# Patient Record
Sex: Female | Born: 1935 | Race: White | Hispanic: No
Health system: Southern US, Community
[De-identification: ages and names within clinical notes are randomized; demographics above are authoritative.]

## PROBLEM LIST (undated history)

## (undated) DIAGNOSIS — K449 Diaphragmatic hernia without obstruction or gangrene: Secondary | ICD-10-CM

## (undated) DIAGNOSIS — K219 Gastro-esophageal reflux disease without esophagitis: Secondary | ICD-10-CM

## (undated) DIAGNOSIS — I219 Acute myocardial infarction, unspecified: Secondary | ICD-10-CM

## (undated) DIAGNOSIS — R011 Cardiac murmur, unspecified: Secondary | ICD-10-CM

## (undated) DIAGNOSIS — I10 Essential (primary) hypertension: Secondary | ICD-10-CM

## (undated) DIAGNOSIS — N189 Chronic kidney disease, unspecified: Secondary | ICD-10-CM

## (undated) DIAGNOSIS — S8290XA Unspecified fracture of unspecified lower leg, initial encounter for closed fracture: Secondary | ICD-10-CM

## (undated) DIAGNOSIS — I639 Cerebral infarction, unspecified: Secondary | ICD-10-CM

## (undated) DIAGNOSIS — K269 Duodenal ulcer, unspecified as acute or chronic, without hemorrhage or perforation: Secondary | ICD-10-CM

## (undated) DIAGNOSIS — K920 Hematemesis: Secondary | ICD-10-CM

## (undated) DIAGNOSIS — K8050 Calculus of bile duct without cholangitis or cholecystitis without obstruction: Secondary | ICD-10-CM

## (undated) DIAGNOSIS — I350 Nonrheumatic aortic (valve) stenosis: Secondary | ICD-10-CM

## (undated) DIAGNOSIS — I071 Rheumatic tricuspid insufficiency: Secondary | ICD-10-CM

## (undated) DIAGNOSIS — I4891 Unspecified atrial fibrillation: Secondary | ICD-10-CM

## (undated) DIAGNOSIS — I340 Nonrheumatic mitral (valve) insufficiency: Secondary | ICD-10-CM

## (undated) HISTORY — DX: Gastro-esophageal reflux disease without esophagitis: K21.9

## (undated) HISTORY — DX: Nonrheumatic aortic (valve) stenosis: I35.0

## (undated) HISTORY — DX: Unspecified fracture of unspecified lower leg, initial encounter for closed fracture: S82.90XA

## (undated) HISTORY — PX: CORONARY ARTERY BYPASS GRAFT: SHX141

## (undated) HISTORY — PX: GALLBLADDER SURGERY: SHX652

## (undated) HISTORY — DX: Unspecified atrial fibrillation: I48.91

## (undated) HISTORY — DX: Nonrheumatic mitral (valve) insufficiency: I34.0

## (undated) HISTORY — PX: EYE SURGERY: SHX253

## (undated) HISTORY — DX: Rheumatic tricuspid insufficiency: I07.1

## (undated) HISTORY — PX: CHOLECYSTECTOMY: SHX55

## (undated) HISTORY — PX: FRACTURE SURGERY: SHX138

## (undated) HISTORY — PX: APPENDECTOMY: SHX54

## (undated) HISTORY — DX: Diaphragmatic hernia without obstruction or gangrene: K44.9

## (undated) HISTORY — PX: CORONARY ANGIOPLASTY: SHX604

## (undated) HISTORY — DX: Hematemesis: K92.0

## (undated) HISTORY — DX: Duodenal ulcer, unspecified as acute or chronic, without hemorrhage or perforation: K26.9

## (undated) HISTORY — DX: Essential (primary) hypertension: I10

## (undated) HISTORY — DX: Calculus of bile duct without cholangitis or cholecystitis without obstruction: K80.50

---

## 2010-12-24 ENCOUNTER — Ambulatory Visit: Payer: Self-pay | Admitting: Internal Medicine

## 2011-01-02 ENCOUNTER — Inpatient Hospital Stay: Payer: Self-pay | Admitting: Internal Medicine

## 2011-01-02 DIAGNOSIS — R0602 Shortness of breath: Secondary | ICD-10-CM

## 2011-01-02 DIAGNOSIS — I4891 Unspecified atrial fibrillation: Secondary | ICD-10-CM

## 2011-01-03 DIAGNOSIS — I359 Nonrheumatic aortic valve disorder, unspecified: Secondary | ICD-10-CM

## 2011-01-14 ENCOUNTER — Encounter: Payer: Self-pay | Admitting: Cardiovascular Disease

## 2011-01-14 ENCOUNTER — Ambulatory Visit (INDEPENDENT_AMBULATORY_CARE_PROVIDER_SITE_OTHER): Payer: Medicare Other | Admitting: Cardiovascular Disease

## 2011-01-14 VITALS — BP 129/81 | HR 93 | Ht 60.0 in | Wt 128.0 lb

## 2011-01-14 DIAGNOSIS — I10 Essential (primary) hypertension: Secondary | ICD-10-CM

## 2011-01-14 DIAGNOSIS — I350 Nonrheumatic aortic (valve) stenosis: Secondary | ICD-10-CM

## 2011-01-14 DIAGNOSIS — R609 Edema, unspecified: Secondary | ICD-10-CM

## 2011-01-14 DIAGNOSIS — I359 Nonrheumatic aortic valve disorder, unspecified: Secondary | ICD-10-CM

## 2011-01-14 DIAGNOSIS — I4891 Unspecified atrial fibrillation: Secondary | ICD-10-CM

## 2011-01-14 MED ORDER — FUROSEMIDE 40 MG PO TABS: 40.0000 mg | ORAL_TABLET | Freq: Every day | ORAL | Status: DC | PRN

## 2011-01-14 MED ORDER — METOPROLOL TARTRATE 25 MG PO TABS: 25.0000 mg | ORAL_TABLET | Freq: Two times a day (BID) | ORAL | Status: DC

## 2011-01-14 NOTE — Assessment & Plan Note (Signed)
Blood pressure is well controlled on today's visit. Metoprolol tartrate was added b.i.d. For heart rate control. We have suggested she watch her blood pressure at home.

## 2011-01-14 NOTE — Assessment & Plan Note (Signed)
Her aortic valve stenosis is probably be predominant cause of her recent atrial fibrillation. She reports that prior to the atrial fibrillation, she was relatively asymptomatic and felt well.

## 2011-01-14 NOTE — Assessment & Plan Note (Signed)
Mild edema is likely secondary to venous insufficiency. We will monitor this as we recently have started a calcium channel blocker which can make her symptoms worse.

## 2011-01-14 NOTE — Assessment & Plan Note (Signed)
Her rate continues to be elevated. We will add metoprolol tartrate 25 mg b.i.d.. We will hold the Lasix for weight less than 125 pounds as her weight continues to drop and she is now getting dizziness. Dizziness could be secondary to poor rate control. If her rate continues to be elevated, we could add low-dose digoxin. We will meet with her again in several weeks' time to discuss possible cardioversion or starting amiodarone to cardiovert.

## 2011-01-14 NOTE — Patient Instructions (Addendum)
Take only one furosemide. Hold the furosemide for weight less then 124 lbs Dont take potassium if you hold your furosemide/LASIX  Please start metoprolol tartrate twice a day Take aleve or advil for headache Try bendryl for sleep  Please call us if you have new issues that need to be addressed before your next appt.  The office will contact you for a follow up Appt. In 2 weeks

## 2011-01-14 NOTE — Progress Notes (Signed)
Patient ID: Dawn Cooper, female    DOB: [birth year], 77 y.o.   MRN: [id number]  HPI Comments: Dawn Cooper is a very pleasant 77 year old woman who presented to the hospital on October 10 with shortness of breath, found to be in atrial fibrillation with RVR, elevated BNP, CT scan showing bilateral moderate sized pleural effusions who was started on rate control and pradaxa Who presents  for follow up in the office.  She states that since her discharge, she feels well, no significant shortness of breath. She does feel palpitations, some dizziness when she walks. She has had fatigue and several pounds of weight loss. She denies any near syncope. She does not sleep well and she reports that this is probably why she is fatigued in the day. She is always had a problem with insomnia. She woke up last night at 3 AM and could not get back to sleep.  Echocardiogram showed ejection fraction 35-45%, normal left ventricular systolic pressures, moderate aortic valve stenosis with mean gradient of 32 mmHg   total cholesterol 132, LDL 82, HDL 40, triglycerides 502, hemoglobin A1c 4.9  CT Scan of the chest showing moderate bilateral pleural effusions with compressive atelectasis in both lower lobes, no PE  EKG shows atrial fibrillation with left bundle branch block, rate 96 beats per minute   Outpatient Encounter Prescriptions as of 01/14/2011  Medication Sig Dispense Refill  . dabigatran (PRADAXA) 150 MG CAPS Take 150 mg by mouth every 12 (twelve) hours.        Marland Kitchen diltiazem (TIAZAC) 120 MG 24 hr capsule Take 120 mg by mouth daily.        . furosemide (LASIX) 40 MG tablet Take 1 tablet (40 mg total) by mouth daily as needed.  30 tablet  6  . lisinopril (PRINIVIL,ZESTRIL) 5 MG tablet Take 5 mg by mouth daily.        . pantoprazole (PROTONIX) 40 MG tablet Take 40 mg by mouth daily.        . potassium chloride (KLOR-CON) 10 MEQ CR tablet Take 10 mEq by mouth daily.        .  furosemide (LASIX) 40 MG tablet Take  40 mg by mouth 2 (two) times daily.          Review of Systems  Constitutional: Positive for fatigue.  HENT: Negative.   Eyes: Negative.   Respiratory: Negative.   Cardiovascular: Positive for palpitations.  Gastrointestinal: Negative.   Musculoskeletal: Negative.   Skin: Negative.   Neurological: Positive for dizziness.       Insomnia  Hematological: Negative.   Psychiatric/Behavioral: Negative.   All other systems reviewed and are negative.    BP 129/81  Pulse 93  Ht 5' (1.524 m)  Wt 128 lb (58.06 kg)  BMI 25.00 kg/m2   Physical Exam  Nursing note and vitals reviewed. Constitutional: She is oriented to person, place, and time. She appears well-developed and well-nourished.  HENT:  Head: Normocephalic.  Nose: Nose normal.  Mouth/Throat: Oropharynx is clear and moist.  Eyes: Conjunctivae are normal. Pupils are equal, round, and reactive to light.  Neck: Normal range of motion. Neck supple. No JVD present.  Cardiovascular: S1 normal, S2 normal and intact distal pulses.  An irregularly irregular rhythm present. Tachycardia present.  Exam reveals no gallop and no friction rub.   Murmur heard.  Crescendo systolic murmur is present with a grade of 2/6  Pulmonary/Chest: Effort normal and breath sounds normal. No respiratory distress. She has no wheezes.  She has no rales. She exhibits no tenderness.  Abdominal: Soft. Bowel sounds are normal. She exhibits no distension. There is no tenderness.  Musculoskeletal: Normal range of motion. She exhibits edema. She exhibits no tenderness.       Trace edema to the low shins bilaterally  Lymphadenopathy:    She has no cervical adenopathy.  Neurological: She is alert and oriented to person, place, and time. Coordination normal.  Skin: Skin is warm and dry. No rash noted. No erythema.  Psychiatric: She has a normal mood and affect. Her behavior is normal. Judgment and thought content normal.         Assessment and Plan

## 2011-01-15 ENCOUNTER — Encounter: Payer: Self-pay | Admitting: Internal Medicine

## 2011-01-15 ENCOUNTER — Ambulatory Visit (INDEPENDENT_AMBULATORY_CARE_PROVIDER_SITE_OTHER): Payer: Medicare Other | Admitting: Internal Medicine

## 2011-01-15 VITALS — BP 136/82 | HR 79 | Temp 97.8°F | Resp 12 | Ht 61.0 in | Wt 130.0 lb

## 2011-01-15 DIAGNOSIS — M2550 Pain in unspecified joint: Secondary | ICD-10-CM

## 2011-01-15 DIAGNOSIS — I4891 Unspecified atrial fibrillation: Secondary | ICD-10-CM

## 2011-01-15 DIAGNOSIS — I10 Essential (primary) hypertension: Secondary | ICD-10-CM

## 2011-01-15 NOTE — Assessment & Plan Note (Signed)
Heart rate is improved today compared with check yesterday at Dr. Windell Hummingbird office. Likely secondary to addition of metoprolol. She is anticoagulated with Pradaxa (additional samples given today).  Plan for her to follow up with Dr. Mariah Milling in 2 weeks. Per his note, may discuss cardioversion or amiodarone.  Pt will call if any worsening palpitations, dyspnea, or fatigue prior to that appointment. Will plan to have her follow up here in 2 months.

## 2011-01-15 NOTE — Progress Notes (Signed)
Subjective:    Patient ID: Dawn Cooper, female    DOB: 1918-07-30, 77 y.o.   MRN: [id number]  HPI 77 year old female presents to establish care. She reports that she has not had a primary care physician in the past. She was recently admitted to [facility] with atrial fibrillation and RVR. She was evaluated by cardiology and was started on several medications to help control her heart rate. She reports that since that admission she has had significant fatigue. She notes that her fatigue seems better this morning. She reports some mild shortness of breath particularly on exertion. She continues to note palpitations. She denies any chest pain.  She complains of chronic arthritic pain in her back. She reports that she sometimes takes Tylenol for this with minimal improvement. She questions if there are other pain medications that she might be able to take.  Outpatient Encounter Prescriptions as of 01/15/2011  Medication Sig Dispense Refill  . dabigatran (PRADAXA) 150 MG CAPS Take 150 mg by mouth every 12 (twelve) hours.        Marland Kitchen diltiazem (CARDIZEM CD) 120 MG 24 hr capsule Take 120 mg by mouth daily.       . furosemide (LASIX) 40 MG tablet Take 1 tablet (40 mg total) by mouth daily as needed.  30 tablet  6  . KLOR-CON M10 10 MEQ tablet Take 10 mEq by mouth daily.       Marland Kitchen lisinopril (PRINIVIL,ZESTRIL) 5 MG tablet Take 5 mg by mouth daily.        . metoprolol tartrate (LOPRESSOR) 25 MG tablet Take 1 tablet (25 mg total) by mouth 2 (two) times daily.  60 tablet  11  . pantoprazole (PROTONIX) 40 MG tablet Take 40 mg by mouth daily.        . potassium chloride (KLOR-CON) 10 MEQ CR tablet Take 10 mEq by mouth daily.           Review of Systems  Constitutional: Positive for fatigue (improved today). Negative for fever, chills, appetite change and unexpected weight change.  HENT: Negative for ear pain, congestion, sore throat, trouble swallowing, neck pain, voice change and  sinus pressure.   Eyes: Negative for visual disturbance.  Respiratory: Positive for shortness of breath. Negative for cough, wheezing and stridor.   Cardiovascular: Positive for palpitations. Negative for chest pain and leg swelling.  Gastrointestinal: Negative for nausea, vomiting, abdominal pain, diarrhea, constipation, blood in stool, abdominal distention and anal bleeding.  Genitourinary: Negative for dysuria and flank pain.  Musculoskeletal: Positive for myalgias, back pain and arthralgias. Negative for gait problem.  Skin: Negative for color change and rash.  Neurological: Negative for dizziness and headaches.  Hematological: Negative for adenopathy. Does not bruise/bleed easily.  Psychiatric/Behavioral: Negative for suicidal ideas, sleep disturbance and dysphoric mood. The patient is not nervous/anxious.    BP 136/82  Pulse 79  Temp(Src) 97.8 F (36.6 C) (Oral)  Resp 12  Ht 5\' 1"  (1.549 m)  Wt 130 lb (58.968 kg)  BMI 24.56 kg/m2  SpO2 97%     Objective:   Physical Exam  Constitutional: She is oriented to person, place, and time. She appears well-developed and well-nourished. No distress.  HENT:  Head: Normocephalic and atraumatic.  Right Ear: External ear normal.  Left Ear: External ear normal.  Nose: Nose normal.  Mouth/Throat: Oropharynx is clear and moist. No oropharyngeal exudate.  Eyes: Conjunctivae are normal. Pupils are equal, round, and reactive to light. Right eye exhibits no discharge. Left  eye exhibits no discharge. No scleral icterus.  Neck: Normal range of motion. Neck supple. No tracheal deviation present. No thyromegaly present.  Cardiovascular: Normal rate and intact distal pulses.  An irregularly irregular rhythm present. Exam reveals no gallop and no friction rub.   Murmur heard.  Systolic murmur is present with a grade of 2/6  Pulmonary/Chest: Effort normal and breath sounds normal. No respiratory distress. She has no wheezes. She has no rales. She  exhibits no tenderness.  Musculoskeletal: Normal range of motion. She exhibits edema (trace to mid lower leg). She exhibits no tenderness.  Lymphadenopathy:    She has no cervical adenopathy.  Neurological: She is alert and oriented to person, place, and time. No cranial nerve deficit. She exhibits normal muscle tone. Coordination normal.  Skin: Skin is warm and dry. No rash noted. She is not diaphoretic. No erythema. No pallor.  Psychiatric: She has a normal mood and affect. Her behavior is normal. Judgment and thought content normal.          Assessment & Plan:

## 2011-01-15 NOTE — Assessment & Plan Note (Signed)
Secondary to osteoarthritis. We discussed use of Vicodin for severe pain, but pt would like to hold off for now given risk of sedation with this medication. We discussed avoidance of nonsteroidal drugs given her use of Pradaxa. She will continue to use Tylenol as needed. She will call or return to clinic if symptoms are worsening.

## 2011-01-15 NOTE — Assessment & Plan Note (Signed)
BP well controlled today. Will continue current medications.

## 2011-01-24 ENCOUNTER — Ambulatory Visit: Payer: Self-pay | Admitting: Internal Medicine

## 2011-01-31 ENCOUNTER — Encounter: Payer: Self-pay | Admitting: Cardiovascular Disease

## 2011-01-31 ENCOUNTER — Ambulatory Visit (INDEPENDENT_AMBULATORY_CARE_PROVIDER_SITE_OTHER): Payer: Medicare Other | Admitting: Cardiovascular Disease

## 2011-01-31 DIAGNOSIS — R0602 Shortness of breath: Secondary | ICD-10-CM

## 2011-01-31 DIAGNOSIS — I4891 Unspecified atrial fibrillation: Secondary | ICD-10-CM

## 2011-01-31 DIAGNOSIS — R079 Chest pain, unspecified: Secondary | ICD-10-CM

## 2011-01-31 DIAGNOSIS — R609 Edema, unspecified: Secondary | ICD-10-CM

## 2011-01-31 DIAGNOSIS — I359 Nonrheumatic aortic valve disorder, unspecified: Secondary | ICD-10-CM

## 2011-01-31 DIAGNOSIS — I10 Essential (primary) hypertension: Secondary | ICD-10-CM

## 2011-01-31 DIAGNOSIS — I350 Nonrheumatic aortic (valve) stenosis: Secondary | ICD-10-CM

## 2011-01-31 NOTE — Assessment & Plan Note (Signed)
Moderate aortic valve stenosis on echocardiogram. We will order a repeat echocardiogram next year.

## 2011-01-31 NOTE — Progress Notes (Signed)
Patient ID: Dawn Cooper, female    DOB: Oct 24, 1918, 77 y.o.   MRN: [id number]  HPI Comments: Ms. Malbrough is a very pleasant 77 year old woman who presented to the hospital on January 02 2011 with shortness of breath, found to be in atrial fibrillation with RVR, elevated BNP, CT scan showing bilateral moderate sized pleural effusions who was started on rate control and pradaxa. She presents for routine followup.  She has now been on anticoagulation for 4 weeks. She currently does not have any symptoms of shortness of breath, though she does have some staggering or a drunk-like feeling. Her balance is not as good as it used to be. She is uncertain if she is deconditioned or if it is one of the medications. She denies any cough, she does sleep well and has no other complaints. No significant bruising.  She has had pain in her left flank for the past night and through this morning. It hurts with movement.  Echocardiogram While she was in atrial fibrillation showed ejection fraction 35-45%, normal left ventricular systolic pressures, moderate aortic valve stenosis with mean gradient of 32 mmHg   total cholesterol 132, LDL 82, HDL 40, triglycerides 502, hemoglobin A1c 4.9  CT Scan of the chest showing moderate bilateral pleural effusions with compressive atelectasis in both lower lobes, no PE  EKG shows atrial fibrillation with left bundle branch block, rate 59 beats per minute   Outpatient Encounter Prescriptions as of 01/31/2011  Medication Sig Dispense Refill  . dabigatran (PRADAXA) 150 MG CAPS Take 150 mg by mouth every 12 (twelve) hours.        Marland Kitchen diltiazem (CARDIZEM CD) 120 MG 24 hr capsule Take 120 mg by mouth daily.       . furosemide (LASIX) 40 MG tablet Take 40 mg by mouth daily.        Marland Kitchen KLOR-CON M10 10 MEQ tablet Take 10 mEq by mouth daily.       Marland Kitchen lisinopril (PRINIVIL,ZESTRIL) 5 MG tablet Take 5 mg by mouth daily.        . metoprolol tartrate (LOPRESSOR) 25 MG tablet Take 1 tablet  (25 mg total) by mouth 2 (two) times daily.  60 tablet  11  . pantoprazole (PROTONIX) 40 MG tablet Take 40 mg by mouth daily.        .  potassium chloride (KLOR-CON) 10 MEQ CR tablet Take 10 mEq by mouth daily.          Review of Systems  HENT: Negative.   Eyes: Negative.   Respiratory: Negative.   Gastrointestinal: Negative.   Musculoskeletal: Positive for gait problem.       Left flank pain  Skin: Negative.   Neurological:       Insomnia  Hematological: Negative.   Psychiatric/Behavioral: Negative.   All other systems reviewed and are negative.    BP 128/82  Pulse 59  Ht 5\' 1"  (1.549 m)  Wt 132 lb (59.875 kg)  BMI 24.94 kg/m2   Physical Exam  Nursing note and vitals reviewed. Constitutional: She is oriented to person, place, and time. She appears well-developed and well-nourished.  HENT:  Head: Normocephalic.  Nose: Nose normal.  Mouth/Throat: Oropharynx is clear and moist.  Eyes: Conjunctivae are normal. Pupils are equal, round, and reactive to light.  Neck: Normal range of motion. Neck supple. No JVD present.  Cardiovascular: S1 normal, S2 normal and intact distal pulses.  An irregularly irregular rhythm present. Tachycardia present.  Exam reveals no gallop and no  friction rub.   Murmur heard.  Crescendo systolic murmur is present with a grade of 2/6  Pulmonary/Chest: Effort normal and breath sounds normal. No respiratory distress. She has no wheezes. She has no rales. She exhibits no tenderness.  Abdominal: Soft. Bowel sounds are normal. She exhibits no distension. There is no tenderness.  Musculoskeletal: Normal range of motion. She exhibits no edema and no tenderness.       Trace edema to the low shins bilaterally  Lymphadenopathy:    She has no cervical adenopathy.  Neurological: She is alert and oriented to person, place, and time. Coordination normal.  Skin: Skin is warm and dry. No rash noted. No erythema.  Psychiatric: She has a normal mood and affect. Her  behavior is normal. Judgment and thought content normal.         Assessment and Plan

## 2011-01-31 NOTE — Patient Instructions (Addendum)
You are doing well. Please decrease lasix to every other day. Take extra lasix for edema or shortness of breath, or weight gain.  Please cut the metoprolol in 1/2 and take twice a day  Please call us if you have new issues that need to be addressed before your next appt.  The office will contact you for a follow up Appt. In 6 months

## 2011-01-31 NOTE — Assessment & Plan Note (Signed)
Blood pressure is well controlled on today's visit. No changes made to the medications. 

## 2011-01-31 NOTE — Assessment & Plan Note (Signed)
We have had a long discussion with her concerning her various treatment options for atrial fibrillation. We have discussed that she could at this time have a cardioversion. She is reluctant to proceed with this as she does feel well with no complaints. We did discuss that she could have this at a later date if she decided that she would like to go through with the procedure in the hospital though there was no guarantee that she would hold in normal sinus rhythm. She might need other antiarrhythmic medications.  We did suggest that if she would like, she could remain in atrial fibrillation as she was asymptomatic. Her rate was relatively well controlled though given her unbalanced gait, we would decrease the metoprolol to one half tab b.i.d..  We did discuss that she could change to warfarin if she would like. She did not want to make any changes at this time.

## 2011-01-31 NOTE — Assessment & Plan Note (Signed)
Edema has improved with improved rate control. I am concerned that she could become dehydrated and we have suggested she take her Lasix every other day.

## 2011-02-01 ENCOUNTER — Other Ambulatory Visit: Payer: Self-pay | Admitting: Cardiovascular Disease

## 2011-02-01 MED ORDER — LISINOPRIL 5 MG PO TABS: 5.0000 mg | ORAL_TABLET | Freq: Every day | ORAL | Status: DC

## 2011-02-01 MED ORDER — DILTIAZEM HCL ER COATED BEADS 120 MG PO CP24: 120.0000 mg | ORAL_CAPSULE | Freq: Every day | ORAL | Status: DC

## 2011-02-01 NOTE — Telephone Encounter (Signed)
NEEDS REFILL CALLED INTO [facility], IF ANY PROBLEMS PLEASE CALL HER DAUGHTER SANDY KING AT [phone]

## 2011-02-01 NOTE — Telephone Encounter (Signed)
Refill sent for lisinopril & diltiazem.

## 2011-02-11 ENCOUNTER — Inpatient Hospital Stay: Payer: Self-pay | Admitting: Internal Medicine

## 2011-02-11 ENCOUNTER — Telehealth: Payer: Self-pay | Admitting: Cardiovascular Disease

## 2011-02-11 NOTE — Telephone Encounter (Signed)
Pt daughter calling stating that she has some black stool. Pt very week. Back hurting. Not eating much. Wanted mother to be seen today explained that he was not here today. Took no meds last night or the am

## 2011-02-11 NOTE — Telephone Encounter (Signed)
Spoke to pt's daughter, Vonna Drafts, she states pt has had black stool for about 1 week now, her BP last night was 86/45, she called EMS but they did not know about her stool and that she was on Pradaxa, per daughter. BP this AM is 100/62, pt c/o pain across middle of back. Pt takes Pradaxa 150 BID, she has not taken AM dose. I told her to hold am dose as well as BP meds, and take mother to ER now, as she needs to be evaluated for GI bleed. Daughter states mother is stable at this time, so she will take her to ER now. Will notify Dr. Mariah Milling.

## 2011-02-12 DIAGNOSIS — Z7901 Long term (current) use of anticoagulants: Secondary | ICD-10-CM

## 2011-02-12 DIAGNOSIS — I4891 Unspecified atrial fibrillation: Secondary | ICD-10-CM

## 2011-02-21 ENCOUNTER — Ambulatory Visit (INDEPENDENT_AMBULATORY_CARE_PROVIDER_SITE_OTHER): Payer: Medicare Other | Admitting: Internal Medicine

## 2011-02-21 ENCOUNTER — Encounter: Payer: Self-pay | Admitting: Internal Medicine

## 2011-02-21 ENCOUNTER — Telehealth: Payer: Self-pay | Admitting: *Deleted

## 2011-02-21 VITALS — BP 132/60 | HR 60 | Temp 97.6°F | Wt 134.0 lb

## 2011-02-21 DIAGNOSIS — R5383 Other fatigue: Secondary | ICD-10-CM

## 2011-02-21 DIAGNOSIS — D510 Vitamin B12 deficiency anemia due to intrinsic factor deficiency: Secondary | ICD-10-CM

## 2011-02-21 DIAGNOSIS — I4891 Unspecified atrial fibrillation: Secondary | ICD-10-CM

## 2011-02-21 DIAGNOSIS — K922 Gastrointestinal hemorrhage, unspecified: Secondary | ICD-10-CM

## 2011-02-21 DIAGNOSIS — E039 Hypothyroidism, unspecified: Secondary | ICD-10-CM

## 2011-02-21 LAB — VITAMIN B12: Vitamin B-12: 111 pg/mL — ABNORMAL LOW (ref 211–911)

## 2011-02-21 LAB — CBC WITH DIFFERENTIAL/PLATELET
Basophils Relative: 0.5 % (ref 0.0–3.0)
Eosinophils Relative: 2.4 % (ref 0.0–5.0)
HCT: 30.5 % — ABNORMAL LOW (ref 36.0–46.0)
Hemoglobin: 10.5 g/dL — ABNORMAL LOW (ref 12.0–15.0)
Lymphs Abs: 1.6 10*3/uL (ref 0.7–4.0)
MCV: 92.3 fl (ref 78.0–100.0)
Monocytes Absolute: 0.5 10*3/uL (ref 0.1–1.0)
Neutro Abs: 2.9 10*3/uL (ref 1.4–7.7)
Platelets: 222 10*3/uL (ref 150.0–400.0)
WBC: 5.2 10*3/uL (ref 4.5–10.5)

## 2011-02-21 LAB — COMPREHENSIVE METABOLIC PANEL
Albumin: 3.5 g/dL (ref 3.5–5.2)
BUN: 18 mg/dL (ref 6–23)
CO2: 26 mEq/L (ref 19–32)
Calcium: 9.6 mg/dL (ref 8.4–10.5)
Chloride: 111 mEq/L (ref 96–112)
Creatinine, Ser: 1.2 mg/dL (ref 0.4–1.2)
GFR: 43.98 mL/min — ABNORMAL LOW (ref >60.00)
Glucose, Bld: 89 mg/dL (ref 70–99)
Potassium: 4.4 mEq/L (ref 3.5–5.1)

## 2011-02-21 MED ORDER — POTASSIUM CHLORIDE CRYS ER 10 MEQ PO TBCR: 10.0000 meq | EXTENDED_RELEASE_TABLET | ORAL | Status: DC

## 2011-02-21 NOTE — Telephone Encounter (Signed)
Caregiver wanted to confirm if pt needed to continue taking protonix bid per hospital d/c instructions. Dr Dan Humphreys agrees that she should and keep f/u w/GI. Caregiver informed. Also needed RF of lasix, done

## 2011-02-21 NOTE — Progress Notes (Signed)
Subjective:    Patient ID: Dawn Cooper, female    DOB: 1918-05-06, 77 y.o.   MRN: [id number]  HPI 77 year old female with a history of atrial fibrillation presents for followup after recent admission for upper GI bleeding. Patient brings discharge summary with her which shows that she was admitted for upper GI bleed secondary to AV malformation.Pradaxa was stopped during that admission. She was started on Protonix twice daily. She was also started on sucralfate. She reports that she has felt fatigued since her discharge from the hospital last Friday. She denies any black or bloody stools. She denies any nausea or vomiting. She denies any chest pain or dyspnea. She reports full compliance with her medications.  Outpatient Encounter Prescriptions as of 02/21/2011  Medication Sig Dispense Refill  . diltiazem (CARDIZEM CD) 120 MG 24 hr capsule Take 1 capsule (120 mg total) by mouth daily.  30 capsule  6  . furosemide (LASIX) 40 MG tablet Take 40 mg by mouth every other day.       Marland Kitchen KLOR-CON M10 10 MEQ tablet Take 10 mEq by mouth every other day.       . lisinopril (PRINIVIL,ZESTRIL) 5 MG tablet Take 1 tablet (5 mg total) by mouth daily.  30 tablet  6  . metoprolol tartrate (LOPRESSOR) 25 MG tablet Take 12.5 mg by mouth 2 (two) times daily.        . pantoprazole (PROTONIX) 40 MG tablet Take 40 mg by mouth daily.        . sucralfate (CARAFATE) 1 GM/10ML suspension Take 1 g by mouth 4 (four) times daily -  before meals and at bedtime.          Review of Systems  Constitutional: Positive for fatigue. Negative for fever, chills, appetite change and unexpected weight change.  HENT: Negative for ear pain, congestion, sore throat, trouble swallowing, neck pain, voice change and sinus pressure.   Eyes: Negative for visual disturbance.  Respiratory: Negative for cough, shortness of breath, wheezing and stridor.   Cardiovascular: Negative for chest pain, palpitations and leg swelling.  Gastrointestinal:  Negative for nausea, vomiting, abdominal pain, diarrhea, constipation, blood in stool, abdominal distention and anal bleeding.  Genitourinary: Negative for dysuria and flank pain.  Musculoskeletal: Negative for myalgias, arthralgias and gait problem.  Skin: Negative for color change and rash.  Neurological: Negative for dizziness and headaches.  Hematological: Negative for adenopathy. Does not bruise/bleed easily.  Psychiatric/Behavioral: Negative for suicidal ideas, sleep disturbance and dysphoric mood. The patient is not nervous/anxious.    BP 132/60  Pulse 60  Temp(Src) 97.6 F (36.4 C) (Oral)  Wt 134 lb (60.782 kg)  SpO2 98%     Objective:   Physical Exam  Constitutional: She is oriented to person, place, and time. She appears well-developed and well-nourished. No distress.  HENT:  Head: Normocephalic and atraumatic.  Right Ear: External ear normal.  Left Ear: External ear normal.  Nose: Nose normal.  Mouth/Throat: Oropharynx is clear and moist. No oropharyngeal exudate.  Eyes: Conjunctivae are normal. Pupils are equal, round, and reactive to light. Right eye exhibits no discharge. Left eye exhibits no discharge. No scleral icterus.  Neck: Normal range of motion. Neck supple. No tracheal deviation present. No thyromegaly present.  Cardiovascular: Normal rate and intact distal pulses.  An irregularly irregular rhythm present. Exam reveals no gallop and no friction rub.   Murmur heard.  Systolic murmur is present with a grade of 2/6  Pulmonary/Chest: Effort normal and breath sounds normal.  No respiratory distress. She has no wheezes. She has no rales. She exhibits no tenderness.  Abdominal: Soft. She exhibits no distension and no mass. There is tenderness (left upper quadrant). There is no rebound and no guarding.  Musculoskeletal: Normal range of motion. She exhibits no edema and no tenderness.  Lymphadenopathy:    She has no cervical adenopathy.  Neurological: She is alert and  oriented to person, place, and time. No cranial nerve deficit. She exhibits normal muscle tone. Coordination normal.  Skin: Skin is warm and dry. No rash noted. She is not diaphoretic. No erythema. There is pallor.  Psychiatric: She has a normal mood and affect. Her behavior is normal. Judgment and thought content normal.          Assessment & Plan:  1. Upper GI bleeding - S/p recent admission. Will request records for review. Will continue Protonix bid. Will check CBC and ferritin with labs today given fatigue. She will call immediately if any black or bloody stools or nausea/vomiting.  2. Fatigue -likely multifactorial. Question whether she may still be anemic. As above, will check CBC and ferritin with labs. We'll also check TSH and B12. She has followup with Dr. Mariah Milling next week. She also has followup with her GI physician in 2 weeks. We will see her back in 3 weeks.  3. Atrial fibrillation - No further anticoagulation given recent UGI bleeding. Rate well controlled with metoprolol. Has follow up with Dr. Mariah Milling next week.

## 2011-02-22 LAB — COMPREHENSIVE METABOLIC PANEL
ALT: 40 U/L — ABNORMAL HIGH (ref 0–35)
AST: 51 U/L — ABNORMAL HIGH (ref 0–37)
Albumin: 3.5 g/dL (ref 3.5–5.2)
BUN: 17 mg/dL (ref 6–23)
Calcium: 9.7 mg/dL (ref 8.4–10.5)
Chloride: 109 mEq/L (ref 96–112)
Potassium: 4.5 mEq/L (ref 3.5–5.1)
Sodium: 143 mEq/L (ref 135–145)
Total Protein: 6.9 g/dL (ref 6.0–8.3)

## 2011-02-25 ENCOUNTER — Encounter: Payer: Self-pay | Admitting: Cardiovascular Disease

## 2011-02-25 ENCOUNTER — Ambulatory Visit (INDEPENDENT_AMBULATORY_CARE_PROVIDER_SITE_OTHER): Payer: Medicare Other | Admitting: Cardiovascular Disease

## 2011-02-25 VITALS — BP 122/68 | HR 66 | Ht 61.0 in | Wt 135.2 lb

## 2011-02-25 DIAGNOSIS — K922 Gastrointestinal hemorrhage, unspecified: Secondary | ICD-10-CM

## 2011-02-25 DIAGNOSIS — I10 Essential (primary) hypertension: Secondary | ICD-10-CM

## 2011-02-25 DIAGNOSIS — R609 Edema, unspecified: Secondary | ICD-10-CM

## 2011-02-25 DIAGNOSIS — I4891 Unspecified atrial fibrillation: Secondary | ICD-10-CM

## 2011-02-25 DIAGNOSIS — I350 Nonrheumatic aortic (valve) stenosis: Secondary | ICD-10-CM

## 2011-02-25 DIAGNOSIS — I359 Nonrheumatic aortic valve disorder, unspecified: Secondary | ICD-10-CM

## 2011-02-25 MED ORDER — METOPROLOL TARTRATE 50 MG PO TABS: 50.0000 mg | ORAL_TABLET | Freq: Two times a day (BID) | ORAL | Status: DC

## 2011-02-25 NOTE — Assessment & Plan Note (Signed)
Atrial fibrillation rate is well controlled. She does report having significant edema which I suspect is from the Cardizem. We will stop the Cardizem and increase the metoprolol to 50 mg b.i.d.. We have suggested she monitor her blood pressure and heart rate at home.

## 2011-02-25 NOTE — Assessment & Plan Note (Signed)
I suspect her edema could be in part from the calcium channel blocker. We will hold this and increase the metoprolol and watch her blood pressure

## 2011-02-25 NOTE — Progress Notes (Signed)
Patient ID: Dawn Cooper, female    DOB: 01/04/1919, 77 y.o.   MRN: [id number]  HPI Comments: Dawn Cooper is a very pleasant 77 year old woman With moderate aortic valve stenosis,  who presented to the hospital on January 02 2011 with shortness of breath, found to be in atrial fibrillation with RVR, elevated BNP, CT scan showing bilateral moderate sized pleural effusions who was started on rate control and pradaxa. She refused cardioversion after 4 weeks on anticoagulation. At approximately 6 weeks out, she had a AVM bleed and presented to the hospital with melena and hematocrit of 18. She had been having melena for at least one week and did not go to the hospital.  She had EGD with treatment of her AVM and now presents to the office for routine followup. All anticoagulation was held.   We had discussed the case with Dr. Marva Panda and it was felt that given her risk of further bleeding from additional AVMs and given her age and the extent of her recent bleed, that no further anticoagulation be used. She has followup with Dr. Dan Humphreys and GI later this week.   She  Reports feeling somewhat woozy on occasion after she has been walking. Not usually when she stands up or when sitting. She has not been checking her heart rate or blood pressure at home and she does not know how to use the blood pressure cuff. Otherwise she has no complaints. She does report having edema both legs which is somewhat bothersome to her. Most recent hematocrit is 30.  Echocardiogram While she was in atrial fibrillation showed ejection fraction 35-45%, normal left ventricular systolic pressures, moderate aortic valve stenosis with mean gradient of 32 mmHg   total cholesterol 132, LDL 82, HDL 40, triglycerides 502, hemoglobin A1c 4.9  CT Scan of the chest showing moderate bilateral pleural effusions with compressive atelectasis in both lower lobes, no PE  EKG shows atrial fibrillation with left bundle branch block, rate 66 beats  per minute   Outpatient Encounter Prescriptions as of 02/25/2011  Medication Sig Dispense Refill  . furosemide (LASIX) 40 MG tablet Take 40 mg by mouth every other day.       . lisinopril (PRINIVIL,ZESTRIL) 5 MG tablet Take 1 tablet (5 mg total) by mouth daily.  30 tablet  6  . pantoprazole (PROTONIX) 40 MG tablet Take 40 mg by mouth 2 (two) times daily.       . potassium chloride (KLOR-CON M10) 10 MEQ tablet Take 1 tablet (10 mEq total) by mouth every other day.  45 tablet  1  . sucralfate (CARAFATE) 1 GM/10ML suspension Take 1 g by mouth 4 (four) times daily -  before meals and at bedtime.        Marland Kitchen  diltiazem (CARDIZEM CD) 120 MG 24 hr capsule Take 1 capsule (120 mg total) by mouth daily.  30 capsule  6  .  metoprolol tartrate (LOPRESSOR) 25 MG tablet Take 12.5 mg by mouth 2 (two) times daily.           Review of Systems  Constitutional: Negative.   HENT: Negative.   Eyes: Negative.   Respiratory: Negative.   Cardiovascular: Negative.   Gastrointestinal: Negative.   Musculoskeletal: Positive for gait problem.       Left flank pain  Skin: Negative.   Neurological: Negative.        Occasionally has symptoms of "wooziness" when she walks  Hematological: Negative.   Psychiatric/Behavioral: Negative.   All other systems  reviewed and are negative.    BP 122/68  Pulse 66  Ht 5\' 1"  (1.549 m)  Wt 135 lb 4 oz (61.349 kg)  BMI 25.56 kg/m2   Physical Exam  Nursing note and vitals reviewed. Constitutional: She is oriented to person, place, and time. She appears well-developed and well-nourished.  HENT:  Head: Normocephalic.  Nose: Nose normal.  Mouth/Throat: Oropharynx is clear and moist.  Eyes: Conjunctivae are normal. Pupils are equal, round, and reactive to light.  Neck: Normal range of motion. Neck supple. No JVD present.  Cardiovascular: S1 normal, S2 normal and intact distal pulses.  An irregularly irregular rhythm present. Exam reveals no gallop and no friction rub.     Murmur heard.  Crescendo systolic murmur is present with a grade of 2/6  Pulmonary/Chest: Effort normal and breath sounds normal. No respiratory distress. She has no wheezes. She has no rales. She exhibits no tenderness.  Abdominal: Soft. Bowel sounds are normal. She exhibits no distension. There is no tenderness.  Musculoskeletal: Normal range of motion. She exhibits edema. She exhibits no tenderness.       Trace edema to the low shins bilaterally  Lymphadenopathy:    She has no cervical adenopathy.  Neurological: She is alert and oriented to person, place, and time. Coordination normal.  Skin: Skin is warm and dry. No rash noted. No erythema.  Psychiatric: She has a normal mood and affect. Her behavior is normal. Judgment and thought content normal.         Assessment and Plan

## 2011-02-25 NOTE — Assessment & Plan Note (Signed)
Moderate aortic valve stenosis is likely contributor to her atrial fibrillation. She does not appear to be very symptomatic. No further workup at this time. Would probably recommend an echocardiogram next year 2013.

## 2011-02-25 NOTE — Patient Instructions (Addendum)
Please hold the diltiazem  Increase the metoprolol to 50 mg twice a day  Please monitor your  pressure and heart rate at home  Please call us if you have new issues that need to be addressed before your next appt.  The office will contact you for a follow up Appt. In 2 months

## 2011-02-25 NOTE — Assessment & Plan Note (Addendum)
We will hold anticoagulation given her significant AVM bleed. She is aware of the risk and benefit of not being on anticoagulation. She is worried about a head bleed if she resumes anticoagulation.

## 2011-02-25 NOTE — Assessment & Plan Note (Signed)
Blood pressure is well-controlled. We will monitor this after the above medication changes.

## 2011-02-26 ENCOUNTER — Ambulatory Visit (INDEPENDENT_AMBULATORY_CARE_PROVIDER_SITE_OTHER): Payer: Medicare Other | Admitting: *Deleted

## 2011-02-26 DIAGNOSIS — E538 Deficiency of other specified B group vitamins: Secondary | ICD-10-CM

## 2011-02-26 MED ORDER — CYANOCOBALAMIN 1000 MCG/ML IJ SOLN
1000.0000 ug | Freq: Once | INTRAMUSCULAR | Status: AC
  Administered 2011-02-26: 1000 ug via INTRAMUSCULAR

## 2011-02-28 ENCOUNTER — Telehealth: Payer: Self-pay | Admitting: Internal Medicine

## 2011-02-28 DIAGNOSIS — D649 Anemia, unspecified: Secondary | ICD-10-CM

## 2011-02-28 NOTE — Telephone Encounter (Signed)
Needs to come in and have labs drawn either this week or next.

## 2011-02-28 NOTE — Telephone Encounter (Signed)
Spoke w/pt - she is already scheduled for tomorrow

## 2011-03-01 ENCOUNTER — Other Ambulatory Visit (INDEPENDENT_AMBULATORY_CARE_PROVIDER_SITE_OTHER): Payer: Medicare Other | Admitting: *Deleted

## 2011-03-01 DIAGNOSIS — D649 Anemia, unspecified: Secondary | ICD-10-CM

## 2011-03-01 LAB — CBC WITH DIFFERENTIAL/PLATELET
Basophils Relative: 0.4 % (ref 0.0–3.0)
Eosinophils Relative: 3.2 % (ref 0.0–5.0)
HCT: 31 % — ABNORMAL LOW (ref 36.0–46.0)
Lymphs Abs: 1.9 10*3/uL (ref 0.7–4.0)
MCV: 93.3 fl (ref 78.0–100.0)
Monocytes Absolute: 0.4 10*3/uL (ref 0.1–1.0)
Monocytes Relative: 7.8 % (ref 3.0–12.0)
Neutrophils Relative %: 50.9 % (ref 43.0–77.0)
RBC: 3.32 Mil/uL — ABNORMAL LOW (ref 3.87–5.11)
WBC: 5 10*3/uL (ref 4.5–10.5)

## 2011-03-02 LAB — IRON AND TIBC: %SAT: 22 % (ref 20–55)

## 2011-03-05 ENCOUNTER — Ambulatory Visit (INDEPENDENT_AMBULATORY_CARE_PROVIDER_SITE_OTHER): Payer: Medicare Other | Admitting: *Deleted

## 2011-03-05 DIAGNOSIS — E538 Deficiency of other specified B group vitamins: Secondary | ICD-10-CM

## 2011-03-05 MED ORDER — CYANOCOBALAMIN 1000 MCG/ML IJ SOLN
1000.0000 ug | Freq: Once | INTRAMUSCULAR | Status: AC
  Administered 2011-03-05: 1000 ug via INTRAMUSCULAR

## 2011-03-13 ENCOUNTER — Encounter: Payer: Self-pay | Admitting: Internal Medicine

## 2011-03-13 ENCOUNTER — Ambulatory Visit (INDEPENDENT_AMBULATORY_CARE_PROVIDER_SITE_OTHER): Payer: Medicare Other | Admitting: Internal Medicine

## 2011-03-13 VITALS — BP 112/74 | HR 68 | Temp 98.1°F | Resp 14 | Ht 60.0 in | Wt 139.0 lb

## 2011-03-13 DIAGNOSIS — I4891 Unspecified atrial fibrillation: Secondary | ICD-10-CM

## 2011-03-13 DIAGNOSIS — D510 Vitamin B12 deficiency anemia due to intrinsic factor deficiency: Secondary | ICD-10-CM | POA: Insufficient documentation

## 2011-03-13 DIAGNOSIS — K922 Gastrointestinal hemorrhage, unspecified: Secondary | ICD-10-CM

## 2011-03-13 DIAGNOSIS — R609 Edema, unspecified: Secondary | ICD-10-CM

## 2011-03-13 LAB — BASIC METABOLIC PANEL
CO2: 28 mEq/L (ref 19–32)
Calcium: 9.3 mg/dL (ref 8.4–10.5)
Chloride: 110 mEq/L (ref 96–112)
Glucose, Bld: 88 mg/dL (ref 70–99)
Sodium: 143 mEq/L (ref 135–145)

## 2011-03-13 MED ORDER — FUROSEMIDE 20 MG PO TABS: 20.0000 mg | ORAL_TABLET | ORAL | Status: DC

## 2011-03-13 NOTE — Progress Notes (Signed)
Subjective:    Patient ID: Dawn Cooper, female    DOB: 1918-04-21, 77 y.o.   MRN: [id number]  HPI 77YO female with atrial fibrillation and recent AVM with hemorrhage presents for follow up. Overall doing well. Notes some persistent fatigue. Denies dyspnea, palpitations, or chest pain. Notes persistent lower extremity edema despite d/c cardizem.  No pain in LE. No further GI bleeding or black stools. No N/V/D.  Outpatient Encounter Prescriptions as of 03/13/2011  Medication Sig Dispense Refill  . furosemide (LASIX) 20 MG tablet Take 1 tablet (20 mg total) by mouth every other day.  30 tablet  3  . lisinopril (PRINIVIL,ZESTRIL) 5 MG tablet Take 1 tablet (5 mg total) by mouth daily.  30 tablet  6  . metoprolol tartrate (LOPRESSOR) 50 MG tablet Take 1 tablet (50 mg total) by mouth 2 (two) times daily.  180 tablet  4  . pantoprazole (PROTONIX) 40 MG tablet Take 40 mg by mouth 2 (two) times daily.       . potassium chloride (KLOR-CON M10) 10 MEQ tablet Take 1 tablet (10 mEq total) by mouth every other day.  45 tablet  1  . sucralfate (CARAFATE) 1 GM/10ML suspension Take 1 g by mouth 4 (four) times daily -  before meals and at bedtime.          Review of Systems  Constitutional: Positive for fatigue. Negative for fever, chills, appetite change and unexpected weight change.  HENT: Negative for ear pain, congestion, sore throat, trouble swallowing, neck pain, voice change and sinus pressure.   Eyes: Negative for visual disturbance.  Respiratory: Negative for cough, shortness of breath, wheezing and stridor.   Cardiovascular: Positive for leg swelling. Negative for chest pain and palpitations.  Gastrointestinal: Negative for nausea, vomiting, abdominal pain, diarrhea, constipation, blood in stool, abdominal distention and anal bleeding.  Genitourinary: Negative for dysuria and flank pain.  Musculoskeletal: Negative for myalgias, arthralgias and gait problem.  Skin: Negative for color change and  rash.  Neurological: Negative for dizziness and headaches.  Hematological: Negative for adenopathy. Does not bruise/bleed easily.  Psychiatric/Behavioral: Negative for suicidal ideas, sleep disturbance and dysphoric mood. The patient is not nervous/anxious.    BP 112/74  Pulse 68  Temp(Src) 98.1 F (36.7 C) (Oral)  Resp 14  Ht 5' (1.524 m)  Wt 139 lb (63.05 kg)  BMI 27.15 kg/m2  SpO2 99%     Objective:   Physical Exam  Constitutional: She is oriented to person, place, and time. She appears well-developed and well-nourished. No distress.  HENT:  Head: Normocephalic and atraumatic.  Right Ear: External ear normal.  Left Ear: External ear normal.  Nose: Nose normal.  Mouth/Throat: Oropharynx is clear and moist. No oropharyngeal exudate.  Eyes: Conjunctivae are normal. Pupils are equal, round, and reactive to light. Right eye exhibits no discharge. Left eye exhibits no discharge. No scleral icterus.  Neck: Normal range of motion. Neck supple. No tracheal deviation present. No thyromegaly present.  Cardiovascular: Normal rate, regular rhythm and intact distal pulses.  Exam reveals no gallop and no friction rub.   Murmur (high pitched, systolic) heard. Pulmonary/Chest: Effort normal. No respiratory distress. She has no wheezes. She has rales (few diffuse). She exhibits no tenderness.  Musculoskeletal: Normal range of motion. She exhibits edema (pitting to mid lower leg). She exhibits no tenderness.  Lymphadenopathy:    She has no cervical adenopathy.  Neurological: She is alert and oriented to person, place, and time. No cranial nerve deficit. She exhibits  normal muscle tone. Coordination normal.  Skin: Skin is warm and dry. No rash noted. She is not diaphoretic. No erythema. No pallor.  Psychiatric: She has a normal mood and affect. Her behavior is normal. Judgment and thought content normal.          Assessment & Plan:

## 2011-03-13 NOTE — Assessment & Plan Note (Signed)
HR is regular today. Off Cardizem, on metoprolol. Not anticoagulated because of AVM and recent bleeding. Will continue to monitor.

## 2011-03-13 NOTE — Assessment & Plan Note (Signed)
Suspect chronic venous hypertension with edema. Will add compression stockings. Will change lasix to 20mg  daily. Check K today.

## 2011-03-13 NOTE — Assessment & Plan Note (Addendum)
Recent hemoglobin stable at 10.5.  Labs forwarded to Dr. Ricki Rodriguez. No further bleeding per pt. Will continue to monitor. Repeat CBC 1 month.

## 2011-03-18 ENCOUNTER — Encounter: Payer: Self-pay | Admitting: Internal Medicine

## 2011-03-26 HISTORY — PX: CATARACT EXTRACTION: SUR2

## 2011-04-17 ENCOUNTER — Ambulatory Visit (INDEPENDENT_AMBULATORY_CARE_PROVIDER_SITE_OTHER): Payer: Medicare Other | Admitting: Internal Medicine

## 2011-04-17 ENCOUNTER — Encounter: Payer: Self-pay | Admitting: Internal Medicine

## 2011-04-17 ENCOUNTER — Telehealth: Payer: Self-pay | Admitting: *Deleted

## 2011-04-17 ENCOUNTER — Telehealth: Payer: Self-pay | Admitting: Internal Medicine

## 2011-04-17 ENCOUNTER — Other Ambulatory Visit: Payer: Self-pay | Admitting: *Deleted

## 2011-04-17 VITALS — BP 168/90 | HR 63 | Temp 97.8°F | Ht 61.5 in | Wt 142.0 lb

## 2011-04-17 DIAGNOSIS — D649 Anemia, unspecified: Secondary | ICD-10-CM | POA: Diagnosis not present

## 2011-04-17 DIAGNOSIS — D510 Vitamin B12 deficiency anemia due to intrinsic factor deficiency: Secondary | ICD-10-CM | POA: Diagnosis not present

## 2011-04-17 DIAGNOSIS — R0989 Other specified symptoms and signs involving the circulatory and respiratory systems: Secondary | ICD-10-CM | POA: Diagnosis not present

## 2011-04-17 DIAGNOSIS — E538 Deficiency of other specified B group vitamins: Secondary | ICD-10-CM

## 2011-04-17 DIAGNOSIS — I509 Heart failure, unspecified: Secondary | ICD-10-CM | POA: Diagnosis not present

## 2011-04-17 DIAGNOSIS — I10 Essential (primary) hypertension: Secondary | ICD-10-CM

## 2011-04-17 DIAGNOSIS — R0609 Other forms of dyspnea: Secondary | ICD-10-CM | POA: Diagnosis not present

## 2011-04-17 DIAGNOSIS — R0600 Dyspnea, unspecified: Secondary | ICD-10-CM | POA: Insufficient documentation

## 2011-04-17 DIAGNOSIS — K922 Gastrointestinal hemorrhage, unspecified: Secondary | ICD-10-CM

## 2011-04-17 LAB — COMPREHENSIVE METABOLIC PANEL
ALT: 14 U/L (ref 0–35)
Alkaline Phosphatase: 66 U/L (ref 39–117)
CO2: 27 mEq/L (ref 19–32)
Creatinine, Ser: 1 mg/dL (ref 0.4–1.2)
GFR: 55.3 mL/min — ABNORMAL LOW (ref >60.00)
Glucose, Bld: 129 mg/dL — ABNORMAL HIGH (ref 70–99)
Sodium: 141 mEq/L (ref 135–145)
Total Bilirubin: 0.6 mg/dL (ref 0.3–1.2)
Total Protein: 6.6 g/dL (ref 6.0–8.3)

## 2011-04-17 LAB — CBC WITH DIFFERENTIAL/PLATELET
Basophils Absolute: 0 10*3/uL (ref 0.0–0.1)
Basophils Relative: 0.4 % (ref 0.0–3.0)
Eosinophils Absolute: 0.1 10*3/uL (ref 0.0–0.7)
Hemoglobin: 11 g/dL — ABNORMAL LOW (ref 12.0–15.0)
Lymphocytes Relative: 26.1 % (ref 12.0–46.0)
Lymphs Abs: 1.1 10*3/uL (ref 0.7–4.0)
MCHC: 34.1 g/dL (ref 30.0–36.0)
MCV: 92.8 fl (ref 78.0–100.0)
Monocytes Absolute: 0.4 10*3/uL (ref 0.1–1.0)
Neutro Abs: 2.7 10*3/uL (ref 1.4–7.7)
RBC: 3.49 Mil/uL — ABNORMAL LOW (ref 3.87–5.11)
RDW: 15 % — ABNORMAL HIGH (ref 11.5–14.6)

## 2011-04-17 LAB — BRAIN NATRIURETIC PEPTIDE: Pro B Natriuretic peptide (BNP): 943 pg/mL — ABNORMAL HIGH (ref 0.0–100.0)

## 2011-04-17 LAB — FERRITIN: Ferritin: 35.8 ng/mL (ref 10.0–291.0)

## 2011-04-17 MED ORDER — CYANOCOBALAMIN 1000 MCG/ML IJ SOLN
1000.0000 ug | Freq: Once | INTRAMUSCULAR | Status: AC
  Administered 2011-04-17: 1000 ug via INTRAMUSCULAR

## 2011-04-17 MED ORDER — PANTOPRAZOLE SODIUM 40 MG PO TBEC: 40.0000 mg | DELAYED_RELEASE_TABLET | Freq: Every day | ORAL | Status: DC

## 2011-04-17 NOTE — Assessment & Plan Note (Addendum)
Chronic and stable. Likely multifactorial. Patient has some basilar crackles on exam and lower extremity edema. Suspect element of congestive heart failure. Will check BNP with labs today. Last ECHO showed EF 35-45% in setting of afib. Will increase Lasix to daily. She has also been anemic which may be contributing so will check CBC with labs today. Pt daughter will call if symptoms not improving or if worsening. Would favor repeat CXR if symptoms persist. Follow up 1 month.

## 2011-04-17 NOTE — Telephone Encounter (Signed)
Okay 

## 2011-04-17 NOTE — Telephone Encounter (Signed)
Medications updated, my chart message sent

## 2011-04-17 NOTE — Progress Notes (Signed)
Subjective:    Patient ID: Dawn Cooper, female    DOB: [birth year], 78 y.o.   MRN: [id number]  HPI 78 year old female with a history of atrial fibrillation recent hospitalization for upper GI bleeding presents for followup. Overall, she reports that she's been doing well. She continues to have some fatigue and shortness of breath with exertion. She continues to have cough in the morning. This cough is productive of clear sputum. She denies any fever or chills. She denies any chest pain pr palpitations. She does note some lower extremity edema which has been chronic for her. She has not been wearing her compression stockings.  She denies any further GI bleeding. She has been followed by Dr. Marva Panda and a recent CBC last month was stable.  Outpatient Encounter Prescriptions as of 04/17/2011  Medication Sig Dispense Refill  . furosemide (LASIX) 20 MG tablet Take 1 tablet (20 mg total) by mouth every other day.  30 tablet  3  . lisinopril (PRINIVIL,ZESTRIL) 5 MG tablet Take 1 tablet (5 mg total) by mouth daily.  30 tablet  6  . metoprolol tartrate (LOPRESSOR) 50 MG tablet Take 1 tablet (50 mg total) by mouth 2 (two) times daily.  180 tablet  4  . pantoprazole (PROTONIX) 40 MG tablet Take 40 mg by mouth 2 (two) times daily.       . potassium chloride (KLOR-CON M10) 10 MEQ tablet Take 1 tablet (10 mEq total) by mouth every other day.  45 tablet  1  . sucralfate (CARAFATE) 1 GM/10ML suspension Take 1 g by mouth 4 (four) times daily -  before meals and at bedtime.          Review of Systems  Constitutional: Positive for fatigue. Negative for fever, chills, appetite change and unexpected weight change.  HENT: Negative for ear pain, congestion, sore throat, trouble swallowing, neck pain, voice change and sinus pressure.   Eyes: Negative for visual disturbance.  Respiratory: Positive for cough. Negative for shortness of breath, wheezing and stridor.   Cardiovascular: Negative for chest pain,  palpitations and leg swelling.  Gastrointestinal: Negative for nausea, vomiting, abdominal pain, diarrhea, constipation, blood in stool, abdominal distention and anal bleeding.  Genitourinary: Negative for dysuria and flank pain.  Musculoskeletal: Negative for myalgias, arthralgias and gait problem.  Skin: Negative for color change and rash.  Neurological: Negative for dizziness and headaches.  Hematological: Negative for adenopathy. Does not bruise/bleed easily.  Psychiatric/Behavioral: Negative for suicidal ideas, sleep disturbance and dysphoric mood. The patient is not nervous/anxious.    BP 168/90  Pulse 63  Temp(Src) 97.8 F (36.6 C) (Oral)  Ht 5' 1.5" (1.562 m)  Wt 142 lb (64.411 kg)  BMI 26.40 kg/m2  SpO2 96%     Objective:   Physical Exam  Constitutional: She is oriented to person, place, and time. She appears well-developed and well-nourished. No distress.  HENT:  Head: Normocephalic and atraumatic.  Right Ear: External ear normal.  Left Ear: External ear normal.  Nose: Nose normal.  Mouth/Throat: Oropharynx is clear and moist. No oropharyngeal exudate.  Eyes: Conjunctivae are normal. Pupils are equal, round, and reactive to light. Right eye exhibits no discharge. Left eye exhibits no discharge. No scleral icterus.  Neck: Normal range of motion. Neck supple. No tracheal deviation present. No thyromegaly present.  Cardiovascular: Normal rate and intact distal pulses.  An irregularly irregular rhythm present. Exam reveals no gallop and no friction rub.   Murmur heard. Pulmonary/Chest: Effort normal. No respiratory distress. She has  no wheezes. She has rales (bilateral bases). She exhibits no tenderness.  Musculoskeletal: Normal range of motion. She exhibits edema (1+ to mid lower leg). She exhibits no tenderness.  Lymphadenopathy:    She has no cervical adenopathy.  Neurological: She is alert and oriented to person, place, and time. No cranial nerve deficit. She exhibits  normal muscle tone. Coordination normal.  Skin: Skin is warm and dry. No rash noted. She is not diaphoretic. No erythema. No pallor.  Psychiatric: She has a normal mood and affect. Her behavior is normal. Judgment and thought content normal.          Assessment & Plan:

## 2011-04-17 NOTE — Assessment & Plan Note (Signed)
B12 low on labs. B12 supplementation started. Repeat B12 shot today.

## 2011-04-17 NOTE — Assessment & Plan Note (Signed)
Status post recent hospitalization. Patient reports no further bleeding. Will check CBC with labs today. Followup in 3 months.

## 2011-04-17 NOTE — Assessment & Plan Note (Signed)
Secondary to GI bleeding and pernicious anemia. Will check CBC and B12 level with labs today. Patient will followup in 3 months.

## 2011-04-17 NOTE — Telephone Encounter (Signed)
Spoke w/daughter - please remove carafate from med list and change protonix from 1 bid to 1 qd. OK to update this?

## 2011-04-17 NOTE — Telephone Encounter (Signed)
I forgot to say on my result note that pt ferritin was low indicating iron deficiency. I would like to start pt on Ferrous sulfate 325mg  po tid. I think she was on this before, but I did not see on med list.

## 2011-04-18 ENCOUNTER — Telehealth: Payer: Self-pay | Admitting: *Deleted

## 2011-04-18 MED ORDER — FERROUS SULFATE 325 (65 FE) MG PO TABS: 325.0000 mg | ORAL_TABLET | Freq: Three times a day (TID) | ORAL | Status: DC

## 2011-04-18 NOTE — Telephone Encounter (Signed)
Patient notified- Rx sent to pharmacy. 

## 2011-04-18 NOTE — Telephone Encounter (Signed)
Spoke w/pt and my chart mess sent

## 2011-04-18 NOTE — Telephone Encounter (Signed)
Message copied by Vernie Murders on Thu Apr 18, 2011  1:22 PM ------      Message from: Ronna Polio A      Created: Wed Apr 17, 2011  3:33 PM       Labs show that increased fluid may be contributing to her shortness of breath. I would like her to increase lasix to daily (instead of every other day).  I would like to move her appt up to 2 weeks.

## 2011-04-29 ENCOUNTER — Encounter: Payer: Self-pay | Admitting: Cardiovascular Disease

## 2011-04-29 ENCOUNTER — Ambulatory Visit (INDEPENDENT_AMBULATORY_CARE_PROVIDER_SITE_OTHER): Payer: Medicare Other | Admitting: Cardiovascular Disease

## 2011-04-29 VITALS — BP 162/92 | HR 84 | Ht 61.5 in | Wt 135.0 lb

## 2011-04-29 DIAGNOSIS — I359 Nonrheumatic aortic valve disorder, unspecified: Secondary | ICD-10-CM | POA: Diagnosis not present

## 2011-04-29 DIAGNOSIS — R609 Edema, unspecified: Secondary | ICD-10-CM | POA: Diagnosis not present

## 2011-04-29 DIAGNOSIS — I350 Nonrheumatic aortic (valve) stenosis: Secondary | ICD-10-CM

## 2011-04-29 DIAGNOSIS — I10 Essential (primary) hypertension: Secondary | ICD-10-CM | POA: Diagnosis not present

## 2011-04-29 DIAGNOSIS — I4891 Unspecified atrial fibrillation: Secondary | ICD-10-CM | POA: Diagnosis not present

## 2011-04-29 MED ORDER — LISINOPRIL 20 MG PO TABS: 20.0000 mg | ORAL_TABLET | Freq: Every day | ORAL | Status: DC

## 2011-04-29 NOTE — Assessment & Plan Note (Signed)
Rate is well controlled on metoprolol. Not a candidate for anticoagulation given her significant GI bleed.

## 2011-04-29 NOTE — Assessment & Plan Note (Signed)
Blood pressure is well controlled on today's visit. No changes made to the medications. 

## 2011-04-29 NOTE — Progress Notes (Signed)
Patient ID: Dawn Cooper, female    DOB: [birth year], 78 y.o.   MRN: [id number]  HPI Comments: Dawn Cooper is a very pleasant 78 year old woman With moderate aortic valve stenosis,  who presented to the hospital on January 02 2011 with shortness of breath, found to be in atrial fibrillation with RVR, elevated BNP, CT scan showing bilateral moderate sized pleural effusions who was started on rate control and pradaxa. She refused cardioversion after 4 weeks on anticoagulation. At approximately 6 weeks out, she had a AVM bleed and presented to the hospital with melena and hematocrit of 18. She had been having melena for at least one week and did not go to the hospital.  She had EGD with treatment of her AVM and now presents to the office for routine followup. All anticoagulation was held.   We had discussed the case with Dr. Marva Panda and it was felt that given her risk of further bleeding from additional AVMs and given her age and the extent of her recent bleed, that no further anticoagulation be used. She has followup with Dr. Dan Humphreys and GI later this week.    she has no complaints. She does report having edema both legs which is somewhat bothersome to her. Most recent hematocrit is 30.  Echocardiogram While she was in atrial fibrillation showed ejection fraction 35-45%, normal left ventricular systolic pressures, moderate aortic valve stenosis with mean gradient of 32 mmHg   total cholesterol 132, LDL 82, HDL 40, triglycerides 502, hemoglobin A1c 4.9  CT Scan of the chest showing moderate bilateral pleural effusions with compressive atelectasis in both lower lobes, no PE  EKG shows atrial fibrillation with left bundle branch block, rate 64 beats per minute   Outpatient Encounter Prescriptions as of 02/25/2011  Medication Sig Dispense Refill  . furosemide (LASIX) 40 MG tablet Take 40 mg by mouth every other day.       . lisinopril (PRINIVIL,ZESTRIL) 5 MG tablet Take 1 tablet (5 mg total) by mouth  daily.  30 tablet  6  . pantoprazole (PROTONIX) 40 MG tablet Take 40 mg by mouth 2 (two) times daily.       . potassium chloride (KLOR-CON M10) 10 MEQ tablet Take 1 tablet (10 mEq total) by mouth every other day.  45 tablet  1  . sucralfate (CARAFATE) 1 GM/10ML suspension Take 1 g by mouth 4 (four) times daily -  before meals and at bedtime.        Marland Kitchen  diltiazem (CARDIZEM CD) 120 MG 24 hr capsule Take 1 capsule (120 mg total) by mouth daily.  30 capsule  6  .  metoprolol tartrate (LOPRESSOR) 25 MG tablet Take 12.5 mg by mouth 2 (two) times daily.           Review of Systems  Constitutional: Negative.   HENT: Negative.   Eyes: Negative.   Respiratory: Negative.   Cardiovascular: Negative.   Gastrointestinal: Negative.   Musculoskeletal: Positive for gait problem.       Left flank pain  Skin: Negative.   Neurological: Negative.        Occasionally has symptoms of "wooziness" when she walks  Hematological: Negative.   Psychiatric/Behavioral: Negative.   All other systems reviewed and are negative.    BP 162/92  Pulse 84  Ht 5' 1.5" (1.562 m)  Wt 135 lb (61.236 kg)  BMI 25.10 kg/m2   Physical Exam  Nursing note and vitals reviewed. Constitutional: She is oriented to person, place, and time.  She appears well-developed and well-nourished.  HENT:  Head: Normocephalic.  Nose: Nose normal.  Mouth/Throat: Oropharynx is clear and moist.  Eyes: Conjunctivae are normal. Pupils are equal, round, and reactive to light.  Neck: Normal range of motion. Neck supple. No JVD present.  Cardiovascular: S1 normal, S2 normal and intact distal pulses.  An irregularly irregular rhythm present. Exam reveals no gallop and no friction rub.   Murmur heard.  Crescendo systolic murmur is present with a grade of 2/6  Pulmonary/Chest: Effort normal and breath sounds normal. No respiratory distress. She has no wheezes. She has no rales. She exhibits no tenderness.  Abdominal: Soft. Bowel sounds are normal.  She exhibits no distension. There is no tenderness.  Musculoskeletal: Normal range of motion. She exhibits edema. She exhibits no tenderness.       Trace edema to the low shins bilaterally  Lymphadenopathy:    She has no cervical adenopathy.  Neurological: She is alert and oriented to person, place, and time. Coordination normal.  Skin: Skin is warm and dry. No rash noted. No erythema.  Psychiatric: She has a normal mood and affect. Her behavior is normal. Judgment and thought content normal.         Assessment and Plan

## 2011-04-29 NOTE — Assessment & Plan Note (Signed)
We have encouraged her to continue her diuretics given her edema, given her moderate As and atrial fib.

## 2011-04-29 NOTE — Assessment & Plan Note (Signed)
Given her aortic valve stenosis and arrhythmia, she will need diuretics on a regular basis.  She does not seem particularly bothered by her left ear at this time.

## 2011-04-29 NOTE — Patient Instructions (Signed)
You are doing well. Please start aspirin 81 mg daily Please increase the lisinopril to 20 mg daily Take lasix daily for edema  OK to change lasix back to every other day when there is no edema  Please call us if you have new issues that need to be addressed before your next appt.  Your physician wants you to follow-up in: 6 months.  You will receive a reminder letter in the mail two months in advance. If you don't receive a letter, please call our office to schedule the follow-up appointment.

## 2011-05-13 ENCOUNTER — Other Ambulatory Visit: Payer: Self-pay | Admitting: *Deleted

## 2011-05-13 ENCOUNTER — Encounter: Payer: Self-pay | Admitting: Internal Medicine

## 2011-05-13 MED ORDER — FERROUS SULFATE 325 (65 FE) MG PO TABS: 325.0000 mg | ORAL_TABLET | Freq: Three times a day (TID) | ORAL | Status: DC

## 2011-05-20 ENCOUNTER — Ambulatory Visit (INDEPENDENT_AMBULATORY_CARE_PROVIDER_SITE_OTHER): Payer: Medicare Other | Admitting: *Deleted

## 2011-05-20 DIAGNOSIS — E538 Deficiency of other specified B group vitamins: Secondary | ICD-10-CM | POA: Diagnosis not present

## 2011-05-20 MED ORDER — CYANOCOBALAMIN 1000 MCG/ML IJ SOLN
1000.0000 ug | Freq: Once | INTRAMUSCULAR | Status: AC
  Administered 2011-05-20: 1000 ug via INTRAMUSCULAR

## 2011-06-19 ENCOUNTER — Ambulatory Visit (INDEPENDENT_AMBULATORY_CARE_PROVIDER_SITE_OTHER): Payer: Medicare Other | Admitting: *Deleted

## 2011-06-19 DIAGNOSIS — D510 Vitamin B12 deficiency anemia due to intrinsic factor deficiency: Secondary | ICD-10-CM

## 2011-06-19 MED ORDER — CYANOCOBALAMIN 1000 MCG/ML IJ SOLN
1000.0000 ug | Freq: Once | INTRAMUSCULAR | Status: AC
  Administered 2011-06-19: 1000 ug via INTRAMUSCULAR

## 2011-07-18 ENCOUNTER — Ambulatory Visit (INDEPENDENT_AMBULATORY_CARE_PROVIDER_SITE_OTHER): Payer: Medicare Other | Admitting: Internal Medicine

## 2011-07-18 ENCOUNTER — Encounter: Payer: Self-pay | Admitting: Internal Medicine

## 2011-07-18 VITALS — BP 160/84 | HR 56 | Ht 61.5 in | Wt 137.0 lb

## 2011-07-18 DIAGNOSIS — K219 Gastro-esophageal reflux disease without esophagitis: Secondary | ICD-10-CM

## 2011-07-18 DIAGNOSIS — R609 Edema, unspecified: Secondary | ICD-10-CM

## 2011-07-18 DIAGNOSIS — E538 Deficiency of other specified B group vitamins: Secondary | ICD-10-CM

## 2011-07-18 DIAGNOSIS — I10 Essential (primary) hypertension: Secondary | ICD-10-CM | POA: Diagnosis not present

## 2011-07-18 DIAGNOSIS — D509 Iron deficiency anemia, unspecified: Secondary | ICD-10-CM | POA: Diagnosis not present

## 2011-07-18 DIAGNOSIS — E876 Hypokalemia: Secondary | ICD-10-CM | POA: Diagnosis not present

## 2011-07-18 LAB — CBC WITH DIFFERENTIAL/PLATELET
Basophils Absolute: 0 10*3/uL (ref 0.0–0.1)
HCT: 38 % (ref 36.0–46.0)
Lymphs Abs: 1.6 10*3/uL (ref 0.7–4.0)
Monocytes Relative: 10.5 % (ref 3.0–12.0)
Neutrophils Relative %: 55.7 % (ref 43.0–77.0)
Platelets: 165 10*3/uL (ref 150.0–400.0)
RDW: 14.9 % — ABNORMAL HIGH (ref 11.5–14.6)

## 2011-07-18 LAB — FERRITIN: Ferritin: 112.5 ng/mL (ref 10.0–291.0)

## 2011-07-18 LAB — COMPREHENSIVE METABOLIC PANEL
ALT: 17 U/L (ref 0–35)
AST: 28 U/L (ref 0–37)
Calcium: 9.7 mg/dL (ref 8.4–10.5)
Chloride: 105 mEq/L (ref 96–112)
Creatinine, Ser: 1.1 mg/dL (ref 0.4–1.2)
Sodium: 140 mEq/L (ref 135–145)
Total Bilirubin: 0.6 mg/dL (ref 0.3–1.2)

## 2011-07-18 MED ORDER — PANTOPRAZOLE SODIUM 40 MG PO TBEC: 40.0000 mg | DELAYED_RELEASE_TABLET | Freq: Every day | ORAL | Status: DC

## 2011-07-18 MED ORDER — CYANOCOBALAMIN 1000 MCG/ML IJ SOLN
1000.0000 ug | Freq: Once | INTRAMUSCULAR | Status: AC
  Administered 2011-07-18: 1000 ug via INTRAMUSCULAR

## 2011-07-18 MED ORDER — POTASSIUM CHLORIDE CRYS ER 10 MEQ PO TBCR: 10.0000 meq | EXTENDED_RELEASE_TABLET | Freq: Every day | ORAL | Status: DC

## 2011-07-18 NOTE — Assessment & Plan Note (Signed)
Secondary to blood loss from GI ulcer. Hgb improving on last labs. Symptomatically improving with increased energy. Will recheck CBC and ferritin today.

## 2011-07-18 NOTE — Assessment & Plan Note (Signed)
Currently well controlled with qod use of lasix. Will check electrolytes and renal function with labs today.

## 2011-07-18 NOTE — Assessment & Plan Note (Signed)
With recent GI ulceration. Symptoms currently well controlled. Will continue protonix.

## 2011-07-18 NOTE — Assessment & Plan Note (Signed)
BP slightly elevated today, but has historically been well controlled. Will continue to monitor. Continue current medications. Follow up 3 months.

## 2011-07-18 NOTE — Assessment & Plan Note (Signed)
Secondary to diuretic use. Will continue supplemental K. Recheck labs today.

## 2011-07-18 NOTE — Progress Notes (Signed)
Subjective:    Patient ID: Dawn Cooper, female    DOB: Dec 04, 1918, 78 y.o.   MRN: [id number]  HPI 78 year old female with history of atrial fibrillation, upper GI bleeding presents for followup. Patient reports that her energy level is gradually improving. She denies any shortness of breath, chest pain, palpitations. She continues to take iron supplementation. She denies any recent nausea, vomiting, change in bowels. She does note some black stools but attributes this to iron use. She denies any abdominal pain.    Outpatient Encounter Prescriptions as of 07/18/2011  Medication Sig Dispense Refill  . ferrous sulfate 325 (65 FE) MG tablet Take 1 tablet (325 mg total) by mouth 3 (three) times daily with meals.  90 tablet  3  . furosemide (LASIX) 20 MG tablet Take 1 tablet (20 mg total) by mouth every other day.  30 tablet  3  . lisinopril (PRINIVIL,ZESTRIL) 20 MG tablet Take 1 tablet (20 mg total) by mouth daily.  30 tablet  6  . metoprolol tartrate (LOPRESSOR) 50 MG tablet Take 1 tablet (50 mg total) by mouth 2 (two) times daily.  180 tablet  4  . pantoprazole (PROTONIX) 40 MG tablet Take 1 tablet (40 mg total) by mouth daily.  90 tablet  3  . potassium chloride (KLOR-CON M10) 10 MEQ tablet Take 1 tablet (10 mEq total) by mouth daily.  90 tablet  1  . DISCONTD: potassium chloride (KLOR-CON M10) 10 MEQ tablet Take 1 tablet (10 mEq total) by mouth every other day.  45 tablet  1    BP 160/84  Pulse 56  Ht 5' 1.5" (1.562 m)  Wt 137 lb (62.143 kg)  BMI 25.47 kg/m2  SpO2 97%  Review of Systems  Constitutional: Negative for fever, chills, appetite change, fatigue and unexpected weight change.  HENT: Negative for ear pain, congestion, sore throat, trouble swallowing, neck pain, voice change and sinus pressure.   Eyes: Negative for visual disturbance.  Respiratory: Negative for cough, shortness of breath, wheezing and stridor.   Cardiovascular: Negative for chest pain, palpitations and leg  swelling.  Gastrointestinal: Negative for nausea, vomiting, abdominal pain, diarrhea, constipation, blood in stool, abdominal distention and anal bleeding.  Genitourinary: Negative for dysuria and flank pain.  Musculoskeletal: Negative for myalgias, arthralgias and gait problem.  Skin: Negative for color change and rash.  Neurological: Negative for dizziness and headaches.  Hematological: Negative for adenopathy. Does not bruise/bleed easily.  Psychiatric/Behavioral: Negative for suicidal ideas, sleep disturbance and dysphoric mood. The patient is not nervous/anxious.        Objective:   Physical Exam  Constitutional: She is oriented to person, place, and time. She appears well-developed and well-nourished. No distress.  HENT:  Head: Normocephalic and atraumatic.  Right Ear: External ear normal.  Left Ear: External ear normal.  Nose: Nose normal.  Mouth/Throat: Oropharynx is clear and moist. No oropharyngeal exudate.  Eyes: Conjunctivae are normal. Pupils are equal, round, and reactive to light. Right eye exhibits no discharge. Left eye exhibits no discharge. No scleral icterus.  Neck: Normal range of motion. Neck supple. No tracheal deviation present. No thyromegaly present.  Cardiovascular: Normal rate, normal heart sounds and intact distal pulses.  An irregularly irregular rhythm present. Exam reveals no gallop and no friction rub.   No murmur heard. Pulmonary/Chest: Effort normal and breath sounds normal. No respiratory distress. She has no wheezes. She has no rales. She exhibits no tenderness.  Musculoskeletal: Normal range of motion. She exhibits no edema and no  tenderness.  Lymphadenopathy:    She has no cervical adenopathy.  Neurological: She is alert and oriented to person, place, and time. No cranial nerve deficit. She exhibits normal muscle tone. Coordination normal.  Skin: Skin is warm and dry. No rash noted. She is not diaphoretic. No erythema. No pallor.  Psychiatric: She  has a normal mood and affect. Her behavior is normal. Judgment and thought content normal.          Assessment & Plan:

## 2011-07-19 ENCOUNTER — Telehealth: Payer: Self-pay | Admitting: *Deleted

## 2011-07-19 NOTE — Telephone Encounter (Signed)
error 

## 2011-07-22 ENCOUNTER — Ambulatory Visit: Payer: Medicare Other

## 2011-08-21 ENCOUNTER — Ambulatory Visit (INDEPENDENT_AMBULATORY_CARE_PROVIDER_SITE_OTHER): Payer: Medicare Other | Admitting: *Deleted

## 2011-08-21 DIAGNOSIS — E538 Deficiency of other specified B group vitamins: Secondary | ICD-10-CM

## 2011-08-21 MED ORDER — CYANOCOBALAMIN 1000 MCG/ML IJ SOLN
1000.0000 ug | Freq: Once | INTRAMUSCULAR | Status: AC
  Administered 2011-08-21: 1000 ug via INTRAMUSCULAR

## 2011-09-02 DIAGNOSIS — H2510 Age-related nuclear cataract, unspecified eye: Secondary | ICD-10-CM | POA: Diagnosis not present

## 2011-09-13 ENCOUNTER — Other Ambulatory Visit: Payer: Self-pay | Admitting: *Deleted

## 2011-09-13 DIAGNOSIS — R609 Edema, unspecified: Secondary | ICD-10-CM

## 2011-09-13 DIAGNOSIS — E876 Hypokalemia: Secondary | ICD-10-CM

## 2011-09-13 DIAGNOSIS — K219 Gastro-esophageal reflux disease without esophagitis: Secondary | ICD-10-CM

## 2011-09-13 MED ORDER — LISINOPRIL 20 MG PO TABS: 20.0000 mg | ORAL_TABLET | Freq: Every day | ORAL | Status: DC

## 2011-09-13 MED ORDER — POTASSIUM CHLORIDE CRYS ER 10 MEQ PO TBCR: 10.0000 meq | EXTENDED_RELEASE_TABLET | Freq: Every day | ORAL | Status: DC

## 2011-09-13 MED ORDER — PANTOPRAZOLE SODIUM 40 MG PO TBEC: 40.0000 mg | DELAYED_RELEASE_TABLET | Freq: Every day | ORAL | Status: DC

## 2011-09-13 MED ORDER — METOPROLOL TARTRATE 50 MG PO TABS: 50.0000 mg | ORAL_TABLET | Freq: Two times a day (BID) | ORAL | Status: DC

## 2011-09-13 MED ORDER — FUROSEMIDE 20 MG PO TABS: 20.0000 mg | ORAL_TABLET | ORAL | Status: DC

## 2011-09-13 NOTE — Telephone Encounter (Signed)
Refilled Metoprolol and Lisinopril.

## 2011-09-23 ENCOUNTER — Ambulatory Visit (INDEPENDENT_AMBULATORY_CARE_PROVIDER_SITE_OTHER): Payer: Medicare Other | Admitting: *Deleted

## 2011-09-23 DIAGNOSIS — E538 Deficiency of other specified B group vitamins: Secondary | ICD-10-CM | POA: Diagnosis not present

## 2011-09-23 MED ORDER — CYANOCOBALAMIN 1000 MCG/ML IJ SOLN
1000.0000 ug | Freq: Once | INTRAMUSCULAR | Status: AC
  Administered 2011-09-23: 1000 ug via INTRAMUSCULAR

## 2011-10-16 DIAGNOSIS — H2510 Age-related nuclear cataract, unspecified eye: Secondary | ICD-10-CM | POA: Diagnosis not present

## 2011-10-23 ENCOUNTER — Encounter: Payer: Self-pay | Admitting: Internal Medicine

## 2011-10-23 ENCOUNTER — Ambulatory Visit (INDEPENDENT_AMBULATORY_CARE_PROVIDER_SITE_OTHER): Payer: Medicare Other | Admitting: Internal Medicine

## 2011-10-23 VITALS — BP 168/68 | HR 54 | Temp 97.8°F | Resp 14 | Wt 140.5 lb

## 2011-10-23 DIAGNOSIS — I10 Essential (primary) hypertension: Secondary | ICD-10-CM | POA: Diagnosis not present

## 2011-10-23 DIAGNOSIS — D510 Vitamin B12 deficiency anemia due to intrinsic factor deficiency: Secondary | ICD-10-CM

## 2011-10-23 DIAGNOSIS — R2681 Unsteadiness on feet: Secondary | ICD-10-CM

## 2011-10-23 DIAGNOSIS — E538 Deficiency of other specified B group vitamins: Secondary | ICD-10-CM | POA: Diagnosis not present

## 2011-10-23 DIAGNOSIS — R269 Unspecified abnormalities of gait and mobility: Secondary | ICD-10-CM

## 2011-10-23 DIAGNOSIS — D509 Iron deficiency anemia, unspecified: Secondary | ICD-10-CM

## 2011-10-23 MED ORDER — CYANOCOBALAMIN 1000 MCG/ML IJ SOLN
1000.0000 ug | Freq: Once | INTRAMUSCULAR | Status: AC
  Administered 2011-10-23: 1000 ug via INTRAMUSCULAR

## 2011-10-23 NOTE — Progress Notes (Signed)
Subjective:    Patient ID: Dawn Cooper, female    DOB: Oct 04, 1918, 77 y.o.   MRN: [id number]  HPI 78YO female with HTN, h/o gastric ulcer with hemorrhage, iron def anemia presents for follow up. Doing generally well. Has some gait instability, but refuses to use cane or Dawn Cooper. This has been unchanged for several months. Notes that BP is well controlled at home. Did not bring record of BP today. No palpitations, chest pain, dyspnea. Good appetite.  Reports full compliance with medications.  Outpatient Encounter Prescriptions as of 10/23/2011  Medication Sig Dispense Refill  . furosemide (LASIX) 20 MG tablet Take 1 tablet (20 mg total) by mouth every other day.  90 tablet  3  . lisinopril (PRINIVIL,ZESTRIL) 20 MG tablet Take 1 tablet (20 mg total) by mouth daily.  30 tablet  6  . metoprolol (LOPRESSOR) 50 MG tablet Take 1 tablet (50 mg total) by mouth 2 (two) times daily.  180 tablet  3  . pantoprazole (PROTONIX) 40 MG tablet Take 1 tablet (40 mg total) by mouth daily.  90 tablet  3  . potassium chloride (KLOR-CON M10) 10 MEQ tablet Take 1 tablet (10 mEq total) by mouth daily.  90 tablet  3  . DISCONTD: ferrous sulfate 325 (65 FE) MG tablet Take 1 tablet (325 mg total) by mouth 3 (three) times daily with meals.  90 tablet  3   Facility-Administered Encounter Medications as of 10/23/2011  Medication Dose Route Frequency Provider Last Rate Last Dose  . cyanocobalamin ((VITAMIN B-12)) injection 1,000 mcg  1,000 mcg Intramuscular Once Sherlene Shams, MD   1,000 mcg at 10/23/11 0846   BP 168/68  Pulse 54  Temp 97.8 F (36.6 C) (Oral)  Resp 14  Wt 140 lb 8 oz (63.73 kg)  SpO2 97%  Review of Systems  Constitutional: Negative for fever, chills, appetite change, fatigue and unexpected weight change.  HENT: Negative for ear pain, congestion, sore throat, trouble swallowing, neck pain, voice change and sinus pressure.   Eyes: Negative for visual disturbance.  Respiratory: Negative for cough,  shortness of breath, wheezing and stridor.   Cardiovascular: Negative for chest pain, palpitations and leg swelling.  Gastrointestinal: Negative for nausea, vomiting, abdominal pain, diarrhea, constipation, blood in stool, abdominal distention and anal bleeding.  Genitourinary: Negative for dysuria and flank pain.  Musculoskeletal: Positive for gait problem. Negative for myalgias and arthralgias.  Skin: Negative for color change and rash.  Neurological: Positive for light-headedness. Negative for dizziness and headaches.  Hematological: Negative for adenopathy. Does not bruise/bleed easily.  Psychiatric/Behavioral: Negative for suicidal ideas, disturbed wake/sleep cycle and dysphoric mood. The patient is not nervous/anxious.        Objective:   Physical Exam  Constitutional: She is oriented to person, place, and time. She appears well-developed and well-nourished. No distress.  HENT:  Head: Normocephalic and atraumatic.  Right Ear: External ear normal.  Left Ear: External ear normal.  Nose: Nose normal.  Mouth/Throat: Oropharynx is clear and moist. No oropharyngeal exudate.  Eyes: Conjunctivae are normal. Pupils are equal, round, and reactive to light. Right eye exhibits no discharge. Left eye exhibits no discharge. No scleral icterus.  Neck: Normal range of motion. Neck supple. No tracheal deviation present. No thyromegaly present.  Cardiovascular: Normal rate, regular rhythm, normal heart sounds and intact distal pulses.  Exam reveals no gallop and no friction rub.   No murmur heard. Pulmonary/Chest: Effort normal and breath sounds normal. No respiratory distress. She has no wheezes.  She has no rales. She exhibits no tenderness.  Musculoskeletal: Normal range of motion. She exhibits no edema and no tenderness.  Lymphadenopathy:    She has no cervical adenopathy.  Neurological: She is alert and oriented to person, place, and time. No cranial nerve deficit. She exhibits normal muscle  tone. Coordination normal.  Skin: Skin is warm and dry. No rash noted. She is not diaphoretic. No erythema. No pallor.  Psychiatric: She has a normal mood and affect. Her behavior is normal. Judgment and thought content normal.          Assessment & Plan:

## 2011-10-23 NOTE — Assessment & Plan Note (Signed)
Labs in 06/2011 showed marked improvement in Hgb and ferritin. Will repeat 12/2011.

## 2011-10-23 NOTE — Assessment & Plan Note (Signed)
B12 shot today. 

## 2011-10-23 NOTE — Assessment & Plan Note (Signed)
BP elevated initially today, then improved on repeat. Has been well controlled at home. Will continue current medications. Repeat renal function in 3 months.

## 2011-10-23 NOTE — Assessment & Plan Note (Signed)
Chronic.  Offerred to set up vestibular therapy, but pt would like to hold off for now. Will continue to monitor.

## 2011-10-29 DIAGNOSIS — H2510 Age-related nuclear cataract, unspecified eye: Secondary | ICD-10-CM | POA: Diagnosis not present

## 2011-10-29 DIAGNOSIS — H269 Unspecified cataract: Secondary | ICD-10-CM | POA: Diagnosis not present

## 2011-10-29 DIAGNOSIS — H259 Unspecified age-related cataract: Secondary | ICD-10-CM | POA: Diagnosis not present

## 2011-11-06 ENCOUNTER — Ambulatory Visit: Payer: Medicare Other | Admitting: Cardiovascular Disease

## 2011-11-08 ENCOUNTER — Ambulatory Visit (INDEPENDENT_AMBULATORY_CARE_PROVIDER_SITE_OTHER): Payer: Medicare Other | Admitting: Cardiovascular Disease

## 2011-11-08 ENCOUNTER — Encounter: Payer: Self-pay | Admitting: Cardiovascular Disease

## 2011-11-08 VITALS — BP 138/86 | HR 48 | Ht 64.0 in | Wt 142.4 lb

## 2011-11-08 DIAGNOSIS — I350 Nonrheumatic aortic (valve) stenosis: Secondary | ICD-10-CM

## 2011-11-08 DIAGNOSIS — I10 Essential (primary) hypertension: Secondary | ICD-10-CM | POA: Diagnosis not present

## 2011-11-08 DIAGNOSIS — I4891 Unspecified atrial fibrillation: Secondary | ICD-10-CM

## 2011-11-08 DIAGNOSIS — R609 Edema, unspecified: Secondary | ICD-10-CM | POA: Diagnosis not present

## 2011-11-08 DIAGNOSIS — I359 Nonrheumatic aortic valve disorder, unspecified: Secondary | ICD-10-CM

## 2011-11-08 MED ORDER — LISINOPRIL 40 MG PO TABS: 40.0000 mg | ORAL_TABLET | Freq: Every day | ORAL | Status: DC

## 2011-11-08 NOTE — Assessment & Plan Note (Signed)
Blood pressure was very elevated on today's visit. She does report and have documentation of good blood pressures at home in the 110-130 range. Blood pressure was brought in today and is consistent with my measurements taken manually. We have asked her to continue to monitor her blood pressure at home. We'll decrease her metoprolol for bradycardia, increase her lisinopril to 40 mg daily.

## 2011-11-08 NOTE — Patient Instructions (Addendum)
Please cut the metoprolol in 1/2 and take 25 mg twice a day Please increase the lisinopril to 40 mg daily   Please call us if you have new issues that need to be addressed before your next appt.  Your physician wants you to follow-up in: 6 months.  You will receive a reminder letter in the mail two months in advance. If you don't receive a letter, please call our office to schedule the follow-up appointment.

## 2011-11-08 NOTE — Assessment & Plan Note (Signed)
Edema is nonpitting. Likely secondary to venous insufficiency.

## 2011-11-08 NOTE — Progress Notes (Signed)
Patient ID: Dawn Cooper, female    DOB: December 22, 1918, 78 y.o.   MRN: [id number]  HPI Comments: Dawn Cooper is a very pleasant 78 year old woman with moderate aortic valve stenosis,  who presented to the hospital on January 02 2011 with shortness of breath, found to be in atrial fibrillation with RVR, elevated BNP, CT scan showing bilateral moderate sized pleural effusions who was started on rate control and pradaxa. She refused cardioversion after 4 weeks on anticoagulation. At approximately 6 weeks out, she had a AVM bleed and presented to the hospital with melena and hematocrit of 18. She had been having melena for at least one week and did not go to the hospital.  She had EGD with treatment of her AVM and now presents to the office for routine followup. All anticoagulation was held.   We had discussed the case with Dr. Marva Panda and it was felt that given her risk of further bleeding from additional AVMs and given her age and the extent of her recent bleed, that no further anticoagulation be used. She has followup with Dr. Dan Humphreys and GI later this week.   She reports that she has not had any palpitations for some time. She feels well. Occasional lightheadedness when she walks and dizziness when she stands. She had recent cataract surgery on the left. Schedule cataract surgery on the right possibly next month. Otherwise no complaints  Echocardiogram While she was in atrial fibrillation showed ejection fraction 35-45%, normal left ventricular systolic pressures, moderate aortic valve stenosis with mean gradient of 32 mmHg  Previous lab work:  total cholesterol 132, LDL 82, HDL 40, triglycerides 502, hemoglobin A1c 4.9  CT Scan of the chest showing moderate bilateral pleural effusions with compressive atelectasis in both lower lobes, no PE  EKG SHOWS NORMAL SINUS RHYTHM WITH LEFT BUNDLE BRANCH BLOCK with rate 48 beats per minute    Outpatient Encounter Prescriptions as of 11/08/2011  Medication  Sig Dispense Refill  . Cyanocobalamin (VITAMIN B-12 IJ) Inject as directed. Monthly.      . furosemide (LASIX) 20 MG tablet Take 1 tablet (20 mg total) by mouth every other day.  90 tablet  3  . NON FORMULARY Eye Drops 1 drop left eye 3 times daily.      . pantoprazole (PROTONIX) 40 MG tablet Take 1 tablet (40 mg total) by mouth daily.  90 tablet  3  . potassium chloride (KLOR-CON M10) 10 MEQ tablet Take 1 tablet (10 mEq total) by mouth daily.  90 tablet  3  .  lisinopril (PRINIVIL,ZESTRIL) 20 MG tablet Take 1 tablet (20 mg total) by mouth daily.  30 tablet  6  .  metoprolol (LOPRESSOR) 50 MG tablet Take 1 tablet (50 mg total) by mouth 2 (two) times daily.  180 tablet  3     Review of Systems  Constitutional: Negative.   HENT: Negative.   Eyes: Negative.   Respiratory: Negative.   Cardiovascular: Negative.   Gastrointestinal: Negative.   Musculoskeletal: Positive for gait problem.  Skin: Negative.   Neurological: Negative.        Occasionally has symptoms of "wooziness" when she walks  Hematological: Negative.   Psychiatric/Behavioral: Negative.   All other systems reviewed and are negative.    BP 138/86  Pulse 48  Ht 5\' 4"  (1.626 m)  Wt 142 lb 6 oz (64.581 kg)  BMI 24.44 kg/m2  Physical Exam  Nursing note and vitals reviewed. Constitutional: She is oriented to person, place, and time.  She appears well-developed and well-nourished.  HENT:  Head: Normocephalic.  Nose: Nose normal.  Mouth/Throat: Oropharynx is clear and moist.  Eyes: Conjunctivae are normal. Pupils are equal, round, and reactive to light.  Neck: Normal range of motion. Neck supple. No JVD present.  Cardiovascular: Regular rhythm, S1 normal, S2 normal and intact distal pulses.  Bradycardia present.  Exam reveals no gallop and no friction rub.   Murmur heard.  Crescendo systolic murmur is present with a grade of 2/6  Pulmonary/Chest: Effort normal and breath sounds normal. No respiratory distress. She has no  wheezes. She has no rales. She exhibits no tenderness.  Abdominal: Soft. Bowel sounds are normal. She exhibits no distension. There is no tenderness.  Musculoskeletal: Normal range of motion. She exhibits no edema and no tenderness.       Trace edema to the low shins bilaterally  Lymphadenopathy:    She has no cervical adenopathy.  Neurological: She is alert and oriented to person, place, and time. Coordination normal.  Skin: Skin is warm and dry. No rash noted. No erythema.  Psychiatric: She has a normal mood and affect. Her behavior is normal. Judgment and thought content normal.         Assessment and Plan

## 2011-11-08 NOTE — Assessment & Plan Note (Signed)
Moderate aortic valve stenosis. Will repeat echocardiogram next year. This will likely contribute to recurrent atrial fibrillation.

## 2011-11-08 NOTE — Assessment & Plan Note (Signed)
Since her last clinic visit, she has converted to normal sinus rhythm. She is bradycardic on the metoprolol. We'll decrease the dose to 25 mg twice a day.

## 2011-11-29 DIAGNOSIS — H2510 Age-related nuclear cataract, unspecified eye: Secondary | ICD-10-CM | POA: Diagnosis not present

## 2011-12-10 DIAGNOSIS — H269 Unspecified cataract: Secondary | ICD-10-CM | POA: Diagnosis not present

## 2011-12-10 DIAGNOSIS — H2510 Age-related nuclear cataract, unspecified eye: Secondary | ICD-10-CM | POA: Diagnosis not present

## 2011-12-10 DIAGNOSIS — H259 Unspecified age-related cataract: Secondary | ICD-10-CM | POA: Diagnosis not present

## 2011-12-24 HISTORY — PX: FEMUR FRACTURE SURGERY: SHX633

## 2012-01-13 ENCOUNTER — Inpatient Hospital Stay: Payer: Self-pay | Admitting: Specialist

## 2012-01-13 DIAGNOSIS — Z01818 Encounter for other preprocedural examination: Secondary | ICD-10-CM | POA: Diagnosis not present

## 2012-01-13 DIAGNOSIS — Z808 Family history of malignant neoplasm of other organs or systems: Secondary | ICD-10-CM | POA: Diagnosis not present

## 2012-01-13 DIAGNOSIS — Z880 Allergy status to penicillin: Secondary | ICD-10-CM | POA: Diagnosis not present

## 2012-01-13 DIAGNOSIS — Z801 Family history of malignant neoplasm of trachea, bronchus and lung: Secondary | ICD-10-CM | POA: Diagnosis not present

## 2012-01-13 DIAGNOSIS — S0003XA Contusion of scalp, initial encounter: Secondary | ICD-10-CM | POA: Diagnosis not present

## 2012-01-13 DIAGNOSIS — S7223XB Displaced subtrochanteric fracture of unspecified femur, initial encounter for open fracture type I or II: Secondary | ICD-10-CM | POA: Diagnosis not present

## 2012-01-13 DIAGNOSIS — R110 Nausea: Secondary | ICD-10-CM | POA: Diagnosis not present

## 2012-01-13 DIAGNOSIS — S79929A Unspecified injury of unspecified thigh, initial encounter: Secondary | ICD-10-CM | POA: Diagnosis not present

## 2012-01-13 DIAGNOSIS — S72109A Unspecified trochanteric fracture of unspecified femur, initial encounter for closed fracture: Secondary | ICD-10-CM | POA: Diagnosis not present

## 2012-01-13 DIAGNOSIS — S7290XD Unspecified fracture of unspecified femur, subsequent encounter for closed fracture with routine healing: Secondary | ICD-10-CM | POA: Diagnosis not present

## 2012-01-13 DIAGNOSIS — M6281 Muscle weakness (generalized): Secondary | ICD-10-CM | POA: Diagnosis not present

## 2012-01-13 DIAGNOSIS — Z23 Encounter for immunization: Secondary | ICD-10-CM | POA: Diagnosis not present

## 2012-01-13 DIAGNOSIS — S72143A Displaced intertrochanteric fracture of unspecified femur, initial encounter for closed fracture: Secondary | ICD-10-CM | POA: Diagnosis not present

## 2012-01-13 DIAGNOSIS — IMO0002 Reserved for concepts with insufficient information to code with codable children: Secondary | ICD-10-CM | POA: Diagnosis not present

## 2012-01-13 DIAGNOSIS — Z5189 Encounter for other specified aftercare: Secondary | ICD-10-CM | POA: Diagnosis not present

## 2012-01-13 DIAGNOSIS — E860 Dehydration: Secondary | ICD-10-CM | POA: Diagnosis present

## 2012-01-13 DIAGNOSIS — I4891 Unspecified atrial fibrillation: Secondary | ICD-10-CM | POA: Diagnosis not present

## 2012-01-13 DIAGNOSIS — N179 Acute kidney failure, unspecified: Secondary | ICD-10-CM | POA: Diagnosis not present

## 2012-01-13 DIAGNOSIS — S7223XA Displaced subtrochanteric fracture of unspecified femur, initial encounter for closed fracture: Secondary | ICD-10-CM | POA: Diagnosis not present

## 2012-01-13 DIAGNOSIS — I359 Nonrheumatic aortic valve disorder, unspecified: Secondary | ICD-10-CM | POA: Diagnosis present

## 2012-01-13 DIAGNOSIS — Z79899 Other long term (current) drug therapy: Secondary | ICD-10-CM | POA: Diagnosis not present

## 2012-01-13 DIAGNOSIS — I10 Essential (primary) hypertension: Secondary | ICD-10-CM | POA: Diagnosis not present

## 2012-01-13 DIAGNOSIS — Z9181 History of falling: Secondary | ICD-10-CM | POA: Diagnosis not present

## 2012-01-13 DIAGNOSIS — R269 Unspecified abnormalities of gait and mobility: Secondary | ICD-10-CM | POA: Diagnosis not present

## 2012-01-13 DIAGNOSIS — I447 Left bundle-branch block, unspecified: Secondary | ICD-10-CM | POA: Diagnosis not present

## 2012-01-13 DIAGNOSIS — I080 Rheumatic disorders of both mitral and aortic valves: Secondary | ICD-10-CM | POA: Diagnosis not present

## 2012-01-13 DIAGNOSIS — R6889 Other general symptoms and signs: Secondary | ICD-10-CM | POA: Diagnosis not present

## 2012-01-13 DIAGNOSIS — S72009A Fracture of unspecified part of neck of unspecified femur, initial encounter for closed fracture: Secondary | ICD-10-CM | POA: Diagnosis not present

## 2012-01-13 DIAGNOSIS — S0990XA Unspecified injury of head, initial encounter: Secondary | ICD-10-CM | POA: Diagnosis not present

## 2012-01-13 DIAGNOSIS — I959 Hypotension, unspecified: Secondary | ICD-10-CM | POA: Diagnosis not present

## 2012-01-13 DIAGNOSIS — I509 Heart failure, unspecified: Secondary | ICD-10-CM | POA: Diagnosis not present

## 2012-01-13 DIAGNOSIS — Z4789 Encounter for other orthopedic aftercare: Secondary | ICD-10-CM | POA: Diagnosis not present

## 2012-01-13 DIAGNOSIS — Z7982 Long term (current) use of aspirin: Secondary | ICD-10-CM | POA: Diagnosis not present

## 2012-01-13 LAB — URINALYSIS, COMPLETE
Bacteria: NONE SEEN
Bilirubin,UR: NEGATIVE
Glucose,UR: NEGATIVE mg/dL (ref 0–75)
Hyaline Cast: 7
Ketone: NEGATIVE
Specific Gravity: 1.013 (ref 1.003–1.030)
Squamous Epithelial: <1
WBC UR: 1 /HPF (ref 0–5)

## 2012-01-13 LAB — BASIC METABOLIC PANEL
Calcium, Total: 8.9 mg/dL (ref 8.5–10.1)
Chloride: 108 mmol/L — ABNORMAL HIGH (ref 98–107)
Co2: 26 mmol/L (ref 21–32)
EGFR (Non-African Amer.): 34 — ABNORMAL LOW
Glucose: 137 mg/dL — ABNORMAL HIGH (ref 65–99)
Potassium: 4.4 mmol/L (ref 3.5–5.1)
Sodium: 141 mmol/L (ref 136–145)

## 2012-01-13 LAB — PROTIME-INR
INR: 1
Prothrombin Time: 13.7 secs (ref 11.5–14.7)

## 2012-01-13 LAB — CBC
HCT: 33.7 % — ABNORMAL LOW (ref 35.0–47.0)
WBC: 9 10*3/uL (ref 3.6–11.0)

## 2012-01-14 ENCOUNTER — Telehealth: Payer: Self-pay | Admitting: Internal Medicine

## 2012-01-14 LAB — CBC WITH DIFFERENTIAL/PLATELET
Basophil #: 0 10*3/uL (ref 0.0–0.1)
Basophil %: 0.2 %
Eosinophil #: 0 10*3/uL (ref 0.0–0.7)
HCT: 27.3 % — ABNORMAL LOW (ref 35.0–47.0)
Lymphocyte #: 1.4 10*3/uL (ref 1.0–3.6)
MCH: 31.8 pg (ref 26.0–34.0)
MCHC: 34 g/dL (ref 32.0–36.0)
Neutrophil #: 6.5 10*3/uL (ref 1.4–6.5)
Platelet: 169 10*3/uL (ref 150–440)
RDW: 12.7 % (ref 11.5–14.5)
WBC: 8.7 10*3/uL (ref 3.6–11.0)

## 2012-01-14 LAB — BASIC METABOLIC PANEL
Anion Gap: 8 (ref 7–16)
BUN: 20 mg/dL — ABNORMAL HIGH (ref 7–18)
Calcium, Total: 8.6 mg/dL (ref 8.5–10.1)
Co2: 27 mmol/L (ref 21–32)
EGFR (Non-African Amer.): 37 — ABNORMAL LOW
Glucose: 124 mg/dL — ABNORMAL HIGH (ref 65–99)
Osmolality: 289 (ref 275–301)
Potassium: 4.6 mmol/L (ref 3.5–5.1)

## 2012-01-14 NOTE — Telephone Encounter (Signed)
Dawn Cooper called to let you know that ms Dawn Cooper fell yesterday and is in [facility].  Ms Dawn Cooper broke right leg.  Is having surgery today at 11:30

## 2012-01-14 NOTE — Telephone Encounter (Signed)
Can we request notes from [facility]?

## 2012-01-15 ENCOUNTER — Ambulatory Visit: Payer: Medicare Other

## 2012-01-15 LAB — BASIC METABOLIC PANEL
Anion Gap: 7 (ref 7–16)
BUN: 19 mg/dL — ABNORMAL HIGH (ref 7–18)
Co2: 27 mmol/L (ref 21–32)
Creatinine: 1.14 mg/dL (ref 0.60–1.30)
EGFR (Non-African Amer.): 42 — ABNORMAL LOW
Glucose: 114 mg/dL — ABNORMAL HIGH (ref 65–99)
Osmolality: 283 (ref 275–301)
Potassium: 4.2 mmol/L (ref 3.5–5.1)
Sodium: 140 mmol/L (ref 136–145)

## 2012-01-15 LAB — CBC WITH DIFFERENTIAL/PLATELET
Eosinophil %: 0.3 %
HCT: 30.1 % — ABNORMAL LOW (ref 35.0–47.0)
HGB: 10.9 g/dL — ABNORMAL LOW (ref 12.0–16.0)
Lymphocyte #: 1.5 10*3/uL (ref 1.0–3.6)
MCH: 32.7 pg (ref 26.0–34.0)
MCV: 91 fL (ref 80–100)
Monocyte %: 12 %
Neutrophil #: 7.1 10*3/uL — ABNORMAL HIGH (ref 1.4–6.5)
RBC: 3.32 10*6/uL — ABNORMAL LOW (ref 3.80–5.20)
WBC: 9.9 10*3/uL (ref 3.6–11.0)

## 2012-01-16 LAB — HEMOGLOBIN: HGB: 9.1 g/dL — ABNORMAL LOW (ref 12.0–16.0)

## 2012-01-17 DIAGNOSIS — S7223XB Displaced subtrochanteric fracture of unspecified femur, initial encounter for open fracture type I or II: Secondary | ICD-10-CM | POA: Diagnosis not present

## 2012-01-17 DIAGNOSIS — I509 Heart failure, unspecified: Secondary | ICD-10-CM | POA: Diagnosis not present

## 2012-01-17 DIAGNOSIS — S7223XA Displaced subtrochanteric fracture of unspecified femur, initial encounter for closed fracture: Secondary | ICD-10-CM | POA: Diagnosis not present

## 2012-01-17 DIAGNOSIS — E860 Dehydration: Secondary | ICD-10-CM | POA: Diagnosis not present

## 2012-01-17 DIAGNOSIS — M25559 Pain in unspecified hip: Secondary | ICD-10-CM | POA: Diagnosis not present

## 2012-01-17 DIAGNOSIS — Z4789 Encounter for other orthopedic aftercare: Secondary | ICD-10-CM | POA: Diagnosis not present

## 2012-01-17 DIAGNOSIS — R6889 Other general symptoms and signs: Secondary | ICD-10-CM | POA: Diagnosis not present

## 2012-01-17 DIAGNOSIS — I080 Rheumatic disorders of both mitral and aortic valves: Secondary | ICD-10-CM | POA: Diagnosis not present

## 2012-01-17 DIAGNOSIS — I447 Left bundle-branch block, unspecified: Secondary | ICD-10-CM | POA: Diagnosis not present

## 2012-01-17 DIAGNOSIS — Z9181 History of falling: Secondary | ICD-10-CM | POA: Diagnosis not present

## 2012-01-17 DIAGNOSIS — I10 Essential (primary) hypertension: Secondary | ICD-10-CM | POA: Diagnosis not present

## 2012-01-17 DIAGNOSIS — R269 Unspecified abnormalities of gait and mobility: Secondary | ICD-10-CM | POA: Diagnosis not present

## 2012-01-17 DIAGNOSIS — Z5189 Encounter for other specified aftercare: Secondary | ICD-10-CM | POA: Diagnosis not present

## 2012-01-17 DIAGNOSIS — S7290XD Unspecified fracture of unspecified femur, subsequent encounter for closed fracture with routine healing: Secondary | ICD-10-CM | POA: Diagnosis not present

## 2012-01-17 DIAGNOSIS — N179 Acute kidney failure, unspecified: Secondary | ICD-10-CM | POA: Diagnosis not present

## 2012-01-17 DIAGNOSIS — M6281 Muscle weakness (generalized): Secondary | ICD-10-CM | POA: Diagnosis not present

## 2012-01-17 DIAGNOSIS — I4891 Unspecified atrial fibrillation: Secondary | ICD-10-CM | POA: Diagnosis not present

## 2012-01-17 DIAGNOSIS — I359 Nonrheumatic aortic valve disorder, unspecified: Secondary | ICD-10-CM | POA: Diagnosis not present

## 2012-01-17 DIAGNOSIS — I959 Hypotension, unspecified: Secondary | ICD-10-CM | POA: Diagnosis not present

## 2012-01-17 DIAGNOSIS — M810 Age-related osteoporosis without current pathological fracture: Secondary | ICD-10-CM | POA: Diagnosis not present

## 2012-01-18 ENCOUNTER — Encounter: Payer: Self-pay | Admitting: Internal Medicine

## 2012-01-20 DIAGNOSIS — I4891 Unspecified atrial fibrillation: Secondary | ICD-10-CM | POA: Diagnosis not present

## 2012-01-20 DIAGNOSIS — M810 Age-related osteoporosis without current pathological fracture: Secondary | ICD-10-CM | POA: Diagnosis not present

## 2012-01-23 LAB — CBC WITH DIFFERENTIAL/PLATELET
Basophil #: 0.1 10*3/uL (ref 0.0–0.1)
Basophil %: 1 %
Eosinophil %: 5.3 %
HCT: 28.1 % — ABNORMAL LOW (ref 35.0–47.0)
HGB: 9.8 g/dL — ABNORMAL LOW (ref 12.0–16.0)
Lymphocyte #: 1.9 10*3/uL (ref 1.0–3.6)
Lymphocyte %: 26 %
MCV: 95 fL (ref 80–100)
Monocyte %: 8.3 %
Neutrophil #: 4.3 10*3/uL (ref 1.4–6.5)
RBC: 2.97 10*6/uL — ABNORMAL LOW (ref 3.80–5.20)
WBC: 7.3 10*3/uL (ref 3.6–11.0)

## 2012-01-24 ENCOUNTER — Encounter: Payer: Self-pay | Admitting: Internal Medicine

## 2012-01-28 DIAGNOSIS — I4891 Unspecified atrial fibrillation: Secondary | ICD-10-CM | POA: Diagnosis not present

## 2012-01-28 DIAGNOSIS — I359 Nonrheumatic aortic valve disorder, unspecified: Secondary | ICD-10-CM | POA: Diagnosis not present

## 2012-01-28 DIAGNOSIS — I10 Essential (primary) hypertension: Secondary | ICD-10-CM | POA: Diagnosis not present

## 2012-01-28 DIAGNOSIS — M25559 Pain in unspecified hip: Secondary | ICD-10-CM | POA: Diagnosis not present

## 2012-01-28 LAB — CBC WITH DIFFERENTIAL/PLATELET
Basophil #: 0.1 10*3/uL (ref 0.0–0.1)
Basophil %: 0.8 %
Eosinophil #: 0.4 10*3/uL (ref 0.0–0.7)
HGB: 11.2 g/dL — ABNORMAL LOW (ref 12.0–16.0)
Lymphocyte #: 2 10*3/uL (ref 1.0–3.6)
MCH: 32.4 pg (ref 26.0–34.0)
MCHC: 33.8 g/dL (ref 32.0–36.0)
MCV: 96 fL (ref 80–100)
Monocyte #: 0.6 x10 3/mm (ref 0.2–0.9)
Monocyte %: 8.3 %
Platelet: 407 10*3/uL (ref 150–440)
RDW: 15 % — ABNORMAL HIGH (ref 11.5–14.5)
WBC: 7 10*3/uL (ref 3.6–11.0)

## 2012-01-31 NOTE — Telephone Encounter (Signed)
Notes from [facility] are in patients chart under Media.

## 2012-02-03 DIAGNOSIS — S7223XB Displaced subtrochanteric fracture of unspecified femur, initial encounter for open fracture type I or II: Secondary | ICD-10-CM | POA: Diagnosis not present

## 2012-02-07 DIAGNOSIS — I4891 Unspecified atrial fibrillation: Secondary | ICD-10-CM | POA: Diagnosis not present

## 2012-02-17 DIAGNOSIS — S72009D Fracture of unspecified part of neck of unspecified femur, subsequent encounter for closed fracture with routine healing: Secondary | ICD-10-CM | POA: Diagnosis not present

## 2012-02-17 DIAGNOSIS — I129 Hypertensive chronic kidney disease with stage 1 through stage 4 chronic kidney disease, or unspecified chronic kidney disease: Secondary | ICD-10-CM | POA: Diagnosis not present

## 2012-02-17 DIAGNOSIS — I509 Heart failure, unspecified: Secondary | ICD-10-CM | POA: Diagnosis not present

## 2012-02-17 DIAGNOSIS — D649 Anemia, unspecified: Secondary | ICD-10-CM | POA: Diagnosis not present

## 2012-02-19 DIAGNOSIS — D649 Anemia, unspecified: Secondary | ICD-10-CM | POA: Diagnosis not present

## 2012-02-19 DIAGNOSIS — S72009D Fracture of unspecified part of neck of unspecified femur, subsequent encounter for closed fracture with routine healing: Secondary | ICD-10-CM | POA: Diagnosis not present

## 2012-02-19 DIAGNOSIS — I509 Heart failure, unspecified: Secondary | ICD-10-CM | POA: Diagnosis not present

## 2012-02-19 DIAGNOSIS — I129 Hypertensive chronic kidney disease with stage 1 through stage 4 chronic kidney disease, or unspecified chronic kidney disease: Secondary | ICD-10-CM | POA: Diagnosis not present

## 2012-02-25 DIAGNOSIS — I509 Heart failure, unspecified: Secondary | ICD-10-CM | POA: Diagnosis not present

## 2012-02-25 DIAGNOSIS — D649 Anemia, unspecified: Secondary | ICD-10-CM | POA: Diagnosis not present

## 2012-02-25 DIAGNOSIS — S72009D Fracture of unspecified part of neck of unspecified femur, subsequent encounter for closed fracture with routine healing: Secondary | ICD-10-CM | POA: Diagnosis not present

## 2012-02-25 DIAGNOSIS — I129 Hypertensive chronic kidney disease with stage 1 through stage 4 chronic kidney disease, or unspecified chronic kidney disease: Secondary | ICD-10-CM | POA: Diagnosis not present

## 2012-03-02 DIAGNOSIS — S7223XB Displaced subtrochanteric fracture of unspecified femur, initial encounter for open fracture type I or II: Secondary | ICD-10-CM | POA: Diagnosis not present

## 2012-03-04 DIAGNOSIS — S7223XB Displaced subtrochanteric fracture of unspecified femur, initial encounter for open fracture type I or II: Secondary | ICD-10-CM | POA: Diagnosis not present

## 2012-03-07 DIAGNOSIS — D649 Anemia, unspecified: Secondary | ICD-10-CM | POA: Diagnosis not present

## 2012-03-07 DIAGNOSIS — I509 Heart failure, unspecified: Secondary | ICD-10-CM | POA: Diagnosis not present

## 2012-03-10 DIAGNOSIS — S7223XB Displaced subtrochanteric fracture of unspecified femur, initial encounter for open fracture type I or II: Secondary | ICD-10-CM | POA: Diagnosis not present

## 2012-03-12 DIAGNOSIS — S7223XB Displaced subtrochanteric fracture of unspecified femur, initial encounter for open fracture type I or II: Secondary | ICD-10-CM | POA: Diagnosis not present

## 2012-03-19 DIAGNOSIS — S7223XB Displaced subtrochanteric fracture of unspecified femur, initial encounter for open fracture type I or II: Secondary | ICD-10-CM | POA: Diagnosis not present

## 2012-03-27 DIAGNOSIS — S7223XB Displaced subtrochanteric fracture of unspecified femur, initial encounter for open fracture type I or II: Secondary | ICD-10-CM | POA: Diagnosis not present

## 2012-04-01 DIAGNOSIS — S7223XB Displaced subtrochanteric fracture of unspecified femur, initial encounter for open fracture type I or II: Secondary | ICD-10-CM | POA: Diagnosis not present

## 2012-04-03 DIAGNOSIS — S7223XB Displaced subtrochanteric fracture of unspecified femur, initial encounter for open fracture type I or II: Secondary | ICD-10-CM | POA: Diagnosis not present

## 2012-04-08 DIAGNOSIS — S7223XB Displaced subtrochanteric fracture of unspecified femur, initial encounter for open fracture type I or II: Secondary | ICD-10-CM | POA: Diagnosis not present

## 2012-04-10 DIAGNOSIS — S7223XB Displaced subtrochanteric fracture of unspecified femur, initial encounter for open fracture type I or II: Secondary | ICD-10-CM | POA: Diagnosis not present

## 2012-04-15 DIAGNOSIS — S7223XB Displaced subtrochanteric fracture of unspecified femur, initial encounter for open fracture type I or II: Secondary | ICD-10-CM | POA: Diagnosis not present

## 2012-05-04 DIAGNOSIS — S7223XB Displaced subtrochanteric fracture of unspecified femur, initial encounter for open fracture type I or II: Secondary | ICD-10-CM | POA: Diagnosis not present

## 2012-05-09 ENCOUNTER — Other Ambulatory Visit: Payer: Self-pay

## 2012-05-13 ENCOUNTER — Encounter: Payer: Self-pay | Admitting: Cardiovascular Disease

## 2012-05-13 ENCOUNTER — Ambulatory Visit (INDEPENDENT_AMBULATORY_CARE_PROVIDER_SITE_OTHER): Payer: Medicare Other | Admitting: Cardiovascular Disease

## 2012-05-13 VITALS — BP 180/100 | HR 78 | Ht 64.0 in | Wt 150.0 lb

## 2012-05-13 DIAGNOSIS — I4891 Unspecified atrial fibrillation: Secondary | ICD-10-CM | POA: Diagnosis not present

## 2012-05-13 DIAGNOSIS — R269 Unspecified abnormalities of gait and mobility: Secondary | ICD-10-CM | POA: Diagnosis not present

## 2012-05-13 DIAGNOSIS — I10 Essential (primary) hypertension: Secondary | ICD-10-CM | POA: Diagnosis not present

## 2012-05-13 DIAGNOSIS — I359 Nonrheumatic aortic valve disorder, unspecified: Secondary | ICD-10-CM | POA: Diagnosis not present

## 2012-05-13 MED ORDER — ISOSORBIDE MONONITRATE ER 30 MG PO TB24: 30.0000 mg | ORAL_TABLET | Freq: Every day | ORAL | Status: DC

## 2012-05-13 MED ORDER — CLONIDINE HCL 0.1 MG PO TABS: 0.1000 mg | ORAL_TABLET | Freq: Once | ORAL | Status: DC

## 2012-05-13 NOTE — Assessment & Plan Note (Signed)
Currently with no symptoms of shortness of breath. No further workup at this time. Periodic echocardiogram to reevaluate.

## 2012-05-13 NOTE — Progress Notes (Signed)
Patient ID: Dawn Cooper, female    DOB: September 28, 1918, 79 y.o.   MRN: [id number]  HPI Comments: Dawn Cooper is a very pleasant 79 year old woman with moderate aortic valve stenosis,  who presented to the hospital on January 02 2011 with shortness of breath, found to be in atrial fibrillation with RVR, elevated BNP, CT scan showing bilateral moderate sized pleural effusions who was started on rate control and pradaxa. She refused cardioversion after 4 weeks on anticoagulation. At approximately 6 weeks out, she had a AVM bleed and presented to the hospital with melena and hematocrit of 18. She had been having melena for at least one week and did not go to the hospital.  She had EGD with treatment of her AVM and now presents to the office for routine followup. All anticoagulation was held.   We had discussed the case with Dr. Marva Panda and it was felt that given her risk of further bleeding from additional AVMs and given her age and the extent of her recent bleed, that no further anticoagulation be used.   She reports having a fall in November 2013. She broke her leg and had surgery with a rod placed on the right. She has recovered well but walks with support.  She reports that she has not had any palpitations. She feels well otherwise. She reports having minimal edema. She dropped her blood pressure cuff and reports that it stopped working well after that is most of her readings were very high. Blood pressure in the office today is 190-200 using her cuff and on manual check. Her pressure cuff seems consistent with our manual check.  Echocardiogram While she was in atrial fibrillation showed ejection fraction 35-45%, normal left ventricular systolic pressures, moderate aortic valve stenosis with mean gradient of 32 mmHg  Previous lab work:  total cholesterol 132, LDL 82, HDL 40, triglycerides 502, hemoglobin A1c 4.9  CT Scan of the chest showing moderate bilateral pleural effusions with compressive  atelectasis in both lower lobes, no PE  EKG shows normal sinus rhythm with frequent APCs in a bigeminal pattern, rate 78 beats per minute   Outpatient Encounter Prescriptions as of 05/13/2012  Medication Sig Dispense Refill  . Cyanocobalamin (VITAMIN B-12 IJ) Inject as directed. Monthly.      . furosemide (LASIX) 20 MG tablet Take 1 tablet (20 mg total) by mouth every other day.  90 tablet  3  . lisinopril (PRINIVIL,ZESTRIL) 40 MG tablet Take 1 tablet (40 mg total) by mouth daily.  90 tablet  3  . metoprolol (LOPRESSOR) 25 MG tablet Take 1 tablet (25 mg total) by mouth 2 (two) times daily.  180 tablet  3  . pantoprazole (PROTONIX) 40 MG tablet Take 1 tablet (40 mg total) by mouth daily.  90 tablet  3  . potassium chloride (KLOR-CON M10) 10 MEQ tablet Take 1 tablet (10 mEq total) by mouth daily.  90 tablet  3     Review of Systems  Constitutional: Negative.   HENT: Negative.   Eyes: Negative.   Respiratory: Negative.   Cardiovascular: Negative.   Gastrointestinal: Negative.   Musculoskeletal: Positive for gait problem.  Skin: Negative.   Neurological: Negative.        Occasionally has symptoms of "wooziness" when she walks  Psychiatric/Behavioral: Negative.   All other systems reviewed and are negative.    BP 180/100  Pulse 78  Ht 5\' 4"  (1.626 m)  Wt 150 lb (68.04 kg)  BMI 25.73 kg/m2 Pressure confirmed on  recheck. Automated cuff with systolic pressure 200. Physical Exam  Nursing note and vitals reviewed. Constitutional: She is oriented to person, place, and time. She appears well-developed and well-nourished.  HENT:  Head: Normocephalic.  Nose: Nose normal.  Mouth/Throat: Oropharynx is clear and moist.  Eyes: Conjunctivae are normal. Pupils are equal, round, and reactive to light.  Neck: Normal range of motion. Neck supple. No JVD present.  Cardiovascular: Regular rhythm, S1 normal, S2 normal and intact distal pulses.  Bradycardia present.  Exam reveals no gallop and no  friction rub.   Murmur heard.  Crescendo systolic murmur is present with a grade of 2/6  Pulmonary/Chest: Effort normal and breath sounds normal. No respiratory distress. She has no wheezes. She has no rales. She exhibits no tenderness.  Abdominal: Soft. Bowel sounds are normal. She exhibits no distension. There is no tenderness.  Musculoskeletal: Normal range of motion. She exhibits no edema and no tenderness.  Trace edema to the low shins bilaterally  Lymphadenopathy:    She has no cervical adenopathy.  Neurological: She is alert and oriented to person, place, and time. Coordination normal.  Skin: Skin is warm and dry. No rash noted. No erythema.  Psychiatric: She has a normal mood and affect. Her behavior is normal. Judgment and thought content normal.    Assessment and Plan

## 2012-05-13 NOTE — Assessment & Plan Note (Addendum)
Blood pressure very high today, systolic greater than 200. We'll continue her on her current medications, add clonidine 0.1 mg twice a day, add isosorbide 30 mg daily. Would avoid calcium channel blockers as she has lower extremity edema. Could add HCTZ in the future. Cautious given her age and potential for electrolyte abnormalities.

## 2012-05-13 NOTE — Assessment & Plan Note (Signed)
Recent fall and hip fracture on the right. Status post surgery.

## 2012-05-13 NOTE — Patient Instructions (Addendum)
Please start clonidine 0.1 mg twice a day Start isosorbide 30 mg once a day for blood pressure  Please monitor your blood pressure at home Write down your numbers  Please call us if you have new issues that need to be addressed before your next appt.  Your physician wants you to follow-up in: 2 to 3 weeks

## 2012-06-05 ENCOUNTER — Ambulatory Visit (INDEPENDENT_AMBULATORY_CARE_PROVIDER_SITE_OTHER): Payer: Medicare Other | Admitting: Cardiovascular Disease

## 2012-06-05 ENCOUNTER — Encounter: Payer: Self-pay | Admitting: Cardiovascular Disease

## 2012-06-05 VITALS — BP 162/90 | HR 75 | Ht 64.0 in | Wt 152.8 lb

## 2012-06-05 DIAGNOSIS — R0602 Shortness of breath: Secondary | ICD-10-CM

## 2012-06-05 DIAGNOSIS — R609 Edema, unspecified: Secondary | ICD-10-CM

## 2012-06-05 DIAGNOSIS — I10 Essential (primary) hypertension: Secondary | ICD-10-CM | POA: Diagnosis not present

## 2012-06-05 DIAGNOSIS — R0609 Other forms of dyspnea: Secondary | ICD-10-CM | POA: Diagnosis not present

## 2012-06-05 DIAGNOSIS — R0989 Other specified symptoms and signs involving the circulatory and respiratory systems: Secondary | ICD-10-CM

## 2012-06-05 DIAGNOSIS — I4891 Unspecified atrial fibrillation: Secondary | ICD-10-CM

## 2012-06-05 DIAGNOSIS — I350 Nonrheumatic aortic (valve) stenosis: Secondary | ICD-10-CM

## 2012-06-05 NOTE — Assessment & Plan Note (Signed)
We have suggested she increase her clonidine to 0.2 mg in the evening, continue 0.1 mg in the morning. If blood pressure continues to be high, she should change her clonidine to 0.2 mg twice a day.

## 2012-06-05 NOTE — Assessment & Plan Note (Signed)
Moderate aortic valve stenosis could be contributing to fluid retention. Lasix daily

## 2012-06-05 NOTE — Progress Notes (Signed)
Patient ID: Dawn Cooper, female    DOB: [birth year], 79 y.o.   MRN: [id number]  HPI Comments: Dawn Cooper is a very pleasant 79 year old woman with moderate aortic valve stenosis,  who presented to the hospital on January 02 2011 with shortness of breath, found to be in atrial fibrillation with RVR, elevated BNP, CT scan showing bilateral moderate sized pleural effusions who was started on rate control and pradaxa. She refused cardioversion after 4 weeks on anticoagulation. At approximately 6 weeks out, she had a AVM bleed and presented to the hospital with melena and hematocrit of 18. She had been having melena for at least one week and did not go to the hospital.  She had EGD with treatment of her AVM. All anticoagulation was held.   We had discussed the case with Dr. Marva Panda and it was felt that given her risk of further bleeding from additional AVMs and given her age and the extent of her recent bleed, that no further anticoagulation be used.    fall in November 2013. She broke her leg and had surgery with a rod placed on the right. She has recovered well but walks with support. She has had difficult to control blood pressure. Her last clinic visit, we added clonidine 0.1 mg twice a day with isosorbide 30 mg daily. Blood pressure continues to be high at home. She also has some edema. She is taking Lasix every other day. Otherwise she feels well  Echocardiogram while she was in atrial fibrillation showed ejection fraction 35-45%, normal left ventricular systolic pressures, moderate aortic valve stenosis with mean gradient of 32 mmHg  Previous lab work:  total cholesterol 132, LDL 82, HDL 40, triglycerides 502, hemoglobin A1c 4.9  CT Scan of the chest showing moderate bilateral pleural effusions with compressive atelectasis in both lower lobes, no PE  EKG shows normal sinus rhythm with rate 75 beats per minute, with frequent APCs in a bigeminal pattern, intraventricular conduction delay, rate  78 beats per minute   Outpatient Encounter Prescriptions as of 06/05/2012  Medication Sig Dispense Refill  . cloNIDine (CATAPRES) 0.1 MG tablet Take 1 tablet (0.1 mg total) by mouth once.  60 tablet  11  . furosemide (LASIX) 20 MG tablet Take 1 tablet (20 mg total) by mouth every other day.  90 tablet  3  . isosorbide mononitrate (IMDUR) 30 MG 24 hr tablet Take 1 tablet (30 mg total) by mouth daily.  30 tablet  6  . lisinopril (PRINIVIL,ZESTRIL) 40 MG tablet Take 1 tablet (40 mg total) by mouth daily.  90 tablet  3  . metoprolol (LOPRESSOR) 50 MG tablet Take 25 mg by mouth 2 (two) times daily.      . pantoprazole (PROTONIX) 40 MG tablet Take 1 tablet (40 mg total) by mouth daily.  90 tablet  3  . potassium chloride (K-DUR,KLOR-CON) 10 MEQ tablet Take 10 mEq by mouth every other day.        Review of Systems  Constitutional: Negative.   HENT: Negative.   Eyes: Negative.   Respiratory: Negative.   Cardiovascular: Negative.   Gastrointestinal: Negative.   Musculoskeletal: Positive for gait problem.  Skin: Negative.   Neurological: Negative.        Occasionally has symptoms of "wooziness" when she walks  Psychiatric/Behavioral: Negative.   All other systems reviewed and are negative.    BP 162/90  Pulse 75  Ht 5\' 4"  (1.626 m)  Wt 152 lb 12 oz (69.287 kg)  BMI  26.21 kg/m2  Physical Exam  Nursing note and vitals reviewed. Constitutional: She is oriented to person, place, and time. She appears well-developed and well-nourished.  HENT:  Head: Normocephalic.  Nose: Nose normal.  Mouth/Throat: Oropharynx is clear and moist.  Eyes: Conjunctivae are normal. Pupils are equal, round, and reactive to light.  Neck: Normal range of motion. Neck supple. No JVD present.  Cardiovascular: Regular rhythm, S1 normal, S2 normal and intact distal pulses.  Bradycardia present.  Exam reveals no gallop and no friction rub.   Murmur heard.  Crescendo systolic murmur is present with a grade of 2/6   Pulmonary/Chest: Effort normal and breath sounds normal. No respiratory distress. She has no wheezes. She has no rales. She exhibits no tenderness.  Abdominal: Soft. Bowel sounds are normal. She exhibits no distension. There is no tenderness.  Musculoskeletal: Normal range of motion. She exhibits no edema and no tenderness.  Trace edema to the low shins bilaterally  Lymphadenopathy:    She has no cervical adenopathy.  Neurological: She is alert and oriented to person, place, and time. Coordination normal.  Skin: Skin is warm and dry. No rash noted. No erythema.  Psychiatric: She has a normal mood and affect. Her behavior is normal. Judgment and thought content normal.    Assessment and Plan

## 2012-06-05 NOTE — Assessment & Plan Note (Signed)
Maintaining normal sinus rhythm with APCs.

## 2012-06-05 NOTE — Assessment & Plan Note (Signed)
She does report having some cough at nighttime when she is supine, possibly from fluid overload. We'll increase Lasix to daily

## 2012-06-05 NOTE — Patient Instructions (Addendum)
You are doing well. Please increase the clonidine to 0.2 mg (two pills at night), continue one pil in the AM Monitor your blood pressure for two weeks If it continue to run high, increase the clonidine to two pills morning and night  Please try extra lasix for edema, ok to take every day if needed.   Please call us if you have new issues that need to be addressed before your next appt.  Your physician wants you to follow-up in: 2 months.

## 2012-06-05 NOTE — Assessment & Plan Note (Signed)
Slight worsening of the edema. Difficult to control blood pressure. We have suggested she take Lasix daily and watch her fluid intake. Possible diastolic CHF, mild

## 2012-06-24 ENCOUNTER — Ambulatory Visit (INDEPENDENT_AMBULATORY_CARE_PROVIDER_SITE_OTHER): Payer: Medicare Other | Admitting: Internal Medicine

## 2012-06-24 ENCOUNTER — Encounter: Payer: Self-pay | Admitting: Internal Medicine

## 2012-06-24 VITALS — BP 150/100 | HR 71 | Temp 98.1°F | Wt 148.0 lb

## 2012-06-24 DIAGNOSIS — I10 Essential (primary) hypertension: Secondary | ICD-10-CM | POA: Diagnosis not present

## 2012-06-24 DIAGNOSIS — D649 Anemia, unspecified: Secondary | ICD-10-CM

## 2012-06-24 DIAGNOSIS — M2550 Pain in unspecified joint: Secondary | ICD-10-CM

## 2012-06-24 DIAGNOSIS — H353190 Nonexudative age-related macular degeneration, unspecified eye, stage unspecified: Secondary | ICD-10-CM | POA: Diagnosis not present

## 2012-06-24 DIAGNOSIS — Z961 Presence of intraocular lens: Secondary | ICD-10-CM | POA: Diagnosis not present

## 2012-06-24 DIAGNOSIS — E538 Deficiency of other specified B group vitamins: Secondary | ICD-10-CM | POA: Diagnosis not present

## 2012-06-24 DIAGNOSIS — D509 Iron deficiency anemia, unspecified: Secondary | ICD-10-CM

## 2012-06-24 DIAGNOSIS — D510 Vitamin B12 deficiency anemia due to intrinsic factor deficiency: Secondary | ICD-10-CM

## 2012-06-24 LAB — FERRITIN: Ferritin: 146.9 ng/mL (ref 10.0–291.0)

## 2012-06-24 LAB — COMPREHENSIVE METABOLIC PANEL
Alkaline Phosphatase: 92 U/L (ref 39–117)
BUN: 17 mg/dL (ref 6–23)
CO2: 28 mEq/L (ref 19–32)
GFR: 44.69 mL/min — ABNORMAL LOW (ref >60.00)
Glucose, Bld: 98 mg/dL (ref 70–99)
Total Bilirubin: 0.6 mg/dL (ref 0.3–1.2)

## 2012-06-24 LAB — CBC WITH DIFFERENTIAL/PLATELET
Basophils Relative: 0.4 % (ref 0.0–3.0)
Eosinophils Relative: 4.4 % (ref 0.0–5.0)
HCT: 36.1 % (ref 36.0–46.0)
Lymphs Abs: 2.2 10*3/uL (ref 0.7–4.0)
MCV: 91.9 fl (ref 78.0–100.0)
Monocytes Absolute: 0.7 10*3/uL (ref 0.1–1.0)
Monocytes Relative: 9.9 % (ref 3.0–12.0)
Neutrophils Relative %: 54.3 % (ref 43.0–77.0)
RBC: 3.92 Mil/uL (ref 3.87–5.11)
WBC: 7 10*3/uL (ref 4.5–10.5)

## 2012-06-24 MED ORDER — CLONIDINE HCL 0.1 MG PO TABS: 0.1000 mg | ORAL_TABLET | Freq: Two times a day (BID) | ORAL | Status: DC

## 2012-06-24 MED ORDER — ISOSORBIDE MONONITRATE ER 30 MG PO TB24: 30.0000 mg | ORAL_TABLET | Freq: Every day | ORAL | Status: DC

## 2012-06-24 MED ORDER — CYANOCOBALAMIN 1000 MCG/ML IJ SOLN
1000.0000 ug | Freq: Once | INTRAMUSCULAR | Status: AC
  Administered 2012-06-24: 1000 ug via INTRAMUSCULAR

## 2012-06-24 NOTE — Assessment & Plan Note (Signed)
BP Readings from Last 3 Encounters:  06/24/12 150/100  06/05/12 162/90  05/13/12 180/100   BP elevated today, however pt has not yet taken Clonidine. Will have her increase Clonidine to twice daily. Follow up with her cardiologist next month and here in 3 months.

## 2012-06-24 NOTE — Progress Notes (Signed)
Subjective:    Patient ID: Dawn Cooper, female    DOB: 05-Dec-1918, 79 y.o.   MRN: [id number]  HPI 79YO female with history of hypertension, atrial fibrillation, and a right femur fracture status post internal fixation in 12/2011 presents for follow up. She reports she is generally doing well. She underwent extensive rehabilitation after right femur fracture in October 2013. She continues to do physical therapy exercises at home. She has some persistent swelling in her right lower extremity. She continues to be followed by orthopedic surgery. She denies any chronic pain in this area. She has not taken any medication for pain.  In regards to history of hypertension and atrial fibrillation, she notes she was recently seen by her cardiologist. She notes that he added both clonidine and Imdur. She has been taking these medications without any noted side effects. She denies chest pain, palpitations, headache. Next  She also notes that in the interim since her last visit, she underwent bilateral cataract removal.  Outpatient Encounter Prescriptions as of 06/24/2012  Medication Sig Dispense Refill  . cloNIDine (CATAPRES) 0.1 MG tablet Take 1 tablet (0.1 mg total) by mouth 2 (two) times daily.  180 tablet  4  . furosemide (LASIX) 20 MG tablet Take 1 tablet (20 mg total) by mouth every other day.  90 tablet  3  . isosorbide mononitrate (IMDUR) 30 MG 24 hr tablet Take 1 tablet (30 mg total) by mouth daily.  90 tablet  4  . lisinopril (PRINIVIL,ZESTRIL) 40 MG tablet Take 1 tablet (40 mg total) by mouth daily.  90 tablet  3  . metoprolol (LOPRESSOR) 50 MG tablet Take 25 mg by mouth 2 (two) times daily.      . pantoprazole (PROTONIX) 40 MG tablet Take 1 tablet (40 mg total) by mouth daily.  90 tablet  3  . potassium chloride (K-DUR,KLOR-CON) 10 MEQ tablet Take 10 mEq by mouth every other day.       No facility-administered encounter medications on file as of 06/24/2012.   BP 150/100  Pulse 71  Temp(Src)  98.1 F (36.7 C) (Oral)  Wt 148 lb (67.132 kg)  BMI 25.39 kg/m2  SpO2 96%  Review of Systems  Constitutional: Negative for fever, chills, appetite change, fatigue and unexpected weight change.  HENT: Negative for ear pain, congestion, sore throat, trouble swallowing, neck pain, voice change and sinus pressure.   Eyes: Negative for visual disturbance.  Respiratory: Negative for cough, shortness of breath, wheezing and stridor.   Cardiovascular: Positive for leg swelling. Negative for chest pain and palpitations.  Gastrointestinal: Negative for nausea, vomiting, abdominal pain, diarrhea, constipation, blood in stool, abdominal distention and anal bleeding.  Genitourinary: Negative for dysuria and flank pain.  Musculoskeletal: Positive for myalgias and arthralgias. Negative for gait problem.  Skin: Negative for color change and rash.  Neurological: Negative for dizziness and headaches.  Hematological: Negative for adenopathy. Does not bruise/bleed easily.  Psychiatric/Behavioral: Negative for suicidal ideas, sleep disturbance and dysphoric mood. The patient is not nervous/anxious.        Objective:   Physical Exam  Constitutional: She is oriented to person, place, and time. She appears well-developed and well-nourished. No distress.  HENT:  Head: Normocephalic and atraumatic.  Right Ear: External ear normal.  Left Ear: External ear normal.  Nose: Nose normal.  Mouth/Throat: Oropharynx is clear and moist. No oropharyngeal exudate.  Eyes: Conjunctivae are normal. Pupils are equal, round, and reactive to light. Right eye exhibits no discharge. Left eye exhibits no  discharge. No scleral icterus.  Neck: Normal range of motion. Neck supple. No tracheal deviation present. No thyromegaly present.  Cardiovascular: Normal rate, regular rhythm, normal heart sounds and intact distal pulses.   Extrasystoles are present. Exam reveals no gallop and no friction rub.   No murmur  heard. Pulmonary/Chest: Effort normal and breath sounds normal. No respiratory distress. She has no wheezes. She has no rales. She exhibits no tenderness.  Musculoskeletal: Normal range of motion. She exhibits no edema and no tenderness.       Right ankle: She exhibits swelling.       Left ankle: She exhibits swelling.  Lymphadenopathy:    She has no cervical adenopathy.  Neurological: She is alert and oriented to person, place, and time. No cranial nerve deficit. She exhibits normal muscle tone. Coordination normal.  Skin: Skin is warm and dry. No rash noted. She is not diaphoretic. No erythema. No pallor.  Psychiatric: She has a normal mood and affect. Her behavior is normal. Judgment and thought content normal.          Assessment & Plan:

## 2012-06-24 NOTE — Assessment & Plan Note (Signed)
History of pernicious anemia. Has been off B12 supplementation because of the national shortage. T12 shot given today. Recommended weekly shots x3 then monthly shots.

## 2012-06-24 NOTE — Assessment & Plan Note (Signed)
H/o iron def anemia. Will check CBC with labs today. 

## 2012-06-24 NOTE — Patient Instructions (Signed)
Start B12 shots weekly x 3 weeks, then monthly.  Follow up 3 months and prn.

## 2012-08-12 ENCOUNTER — Ambulatory Visit (INDEPENDENT_AMBULATORY_CARE_PROVIDER_SITE_OTHER): Payer: Medicare Other | Admitting: Cardiovascular Disease

## 2012-08-12 ENCOUNTER — Encounter: Payer: Self-pay | Admitting: Cardiovascular Disease

## 2012-08-12 VITALS — BP 152/98 | HR 63 | Ht 64.0 in | Wt 147.2 lb

## 2012-08-12 DIAGNOSIS — R609 Edema, unspecified: Secondary | ICD-10-CM

## 2012-08-12 DIAGNOSIS — I10 Essential (primary) hypertension: Secondary | ICD-10-CM

## 2012-08-12 DIAGNOSIS — R269 Unspecified abnormalities of gait and mobility: Secondary | ICD-10-CM

## 2012-08-12 DIAGNOSIS — I4891 Unspecified atrial fibrillation: Secondary | ICD-10-CM | POA: Diagnosis not present

## 2012-08-12 DIAGNOSIS — R2681 Unsteadiness on feet: Secondary | ICD-10-CM

## 2012-08-12 MED ORDER — HYDRALAZINE HCL 25 MG PO TABS: 25.0000 mg | ORAL_TABLET | Freq: Three times a day (TID) | ORAL | Status: DC

## 2012-08-12 MED ORDER — ISOSORBIDE MONONITRATE ER 30 MG PO TB24: 30.0000 mg | ORAL_TABLET | Freq: Two times a day (BID) | ORAL | Status: DC

## 2012-08-12 NOTE — Assessment & Plan Note (Signed)
Appears to be maintaining normal sinus rhythm with frequent APCs

## 2012-08-12 NOTE — Patient Instructions (Addendum)
Please hold the clonidine Please increase the isosorbide twice a day Please start hydralazine 25 mg three times a day for blood pressure >150 on the top,  >90 on the bottom   Please call us if you have new issues that need to be addressed before your next appt.  Your physician wants you to follow-up in: 3 months.  You will receive a reminder letter in the mail two months in advance. If you don't receive a letter, please call our office to schedule the follow-up appointment.

## 2012-08-12 NOTE — Progress Notes (Signed)
Patient ID: Dawn Cooper, female    DOB: [birth year], 79 y.o.   MRN: [id number]  HPI Comments: Dawn Cooper is a very pleasant 79 year old woman with moderate aortic valve stenosis,  who presented to the hospital on January 02 2011 with shortness of breath, found to be in atrial fibrillation with RVR, elevated BNP, CT scan showing bilateral moderate sized pleural effusions who was started on rate control and pradaxa. She refused cardioversion after 4 weeks on anticoagulation. At approximately 6 weeks out, she had a AVM bleed and presented to the hospital with melena and hematocrit of 18. She had been having melena for at least one week and did not go to the hospital.  She had EGD with treatment of her AVM. All anticoagulation was held.   We had discussed the case with Dr. Marva Panda and it was felt that given her risk of further bleeding from additional AVMs and given her age and the extent of her recent bleed, that no further anticoagulation be used.    fall in November 2013. She broke her leg and had surgery with a rod placed on the right. She has recovered well but walks with support. She has had difficult to control blood pressure.  Blood pressure continues to be high. She has general malaise but does not know why. She attributes her symptoms to the clonidine. She's not sleeping well. Balance is poor her family who presented with her today. Does not seem to have side effects on isosorbide. Also feels that clonidine is causing a rash.  Echocardiogram while she was in atrial fibrillation showed ejection fraction 35-45%, normal left ventricular systolic pressures, moderate aortic valve stenosis with mean gradient of 32 mmHg  Previous lab work:  total cholesterol 132, LDL 82, HDL 40, triglycerides 502, hemoglobin A1c 4.9  CT Scan of the chest showing moderate bilateral pleural effusions with compressive atelectasis in both lower lobes, no PE  EKG shows normal sinus rhythm with frequent APCs in a  bigeminal pattern   Outpatient Encounter Prescriptions as of 08/12/2012  Medication Sig Dispense Refill  . Acetaminophen (TYLENOL PO) Take by mouth. Takes 1-2 tablets prn.      . furosemide (LASIX) 20 MG tablet Take 1 tablet (20 mg total) by mouth every other day.  90 tablet  3  . lisinopril (PRINIVIL,ZESTRIL) 40 MG tablet Take 1 tablet (40 mg total) by mouth daily.  90 tablet  3  . metoprolol (LOPRESSOR) 50 MG tablet Take 25 mg by mouth 2 (two) times daily.      . pantoprazole (PROTONIX) 40 MG tablet Take 1 tablet (40 mg total) by mouth daily.  90 tablet  3  . potassium chloride (K-DUR,KLOR-CON) 10 MEQ tablet Take 10 mEq by mouth every other day.      .  cloNIDine (CATAPRES) 0.1 MG tablet Take 1 tablet (0.1 mg total) by mouth 2 (two) times daily.  180 tablet  4  .  isosorbide mononitrate (IMDUR) 30 MG 24 hr tablet Take 1 tablet (30 mg total) by mouth daily.  90 tablet  4   Review of Systems  Constitutional: Positive for fatigue.  HENT: Negative.   Eyes: Negative.   Respiratory: Negative.   Cardiovascular: Negative.   Gastrointestinal: Negative.   Musculoskeletal: Positive for gait problem.  Skin: Negative.   Neurological: Negative.        Occasionally has symptoms of "wooziness" when she walks  Psychiatric/Behavioral: Negative.   All other systems reviewed and are negative.    BP  152/98  Pulse 63  Ht 5\' 4"  (1.626 m)  Wt 147 lb 4 oz (66.792 kg)  BMI 25.26 kg/m2  Physical Exam  Nursing note and vitals reviewed. Constitutional: She is oriented to person, place, and time. She appears well-developed and well-nourished.  HENT:  Head: Normocephalic.  Nose: Nose normal.  Mouth/Throat: Oropharynx is clear and moist.  Eyes: Conjunctivae are normal. Pupils are equal, round, and reactive to light.  Neck: Normal range of motion. Neck supple. No JVD present.  Cardiovascular: Regular rhythm, S1 normal, S2 normal and intact distal pulses.  Bradycardia present.  Exam reveals no gallop and  no friction rub.   Murmur heard.  Crescendo systolic murmur is present with a grade of 2/6  Pulmonary/Chest: Effort normal and breath sounds normal. No respiratory distress. She has no wheezes. She has no rales. She exhibits no tenderness.  Abdominal: Soft. Bowel sounds are normal. She exhibits no distension. There is no tenderness.  Musculoskeletal: Normal range of motion. She exhibits no edema and no tenderness.  Trace edema to the low shins bilaterally  Lymphadenopathy:    She has no cervical adenopathy.  Neurological: She is alert and oriented to person, place, and time. Coordination normal.  Skin: Skin is warm and dry. No rash noted. No erythema.  Psychiatric: She has a normal mood and affect. Her behavior is normal. Judgment and thought content normal.    Assessment and Plan

## 2012-08-12 NOTE — Assessment & Plan Note (Signed)
Edema stable. Encouraged her to continue on Lasix every other day. If edema gets worse, would take Lasix daily. Renal function is normal

## 2012-08-12 NOTE — Assessment & Plan Note (Signed)
She has general malaise and rash and is attributing her symptoms possibly to clonidine though she does not know for sure. We will hold the clonidine, increase her isosorbide to 30 mg twice a day, start hydralazine 25 mg 3 times a day

## 2012-08-12 NOTE — Assessment & Plan Note (Signed)
Family reports significant gait instability. No recent falls

## 2012-09-23 ENCOUNTER — Encounter: Payer: Self-pay | Admitting: Internal Medicine

## 2012-09-23 ENCOUNTER — Ambulatory Visit (INDEPENDENT_AMBULATORY_CARE_PROVIDER_SITE_OTHER): Payer: Medicare Other | Admitting: Internal Medicine

## 2012-09-23 VITALS — BP 142/80 | HR 64 | Temp 98.5°F | Wt 150.0 lb

## 2012-09-23 DIAGNOSIS — I4891 Unspecified atrial fibrillation: Secondary | ICD-10-CM

## 2012-09-23 DIAGNOSIS — D510 Vitamin B12 deficiency anemia due to intrinsic factor deficiency: Secondary | ICD-10-CM | POA: Diagnosis not present

## 2012-09-23 DIAGNOSIS — I10 Essential (primary) hypertension: Secondary | ICD-10-CM

## 2012-09-23 LAB — COMPREHENSIVE METABOLIC PANEL
ALT: 16 U/L (ref 0–35)
Alkaline Phosphatase: 73 U/L (ref 39–117)
Glucose, Bld: 103 mg/dL — ABNORMAL HIGH (ref 70–99)
Sodium: 136 mEq/L (ref 135–145)
Total Bilirubin: 0.9 mg/dL (ref 0.3–1.2)
Total Protein: 7.1 g/dL (ref 6.0–8.3)

## 2012-09-23 MED ORDER — CYANOCOBALAMIN 1000 MCG/ML IJ SOLN
1000.0000 ug | Freq: Once | INTRAMUSCULAR | Status: AC
  Administered 2012-09-23: 1000 ug via INTRAMUSCULAR

## 2012-09-23 NOTE — Assessment & Plan Note (Signed)
BP Readings from Last 3 Encounters:  09/23/12 142/80  08/12/12 152/98  06/24/12 150/100   BP better controlled on current medications. Will continue. Will check renal function with labs today.

## 2012-09-23 NOTE — Progress Notes (Signed)
Subjective:    Patient ID: Dawn Cooper, female    DOB: 13-Dec-1918, 79 y.o.   MRN: [id number]  HPI 79 year old female with history of atrial fibrillation, hypertension, GI bleeding presents for followup. She reports she is generally been feeling well. She denies any palpitations, chest pain, shortness of breath. She notes that her cardiologist recently changed her clonidine to hydralazine because of rash with use of clonidine.  She notes some ongoing mild fatigue. She questions whether this might be attributed to B12 deficiency. She has been unable to receive B12 because of national shortage of this medication. She has not had any recent gait abnormalities or falls.  Outpatient Encounter Prescriptions as of 09/23/2012  Medication Sig Dispense Refill  . Acetaminophen (TYLENOL PO) Take by mouth. Takes 1-2 tablets prn.      . furosemide (LASIX) 20 MG tablet Take 1 tablet (20 mg total) by mouth every other day.  90 tablet  3  . hydrALAZINE (APRESOLINE) 25 MG tablet Take 1 tablet (25 mg total) by mouth 3 (three) times daily.  90 tablet  6  . isosorbide mononitrate (IMDUR) 30 MG 24 hr tablet Take 1 tablet (30 mg total) by mouth 2 (two) times daily.  180 tablet  3  . lisinopril (PRINIVIL,ZESTRIL) 40 MG tablet Take 1 tablet (40 mg total) by mouth daily.  90 tablet  3  . metoprolol (LOPRESSOR) 50 MG tablet Take 25 mg by mouth 2 (two) times daily.      . pantoprazole (PROTONIX) 40 MG tablet Take 1 tablet (40 mg total) by mouth daily.  90 tablet  3  . potassium chloride (K-DUR,KLOR-CON) 10 MEQ tablet Take 10 mEq by mouth every other day.      . [EXPIRED] cyanocobalamin ((VITAMIN B-12)) injection 1,000 mcg        No facility-administered encounter medications on file as of 09/23/2012.   BP 142/80  Pulse 64  Temp(Src) 98.5 F (36.9 C) (Oral)  Wt 150 lb (68.04 kg)  BMI 25.73 kg/m2  SpO2 97%  Review of Systems  Constitutional: Positive for fatigue. Negative for fever, chills, appetite change and  unexpected weight change.  HENT: Negative for ear pain, congestion, sore throat, trouble swallowing, neck pain, voice change and sinus pressure.   Eyes: Negative for visual disturbance.  Respiratory: Negative for cough, shortness of breath, wheezing and stridor.   Cardiovascular: Negative for chest pain, palpitations and leg swelling.  Gastrointestinal: Negative for nausea, vomiting, abdominal pain, diarrhea, constipation, blood in stool, abdominal distention and anal bleeding.  Genitourinary: Negative for dysuria and flank pain.  Musculoskeletal: Negative for myalgias, arthralgias and gait problem.  Skin: Negative for color change and rash.  Neurological: Negative for dizziness and headaches.  Hematological: Negative for adenopathy. Does not bruise/bleed easily.  Psychiatric/Behavioral: Negative for suicidal ideas, sleep disturbance and dysphoric mood. The patient is not nervous/anxious.        Objective:   Physical Exam  Constitutional: She is oriented to person, place, and time. She appears well-developed and well-nourished. No distress.  HENT:  Head: Normocephalic and atraumatic.  Right Ear: External ear normal.  Left Ear: External ear normal.  Nose: Nose normal.  Mouth/Throat: Oropharynx is clear and moist. No oropharyngeal exudate.  Eyes: Conjunctivae are normal. Pupils are equal, round, and reactive to light. Right eye exhibits no discharge. Left eye exhibits no discharge. No scleral icterus.  Neck: Normal range of motion. Neck supple. No tracheal deviation present. No thyromegaly present.  Cardiovascular: Normal rate, regular rhythm, normal heart  sounds and intact distal pulses.  Exam reveals no gallop and no friction rub.   No murmur heard. Pulmonary/Chest: Effort normal and breath sounds normal. No accessory muscle usage. Not tachypneic. No respiratory distress. She has no decreased breath sounds. She has no wheezes. She has no rhonchi. She has no rales. She exhibits no  tenderness.  Musculoskeletal: Normal range of motion. She exhibits no edema and no tenderness.  Lymphadenopathy:    She has no cervical adenopathy.  Neurological: She is alert and oriented to person, place, and time. No cranial nerve deficit. She exhibits normal muscle tone. Coordination normal.  Skin: Skin is warm and dry. No rash noted. She is not diaphoretic. No erythema. No pallor.  Psychiatric: She has a normal mood and affect. Her behavior is normal. Judgment and thought content normal.          Assessment & Plan:

## 2012-09-23 NOTE — Assessment & Plan Note (Signed)
H/o pernicious anemia. Will give B12 injection today.

## 2012-09-23 NOTE — Assessment & Plan Note (Signed)
Normal sinus rhythm today. Asymptomatic. No anticoagulation because of h/o GI bleeding. Will continue current medications.

## 2012-09-29 ENCOUNTER — Ambulatory Visit (INDEPENDENT_AMBULATORY_CARE_PROVIDER_SITE_OTHER): Payer: Medicare Other | Admitting: *Deleted

## 2012-09-29 ENCOUNTER — Other Ambulatory Visit: Payer: Self-pay | Admitting: *Deleted

## 2012-09-29 DIAGNOSIS — K219 Gastro-esophageal reflux disease without esophagitis: Secondary | ICD-10-CM

## 2012-09-29 DIAGNOSIS — E538 Deficiency of other specified B group vitamins: Secondary | ICD-10-CM | POA: Diagnosis not present

## 2012-09-29 DIAGNOSIS — R609 Edema, unspecified: Secondary | ICD-10-CM

## 2012-09-29 MED ORDER — FUROSEMIDE 20 MG PO TABS: 20.0000 mg | ORAL_TABLET | ORAL | Status: DC

## 2012-09-29 MED ORDER — CYANOCOBALAMIN 1000 MCG/ML IJ SOLN
1000.0000 ug | Freq: Once | INTRAMUSCULAR | Status: AC
  Administered 2012-09-29: 1000 ug via INTRAMUSCULAR

## 2012-09-29 MED ORDER — PANTOPRAZOLE SODIUM 40 MG PO TBEC: 40.0000 mg | DELAYED_RELEASE_TABLET | Freq: Every day | ORAL | Status: DC

## 2012-09-29 MED ORDER — LISINOPRIL 40 MG PO TABS: 40.0000 mg | ORAL_TABLET | Freq: Every day | ORAL | Status: DC

## 2012-09-30 ENCOUNTER — Ambulatory Visit: Payer: Medicare Other

## 2012-10-02 ENCOUNTER — Ambulatory Visit: Payer: Medicare Other

## 2012-10-07 ENCOUNTER — Ambulatory Visit (INDEPENDENT_AMBULATORY_CARE_PROVIDER_SITE_OTHER): Payer: Medicare Other | Admitting: *Deleted

## 2012-10-07 DIAGNOSIS — E538 Deficiency of other specified B group vitamins: Secondary | ICD-10-CM | POA: Diagnosis not present

## 2012-10-07 MED ORDER — CYANOCOBALAMIN 1000 MCG/ML IJ SOLN
1000.0000 ug | Freq: Once | INTRAMUSCULAR | Status: AC
  Administered 2012-10-07: 1000 ug via INTRAMUSCULAR

## 2012-11-12 ENCOUNTER — Ambulatory Visit (INDEPENDENT_AMBULATORY_CARE_PROVIDER_SITE_OTHER): Payer: Medicare Other | Admitting: Cardiovascular Disease

## 2012-11-12 ENCOUNTER — Encounter: Payer: Self-pay | Admitting: Cardiovascular Disease

## 2012-11-12 ENCOUNTER — Ambulatory Visit (INDEPENDENT_AMBULATORY_CARE_PROVIDER_SITE_OTHER): Payer: Medicare Other | Admitting: *Deleted

## 2012-11-12 VITALS — BP 140/78 | HR 53 | Ht 62.0 in | Wt 152.0 lb

## 2012-11-12 DIAGNOSIS — I4891 Unspecified atrial fibrillation: Secondary | ICD-10-CM

## 2012-11-12 DIAGNOSIS — E538 Deficiency of other specified B group vitamins: Secondary | ICD-10-CM | POA: Diagnosis not present

## 2012-11-12 DIAGNOSIS — I359 Nonrheumatic aortic valve disorder, unspecified: Secondary | ICD-10-CM | POA: Diagnosis not present

## 2012-11-12 DIAGNOSIS — I10 Essential (primary) hypertension: Secondary | ICD-10-CM | POA: Diagnosis not present

## 2012-11-12 DIAGNOSIS — I350 Nonrheumatic aortic (valve) stenosis: Secondary | ICD-10-CM

## 2012-11-12 MED ORDER — METOPROLOL TARTRATE 25 MG PO TABS: 25.0000 mg | ORAL_TABLET | Freq: Two times a day (BID) | ORAL | Status: DC

## 2012-11-12 MED ORDER — CYANOCOBALAMIN 1000 MCG/ML IJ SOLN
1000.0000 ug | Freq: Once | INTRAMUSCULAR | Status: AC
  Administered 2012-11-12: 1000 ug via INTRAMUSCULAR

## 2012-11-12 NOTE — Assessment & Plan Note (Signed)
Moderate aortic valve stenosis. Repeat echocardiogram next year

## 2012-11-12 NOTE — Progress Notes (Signed)
Patient ID: Dawn Cooper, female    DOB: 09-14-1918, 79 y.o.   MRN: [id number]  HPI Comments: Dawn Cooper is a very pleasant 57 -year-old woman with moderate aortic valve stenosis,  who presented to the hospital on January 02 2011 with shortness of breath, found to be in atrial fibrillation with RVR, elevated BNP, CT scan showing bilateral moderate sized pleural effusions who was started on rate control and pradaxa. She refused cardioversion after 4 weeks on anticoagulation. At approximately 6 weeks out, she had a AVM bleed and presented to the hospital with melena and hematocrit of 18. She had been having melena for at least one week and did not go to the hospital.  She had EGD with treatment of her AVM. All anticoagulation was held.   We had discussed the case with Dr. Marva Panda and it was felt that given her risk of further bleeding from additional AVMs and given her age and the extent of her recent bleed, that no further anticoagulation be used.    fall in November 2013. She broke her leg and had surgery with a rod placed on the right. She has recovered well but walks with support. She has had difficult to control blood pressure.  On her last clinic visit, blood pressure was high. She felt she was having side effects from clonidine. Isosorbide dose was increased. She presents today and reports blood pressure is well controlled. Numbers typically run 110-130 systolic. Frequent heart rates in the 50s, occasional low systolic pressure. 2 episodes of dizziness while in the garden. Otherwise feels she is stable  Echocardiogram while she was in atrial fibrillation showed ejection fraction 35-45%, normal left ventricular systolic pressures, moderate aortic valve stenosis with mean gradient of 32 mmHg  Previous lab work:  total cholesterol 132, LDL 82, HDL 40, triglycerides 502, hemoglobin A1c 4.9  Previous CT Scan of the chest showing moderate bilateral pleural effusions with compressive atelectasis  in both lower lobes, no PE  EKG shows normal sinus rhythm  with heart rate 53 beats per minute left bundle branch block   Outpatient Encounter Prescriptions as of 11/12/2012  Medication Sig Dispense Refill  . Acetaminophen (TYLENOL PO) Take by mouth. Takes 1-2 tablets prn.      . furosemide (LASIX) 20 MG tablet Take 1 tablet (20 mg total) by mouth every other day.  45 tablet  3  . hydrALAZINE (APRESOLINE) 25 MG tablet Take 1 tablet (25 mg total) by mouth 3 (three) times daily.  90 tablet  6  . isosorbide mononitrate (IMDUR) 30 MG 24 hr tablet Take 1 tablet (30 mg total) by mouth 2 (two) times daily.  180 tablet  3  . lisinopril (PRINIVIL,ZESTRIL) 40 MG tablet Take 1 tablet (40 mg total) by mouth daily.  90 tablet  3  . metoprolol (LOPRESSOR) 50 MG tablet Take 25 mg by mouth 2 (two) times daily.      . pantoprazole (PROTONIX) 40 MG tablet Take 1 tablet (40 mg total) by mouth daily.  90 tablet  3  . potassium chloride (K-DUR,KLOR-CON) 10 MEQ tablet Take 10 mEq by mouth every other day.       No facility-administered encounter medications on file as of 11/12/2012.    Review of Systems  Constitutional: Positive for fatigue.  HENT: Negative.   Eyes: Negative.   Respiratory: Negative.   Cardiovascular: Negative.   Gastrointestinal: Negative.   Musculoskeletal: Positive for gait problem.  Skin: Negative.   Neurological: Negative.  Occasionally has symptoms of "wooziness" when she walks  Psychiatric/Behavioral: Negative.   All other systems reviewed and are negative.    BP 140/78  Pulse 53  Ht 5\' 2"  (1.575 m)  Wt 152 lb (68.947 kg)  BMI 27.79 kg/m2  Physical Exam  Nursing note and vitals reviewed. Constitutional: She is oriented to person, place, and time. She appears well-developed and well-nourished.  HENT:  Head: Normocephalic.  Nose: Nose normal.  Mouth/Throat: Oropharynx is clear and moist.  Eyes: Conjunctivae are normal. Pupils are equal, round, and reactive to  light.  Neck: Normal range of motion. Neck supple. No JVD present.  Cardiovascular: Regular rhythm, S1 normal, S2 normal and intact distal pulses.  Bradycardia present.  Exam reveals no gallop and no friction rub.   Murmur heard.  Crescendo systolic murmur is present with a grade of 2/6  Pulmonary/Chest: Effort normal and breath sounds normal. No respiratory distress. She has no wheezes. She has no rales. She exhibits no tenderness.  Abdominal: Soft. Bowel sounds are normal. She exhibits no distension. There is no tenderness.  Musculoskeletal: Normal range of motion. She exhibits no edema and no tenderness.  Trace edema to the low shins bilaterally  Lymphadenopathy:    She has no cervical adenopathy.  Neurological: She is alert and oriented to person, place, and time. Coordination normal.  Skin: Skin is warm and dry. No rash noted. No erythema.  Psychiatric: She has a normal mood and affect. Her behavior is normal. Judgment and thought content normal.    Assessment and Plan

## 2012-11-12 NOTE — Assessment & Plan Note (Signed)
Maintaining normal sinus rhythm.  we'll decrease metoprolol dosing given bradycardia and near syncope/dizziness

## 2012-11-12 NOTE — Patient Instructions (Addendum)
You are doing well. Please decrease the metoprolol to 12.5 mg twice a day (cut the 25 mg pill in 1/2) Please cut the lisinopril in 1/2 daily Continue on hydralazine twice a day  Please call us if you have new issues that need to be addressed before your next appt.  Your physician wants you to follow-up in: 3 months.  You will receive a reminder letter in the mail two months in advance. If you don't receive a letter, please call our office to schedule the follow-up appointment.

## 2012-11-12 NOTE — Assessment & Plan Note (Signed)
We'll decrease lisinopril back to 20 mg daily, For bradycardia we'll decrease metoprolol tartrate down to 12.5 mg twice a day She takes hydralazine 25 mg twice a day. Will stay on this for now

## 2012-12-08 ENCOUNTER — Other Ambulatory Visit: Payer: Self-pay | Admitting: *Deleted

## 2012-12-08 MED ORDER — POTASSIUM CHLORIDE CRYS ER 10 MEQ PO TBCR: 10.0000 meq | EXTENDED_RELEASE_TABLET | ORAL | Status: DC

## 2012-12-08 NOTE — Telephone Encounter (Signed)
Eprescribed.

## 2012-12-16 ENCOUNTER — Ambulatory Visit (INDEPENDENT_AMBULATORY_CARE_PROVIDER_SITE_OTHER): Payer: Medicare Other | Admitting: *Deleted

## 2012-12-16 DIAGNOSIS — E538 Deficiency of other specified B group vitamins: Secondary | ICD-10-CM

## 2012-12-16 MED ORDER — CYANOCOBALAMIN 1000 MCG/ML IJ SOLN
1000.0000 ug | Freq: Once | INTRAMUSCULAR | Status: AC
  Administered 2012-12-16: 1000 ug via INTRAMUSCULAR

## 2012-12-23 DIAGNOSIS — H353190 Nonexudative age-related macular degeneration, unspecified eye, stage unspecified: Secondary | ICD-10-CM | POA: Diagnosis not present

## 2012-12-23 DIAGNOSIS — Z961 Presence of intraocular lens: Secondary | ICD-10-CM | POA: Diagnosis not present

## 2013-01-14 ENCOUNTER — Ambulatory Visit (INDEPENDENT_AMBULATORY_CARE_PROVIDER_SITE_OTHER): Payer: Medicare Other | Admitting: *Deleted

## 2013-01-14 DIAGNOSIS — E538 Deficiency of other specified B group vitamins: Secondary | ICD-10-CM

## 2013-01-14 MED ORDER — CYANOCOBALAMIN 1000 MCG/ML IJ SOLN
1000.0000 ug | Freq: Once | INTRAMUSCULAR | Status: AC
  Administered 2013-01-14: 1000 ug via INTRAMUSCULAR

## 2013-01-28 ENCOUNTER — Other Ambulatory Visit: Payer: Self-pay

## 2013-02-11 ENCOUNTER — Encounter: Payer: Self-pay | Admitting: Internal Medicine

## 2013-02-11 ENCOUNTER — Ambulatory Visit (INDEPENDENT_AMBULATORY_CARE_PROVIDER_SITE_OTHER): Payer: Medicare Other | Admitting: Internal Medicine

## 2013-02-11 VITALS — BP 160/76 | HR 64 | Temp 98.1°F | Wt 151.0 lb

## 2013-02-11 DIAGNOSIS — Z23 Encounter for immunization: Secondary | ICD-10-CM | POA: Diagnosis not present

## 2013-02-11 DIAGNOSIS — I10 Essential (primary) hypertension: Secondary | ICD-10-CM | POA: Diagnosis not present

## 2013-02-11 DIAGNOSIS — I4891 Unspecified atrial fibrillation: Secondary | ICD-10-CM | POA: Diagnosis not present

## 2013-02-11 DIAGNOSIS — D510 Vitamin B12 deficiency anemia due to intrinsic factor deficiency: Secondary | ICD-10-CM

## 2013-02-11 DIAGNOSIS — I350 Nonrheumatic aortic (valve) stenosis: Secondary | ICD-10-CM

## 2013-02-11 DIAGNOSIS — I359 Nonrheumatic aortic valve disorder, unspecified: Secondary | ICD-10-CM

## 2013-02-11 LAB — COMPREHENSIVE METABOLIC PANEL
ALT: 12 U/L (ref 0–35)
CO2: 24 mEq/L (ref 19–32)
Calcium: 9.7 mg/dL (ref 8.4–10.5)
Chloride: 107 mEq/L (ref 96–112)
Creatinine, Ser: 1.2 mg/dL (ref 0.4–1.2)
GFR: 43.37 mL/min — ABNORMAL LOW (ref >60.00)
Glucose, Bld: 97 mg/dL (ref 70–99)
Sodium: 138 mEq/L (ref 135–145)
Total Protein: 6.9 g/dL (ref 6.0–8.3)

## 2013-02-11 LAB — CBC WITH DIFFERENTIAL/PLATELET
Basophils Absolute: 0 10*3/uL (ref 0.0–0.1)
Basophils Relative: 0.5 % (ref 0.0–3.0)
Eosinophils Absolute: 0.2 10*3/uL (ref 0.0–0.7)
Lymphocytes Relative: 34.3 % (ref 12.0–46.0)
Lymphs Abs: 2.1 10*3/uL (ref 0.7–4.0)
MCHC: 34.2 g/dL (ref 30.0–36.0)
MCV: 92.7 fl (ref 78.0–100.0)
Monocytes Absolute: 0.6 10*3/uL (ref 0.1–1.0)
Neutrophils Relative %: 51.7 % (ref 43.0–77.0)
Platelets: 190 10*3/uL (ref 150.0–400.0)
RBC: 3.46 Mil/uL — ABNORMAL LOW (ref 3.87–5.11)
RDW: 12.9 % (ref 11.5–14.6)

## 2013-02-11 NOTE — Assessment & Plan Note (Signed)
Moderate aortic valve stenosis. Reviewed recent cardiology notes. Plan is for repeat echocardiogram in 2015

## 2013-02-11 NOTE — Assessment & Plan Note (Signed)
Will check B12 and CBC with labs today.

## 2013-02-11 NOTE — Assessment & Plan Note (Signed)
BP Readings from Last 3 Encounters:  02/11/13 160/76  11/12/12 140/78  09/23/12 142/80   BP slightly elevated today, but has generally been well controlled. She has follow up with her cardiologist next month.Will continue to monitor.

## 2013-02-11 NOTE — Progress Notes (Signed)
Subjective:    Patient ID: Dawn Cooper, female    DOB: 11/30/1918, 79 y.o.   MRN: [id number]  HPI 79 year old female with history of atrial fibrillation, aortic valve stenosis, hypertension presents for followup. She reports that she is generally been feeling well. She is compliant with medications. She denies any recent chest pain, palpitations, headache, shortness of breath. Appetite has been good. No recent changes in bowel habits. No new concerns today.  Outpatient Encounter Prescriptions as of 02/11/2013  Medication Sig  . Acetaminophen (TYLENOL PO) Take by mouth. Takes 1-2 tablets prn.  . furosemide (LASIX) 20 MG tablet Take 1 tablet (20 mg total) by mouth every other day.  . hydrALAZINE (APRESOLINE) 25 MG tablet Take 1 tablet (25 mg total) by mouth 3 (three) times daily.  . isosorbide mononitrate (IMDUR) 30 MG 24 hr tablet Take 1 tablet (30 mg total) by mouth 2 (two) times daily.  Marland Kitchen lisinopril (PRINIVIL,ZESTRIL) 40 MG tablet Take 1 tablet (40 mg total) by mouth daily.  . metoprolol tartrate (LOPRESSOR) 25 MG tablet Take 1 tablet (25 mg total) by mouth 2 (two) times daily.  . pantoprazole (PROTONIX) 40 MG tablet Take 1 tablet (40 mg total) by mouth daily.  . potassium chloride (K-DUR,KLOR-CON) 10 MEQ tablet Take 1 tablet (10 mEq total) by mouth every other day.   BP 160/76  Pulse 64  Temp(Src) 98.1 F (36.7 C) (Oral)  Wt 151 lb (68.493 kg)  SpO2 97%  Review of Systems  Constitutional: Negative for fever, chills, appetite change, fatigue and unexpected weight change.  HENT: Negative for congestion, ear pain, sinus pressure, sore throat, trouble swallowing and voice change.   Eyes: Negative for visual disturbance.  Respiratory: Negative for cough, shortness of breath, wheezing and stridor.   Cardiovascular: Negative for chest pain, palpitations and leg swelling.  Gastrointestinal: Negative for nausea, vomiting, abdominal pain, diarrhea, constipation, blood in stool, abdominal  distention and anal bleeding.  Genitourinary: Negative for dysuria and flank pain.  Musculoskeletal: Negative for arthralgias, gait problem, myalgias and neck pain.  Skin: Negative for color change and rash.  Neurological: Negative for dizziness and headaches.  Hematological: Negative for adenopathy. Does not bruise/bleed easily.  Psychiatric/Behavioral: Negative for suicidal ideas, sleep disturbance and dysphoric mood. The patient is not nervous/anxious.        Objective:   Physical Exam  Constitutional: She is oriented to person, place, and time. She appears well-developed and well-nourished. No distress.  HENT:  Head: Normocephalic and atraumatic.  Right Ear: External ear normal.  Left Ear: External ear normal.  Nose: Nose normal.  Mouth/Throat: Oropharynx is clear and moist. No oropharyngeal exudate.  Eyes: Conjunctivae are normal. Pupils are equal, round, and reactive to light. Right eye exhibits no discharge. Left eye exhibits no discharge. No scleral icterus.  Neck: Normal range of motion. Neck supple. No tracheal deviation present. No thyromegaly present.  Cardiovascular: Normal rate, regular rhythm and intact distal pulses.  Exam reveals no gallop and no friction rub.   Murmur heard.  Systolic murmur is present  Pulmonary/Chest: Effort normal and breath sounds normal. No accessory muscle usage. Not tachypneic. No respiratory distress. She has no decreased breath sounds. She has no wheezes. She has no rhonchi. She has no rales. She exhibits no tenderness.  Musculoskeletal: Normal range of motion. She exhibits edema (non-piting ankles). She exhibits no tenderness.  Lymphadenopathy:    She has no cervical adenopathy.  Neurological: She is alert and oriented to person, place, and time. No cranial nerve  deficit. She exhibits normal muscle tone. Coordination normal.  Skin: Skin is warm and dry. No rash noted. She is not diaphoretic. No erythema. No pallor.  Psychiatric: She has a  normal mood and affect. Her behavior is normal. Judgment and thought content normal.          Assessment & Plan:

## 2013-02-11 NOTE — Assessment & Plan Note (Addendum)
Appears to be in normal sinus rhythm today. Plan to continue metoprolol. Note that her dose was recently reduced by her cardiologist. No anticoagulation given h/o GI bleeding.

## 2013-02-11 NOTE — Progress Notes (Signed)
Pre-visit discussion using our clinic review tool. No additional management support is needed unless otherwise documented below in the visit note.  

## 2013-02-12 ENCOUNTER — Other Ambulatory Visit: Payer: Medicare Other

## 2013-02-12 DIAGNOSIS — I10 Essential (primary) hypertension: Secondary | ICD-10-CM

## 2013-02-15 ENCOUNTER — Other Ambulatory Visit: Payer: Medicare Other

## 2013-02-15 DIAGNOSIS — I10 Essential (primary) hypertension: Secondary | ICD-10-CM

## 2013-02-17 ENCOUNTER — Encounter: Payer: Self-pay | Admitting: Cardiovascular Disease

## 2013-02-17 ENCOUNTER — Ambulatory Visit: Payer: Medicare Other

## 2013-02-17 ENCOUNTER — Ambulatory Visit (INDEPENDENT_AMBULATORY_CARE_PROVIDER_SITE_OTHER): Payer: Medicare Other | Admitting: Cardiovascular Disease

## 2013-02-17 VITALS — BP 132/82 | HR 64 | Ht 63.0 in | Wt 148.8 lb

## 2013-02-17 DIAGNOSIS — I350 Nonrheumatic aortic (valve) stenosis: Secondary | ICD-10-CM

## 2013-02-17 DIAGNOSIS — I10 Essential (primary) hypertension: Secondary | ICD-10-CM

## 2013-02-17 DIAGNOSIS — R269 Unspecified abnormalities of gait and mobility: Secondary | ICD-10-CM

## 2013-02-17 DIAGNOSIS — K922 Gastrointestinal hemorrhage, unspecified: Secondary | ICD-10-CM

## 2013-02-17 DIAGNOSIS — R2681 Unsteadiness on feet: Secondary | ICD-10-CM

## 2013-02-17 DIAGNOSIS — I359 Nonrheumatic aortic valve disorder, unspecified: Secondary | ICD-10-CM | POA: Diagnosis not present

## 2013-02-17 DIAGNOSIS — I4891 Unspecified atrial fibrillation: Secondary | ICD-10-CM

## 2013-02-17 LAB — FERRITIN: Ferritin: 62.6 ng/mL (ref 10.0–291.0)

## 2013-02-17 NOTE — Assessment & Plan Note (Signed)
Blood pressure is well controlled on today's visit. No changes made to the medications. 

## 2013-02-17 NOTE — Assessment & Plan Note (Signed)
Maintaining normal sinus rhythm. No changes to her medications 

## 2013-02-17 NOTE — Patient Instructions (Signed)
You are doing well. No medication changes were made.  Please call us if you have new issues that need to be addressed before your next appt.  Your physician wants you to follow-up in: 6 months.  You will receive a reminder letter in the mail two months in advance. If you don't receive a letter, please call our office to schedule the follow-up appointment.   

## 2013-02-17 NOTE — Assessment & Plan Note (Signed)
Encouraged her to use her walker. She refuses a life alert

## 2013-02-17 NOTE — Progress Notes (Signed)
Patient ID: Dawn Cooper, female    DOB: [birth year], 79 y.o.   MRN: [id number]  HPI Comments: Dawn Cooper is a very pleasant 82 -year-old woman with moderate aortic valve stenosis,  who presented to the hospital on January 02 2011 with shortness of breath, found to be in atrial fibrillation with RVR, elevated BNP, CT scan showing bilateral moderate sized pleural effusions who was started on rate control and pradaxa. She refused cardioversion after 4 weeks on anticoagulation. At approximately 6 weeks out, she had a AVM bleed and presented to the hospital with melena and hematocrit of 18. She had been having melena for at least one week and did not go to the hospital. She had EGD with treatment of her AVM. All anticoagulation was held.   We had discussed the case with Dr. Marva Panda and it was felt that given her risk of further bleeding from additional AVMs and given her age and the extent of her recent bleed, that no further anticoagulation be used.    fall in November 2013. She broke her leg and had surgery with a rod placed on the right.   In followup today, she reports feeling well. No recent falls. She does work in her garden outside. She has a rare cough, more common at nighttime. Rare episodes of dizziness first thing in the morning. Better during the daytime. Today she has nasal congestion, low-grade cough. Otherwise no complaints.  Echocardiogram while she was in atrial fibrillation showed ejection fraction 35-45%, normal left ventricular systolic pressures, moderate aortic valve stenosis with mean gradient of 32 mmHg  Previous lab work:  total cholesterol 132, LDL 82, HDL 40, triglycerides 502, hemoglobin A1c 4.9  Previous CT Scan of the chest showing moderate bilateral pleural effusions with compressive atelectasis in both lower lobes, no PE  EKG shows normal sinus rhythm  with heart rate 64 beats per minute, intraventricular conduction delay, left axis/left anterior fascicular  block   Outpatient Encounter Prescriptions as of 02/17/2013  Medication Sig  . Acetaminophen (TYLENOL PO) Take by mouth. Takes 1-2 tablets prn.  . furosemide (LASIX) 20 MG tablet Take 1 tablet (20 mg total) by mouth every other day.  . hydrALAZINE (APRESOLINE) 25 MG tablet Take 1 tablet (25 mg total) by mouth 3 (three) times daily.  . isosorbide mononitrate (IMDUR) 30 MG 24 hr tablet Take 1 tablet (30 mg total) by mouth 2 (two) times daily.  Marland Kitchen lisinopril (PRINIVIL,ZESTRIL) 40 MG tablet Take 1 tablet (40 mg total) by mouth daily.  . metoprolol tartrate (LOPRESSOR) 25 MG tablet Take 1 tablet (25 mg total) by mouth 2 (two) times daily.  . pantoprazole (PROTONIX) 40 MG tablet Take 1 tablet (40 mg total) by mouth daily.  . potassium chloride (K-DUR,KLOR-CON) 10 MEQ tablet Take 1 tablet (10 mEq total) by mouth every other day.    Review of Systems  Constitutional: Positive for fatigue.  HENT: Negative.   Eyes: Negative.   Respiratory: Negative.   Cardiovascular: Negative.   Gastrointestinal: Negative.   Endocrine: Negative.   Musculoskeletal: Positive for gait problem.  Skin: Negative.   Allergic/Immunologic: Negative.   Neurological: Negative.   Hematological: Negative.   Psychiatric/Behavioral: Negative.   All other systems reviewed and are negative.     BP 132/82  Pulse 64  Ht 5\' 3"  (1.6 m)  Wt 148 lb 12 oz (67.473 kg)  BMI 26.36 kg/m2  Physical Exam  Nursing note and vitals reviewed. Constitutional: She is oriented to person, place, and time.  She appears well-developed and well-nourished.  HENT:  Head: Normocephalic.  Nose: Nose normal.  Mouth/Throat: Oropharynx is clear and moist.  Eyes: Conjunctivae are normal. Pupils are equal, round, and reactive to light.  Neck: Normal range of motion. Neck supple. No JVD present.  Cardiovascular: Regular rhythm, S1 normal, S2 normal and intact distal pulses.  Bradycardia present.  Exam reveals no gallop and no friction rub.    Murmur heard.  Crescendo systolic murmur is present with a grade of 2/6  Pulmonary/Chest: Effort normal and breath sounds normal. No respiratory distress. She has no wheezes. She has no rales. She exhibits no tenderness.  Abdominal: Soft. Bowel sounds are normal. She exhibits no distension. There is no tenderness.  Musculoskeletal: Normal range of motion. She exhibits no edema and no tenderness.  Trace edema to the low shins bilaterally  Lymphadenopathy:    She has no cervical adenopathy.  Neurological: She is alert and oriented to person, place, and time. Coordination normal.  Skin: Skin is warm and dry. No rash noted. No erythema.  Psychiatric: She has a normal mood and affect. Her behavior is normal. Judgment and thought content normal.    Assessment and Plan

## 2013-02-17 NOTE — Assessment & Plan Note (Signed)
Consider repeat echocardiogram in 2015. Relatively asymptomatic. No significant shortness of breath or chest tightness.

## 2013-02-26 ENCOUNTER — Other Ambulatory Visit: Payer: Medicare Other

## 2013-03-15 ENCOUNTER — Other Ambulatory Visit: Payer: Medicare Other

## 2013-03-15 ENCOUNTER — Other Ambulatory Visit: Payer: Self-pay | Admitting: *Deleted

## 2013-03-15 DIAGNOSIS — Z1211 Encounter for screening for malignant neoplasm of colon: Secondary | ICD-10-CM

## 2013-03-16 ENCOUNTER — Other Ambulatory Visit: Payer: Self-pay | Admitting: *Deleted

## 2013-03-16 DIAGNOSIS — Z1211 Encounter for screening for malignant neoplasm of colon: Secondary | ICD-10-CM

## 2013-03-17 ENCOUNTER — Other Ambulatory Visit (INDEPENDENT_AMBULATORY_CARE_PROVIDER_SITE_OTHER): Payer: Medicare Other

## 2013-03-17 DIAGNOSIS — Z1211 Encounter for screening for malignant neoplasm of colon: Secondary | ICD-10-CM | POA: Diagnosis not present

## 2013-03-19 ENCOUNTER — Telehealth: Payer: Self-pay | Admitting: *Deleted

## 2013-03-19 DIAGNOSIS — R195 Other fecal abnormalities: Secondary | ICD-10-CM

## 2013-03-19 LAB — FECAL OCCULT BLOOD, IMMUNOCHEMICAL: Fecal Occult Bld: POSITIVE — AB

## 2013-03-19 NOTE — Telephone Encounter (Signed)
OK. Does she have a Gi physician? Needs to be seen by GI for endoscopy given anemia and pos IFOB. We should set up follow up with her this week.

## 2013-03-19 NOTE — Telephone Encounter (Signed)
Clydie Braun from [facility] called to report patient had a positive Ifob.

## 2013-03-22 NOTE — Telephone Encounter (Signed)
Patient informed, she has no preference for GI physician.

## 2013-04-13 DIAGNOSIS — K644 Residual hemorrhoidal skin tags: Secondary | ICD-10-CM | POA: Diagnosis not present

## 2013-04-13 DIAGNOSIS — R195 Other fecal abnormalities: Secondary | ICD-10-CM | POA: Diagnosis not present

## 2013-05-03 ENCOUNTER — Encounter: Payer: Self-pay | Admitting: *Deleted

## 2013-05-11 ENCOUNTER — Encounter: Payer: Medicare Other | Admitting: Internal Medicine

## 2013-05-26 ENCOUNTER — Encounter: Payer: Self-pay | Admitting: *Deleted

## 2013-06-14 ENCOUNTER — Encounter: Payer: Self-pay | Admitting: Internal Medicine

## 2013-06-14 ENCOUNTER — Ambulatory Visit (INDEPENDENT_AMBULATORY_CARE_PROVIDER_SITE_OTHER): Payer: Medicare Other | Admitting: Internal Medicine

## 2013-06-14 VITALS — BP 160/84 | HR 64 | Temp 97.8°F | Ht 61.0 in | Wt 151.0 lb

## 2013-06-14 DIAGNOSIS — I10 Essential (primary) hypertension: Secondary | ICD-10-CM | POA: Diagnosis not present

## 2013-06-14 DIAGNOSIS — Z0000 Encounter for general adult medical examination without abnormal findings: Secondary | ICD-10-CM | POA: Insufficient documentation

## 2013-06-14 LAB — COMPREHENSIVE METABOLIC PANEL
ALBUMIN: 3.8 g/dL (ref 3.5–5.2)
ALT: 13 U/L (ref 0–35)
AST: 24 U/L (ref 0–37)
Alkaline Phosphatase: 60 U/L (ref 39–117)
BUN: 18 mg/dL (ref 6–23)
CALCIUM: 9.8 mg/dL (ref 8.4–10.5)
CO2: 25 meq/L (ref 19–32)
Chloride: 106 mEq/L (ref 96–112)
Creatinine, Ser: 1.2 mg/dL (ref 0.4–1.2)
GFR: 43.34 mL/min — AB (ref >60.00)
GLUCOSE: 96 mg/dL (ref 70–99)
POTASSIUM: 4.3 meq/L (ref 3.5–5.1)
Sodium: 139 mEq/L (ref 135–145)
Total Bilirubin: 0.9 mg/dL (ref 0.3–1.2)
Total Protein: 7.1 g/dL (ref 6.0–8.3)

## 2013-06-14 LAB — CBC WITH DIFFERENTIAL/PLATELET
BASOS PCT: 0.2 % (ref 0.0–3.0)
Basophils Absolute: 0 10*3/uL (ref 0.0–0.1)
EOS PCT: 3.7 % (ref 0.0–5.0)
Eosinophils Absolute: 0.2 10*3/uL (ref 0.0–0.7)
HEMATOCRIT: 33.6 % — AB (ref 36.0–46.0)
Hemoglobin: 11.3 g/dL — ABNORMAL LOW (ref 12.0–15.0)
Lymphocytes Relative: 31.1 % (ref 12.0–46.0)
Lymphs Abs: 2 10*3/uL (ref 0.7–4.0)
MCHC: 33.5 g/dL (ref 30.0–36.0)
MCV: 92.6 fl (ref 78.0–100.0)
MONOS PCT: 10.2 % (ref 3.0–12.0)
Monocytes Absolute: 0.7 10*3/uL (ref 0.1–1.0)
NEUTROS PCT: 54.8 % (ref 43.0–77.0)
Neutro Abs: 3.5 10*3/uL (ref 1.4–7.7)
PLATELETS: 207 10*3/uL (ref 150.0–400.0)
RBC: 3.63 Mil/uL — AB (ref 3.87–5.11)
RDW: 14.1 % (ref 11.5–14.6)
WBC: 6.4 10*3/uL (ref 4.5–10.5)

## 2013-06-14 LAB — HM MAMMOGRAPHY

## 2013-06-14 LAB — HM COLONOSCOPY

## 2013-06-14 LAB — HM PAP SMEAR

## 2013-06-14 MED ORDER — HYDRALAZINE HCL 25 MG PO TABS: 25.0000 mg | ORAL_TABLET | Freq: Two times a day (BID) | ORAL | Status: DC

## 2013-06-14 NOTE — Progress Notes (Signed)
The patient is here for annual Medicare Wellness Examination and management of other chronic and acute problems.   The risk factors are reflected in the history.  The roster of all physicians providing medical care to patient - is listed in the Snapshot section of the chart.  Activities of daily living:   The patient is 100% independent in all ADLs: dressing, toileting, feeding as well as independent mobility. Patient lives with daughter. No indoor pets. One story house. Carpeted floors and Hard wood floors.  Home safety :  The patient has smoke detectors in the home.  They wear seatbelts in their car. There are no firearms at home.  There is no violence in the home. They feel safe where they live.  Infectious Risks: There is no risks for hepatitis, STDs or HIV.  There is no  history of blood transfusion.  They have no travel history to infectious disease endemic areas of the world.  Additional Health Care Providers: The patient has not seen their dentist in the last six months. Wears dentures. They have seen their eye doctor in the last year. Opthalmologist - Dr. Ellin Mayhew They deny hearing issues. They have deferred audiologic testing in the last year.   They do not  have excessive sun exposure. Discussed the need for sun protection: hats,long sleeves and use of sunscreen if there is significant sun exposure.  Dermatologist - Dr. Koleen Nimrod.  Diet: the importance of a healthy diet is discussed. They do have a healthy diet.  The benefits of regular aerobic exercise were discussed. Patient exercises by doing house and yard work.  Depression screen: there are no signs or vegative symptoms of depression- irritability, change in appetite, anhedonia, sadness/tearfullness.  Cognitive assessment: the patient manages all their financial and personal affairs and is actively engaged.   [place], daughter Living Will - none  The following portions of the patient's history  were reviewed and updated as appropriate: allergies, current medications, past family history, past medical history,  past surgical history, past social history and problem list.  Visual acuity was not assessed per patient preference as they have regular follow up with their ophthalmologist. Hearing and body mass index were assessed and reviewed.   During the course of the visit the patient was educated and counseled about appropriate screening and preventive services including : fall prevention , diabetes screening, nutrition counseling, colorectal cancer screening, and recommended immunizations.    Review of Systems  Constitutional: Negative for fever, chills, appetite change, fatigue and unexpected weight change.  HENT: Negative for congestion, ear pain, sinus pressure, sore throat, trouble swallowing and voice change.   Eyes: Negative for visual disturbance.  Respiratory: Negative for cough, shortness of breath, wheezing and stridor.   Cardiovascular: Negative for chest pain, palpitations and leg swelling.  Gastrointestinal: Negative for nausea, vomiting, abdominal pain, diarrhea, constipation, blood in stool, abdominal distention and anal bleeding.  Genitourinary: Negative for dysuria and flank pain.  Musculoskeletal: Negative for arthralgias, gait problem, myalgias and neck pain.  Skin: Negative for color change and rash.  Neurological: Negative for dizziness and headaches.  Hematological: Negative for adenopathy. Does not bruise/bleed easily.  Psychiatric/Behavioral: Negative for suicidal ideas, sleep disturbance and dysphoric mood. The patient is not nervous/anxious.        Objective:    BP 160/84  Pulse 64  Temp(Src) 97.8 F (36.6 C) (Oral)  Ht 5\' 1"  (1.549 m)  Wt 151 lb (68.493 kg)  BMI 28.55 kg/m2  SpO2 96% Physical  Exam  Constitutional: She is oriented to person, place, and time. She appears well-developed and well-nourished. No distress.  HENT:  Head: Normocephalic and  atraumatic.  Right Ear: External ear normal.  Left Ear: External ear normal.  Nose: Nose normal.  Mouth/Throat: Oropharynx is clear and moist. No oropharyngeal exudate.  Eyes: Conjunctivae are normal. Pupils are equal, round, and reactive to light. Right eye exhibits no discharge. Left eye exhibits no discharge. No scleral icterus.  Neck: Normal range of motion. Neck supple. No tracheal deviation present. No thyromegaly present.  Cardiovascular: Normal rate, regular rhythm, normal heart sounds and intact distal pulses.  Exam reveals no gallop and no friction rub.   No murmur heard. Pulmonary/Chest: Effort normal and breath sounds normal. No accessory muscle usage. Not tachypneic. No respiratory distress. She has no decreased breath sounds. She has no wheezes. She has no rhonchi. She has no rales. She exhibits no tenderness.  Abdominal: Soft. Bowel sounds are normal. She exhibits no distension and no mass. There is no tenderness. There is no rebound and no guarding.  Musculoskeletal: Normal range of motion. She exhibits no edema and no tenderness.  Lymphadenopathy:    She has no cervical adenopathy.  Neurological: She is alert and oriented to person, place, and time. No cranial nerve deficit. She exhibits normal muscle tone. Coordination normal.  Skin: Skin is warm and dry. No rash noted. She is not diaphoretic. No erythema. No pallor.  Psychiatric: She has a normal mood and affect. Her behavior is normal. Judgment and thought content normal.          Assessment & Plan:   Problem List Items Addressed This Visit   HTN (hypertension)   Relevant Medications      hydrALAZINE (APRESOLINE) tablet   Medicare annual wellness visit, subsequent - Primary     General medical exam normal today. Patient declines breast exam, Pap or pelvic, mammogram, colonoscopy. She declines Prevnar today. Will check labs including CBC, CMP. Encouraged continued healthy diet and regular physical activity. Discussed  falls prevention. Appropriate screening performed.    Relevant Orders      CBC with Differential      Comprehensive metabolic panel      Microalbumin / creatinine urine ratio       Return in about 6 months (around 12/15/2013).

## 2013-06-14 NOTE — Assessment & Plan Note (Signed)
BP Readings from Last 3 Encounters:  06/14/13 160/84  02/17/13 132/82  02/11/13 160/76   Blood pressure generally well controlled for her age on current medications. Will continue. Will check renal function with labs.

## 2013-06-14 NOTE — Assessment & Plan Note (Signed)
General medical exam normal today. Patient declines breast exam, Pap or pelvic, mammogram, colonoscopy. She declines Prevnar today. Will check labs including CBC, CMP. Encouraged continued healthy diet and regular physical activity. Discussed falls prevention. Appropriate screening performed.

## 2013-06-15 ENCOUNTER — Telehealth: Payer: Self-pay | Admitting: Internal Medicine

## 2013-06-15 LAB — MICROALBUMIN / CREATININE URINE RATIO
Creatinine,U: 46.1 mg/dL
Microalb Creat Ratio: 0.2 mg/g (ref 0.0–30.0)
Microalb, Ur: 0.1 mg/dL (ref 0.0–1.9)

## 2013-06-15 NOTE — Telephone Encounter (Signed)
Relevant patient education assigned to patient using Emmi. ° °

## 2013-06-16 DIAGNOSIS — H353190 Nonexudative age-related macular degeneration, unspecified eye, stage unspecified: Secondary | ICD-10-CM | POA: Diagnosis not present

## 2013-08-18 ENCOUNTER — Encounter: Payer: Self-pay | Admitting: Cardiovascular Disease

## 2013-08-18 ENCOUNTER — Ambulatory Visit (INDEPENDENT_AMBULATORY_CARE_PROVIDER_SITE_OTHER): Payer: Medicare Other | Admitting: Cardiovascular Disease

## 2013-08-18 VITALS — BP 150/90 | HR 70 | Ht 63.0 in | Wt 152.8 lb

## 2013-08-18 DIAGNOSIS — R269 Unspecified abnormalities of gait and mobility: Secondary | ICD-10-CM

## 2013-08-18 DIAGNOSIS — R609 Edema, unspecified: Secondary | ICD-10-CM

## 2013-08-18 DIAGNOSIS — I10 Essential (primary) hypertension: Secondary | ICD-10-CM | POA: Diagnosis not present

## 2013-08-18 DIAGNOSIS — I350 Nonrheumatic aortic (valve) stenosis: Secondary | ICD-10-CM

## 2013-08-18 DIAGNOSIS — I4891 Unspecified atrial fibrillation: Secondary | ICD-10-CM | POA: Diagnosis not present

## 2013-08-18 DIAGNOSIS — R2681 Unsteadiness on feet: Secondary | ICD-10-CM

## 2013-08-18 DIAGNOSIS — R011 Cardiac murmur, unspecified: Secondary | ICD-10-CM | POA: Diagnosis not present

## 2013-08-18 DIAGNOSIS — I359 Nonrheumatic aortic valve disorder, unspecified: Secondary | ICD-10-CM

## 2013-08-18 NOTE — Assessment & Plan Note (Signed)
Recommended a regular walking program, balance class

## 2013-08-18 NOTE — Assessment & Plan Note (Signed)
Edema is not pitting. More consistent with venous insufficiency. Recommended she continue Lasix every other day. Extra Lasix only as needed for severe swelling

## 2013-08-18 NOTE — Assessment & Plan Note (Signed)
She is maintaining normal sinus rhythm. No changes to her medications. Not a candidate for anticoagulation

## 2013-08-18 NOTE — Patient Instructions (Addendum)
You are doing well. No medication changes were made.  We will schedule a echocardiogram for aortic valve stenosis  Please call us if you have new issues that need to be addressed before your next appt.  Your physician wants you to follow-up in: 6 months.  You will receive a reminder letter in the mail two months in advance. If you don't receive a letter, please call our office to schedule the follow-up appointment.

## 2013-08-18 NOTE — Assessment & Plan Note (Signed)
Blood pressure is well controlled at home. Blood pressure is elevated today. No changes made to the medications. Encouraged her to continue to monitor her blood pressure at home

## 2013-08-18 NOTE — Assessment & Plan Note (Signed)
We have recommended an echocardiogram to evaluate her aortic valve. Valve was moderate stenosis 3 years ago. Some shortness of breath with exertion but she is relatively sedentary

## 2013-08-18 NOTE — Progress Notes (Signed)
Patient ID: Dawn Cooper, female    DOB: 12/08/1918, 80 y.o.   MRN: [id number]  HPI Comments: Dawn Cooper is a very pleasant 80 -year-old woman with moderate aortic valve stenosis in 2012,  who presented to the hospital on January 02 2011 with  atrial fibrillation with RVR, elevated BNP, CT scan showing bilateral moderate sized pleural effusions who was started on rate control and pradaxa. She refused cardioversion after 4 weeks on anticoagulation. At approximately 6 weeks out, she had a AVM bleed and presented to the hospital with melena and hematocrit of 18. She had been having melena for at least one week and did not go to the hospital. She had EGD with treatment of her AVM. All anticoagulation was held.   We had discussed the case with Dr. Gustavo Lah and it was felt that given her risk of further bleeding from additional AVMs and given her age and the extent of her recent bleed, that no further anticoagulation be used.   In followup today, she reports that she is doing well in general. Blood pressure at home is typically 110 up to 140s on regular basis. Trace swelling in her legs. She is taking Lasix every other day. No recent falls. She continues to do gardening outside. No significant dizziness.   fall in November 2013. She broke her leg and had surgery with a rod placed on the right.   Echocardiogram while she was in atrial fibrillation showed ejection fraction 35-45%, normal left ventricular systolic pressures, moderate aortic valve stenosis with mean gradient of 32 mmHg  Previous lab work:  total cholesterol 132, LDL 82, HDL 40, triglycerides 502, hemoglobin A1c 4.9  Previous CT Scan of the chest showing moderate bilateral pleural effusions with compressive atelectasis in both lower lobes, no PE  EKG shows normal sinus rhythm  with heart rate 70 beats per minute, intraventricular conduction delay, left axis/left anterior fascicular block   Outpatient Encounter Prescriptions as of  08/18/2013  Medication Sig  . Acetaminophen (TYLENOL PO) Take by mouth. Takes 1-2 tablets prn.  . furosemide (LASIX) 20 MG tablet Take 1 tablet (20 mg total) by mouth every other day.  . hydrALAZINE (APRESOLINE) 25 MG tablet Take 1 tablet (25 mg total) by mouth 2 (two) times daily.  . isosorbide mononitrate (IMDUR) 30 MG 24 hr tablet Take 30 mg by mouth daily.  Marland Kitchen lisinopril (PRINIVIL,ZESTRIL) 40 MG tablet Take 1 tablet (40 mg total) by mouth daily.  . metoprolol tartrate (LOPRESSOR) 25 MG tablet Take 1 tablet (25 mg total) by mouth 2 (two) times daily.  . pantoprazole (PROTONIX) 40 MG tablet Take 1 tablet (40 mg total) by mouth daily.  . potassium chloride (K-DUR,KLOR-CON) 10 MEQ tablet Take 1 tablet (10 mEq total) by mouth every other day.    Review of Systems  Constitutional: Positive for fatigue.  HENT: Negative.   Eyes: Negative.   Respiratory: Negative.   Cardiovascular: Negative.   Gastrointestinal: Negative.   Endocrine: Negative.   Musculoskeletal: Positive for gait problem.  Skin: Negative.   Allergic/Immunologic: Negative.   Neurological: Negative.   Hematological: Negative.   Psychiatric/Behavioral: Negative.   All other systems reviewed and are negative.    BP 150/90  Pulse 70  Ht 5\' 3"  (1.6 m)  Wt 152 lb 12 oz (69.287 kg)  BMI 27.07 kg/m2  Physical Exam  Nursing note and vitals reviewed. Constitutional: She is oriented to person, place, and time. She appears well-developed and well-nourished.  HENT:  Head: Normocephalic.  Nose: Nose normal.  Mouth/Throat: Oropharynx is clear and moist.  Eyes: Conjunctivae are normal. Pupils are equal, round, and reactive to light.  Neck: Normal range of motion. Neck supple. No JVD present.  Cardiovascular: Regular rhythm, S1 normal, S2 normal and intact distal pulses.  Bradycardia present.  Exam reveals no gallop and no friction rub.   Murmur heard.  Crescendo systolic murmur is present with a grade of 2/6  Pulmonary/Chest:  Effort normal and breath sounds normal. No respiratory distress. She has no wheezes. She has no rales. She exhibits no tenderness.  Abdominal: Soft. Bowel sounds are normal. She exhibits no distension. There is no tenderness.  Musculoskeletal: Normal range of motion. She exhibits no edema and no tenderness.  Trace edema to the low shins bilaterally  Lymphadenopathy:    She has no cervical adenopathy.  Neurological: She is alert and oriented to person, place, and time. Coordination normal.  Skin: Skin is warm and dry. No rash noted. No erythema.  Psychiatric: She has a normal mood and affect. Her behavior is normal. Judgment and thought content normal.    Assessment and Plan

## 2013-08-30 ENCOUNTER — Other Ambulatory Visit (INDEPENDENT_AMBULATORY_CARE_PROVIDER_SITE_OTHER): Payer: Medicare Other

## 2013-08-30 ENCOUNTER — Other Ambulatory Visit: Payer: Self-pay

## 2013-08-30 DIAGNOSIS — R011 Cardiac murmur, unspecified: Secondary | ICD-10-CM

## 2013-08-30 DIAGNOSIS — I4891 Unspecified atrial fibrillation: Secondary | ICD-10-CM | POA: Diagnosis not present

## 2013-08-30 DIAGNOSIS — I359 Nonrheumatic aortic valve disorder, unspecified: Secondary | ICD-10-CM

## 2013-08-30 DIAGNOSIS — I059 Rheumatic mitral valve disease, unspecified: Secondary | ICD-10-CM

## 2013-08-30 DIAGNOSIS — I350 Nonrheumatic aortic (valve) stenosis: Secondary | ICD-10-CM

## 2013-10-06 ENCOUNTER — Ambulatory Visit (INDEPENDENT_AMBULATORY_CARE_PROVIDER_SITE_OTHER): Payer: Medicare Other | Admitting: Cardiovascular Disease

## 2013-10-06 ENCOUNTER — Encounter: Payer: Self-pay | Admitting: Cardiovascular Disease

## 2013-10-06 VITALS — BP 160/90 | HR 58 | Ht 63.0 in | Wt 150.2 lb

## 2013-10-06 DIAGNOSIS — I10 Essential (primary) hypertension: Secondary | ICD-10-CM

## 2013-10-06 DIAGNOSIS — I350 Nonrheumatic aortic (valve) stenosis: Secondary | ICD-10-CM

## 2013-10-06 DIAGNOSIS — I4891 Unspecified atrial fibrillation: Secondary | ICD-10-CM

## 2013-10-06 DIAGNOSIS — I359 Nonrheumatic aortic valve disorder, unspecified: Secondary | ICD-10-CM | POA: Diagnosis not present

## 2013-10-06 DIAGNOSIS — R609 Edema, unspecified: Secondary | ICD-10-CM | POA: Diagnosis not present

## 2013-10-06 MED ORDER — HYDRALAZINE HCL 50 MG PO TABS: 50.0000 mg | ORAL_TABLET | Freq: Three times a day (TID) | ORAL | Status: DC

## 2013-10-06 NOTE — Assessment & Plan Note (Signed)
Trace pitting edema bilaterally. We have suggested if this gets worse that she take extra Lasix

## 2013-10-06 NOTE — Assessment & Plan Note (Signed)
I'm concerned about her hypertension. We have suggested she increase her hydralazine up to 50 mg twice a day, even 3 times a day for systolic pressures more than 145. Continue to check her blood pressure twice daily and write on a piece of paper for review. She'll confirm that she is taking isosorbide daily

## 2013-10-06 NOTE — Assessment & Plan Note (Signed)
She does not appear to have significant symptoms from her aortic valve stenosis. No significant shortness of breath. Leg edema and fluid overload could be exacerbated by her aortic valve stenosis. Periodic echocardiogram likely every 2 years

## 2013-10-06 NOTE — Assessment & Plan Note (Signed)
Maintaining normal sinus rhythm. No symptoms concerning for atrial fibrillation

## 2013-10-06 NOTE — Progress Notes (Signed)
Patient ID: Dawn Cooper, female    DOB: [birth year], 80 y.o.   MRN: [id number]  HPI Comments: Dawn Cooper is a very pleasant 77 -year-old woman with moderate aortic valve stenosis in 2012,  who presented to the hospital on January 02 2011 with  atrial fibrillation with RVR, elevated BNP, CT scan showing bilateral moderate sized pleural effusions who was started on rate control and pradaxa. She refused cardioversion after 4 weeks on anticoagulation. At approximately 6 weeks out, she had a AVM bleed and presented to the hospital with melena and hematocrit of 18. She had been having melena for at least one week and did not go to the hospital. She had EGD with treatment of her AVM. All anticoagulation was held.   We had discussed the case with Dr. Gustavo Lah and it was felt that given her risk of further bleeding from additional AVMs and given her age and the extent of her recent bleed, that no further anticoagulation be used.   In followup today, she reports that she is doing well in general. She denies any GI bleeding. She has been monitoring her blood pressure at home and reports it has been running mildly elevated. She does report his labile at times, sometimes running very low, sometimes very high. She has trace leg edema. Weight is down 2 pounds from her prior clinic visit. She takes Lasix every other day. She is uncertain if she is taking isosorbide  No recent falls. She continues to do gardening outside. No significant dizziness.   fall in November 2013. She broke her leg and had surgery with a rod placed on the right.   Echocardiogram while she was in atrial fibrillation showed ejection fraction 35-45%, normal left ventricular systolic pressures, moderate aortic valve stenosis with mean gradient of 32 mmHg  Previous lab work:  total cholesterol 132, LDL 82, HDL 40, triglycerides 502, hemoglobin A1c 4.9  Previous CT Scan of the chest showing moderate bilateral pleural effusions with compressive  atelectasis in both lower lobes, no PE  EKG shows normal sinus rhythm  with heart rate 58 beats per minute, intraventricular conduction delay, left axis/left anterior fascicular block   Outpatient Encounter Prescriptions as of 10/06/2013  Medication Sig  . Acetaminophen (TYLENOL PO) Take by mouth. Takes 1-2 tablets prn.  . furosemide (LASIX) 20 MG tablet Take 1 tablet (20 mg total) by mouth every other day.  . hydrALAZINE (APRESOLINE) 25 MG tablet Take 1 tablet (25 mg total) by mouth 2 (two) times daily.  . isosorbide mononitrate (IMDUR) 30 MG 24 hr tablet Take 30 mg by mouth daily.  Marland Kitchen lisinopril (PRINIVIL,ZESTRIL) 40 MG tablet Take 1 tablet (40 mg total) by mouth daily.  . metoprolol tartrate (LOPRESSOR) 25 MG tablet Take 1 tablet (25 mg total) by mouth 2 (two) times daily.  . pantoprazole (PROTONIX) 40 MG tablet Take 1 tablet (40 mg total) by mouth daily.  . potassium chloride (K-DUR,KLOR-CON) 10 MEQ tablet Take 1 tablet (10 mEq total) by mouth every other day.    Review of Systems  Constitutional: Positive for fatigue.  HENT: Negative.   Eyes: Negative.   Respiratory: Negative.   Cardiovascular: Positive for leg swelling.  Gastrointestinal: Negative.   Endocrine: Negative.   Musculoskeletal: Positive for gait problem.  Skin: Negative.   Allergic/Immunologic: Negative.   Neurological: Negative.   Hematological: Negative.   Psychiatric/Behavioral: Negative.   All other systems reviewed and are negative.    BP 160/90  Pulse 58  Ht 5\' 3"  (  1.6 m)  Wt 150 lb 4 oz (68.153 kg)  BMI 26.62 kg/m2 Repeat blood pressure was systolic 280 Physical Exam  Nursing note and vitals reviewed. Constitutional: She is oriented to person, place, and time. She appears well-developed and well-nourished.  HENT:  Head: Normocephalic.  Nose: Nose normal.  Mouth/Throat: Oropharynx is clear and moist.  Eyes: Conjunctivae are normal. Pupils are equal, round, and reactive to light.  Neck: Normal  range of motion. Neck supple. No JVD present.  Cardiovascular: Regular rhythm, S1 normal, S2 normal and intact distal pulses.  Bradycardia present.  Exam reveals no gallop and no friction rub.   Murmur heard.  Crescendo systolic murmur is present with a grade of 2/6  Pulmonary/Chest: Effort normal and breath sounds normal. No respiratory distress. She has no wheezes. She has no rales. She exhibits no tenderness.  Abdominal: Soft. Bowel sounds are normal. She exhibits no distension. There is no tenderness.  Musculoskeletal: Normal range of motion. She exhibits no edema and no tenderness.  Trace edema to the low shins bilaterally  Lymphadenopathy:    She has no cervical adenopathy.  Neurological: She is alert and oriented to person, place, and time. Coordination normal.  Skin: Skin is warm and dry. No rash noted. No erythema.  Psychiatric: She has a normal mood and affect. Her behavior is normal. Judgment and thought content normal.    Assessment and Plan

## 2013-10-06 NOTE — Patient Instructions (Addendum)
Blood pressure is elevated today Please check to see that you are taking isosorbide at least once a day  Please increase the hydralazine up to 50 mg twice a day  Please closely monitor your blood pressure If it runs more than 145 to 150, Please take extra hydralazine  Please call us if you have new issues that need to be addressed before your next appt.  Your physician wants you to follow-up in: 1 month.

## 2013-11-05 ENCOUNTER — Encounter: Payer: Self-pay | Admitting: Cardiovascular Disease

## 2013-11-05 ENCOUNTER — Ambulatory Visit (INDEPENDENT_AMBULATORY_CARE_PROVIDER_SITE_OTHER): Payer: Medicare Other | Admitting: Cardiovascular Disease

## 2013-11-05 VITALS — BP 158/70 | HR 68 | Ht 60.0 in | Wt 150.5 lb

## 2013-11-05 DIAGNOSIS — I10 Essential (primary) hypertension: Secondary | ICD-10-CM

## 2013-11-05 DIAGNOSIS — I350 Nonrheumatic aortic (valve) stenosis: Secondary | ICD-10-CM

## 2013-11-05 DIAGNOSIS — R269 Unspecified abnormalities of gait and mobility: Secondary | ICD-10-CM

## 2013-11-05 DIAGNOSIS — R609 Edema, unspecified: Secondary | ICD-10-CM

## 2013-11-05 DIAGNOSIS — I359 Nonrheumatic aortic valve disorder, unspecified: Secondary | ICD-10-CM

## 2013-11-05 DIAGNOSIS — R2681 Unsteadiness on feet: Secondary | ICD-10-CM

## 2013-11-05 DIAGNOSIS — I4891 Unspecified atrial fibrillation: Secondary | ICD-10-CM | POA: Diagnosis not present

## 2013-11-05 MED ORDER — ISOSORBIDE MONONITRATE ER 30 MG PO TB24: 30.0000 mg | ORAL_TABLET | Freq: Every day | ORAL | Status: DC

## 2013-11-05 NOTE — Progress Notes (Signed)
Patient ID: Dawn Cooper, female    DOB: April 17, 1918, 80 y.o.   MRN: [id number]  HPI Comments: Dawn Cooper is a very pleasant 55 -year-old woman with moderate aortic valve stenosis in 2012,  who presented to the hospital on January 02 2011 with  atrial fibrillation with RVR, elevated BNP, CT scan showing bilateral moderate sized pleural effusions who was started on rate control and pradaxa. She refused cardioversion after 4 weeks on anticoagulation. At approximately 6 weeks out, she had a AVM bleed and presented to the hospital with melena and hematocrit of 18. She had been having melena for at least one week and did not go to the hospital. She had EGD with treatment of her AVM. All anticoagulation was held.   We had discussed the case with Dr. Gustavo Lah and it was felt that given her risk of further bleeding from additional AVMs and given her age and the extent of her recent bleed, that no further anticoagulation be used.   In followup today, she reports that she is doing well in general.  She feels well with no complaints. She is relatively active at does not do regular exercise program.. She reports blood pressure has been running in the 100 teens up to 676H systolic on a regular basis. Numbers were provided to [place] today She denies any GI bleeding. She takes Lasix every other day.   No recent falls. She continues to do gardening outside. No significant dizziness. She's not taking hydralazine   fall in November 2013. She broke her leg and had surgery with a rod placed on the right.   Echocardiogram while she was in atrial fibrillation showed ejection fraction 35-45%, normal left ventricular systolic pressures, moderate aortic valve stenosis with mean gradient of 32 mmHg  Previous lab work:  total cholesterol 132, LDL 82, HDL 40, triglycerides 502, hemoglobin A1c 4.9  Previous CT Scan of the chest showing moderate bilateral pleural effusions with compressive atelectasis in both lower lobes, no  PE  EKG shows normal sinus rhythm  with heart rate 68 beats per minute, intraventricular conduction delay, left bundle branch block   Outpatient Encounter Prescriptions as of 11/05/2013  Medication Sig  . Acetaminophen (TYLENOL PO) Take by mouth. Takes 1-2 tablets prn.  . furosemide (LASIX) 20 MG tablet Take 1 tablet (20 mg total) by mouth every other day.  . hydrALAZINE (APRESOLINE) 50 MG tablet Take 1 tablet (50 mg total) by mouth 3 (three) times daily. Reports that she's not taking hydralazine   . isosorbide mononitrate (IMDUR) 30 MG 24 hr tablet Take 30 mg by mouth daily.  Marland Kitchen lisinopril (PRINIVIL,ZESTRIL) 40 MG tablet Take 1 tablet (40 mg total) by mouth daily.  . metoprolol tartrate (LOPRESSOR) 25 MG tablet Take 1 tablet (25 mg total) by mouth 2 (two) times daily.  . pantoprazole (PROTONIX) 40 MG tablet Take 1 tablet (40 mg total) by mouth daily.  . potassium chloride (K-DUR,KLOR-CON) 10 MEQ tablet Take 1 tablet (10 mEq total) by mouth every other day.    Review of Systems  Constitutional: Negative.   HENT: Negative.   Eyes: Negative.   Respiratory: Negative.   Cardiovascular: Negative.   Gastrointestinal: Negative.   Endocrine: Negative.   Musculoskeletal: Positive for gait problem.  Skin: Negative.   Allergic/Immunologic: Negative.   Neurological: Negative.   Hematological: Negative.   Psychiatric/Behavioral: Negative.   All other systems reviewed and are negative.    BP 158/70  Pulse 68  Ht 5' (1.524 m)  [employer] [facility]  150 lb 8 oz (68.266 kg)  BMI 29.39 kg/m2 Repeat blood pressure 415/83 systolic Physical Exam  Nursing note and vitals reviewed. Constitutional: She is oriented to person, place, and time. She appears well-developed and well-nourished.  HENT:  Head: Normocephalic.  Nose: Nose normal.  Mouth/Throat: Oropharynx is clear and moist.  Eyes: Conjunctivae are normal. Pupils are equal, round, and reactive to light.  Neck: Normal range of motion. Neck supple. No JVD  present.  Cardiovascular: Regular rhythm, S1 normal, S2 normal and intact distal pulses.  Bradycardia present.  Exam reveals no gallop and no friction rub.   Murmur heard.  Crescendo systolic murmur is present with a grade of 2/6  Pulmonary/Chest: Effort normal and breath sounds normal. No respiratory distress. She has no wheezes. She has no rales. She exhibits no tenderness.  Abdominal: Soft. Bowel sounds are normal. She exhibits no distension. There is no tenderness.  Musculoskeletal: Normal range of motion. She exhibits no edema and no tenderness.  Trace edema to the low shins bilaterally  Lymphadenopathy:    She has no cervical adenopathy.  Neurological: She is alert and oriented to person, place, and time. Coordination normal.  Skin: Skin is warm and dry. No rash noted. No erythema.  Psychiatric: She has a normal mood and affect. Her behavior is normal. Judgment and thought content normal.    Assessment and Plan

## 2013-11-05 NOTE — Assessment & Plan Note (Signed)
Long discussion today about aortic valve stenosis. Recent echocardiogram showing severe stenosis. This was discussed with her in detail. Mean and peak pressure gradients are not severe and as she is having no symptoms, we will see her back every 6 months. We have suggested she call us for any worsening shortness of breath or chest discomfort, even dizziness. We did spend some time talking about various treatment options for her valve. She would be a relatively good surgical candidate, possibly even TAVR candidate.

## 2013-11-05 NOTE — Patient Instructions (Signed)
You are doing well. No medication changes were made.  Please call us if you have new issues that need to be addressed before your next appt.  Your physician wants you to follow-up in: 6 months.  You will receive a reminder letter in the mail two months in advance. If you don't receive a letter, please call our office to schedule the follow-up appointment.   

## 2013-11-05 NOTE — Assessment & Plan Note (Signed)
Blood pressure numbers provided to [place] today are within a reasonable range. No medication changes made. She's not taking hydralazine

## 2013-11-05 NOTE — Assessment & Plan Note (Signed)
Recommended a regular walking program 

## 2013-11-05 NOTE — Assessment & Plan Note (Signed)
Nonpitting edema, likely from venous insufficiency. Recommended she stay on Lasix every other day

## 2013-11-05 NOTE — Assessment & Plan Note (Signed)
Appears to be maintaining normal sinus rhythm. Asymptomatic at this time with no symptoms concerning for arrhythmia

## 2013-12-17 ENCOUNTER — Ambulatory Visit: Payer: Medicare Other

## 2013-12-17 ENCOUNTER — Encounter: Payer: Self-pay | Admitting: Internal Medicine

## 2013-12-17 ENCOUNTER — Ambulatory Visit (INDEPENDENT_AMBULATORY_CARE_PROVIDER_SITE_OTHER): Payer: Medicare Other | Admitting: Internal Medicine

## 2013-12-17 VITALS — BP 160/80 | HR 65 | Temp 98.0°F | Ht 60.0 in | Wt 151.5 lb

## 2013-12-17 DIAGNOSIS — Z634 Disappearance and death of family member: Secondary | ICD-10-CM

## 2013-12-17 DIAGNOSIS — I4891 Unspecified atrial fibrillation: Secondary | ICD-10-CM | POA: Diagnosis not present

## 2013-12-17 DIAGNOSIS — I10 Essential (primary) hypertension: Secondary | ICD-10-CM

## 2013-12-17 DIAGNOSIS — R609 Edema, unspecified: Secondary | ICD-10-CM

## 2013-12-17 DIAGNOSIS — Z23 Encounter for immunization: Secondary | ICD-10-CM

## 2013-12-17 DIAGNOSIS — T23201A Burn of second degree of right hand, unspecified site, initial encounter: Secondary | ICD-10-CM

## 2013-12-17 DIAGNOSIS — D510 Vitamin B12 deficiency anemia due to intrinsic factor deficiency: Secondary | ICD-10-CM

## 2013-12-17 DIAGNOSIS — D649 Anemia, unspecified: Secondary | ICD-10-CM

## 2013-12-17 DIAGNOSIS — T23209A Burn of second degree of unspecified hand, unspecified site, initial encounter: Secondary | ICD-10-CM

## 2013-12-17 DIAGNOSIS — T23001A Burn of unspecified degree of right hand, unspecified site, initial encounter: Secondary | ICD-10-CM | POA: Insufficient documentation

## 2013-12-17 LAB — COMPREHENSIVE METABOLIC PANEL
ALT: 8 U/L (ref 0–35)
AST: 25 U/L (ref 0–37)
Albumin: 3.9 g/dL (ref 3.5–5.2)
Alkaline Phosphatase: 62 U/L (ref 39–117)
BILIRUBIN TOTAL: 0.4 mg/dL (ref 0.2–1.2)
BUN: 22 mg/dL (ref 6–23)
CHLORIDE: 106 meq/L (ref 96–112)
CO2: 23 mEq/L (ref 19–32)
Calcium: 9.8 mg/dL (ref 8.4–10.5)
Creatinine, Ser: 1.4 mg/dL — ABNORMAL HIGH (ref 0.4–1.2)
GFR: 36.68 mL/min — ABNORMAL LOW (ref >60.00)
Glucose, Bld: 85 mg/dL (ref 70–99)
Potassium: 4.7 mEq/L (ref 3.5–5.1)
Sodium: 135 mEq/L (ref 135–145)
Total Protein: 7.1 g/dL (ref 6.0–8.3)

## 2013-12-17 LAB — CBC WITH DIFFERENTIAL/PLATELET
BASOS ABS: 0 10*3/uL (ref 0.0–0.1)
Basophils Relative: 0.5 % (ref 0.0–3.0)
Eosinophils Absolute: 0.2 10*3/uL (ref 0.0–0.7)
Eosinophils Relative: 2.6 % (ref 0.0–5.0)
HCT: 32.5 % — ABNORMAL LOW (ref 36.0–46.0)
Hemoglobin: 10.9 g/dL — ABNORMAL LOW (ref 12.0–15.0)
LYMPHS ABS: 2 10*3/uL (ref 0.7–4.0)
LYMPHS PCT: 31.9 % (ref 12.0–46.0)
MCHC: 33.6 g/dL (ref 30.0–36.0)
MCV: 94.4 fl (ref 78.0–100.0)
MONOS PCT: 11.1 % (ref 3.0–12.0)
Monocytes Absolute: 0.7 10*3/uL (ref 0.1–1.0)
NEUTROS PCT: 53.9 % (ref 43.0–77.0)
Neutro Abs: 3.4 10*3/uL (ref 1.4–7.7)
PLATELETS: 192 10*3/uL (ref 150.0–400.0)
RBC: 3.44 Mil/uL — ABNORMAL LOW (ref 3.87–5.11)
RDW: 13.8 % (ref 11.5–15.5)
WBC: 6.3 10*3/uL (ref 4.0–10.5)

## 2013-12-17 MED ORDER — FUROSEMIDE 20 MG PO TABS: 20.0000 mg | ORAL_TABLET | ORAL | Status: DC

## 2013-12-17 NOTE — Assessment & Plan Note (Signed)
BP Readings from Last 3 Encounters:  12/17/13 160/80  11/05/13 158/70  10/06/13 160/90   BP slightly elevated today. Reviewed recent notes from cardiology. Will continue current medications for now.

## 2013-12-17 NOTE — Progress Notes (Signed)
Pre visit review using our clinic review tool, if applicable. No additional management support is needed unless otherwise documented below in the visit note. 

## 2013-12-17 NOTE — Assessment & Plan Note (Signed)
2nd degree burn right hand. Will apply Silvadene bid. Follow up prn if any concerns.

## 2013-12-17 NOTE — Assessment & Plan Note (Signed)
Offered support today given recent death of her sister. She also has strong support from her family.

## 2013-12-17 NOTE — Assessment & Plan Note (Signed)
Previous h/o anemia. Will recheck CBC with labs today.

## 2013-12-17 NOTE — Patient Instructions (Signed)
Labs today.   Follow up in 3 months.  

## 2013-12-17 NOTE — Progress Notes (Signed)
Subjective:    Patient ID: Dawn Cooper, female    DOB: [birth year], 80 y.o.   MRN: [id number]  HPI 80YO female presents for follow up.  Physically feeling well. Able to do her own yardwork. Occasionally, has mild shortness of breath with exertion and feels "swimmy-headed" but prefers not to pursue further evaluation for aortic valve replacement at this time.  No recent chest pain, palpitations. Compliant with medications.  Sad because of sister's recent death from a stroke. This is her last sibling to pass away.  Notes that she burned her hand a couple of weeks ago on a frying pain. Wound appears to be healing well. She has been applying Neosporin on occasion. Area is not painful.   Review of Systems  Constitutional: Negative for fever, chills, appetite change, fatigue and unexpected weight change.  Eyes: Negative for visual disturbance.  Respiratory: Positive for shortness of breath. Negative for cough and chest tightness.   Cardiovascular: Negative for chest pain and leg swelling.  Gastrointestinal: Negative for nausea, vomiting, abdominal pain, diarrhea and constipation.  Musculoskeletal: Negative for arthralgias and myalgias.  Skin: Negative for color change and rash.  Neurological: Negative for headaches.  Hematological: Negative for adenopathy. Does not bruise/bleed easily.  Psychiatric/Behavioral: Positive for dysphoric mood. Negative for sleep disturbance. The patient is not nervous/anxious.        Objective:    BP 160/80  Pulse 65  Temp(Src) 98 F (36.7 C) (Oral)  Ht 5' (1.524 m)  Wt 151 lb 8 oz (68.72 kg)  BMI 29.59 kg/m2  SpO2 96% Physical Exam  Constitutional: She is oriented to person, place, and time. She appears well-developed and well-nourished. No distress.  HENT:  Head: Normocephalic and atraumatic.  Right Ear: External ear normal.  Left Ear: External ear normal.  Nose: Nose normal.  Mouth/Throat: Oropharynx is clear and moist. No oropharyngeal  exudate.  Eyes: Conjunctivae are normal. Pupils are equal, round, and reactive to light. Right eye exhibits no discharge. Left eye exhibits no discharge. No scleral icterus.  Neck: Normal range of motion. Neck supple. No tracheal deviation present. No thyromegaly present.  Cardiovascular: Normal rate, regular rhythm and intact distal pulses.  Exam reveals no gallop and no friction rub.   Murmur (right sternal boarder) heard. Pulmonary/Chest: Effort normal and breath sounds normal. No accessory muscle usage. Not tachypneic. No respiratory distress. She has no decreased breath sounds. She has no wheezes. She has no rhonchi. She has no rales. She exhibits no tenderness.  Musculoskeletal: Normal range of motion. She exhibits edema (bilateral lower extremity to mid shin). She exhibits no tenderness.  Lymphadenopathy:    She has no cervical adenopathy.  Neurological: She is alert and oriented to person, place, and time. No cranial nerve deficit. She exhibits normal muscle tone. Coordination normal.  Skin: Skin is warm and dry. No rash noted. She is not diaphoretic. No erythema. No pallor.     Psychiatric: She has a normal mood and affect. Her behavior is normal. Judgment and thought content normal.          Assessment & Plan:   Problem List Items Addressed This Visit     Unprioritized   Anemia, unspecified     Previous h/o anemia. Will recheck CBC with labs today.    Relevant Orders      CBC w/Diff   Atrial fibrillation     NSR on exam today. Continue current medications.    Relevant Medications      furosemide (LASIX) tablet  Bereavement     Offered support today given recent death of her sister. She also has strong support from her family.    Burn of right hand     2nd degree burn right hand. Will apply Silvadene bid. Follow up prn if any concerns.    Edema - Primary     Stable LE edema. Continue Furosemide.    Relevant Medications      furosemide (LASIX) tablet   Other  Relevant Orders      Comp Met (CMET)   HTN (hypertension)      BP Readings from Last 3 Encounters:  12/17/13 160/80  11/05/13 158/70  10/06/13 160/90   BP slightly elevated today. Reviewed recent notes from cardiology. Will continue current medications for now.     Relevant Medications      furosemide (LASIX) tablet       Return in about 3 months (around 03/18/2014) for Recheck.

## 2013-12-17 NOTE — Assessment & Plan Note (Signed)
Stable LE edema. Continue Furosemide.

## 2013-12-17 NOTE — Assessment & Plan Note (Signed)
NSR on exam today. Continue current medications.

## 2013-12-20 LAB — FERRITIN: FERRITIN: 69.7 ng/mL (ref 10.0–291.0)

## 2013-12-27 ENCOUNTER — Other Ambulatory Visit (INDEPENDENT_AMBULATORY_CARE_PROVIDER_SITE_OTHER): Payer: Medicare Other

## 2013-12-27 DIAGNOSIS — Z1211 Encounter for screening for malignant neoplasm of colon: Secondary | ICD-10-CM

## 2013-12-27 LAB — FECAL OCCULT BLOOD, IMMUNOCHEMICAL: FECAL OCCULT BLD: NEGATIVE

## 2013-12-29 DIAGNOSIS — H3531 Nonexudative age-related macular degeneration: Secondary | ICD-10-CM | POA: Diagnosis not present

## 2014-01-03 ENCOUNTER — Telehealth: Payer: Self-pay | Admitting: *Deleted

## 2014-01-03 NOTE — Telephone Encounter (Signed)
Pt daughter called for stool results, advised her that the test was negative for blood.  She is asking if there is anything she needs to do about the worsening anemia

## 2014-01-03 NOTE — Telephone Encounter (Signed)
Looking back over the last few years, her anemia has been relatively stable. It would be reasonable to have her see a hematologist if she would like for further evaluation. I am happy to place a referral if she would like.

## 2014-01-03 NOTE — Telephone Encounter (Signed)
Notified pt's daughter, she will get back with [place] if she decided to want a hematology referral

## 2014-02-11 ENCOUNTER — Other Ambulatory Visit: Payer: Self-pay | Admitting: Internal Medicine

## 2014-03-08 ENCOUNTER — Other Ambulatory Visit: Payer: Self-pay | Admitting: Internal Medicine

## 2014-03-22 ENCOUNTER — Encounter: Payer: Self-pay | Admitting: Internal Medicine

## 2014-03-22 ENCOUNTER — Ambulatory Visit (INDEPENDENT_AMBULATORY_CARE_PROVIDER_SITE_OTHER): Payer: Medicare Other | Admitting: Internal Medicine

## 2014-03-22 VITALS — BP 142/62 | HR 64 | Temp 97.8°F | Ht 60.0 in | Wt 146.5 lb

## 2014-03-22 DIAGNOSIS — I4891 Unspecified atrial fibrillation: Secondary | ICD-10-CM

## 2014-03-22 DIAGNOSIS — R634 Abnormal weight loss: Secondary | ICD-10-CM

## 2014-03-22 DIAGNOSIS — R42 Dizziness and giddiness: Secondary | ICD-10-CM

## 2014-03-22 DIAGNOSIS — D649 Anemia, unspecified: Secondary | ICD-10-CM

## 2014-03-22 DIAGNOSIS — Z23 Encounter for immunization: Secondary | ICD-10-CM

## 2014-03-22 DIAGNOSIS — I350 Nonrheumatic aortic (valve) stenosis: Secondary | ICD-10-CM | POA: Diagnosis not present

## 2014-03-22 DIAGNOSIS — I10 Essential (primary) hypertension: Secondary | ICD-10-CM | POA: Diagnosis not present

## 2014-03-22 LAB — CBC WITH DIFFERENTIAL/PLATELET
BASOS ABS: 0 10*3/uL (ref 0.0–0.1)
Basophils Relative: 0.5 % (ref 0.0–3.0)
EOS ABS: 0.2 10*3/uL (ref 0.0–0.7)
Eosinophils Relative: 3.1 % (ref 0.0–5.0)
HCT: 31.5 % — ABNORMAL LOW (ref 36.0–46.0)
Hemoglobin: 10.5 g/dL — ABNORMAL LOW (ref 12.0–15.0)
LYMPHS PCT: 30.5 % (ref 12.0–46.0)
Lymphs Abs: 1.8 10*3/uL (ref 0.7–4.0)
MCHC: 33.2 g/dL (ref 30.0–36.0)
MCV: 94.1 fl (ref 78.0–100.0)
MONOS PCT: 9.2 % (ref 3.0–12.0)
Monocytes Absolute: 0.5 10*3/uL (ref 0.1–1.0)
NEUTROS PCT: 56.7 % (ref 43.0–77.0)
Neutro Abs: 3.3 10*3/uL (ref 1.4–7.7)
Platelets: 205 10*3/uL (ref 150.0–400.0)
RBC: 3.35 Mil/uL — ABNORMAL LOW (ref 3.87–5.11)
RDW: 13.7 % (ref 11.5–15.5)
WBC: 5.8 10*3/uL (ref 4.0–10.5)

## 2014-03-22 LAB — COMPREHENSIVE METABOLIC PANEL
ALT: 13 U/L (ref 0–35)
AST: 23 U/L (ref 0–37)
Albumin: 3.7 g/dL (ref 3.5–5.2)
Alkaline Phosphatase: 50 U/L (ref 39–117)
BUN: 24 mg/dL — AB (ref 6–23)
CO2: 24 mEq/L (ref 19–32)
CREATININE: 1.4 mg/dL — AB (ref 0.4–1.2)
Calcium: 9.7 mg/dL (ref 8.4–10.5)
Chloride: 108 mEq/L (ref 96–112)
GFR: 37.57 mL/min — AB (ref >60.00)
Glucose, Bld: 89 mg/dL (ref 70–99)
Potassium: 4.4 mEq/L (ref 3.5–5.1)
SODIUM: 141 meq/L (ref 135–145)
Total Bilirubin: 0.6 mg/dL (ref 0.2–1.2)
Total Protein: 7.1 g/dL (ref 6.0–8.3)

## 2014-03-22 LAB — TSH: TSH: 2.35 u[IU]/mL (ref 0.35–4.50)

## 2014-03-22 LAB — VITAMIN B12: VITAMIN B 12: 181 pg/mL — AB (ref 211–911)

## 2014-03-22 NOTE — Progress Notes (Signed)
Pre visit review using our clinic review tool, if applicable. No additional management support is needed unless otherwise documented below in the visit note. 

## 2014-03-22 NOTE — Assessment & Plan Note (Signed)
Chronic intermittent lightheadedness. Exam normal today. Will check CBC, CMP, TSH, B12 with labs. Follow up with cardiology as scheduled in 04/2014 for aortic stenosis.

## 2014-03-22 NOTE — Assessment & Plan Note (Signed)
She has symptoms of lightheadedness, and dyspnea with exertion, however these seem stable compared to previous visit. Question if repeat ECHO might be helpful to assess progression of aortic stenosis. Follow up with Dr. Rockey Situ as scheduled in 04/2014.

## 2014-03-22 NOTE — Progress Notes (Signed)
Subjective:    Patient ID: Dawn Cooper, female    DOB: Oct 07, 1918, 80 y.o.   MRN: [id number]  HPI 80YO female presents for follow up.  Generally feeling well. Lightheaded when walking or standing at times, ever since starting on BP medications. No chest pain. Occasional shortness of breath with exertion. No palpitation. Compliant with medication. No change in bowel habits. Appetite is down some, however was trying to lose weight.   Wt Readings from Last 3 Encounters:  03/22/14 146 lb 8 oz (66.452 kg)  12/17/13 151 lb 8 oz (68.72 kg)  11/05/13 150 lb 8 oz (68.266 kg)    Past medical, surgical, family and social history per today's encounter.  Review of Systems  Constitutional: Negative for fever, chills, appetite change, fatigue and unexpected weight change.  Eyes: Negative for visual disturbance.  Respiratory: Positive for shortness of breath (with exertion). Negative for cough.   Cardiovascular: Negative for chest pain, palpitations and leg swelling.  Gastrointestinal: Negative for nausea, vomiting, abdominal pain, diarrhea and constipation.  Skin: Negative for color change and rash.  Neurological: Positive for light-headedness. Negative for tremors, syncope, speech difficulty and weakness.  Hematological: Negative for adenopathy. Does not bruise/bleed easily.  Psychiatric/Behavioral: Negative for sleep disturbance and dysphoric mood. The patient is not nervous/anxious.        Objective:    BP 142/62 mmHg  Pulse 64  Temp(Src) 97.8 F (36.6 C) (Oral)  Ht 5' (1.524 m)  Wt 146 lb 8 oz (66.452 kg)  BMI 28.61 kg/m2  SpO2 99% Physical Exam  Constitutional: She is oriented to person, place, and time. She appears well-developed and well-nourished. No distress.  HENT:  Head: Normocephalic and atraumatic.  Right Ear: External ear normal.  Left Ear: External ear normal.  Nose: Nose normal.  Mouth/Throat: Oropharynx is clear and moist. No oropharyngeal exudate.  Eyes:  Conjunctivae are normal. Pupils are equal, round, and reactive to light. Right eye exhibits no discharge. Left eye exhibits no discharge. No scleral icterus.  Neck: Normal range of motion. Neck supple. No tracheal deviation present. No thyromegaly present.  Cardiovascular: Normal rate, regular rhythm and intact distal pulses.  Exam reveals no gallop and no friction rub.   Murmur heard.  Systolic (RUSB) murmur is present  Pulmonary/Chest: Effort normal and breath sounds normal. No accessory muscle usage. No tachypnea. No respiratory distress. She has no decreased breath sounds. She has no wheezes. She has no rhonchi. She has no rales. She exhibits no tenderness.  Musculoskeletal: Normal range of motion. She exhibits no edema or tenderness.  Lymphadenopathy:    She has no cervical adenopathy.  Neurological: She is alert and oriented to person, place, and time. No cranial nerve deficit. She exhibits normal muscle tone. Coordination normal.  Skin: Skin is warm and dry. No rash noted. She is not diaphoretic. No erythema. No pallor.  Psychiatric: She has a normal mood and affect. Her behavior is normal. Judgment and thought content normal.          Assessment & Plan:   Problem List Items Addressed This Visit      Unprioritized   Anemia    Mild anemia noted last visit. Ferritin and stool ifob normal. Will recheck CBC again today.    Aortic valve stenosis, moderate    She has symptoms of lightheadedness, and dyspnea with exertion, however these seem stable compared to previous visit. Question if repeat ECHO might be helpful to assess progression of aortic stenosis. Follow up with Dr. Rockey Situ  as scheduled in 04/2014.    Atrial fibrillation    NSR on exam today, as previous. Continue Metoprolol for rate control.    HTN (hypertension) - Primary    BP Readings from Last 3 Encounters:  03/22/14 142/62  12/17/13 160/80  11/05/13 158/70   BP well controlled on current medications. Will continue.  Renal function with labs today.    Lightheadedness    Chronic intermittent lightheadedness. Exam normal today. Will check CBC, CMP, TSH, B12 with labs. Follow up with cardiology as scheduled in 04/2014 for aortic stenosis.    Relevant Orders      TSH      CBC with Differential      Comprehensive metabolic panel      S34   Loss of weight    Wt Readings from Last 3 Encounters:  03/22/14 146 lb 8 oz (66.452 kg)  12/17/13 151 lb 8 oz (68.72 kg)  11/05/13 150 lb 8 oz (68.266 kg)   She has been trying to lose weight by limiting caloric intake per her report. Will monitor.     Other Visit Diagnoses    Need for vaccination with 13-polyvalent pneumococcal conjugate vaccine        Relevant Orders       Pneumococcal conjugate vaccine 13-valent (Completed)        Return in about 3 months (around 06/21/2014) for Recheck.

## 2014-03-22 NOTE — Assessment & Plan Note (Signed)
NSR on exam today, as previous. Continue Metoprolol for rate control.

## 2014-03-22 NOTE — Assessment & Plan Note (Signed)
Wt Readings from Last 3 Encounters:  03/22/14 146 lb 8 oz (66.452 kg)  12/17/13 151 lb 8 oz (68.72 kg)  11/05/13 150 lb 8 oz (68.266 kg)   She has been trying to lose weight by limiting caloric intake per her report. Will monitor.

## 2014-03-22 NOTE — Assessment & Plan Note (Signed)
Mild anemia noted last visit. Ferritin and stool ifob normal. Will recheck CBC again today.

## 2014-03-22 NOTE — Assessment & Plan Note (Signed)
BP Readings from Last 3 Encounters:  03/22/14 142/62  12/17/13 160/80  11/05/13 158/70   BP well controlled on current medications. Will continue. Renal function with labs today.

## 2014-03-22 NOTE — Patient Instructions (Signed)
Labs today.  Prevnar vaccine today.  Follow up in 3 months.

## 2014-03-30 ENCOUNTER — Ambulatory Visit (INDEPENDENT_AMBULATORY_CARE_PROVIDER_SITE_OTHER): Payer: Medicare Other

## 2014-03-30 DIAGNOSIS — E538 Deficiency of other specified B group vitamins: Secondary | ICD-10-CM | POA: Diagnosis not present

## 2014-03-30 MED ORDER — CYANOCOBALAMIN 1000 MCG/ML IJ SOLN
1000.0000 ug | Freq: Once | INTRAMUSCULAR | Status: AC
  Administered 2014-04-06: 1000 ug via INTRAMUSCULAR

## 2014-04-06 ENCOUNTER — Ambulatory Visit (INDEPENDENT_AMBULATORY_CARE_PROVIDER_SITE_OTHER): Payer: Medicare Other | Admitting: *Deleted

## 2014-04-06 DIAGNOSIS — E538 Deficiency of other specified B group vitamins: Secondary | ICD-10-CM

## 2014-04-06 MED ORDER — CYANOCOBALAMIN 1000 MCG/ML IJ SOLN
1000.0000 ug | Freq: Once | INTRAMUSCULAR | Status: AC
  Administered 2014-04-06: 1000 ug via INTRAMUSCULAR

## 2014-04-13 ENCOUNTER — Ambulatory Visit (INDEPENDENT_AMBULATORY_CARE_PROVIDER_SITE_OTHER): Payer: Medicare Other | Admitting: *Deleted

## 2014-04-13 DIAGNOSIS — E538 Deficiency of other specified B group vitamins: Secondary | ICD-10-CM | POA: Diagnosis not present

## 2014-04-13 MED ORDER — CYANOCOBALAMIN 1000 MCG/ML IJ SOLN
1000.0000 ug | Freq: Once | INTRAMUSCULAR | Status: AC
  Administered 2014-04-13: 1000 ug via INTRAMUSCULAR

## 2014-04-23 ENCOUNTER — Other Ambulatory Visit: Payer: Self-pay | Admitting: Cardiovascular Disease

## 2014-05-05 ENCOUNTER — Ambulatory Visit (INDEPENDENT_AMBULATORY_CARE_PROVIDER_SITE_OTHER): Payer: Medicare Other | Admitting: Cardiovascular Disease

## 2014-05-05 ENCOUNTER — Encounter: Payer: Self-pay | Admitting: Cardiovascular Disease

## 2014-05-05 VITALS — BP 140/72 | HR 63 | Ht 64.0 in | Wt 145.8 lb

## 2014-05-05 DIAGNOSIS — R609 Edema, unspecified: Secondary | ICD-10-CM

## 2014-05-05 DIAGNOSIS — R0602 Shortness of breath: Secondary | ICD-10-CM | POA: Diagnosis not present

## 2014-05-05 DIAGNOSIS — I350 Nonrheumatic aortic (valve) stenosis: Secondary | ICD-10-CM | POA: Diagnosis not present

## 2014-05-05 DIAGNOSIS — I4891 Unspecified atrial fibrillation: Secondary | ICD-10-CM

## 2014-05-05 DIAGNOSIS — I10 Essential (primary) hypertension: Secondary | ICD-10-CM | POA: Diagnosis not present

## 2014-05-05 NOTE — Assessment & Plan Note (Signed)
Maintaining normal sinus rhythm. No changes to her medications 

## 2014-05-05 NOTE — Assessment & Plan Note (Signed)
Blood pressure is well controlled on today's visit. No changes made to the medications. 

## 2014-05-05 NOTE — Progress Notes (Signed)
Patient ID: Dawn Cooper, female    DOB: 1918/04/07, 81 y.o.   MRN: [id number]  HPI Comments: Dawn Cooper is a very pleasant 31 -year-old woman with moderate aortic valve stenosis in 2012,  who presented to the hospital on January 02 2011 with  atrial fibrillation with RVR, elevated BNP, CT scan showing bilateral moderate sized pleural effusions who was started on rate control and pradaxa. She refused cardioversion after 4 weeks on anticoagulation. At approximately 6 weeks out, she had a AVM bleed and presented to the hospital with melena and hematocrit of 18. She had been having melena for at least one week and did not go to the hospital. She had EGD with treatment of her AVM. All anticoagulation was held.  We had discussed the case with Dr. Gustavo Cooper and it was felt that given her risk of further bleeding from additional AVMs and given her age and the extent of her recent bleed, that no further anticoagulation be used.   She presents today for routine follow-up of her aortic valve stenosis  In general she reports that she is doing well. Recent echocardiogram showing severe aortic valve stenosis. She denies any significant shortness of breath, chest pain, lightheadedness or dizziness. We spent much of today's discussion concerning progression of her aortic valve stenosis and workup and possible surgery in the future. She is active, no recent falls. Walks with a cane She has chronic lower extremity edema on the right from previous broken leg in 2013. Recent lab work showing anemia with hematocrit 31 In followup today, she reports that she is doing well in general.   She is relatively active at does not do regular exercise program.. She takes Lasix every other day.   EKG shows normal sinus rhythm with rate 63 bpm, left bundle branch block   fall in November 2013. She broke her leg and had surgery with a rod placed on the right.   Echocardiogram while she was in atrial fibrillation showed  ejection fraction 35-45%, normal left ventricular systolic pressures, moderate aortic valve stenosis with mean gradient of 32 mmHg  Previous lab work:  total cholesterol 132, LDL 82, HDL 40, triglycerides 502, hemoglobin A1c 4.9  Previous CT Scan of the chest showing moderate bilateral pleural effusions with compressive atelectasis in both lower lobes, no PE   Allergies  Allergen Reactions  . Clonidine Derivatives     Itching, rash  . Penicillins     hives    Outpatient Encounter Prescriptions as of 05/05/2014  Medication Sig  . Acetaminophen (TYLENOL PO) Take by mouth. Takes 1-2 tablets prn.  . furosemide (LASIX) 20 MG tablet Take 1 tablet (20 mg total) by mouth every other day.  . hydrALAZINE (APRESOLINE) 50 MG tablet Take 50 mg by mouth 3 (three) times daily as needed.  . isosorbide mononitrate (IMDUR) 30 MG 24 hr tablet Take 1 tablet (30 mg total) by mouth daily.  Marland Kitchen lisinopril (PRINIVIL,ZESTRIL) 40 MG tablet TAKE 1 TABLET EVERY DAY  . metoprolol tartrate (LOPRESSOR) 25 MG tablet TAKE 1 TABLET TWICE DAILY  . pantoprazole (PROTONIX) 40 MG tablet TAKE 1 TABLET DAILY  . potassium chloride (K-DUR,KLOR-CON) 10 MEQ tablet Take 1 tablet (10 mEq total) by mouth every other day.    Past Medical History  Diagnosis Date  . Hypertension   . GERD (gastroesophageal reflux disease)   . A-fib   . Mild mitral regurgitation by prior echocardiogram   . Tricuspid regurgitation   . Cardiomyopathy   . Moderate  aortic stenosis   . Hiatal hernia   . Broken leg     right    Past Surgical History  Procedure Laterality Date  . Appendectomy    . Gallbladder surgery    . Cholecystectomy    . Vaginal delivery    . Cataract extraction  2013    left  . Femur fracture surgery  12/2011    Dr. Sabra Cooper, s/p rehab at [facility]  reports that she has never smoked. She has never used smokeless tobacco. She reports that she does not drink alcohol or use illicit drugs.  Family  History family history includes Cancer in her brother and sister; Stroke in her sister.        Review of Systems  Constitutional: Negative.   Respiratory: Negative.   Cardiovascular: Negative.   Gastrointestinal: Negative.   Musculoskeletal: Positive for gait problem.  Skin: Negative.   Neurological: Negative.   Hematological: Negative.   Psychiatric/Behavioral: Negative.   All other systems reviewed and are negative.    BP 140/72 mmHg  Pulse 63  Ht 5\' 4"  (1.626 m)  Wt 145 lb 12 oz (66.112 kg)  BMI 25.01 kg/m2  Physical Exam  Constitutional: She is oriented to person, place, and time. She appears well-developed and well-nourished.  HENT:  Head: Normocephalic.  Nose: Nose normal.  Mouth/Throat: Oropharynx is clear and moist.  Eyes: Conjunctivae are normal. Pupils are equal, round, and reactive to light.  Neck: Normal range of motion. Neck supple. No JVD present.  Cardiovascular: Regular rhythm, S1 normal, S2 normal and intact distal pulses.  Bradycardia present.  Exam reveals no gallop and no friction rub.   Murmur heard.  Crescendo systolic murmur is present with a grade of 2/6  Pulmonary/Chest: Effort normal and breath sounds normal. No respiratory distress. She has no wheezes. She has no rales. She exhibits no tenderness.  Abdominal: Soft. Bowel sounds are normal. She exhibits no distension. There is no tenderness.  Musculoskeletal: Normal range of motion. She exhibits no edema or tenderness.  Trace edema to the low shins bilaterally  Lymphadenopathy:    She has no cervical adenopathy.  Neurological: She is alert and oriented to person, place, and time. Coordination normal.  Skin: Skin is warm and dry. No rash noted. No erythema.  Psychiatric: She has a normal mood and affect. Her behavior is normal. Judgment and thought content normal.    Assessment and Plan  Nursing note and vitals reviewed.

## 2014-05-05 NOTE — Assessment & Plan Note (Signed)
Recommended compression hose of her right lower extremity for venous insufficiency.

## 2014-05-05 NOTE — Patient Instructions (Signed)
You are doing well. No medication changes were made.  Echocardiogram in 08/2014 for aortic valve stenosis  Please call us if you have new issues that need to be addressed before your next appt.  Your physician wants you to follow-up in: 6 months.  You will receive a reminder letter in the mail two months in advance. If you don't receive a letter, please call our office to schedule the follow-up appointment.

## 2014-05-05 NOTE — Assessment & Plan Note (Signed)
We spent much of today's visit talking about her aortic valve, disease, parameters, timing of workup and surgery. We will repeat echocardiogram June 2016 and make further decisions at that time. Currently relatively asymptomatic

## 2014-05-17 ENCOUNTER — Ambulatory Visit: Payer: Medicare Other

## 2014-05-18 ENCOUNTER — Ambulatory Visit: Payer: Medicare Other

## 2014-05-19 ENCOUNTER — Inpatient Hospital Stay: Payer: Self-pay | Admitting: Internal Medicine

## 2014-05-19 ENCOUNTER — Ambulatory Visit (INDEPENDENT_AMBULATORY_CARE_PROVIDER_SITE_OTHER): Payer: Medicare Other | Admitting: *Deleted

## 2014-05-19 DIAGNOSIS — S32511D Fracture of superior rim of right pubis, subsequent encounter for fracture with routine healing: Secondary | ICD-10-CM | POA: Diagnosis not present

## 2014-05-19 DIAGNOSIS — I639 Cerebral infarction, unspecified: Secondary | ICD-10-CM | POA: Diagnosis not present

## 2014-05-19 DIAGNOSIS — S3289XA Fracture of other parts of pelvis, initial encounter for closed fracture: Secondary | ICD-10-CM | POA: Diagnosis not present

## 2014-05-19 DIAGNOSIS — R001 Bradycardia, unspecified: Secondary | ICD-10-CM | POA: Diagnosis not present

## 2014-05-19 DIAGNOSIS — Z880 Allergy status to penicillin: Secondary | ICD-10-CM | POA: Diagnosis not present

## 2014-05-19 DIAGNOSIS — Z9181 History of falling: Secondary | ICD-10-CM | POA: Diagnosis not present

## 2014-05-19 DIAGNOSIS — R4182 Altered mental status, unspecified: Secondary | ICD-10-CM | POA: Diagnosis not present

## 2014-05-19 DIAGNOSIS — I69320 Aphasia following cerebral infarction: Secondary | ICD-10-CM | POA: Diagnosis not present

## 2014-05-19 DIAGNOSIS — I6523 Occlusion and stenosis of bilateral carotid arteries: Secondary | ICD-10-CM | POA: Diagnosis present

## 2014-05-19 DIAGNOSIS — I638 Other cerebral infarction: Secondary | ICD-10-CM | POA: Diagnosis not present

## 2014-05-19 DIAGNOSIS — S32501A Unspecified fracture of right pubis, initial encounter for closed fracture: Secondary | ICD-10-CM | POA: Diagnosis not present

## 2014-05-19 DIAGNOSIS — I10 Essential (primary) hypertension: Secondary | ICD-10-CM | POA: Diagnosis not present

## 2014-05-19 DIAGNOSIS — M6281 Muscle weakness (generalized): Secondary | ICD-10-CM | POA: Diagnosis not present

## 2014-05-19 DIAGNOSIS — Z801 Family history of malignant neoplasm of trachea, bronchus and lung: Secondary | ICD-10-CM | POA: Diagnosis not present

## 2014-05-19 DIAGNOSIS — I4891 Unspecified atrial fibrillation: Secondary | ICD-10-CM | POA: Diagnosis not present

## 2014-05-19 DIAGNOSIS — E538 Deficiency of other specified B group vitamins: Secondary | ICD-10-CM | POA: Diagnosis not present

## 2014-05-19 DIAGNOSIS — N179 Acute kidney failure, unspecified: Secondary | ICD-10-CM | POA: Diagnosis not present

## 2014-05-19 DIAGNOSIS — Z800 Family history of malignant neoplasm of digestive organs: Secondary | ICD-10-CM | POA: Diagnosis not present

## 2014-05-19 DIAGNOSIS — R2689 Other abnormalities of gait and mobility: Secondary | ICD-10-CM | POA: Diagnosis not present

## 2014-05-19 DIAGNOSIS — I350 Nonrheumatic aortic (valve) stenosis: Secondary | ICD-10-CM | POA: Diagnosis not present

## 2014-05-19 DIAGNOSIS — I5042 Chronic combined systolic (congestive) and diastolic (congestive) heart failure: Secondary | ICD-10-CM | POA: Diagnosis not present

## 2014-05-19 DIAGNOSIS — H539 Unspecified visual disturbance: Secondary | ICD-10-CM | POA: Diagnosis not present

## 2014-05-19 DIAGNOSIS — R4781 Slurred speech: Secondary | ICD-10-CM | POA: Diagnosis not present

## 2014-05-19 DIAGNOSIS — I480 Paroxysmal atrial fibrillation: Secondary | ICD-10-CM | POA: Diagnosis not present

## 2014-05-19 DIAGNOSIS — Z7902 Long term (current) use of antithrombotics/antiplatelets: Secondary | ICD-10-CM | POA: Diagnosis not present

## 2014-05-19 DIAGNOSIS — I6340 Cerebral infarction due to embolism of unspecified cerebral artery: Secondary | ICD-10-CM | POA: Diagnosis not present

## 2014-05-19 DIAGNOSIS — S0990XA Unspecified injury of head, initial encounter: Secondary | ICD-10-CM | POA: Diagnosis not present

## 2014-05-19 DIAGNOSIS — S32591A Other specified fracture of right pubis, initial encounter for closed fracture: Secondary | ICD-10-CM | POA: Diagnosis not present

## 2014-05-19 DIAGNOSIS — Z79899 Other long term (current) drug therapy: Secondary | ICD-10-CM | POA: Diagnosis not present

## 2014-05-19 DIAGNOSIS — I482 Chronic atrial fibrillation: Secondary | ICD-10-CM | POA: Diagnosis present

## 2014-05-19 DIAGNOSIS — K219 Gastro-esophageal reflux disease without esophagitis: Secondary | ICD-10-CM | POA: Diagnosis not present

## 2014-05-19 DIAGNOSIS — I5022 Chronic systolic (congestive) heart failure: Secondary | ICD-10-CM | POA: Diagnosis not present

## 2014-05-19 DIAGNOSIS — I700 Atherosclerosis of aorta: Secondary | ICD-10-CM | POA: Diagnosis not present

## 2014-05-19 DIAGNOSIS — E785 Hyperlipidemia, unspecified: Secondary | ICD-10-CM | POA: Diagnosis not present

## 2014-05-19 MED ORDER — CYANOCOBALAMIN 1000 MCG/ML IJ SOLN
1000.0000 ug | Freq: Once | INTRAMUSCULAR | Status: AC
  Administered 2014-05-19: 1000 ug via INTRAMUSCULAR

## 2014-05-20 DIAGNOSIS — R001 Bradycardia, unspecified: Secondary | ICD-10-CM

## 2014-05-20 DIAGNOSIS — I4891 Unspecified atrial fibrillation: Secondary | ICD-10-CM

## 2014-05-20 DIAGNOSIS — I639 Cerebral infarction, unspecified: Secondary | ICD-10-CM

## 2014-05-20 DIAGNOSIS — I350 Nonrheumatic aortic (valve) stenosis: Secondary | ICD-10-CM

## 2014-05-21 ENCOUNTER — Ambulatory Visit: Payer: Self-pay | Admitting: Neurology

## 2014-05-23 ENCOUNTER — Encounter: Payer: Self-pay | Admitting: Internal Medicine

## 2014-05-23 DIAGNOSIS — E785 Hyperlipidemia, unspecified: Secondary | ICD-10-CM | POA: Diagnosis not present

## 2014-05-23 DIAGNOSIS — I10 Essential (primary) hypertension: Secondary | ICD-10-CM | POA: Diagnosis not present

## 2014-05-23 DIAGNOSIS — Z9181 History of falling: Secondary | ICD-10-CM | POA: Diagnosis not present

## 2014-05-23 DIAGNOSIS — I69320 Aphasia following cerebral infarction: Secondary | ICD-10-CM | POA: Diagnosis not present

## 2014-05-23 DIAGNOSIS — I350 Nonrheumatic aortic (valve) stenosis: Secondary | ICD-10-CM | POA: Diagnosis not present

## 2014-05-23 DIAGNOSIS — S32511D Fracture of superior rim of right pubis, subsequent encounter for fracture with routine healing: Secondary | ICD-10-CM | POA: Diagnosis not present

## 2014-05-23 DIAGNOSIS — I4891 Unspecified atrial fibrillation: Secondary | ICD-10-CM | POA: Diagnosis not present

## 2014-05-23 DIAGNOSIS — R2689 Other abnormalities of gait and mobility: Secondary | ICD-10-CM | POA: Diagnosis not present

## 2014-05-23 DIAGNOSIS — M6281 Muscle weakness (generalized): Secondary | ICD-10-CM | POA: Diagnosis not present

## 2014-05-23 DIAGNOSIS — I480 Paroxysmal atrial fibrillation: Secondary | ICD-10-CM | POA: Diagnosis not present

## 2014-05-23 DIAGNOSIS — K219 Gastro-esophageal reflux disease without esophagitis: Secondary | ICD-10-CM | POA: Diagnosis not present

## 2014-05-23 DIAGNOSIS — R2681 Unsteadiness on feet: Secondary | ICD-10-CM | POA: Diagnosis not present

## 2014-05-23 DIAGNOSIS — I639 Cerebral infarction, unspecified: Secondary | ICD-10-CM | POA: Diagnosis not present

## 2014-05-23 DIAGNOSIS — Z7902 Long term (current) use of antithrombotics/antiplatelets: Secondary | ICD-10-CM | POA: Diagnosis not present

## 2014-05-23 DIAGNOSIS — I63111 Cerebral infarction due to embolism of right vertebral artery: Secondary | ICD-10-CM | POA: Diagnosis not present

## 2014-05-23 DIAGNOSIS — I5022 Chronic systolic (congestive) heart failure: Secondary | ICD-10-CM | POA: Diagnosis not present

## 2014-05-24 ENCOUNTER — Encounter
Admit: 2014-05-24 | Disposition: A | Discharge status: Home / Self Care | Payer: Self-pay | Attending: Internal Medicine | Admitting: Internal Medicine

## 2014-05-25 ENCOUNTER — Telehealth: Payer: Self-pay

## 2014-05-25 NOTE — Telephone Encounter (Signed)
Attempted to contact pt regarding discharge from [facility] on 05/23/14. Left message advising her of appt w/ Dr. Rockey Situ on 06/02/14 @ 2:15. Asked her to call back w/ any questions or concerns about her discharge instructions, meds, or if she cannot keep this appt.

## 2014-05-27 ENCOUNTER — Ambulatory Visit: Payer: Medicare Other | Admitting: Nurse Practitioner

## 2014-06-02 ENCOUNTER — Encounter: Payer: Self-pay | Admitting: Cardiovascular Disease

## 2014-06-02 ENCOUNTER — Ambulatory Visit (INDEPENDENT_AMBULATORY_CARE_PROVIDER_SITE_OTHER): Payer: Medicare Other | Admitting: Cardiovascular Disease

## 2014-06-02 VITALS — BP 128/55 | HR 58 | Ht 64.0 in | Wt 142.0 lb

## 2014-06-02 DIAGNOSIS — I639 Cerebral infarction, unspecified: Secondary | ICD-10-CM | POA: Diagnosis not present

## 2014-06-02 DIAGNOSIS — I350 Nonrheumatic aortic (valve) stenosis: Secondary | ICD-10-CM | POA: Diagnosis not present

## 2014-06-02 DIAGNOSIS — I10 Essential (primary) hypertension: Secondary | ICD-10-CM

## 2014-06-02 DIAGNOSIS — I4891 Unspecified atrial fibrillation: Secondary | ICD-10-CM

## 2014-06-02 DIAGNOSIS — R2681 Unsteadiness on feet: Secondary | ICD-10-CM | POA: Diagnosis not present

## 2014-06-02 DIAGNOSIS — Z8673 Personal history of transient ischemic attack (TIA), and cerebral infarction without residual deficits: Secondary | ICD-10-CM | POA: Insufficient documentation

## 2014-06-02 NOTE — Patient Instructions (Signed)
You are doing well. No medication changes were made.  Please call us if you have new issues that need to be addressed before your next appt.  Your physician wants you to follow-up in: 6 months.  You will receive a reminder letter in the mail two months in advance. If you don't receive a letter, please call our office to schedule the follow-up appointment.   

## 2014-06-02 NOTE — Assessment & Plan Note (Signed)
Asymptomatic aortic valve stenosis. Gradient appears moderate to severe. We'll monitor for now. She is recovering from TIA/stroke

## 2014-06-02 NOTE — Assessment & Plan Note (Signed)
Currently participating in physical therapy, occupational therapy at [facility]

## 2014-06-02 NOTE — Progress Notes (Signed)
Patient ID: Dawn Cooper, female    DOB: 04-07-1918, 81 y.o.   MRN: [id number]  HPI Comments: Dawn Cooper is a very pleasant 81 -year-old woman with moderate to severe aortic valve stenosis,  who presented to the hospital on January 02 2011 with  atrial fibrillation with RVR, elevated BNP, CT scan showing bilateral moderate sized pleural effusions who was started on rate control and pradaxa. She refused cardioversion after 4 weeks on anticoagulation. At approximately 6 weeks out, she had a AVM bleed and presented to the hospital with melena and hematocrit of 18. She had been having melena for at least one week and did not go to the hospital. She had EGD with treatment of her AVM. All anticoagulation was held.  We had discussed the case with Dr. Gustavo Lah and it was felt that given her risk of further bleeding from additional AVMs and given her age and the extent of her recent bleed, that no further anticoagulation be used.  She presents today for routine follow-up of her aortic valve stenosis  She had recent hospitalization at the end of February 2016 with stroke type symptoms, stroke on MRI Family had reported a mechanical fall, imaging showed pubic ramus fracture, day prior to admission she was found to be unresponsive and would not answer verbal commands. She became more alert during the daytime, slurred speech. They brought her to the hospital 05/19/2014. She is lethargic, bradycardia, hypotension, given IV fluids with improvement of her symptoms. MRI showing multiple strokes, old Felt to be a high risk bleeding candidate, started on Plavix  Echocardiogram in the hospital showed ejection fraction 70%, moderate to severe aortic valve stenosis, moderate MR Carotid ultrasound 05/20/2014 showing moderate bilateral carotid arterial disease worse on the left  She is currently doing rehabilitation at [facility], would like to go back to independent living. She feels she is getting stronger, walking short  distances, some continued right leg weakness. Overall she is in good spirits   EKG on today's visit shows normal sinus rhythm with APCs, rate 58 bpm, interventricular conduction delay  Other past medical history   fall in November 2013. She broke her leg and had surgery with a rod placed on the right.   Echocardiogram while she was in atrial fibrillation showed ejection fraction 35-45%, normal left ventricular systolic pressures, moderate aortic valve stenosis with mean gradient of 32 mmHg  Previous lab work:  total cholesterol 132, LDL 82, HDL 40, triglycerides 502, hemoglobin A1c 4.9  Previous CT Scan of the chest showing moderate bilateral pleural effusions with compressive atelectasis in both lower lobes, no PE   Allergies  Allergen Reactions  . Clonidine Derivatives     Itching, rash  . Penicillins     hives    Outpatient Encounter Prescriptions as of 06/02/2014  Medication Sig  . Acetaminophen (TYLENOL PO) Take by mouth. Takes 1-2 tablets prn.  Marland Kitchen atorvastatin (LIPITOR) 40 MG tablet Take 40 mg by mouth daily.  . clopidogrel (PLAVIX) 75 MG tablet Take 75 mg by mouth daily.  . furosemide (LASIX) 20 MG tablet Take 1 tablet (20 mg total) by mouth every other day.  . isosorbide mononitrate (IMDUR) 30 MG 24 hr tablet Take 1 tablet (30 mg total) by mouth daily.  Marland Kitchen lisinopril (PRINIVIL,ZESTRIL) 40 MG tablet TAKE 1 TABLET EVERY DAY  . metoprolol tartrate (LOPRESSOR) 25 MG tablet TAKE 1 TABLET TWICE DAILY  . pantoprazole (PROTONIX) 40 MG tablet TAKE 1 TABLET DAILY  . potassium chloride (K-DUR,KLOR-CON) 10 MEQ  tablet Take 1 tablet (10 mEq total) by mouth every other day.  . [DISCONTINUED] hydrALAZINE (APRESOLINE) 50 MG tablet Take 50 mg by mouth 3 (three) times daily as needed.    Past Medical History  Diagnosis Date  . Hypertension   . GERD (gastroesophageal reflux disease)   . A-fib   . Mild mitral regurgitation by prior echocardiogram   . Tricuspid regurgitation   .  Cardiomyopathy   . Moderate aortic stenosis   . Hiatal hernia   . Broken leg     right    Past Surgical History  Procedure Laterality Date  . Appendectomy    . Gallbladder surgery    . Cholecystectomy    . Vaginal delivery    . Cataract extraction  2013    left  . Femur fracture surgery  12/2011    Dr. Sabra Heck, s/p rehab at [facility]  reports that she has never smoked. She has never used smokeless tobacco. She reports that she does not drink alcohol or use illicit drugs.  Family History family history includes Cancer in her brother and sister; Diabetes in her brother and another family member; Stroke in her sister. There is no history of Heart attack.       Review of Systems  Constitutional: Negative.   Respiratory: Negative.   Cardiovascular: Negative.   Gastrointestinal: Negative.   Musculoskeletal: Positive for gait problem.  Skin: Negative.   Neurological: Negative.        Leg weakness  Hematological: Negative.   Psychiatric/Behavioral: Negative.   All other systems reviewed and are negative.    BP 128/55 mmHg  Pulse 58  Ht 5\' 4"  (1.626 m)  Wt 142 lb (64.411 kg)  BMI 24.36 kg/m2  Physical Exam  Constitutional: She is oriented to person, place, and time. She appears well-developed and well-nourished.  HENT:  Head: Normocephalic.  Nose: Nose normal.  Mouth/Throat: Oropharynx is clear and moist.  Eyes: Conjunctivae are normal. Pupils are equal, round, and reactive to light.  Neck: Normal range of motion. Neck supple. No JVD present.  Cardiovascular: Regular rhythm, S1 normal, S2 normal and intact distal pulses.  Bradycardia present.  Exam reveals no gallop and no friction rub.   Murmur heard.  Crescendo systolic murmur is present with a grade of 2/6  Pulmonary/Chest: Effort normal and breath sounds normal. No respiratory distress. She has no wheezes. She has no rales. She exhibits no tenderness.  Abdominal: Soft. Bowel sounds are normal.  She exhibits no distension. There is no tenderness.  Musculoskeletal: Normal range of motion. She exhibits no edema or tenderness.  Trace edema to the low shins bilaterally  Lymphadenopathy:    She has no cervical adenopathy.  Neurological: She is alert and oriented to person, place, and time. Coordination normal.  Skin: Skin is warm and dry. No rash noted. No erythema.  Psychiatric: She has a normal mood and affect. Her behavior is normal. Judgment and thought content normal.    Assessment and Plan  Nursing note and vitals reviewed.

## 2014-06-02 NOTE — Assessment & Plan Note (Signed)
Prior strokes seen on MRI. Likely had recent TIA. Moderate carotid arterial disease. No recent evidence of atrial fibrillation. On Plavix. High bleeding risk on full strength anticoagulation with GI bleed in 2012, AV malformations, previously seen by GI

## 2014-06-02 NOTE — Assessment & Plan Note (Signed)
Maintaining normal sinus rhythm. Unable to place her on full anticoagulation given prior GI bleed history. No recent evidence of atrial fibrillation though she does have strokes. We'll continue on Plavix

## 2014-06-07 DIAGNOSIS — I63111 Cerebral infarction due to embolism of right vertebral artery: Secondary | ICD-10-CM | POA: Diagnosis not present

## 2014-06-16 ENCOUNTER — Ambulatory Visit: Payer: Medicare Other

## 2014-06-16 ENCOUNTER — Telehealth: Payer: Self-pay | Admitting: Internal Medicine

## 2014-06-16 ENCOUNTER — Encounter: Payer: Self-pay | Admitting: Internal Medicine

## 2014-06-16 ENCOUNTER — Ambulatory Visit (INDEPENDENT_AMBULATORY_CARE_PROVIDER_SITE_OTHER): Payer: Medicare Other | Admitting: Internal Medicine

## 2014-06-16 VITALS — BP 93/64 | HR 65 | Temp 97.8°F | Ht 60.0 in | Wt 139.2 lb

## 2014-06-16 DIAGNOSIS — K219 Gastro-esophageal reflux disease without esophagitis: Secondary | ICD-10-CM | POA: Diagnosis not present

## 2014-06-16 DIAGNOSIS — I639 Cerebral infarction, unspecified: Secondary | ICD-10-CM

## 2014-06-16 DIAGNOSIS — I480 Paroxysmal atrial fibrillation: Secondary | ICD-10-CM

## 2014-06-16 DIAGNOSIS — R42 Dizziness and giddiness: Secondary | ICD-10-CM

## 2014-06-16 DIAGNOSIS — I350 Nonrheumatic aortic (valve) stenosis: Secondary | ICD-10-CM

## 2014-06-16 DIAGNOSIS — D649 Anemia, unspecified: Secondary | ICD-10-CM

## 2014-06-16 DIAGNOSIS — Z7189 Other specified counseling: Secondary | ICD-10-CM | POA: Insufficient documentation

## 2014-06-16 DIAGNOSIS — S32591D Other specified fracture of right pubis, subsequent encounter for fracture with routine healing: Secondary | ICD-10-CM | POA: Diagnosis not present

## 2014-06-16 DIAGNOSIS — N183 Chronic kidney disease, stage 3 unspecified: Secondary | ICD-10-CM

## 2014-06-16 DIAGNOSIS — I10 Essential (primary) hypertension: Secondary | ICD-10-CM

## 2014-06-16 DIAGNOSIS — I5020 Unspecified systolic (congestive) heart failure: Secondary | ICD-10-CM | POA: Diagnosis not present

## 2014-06-16 DIAGNOSIS — I69398 Other sequelae of cerebral infarction: Secondary | ICD-10-CM | POA: Diagnosis not present

## 2014-06-16 DIAGNOSIS — Z9181 History of falling: Secondary | ICD-10-CM | POA: Diagnosis not present

## 2014-06-16 DIAGNOSIS — M6281 Muscle weakness (generalized): Secondary | ICD-10-CM | POA: Diagnosis not present

## 2014-06-16 DIAGNOSIS — I4891 Unspecified atrial fibrillation: Secondary | ICD-10-CM | POA: Diagnosis not present

## 2014-06-16 LAB — CBC WITH DIFFERENTIAL/PLATELET
Basophils Absolute: 0 10*3/uL (ref 0.0–0.1)
Basophils Relative: 0.4 % (ref 0.0–3.0)
Eosinophils Absolute: 0.2 10*3/uL (ref 0.0–0.7)
Eosinophils Relative: 2.5 % (ref 0.0–5.0)
HCT: 35.8 % — ABNORMAL LOW (ref 36.0–46.0)
Hemoglobin: 12.3 g/dL (ref 12.0–15.0)
LYMPHS PCT: 30.2 % (ref 12.0–46.0)
Lymphs Abs: 2.9 10*3/uL (ref 0.7–4.0)
MCHC: 34.4 g/dL (ref 30.0–36.0)
MCV: 89.5 fl (ref 78.0–100.0)
MONO ABS: 0.5 10*3/uL (ref 0.1–1.0)
MONOS PCT: 5.7 % (ref 3.0–12.0)
NEUTROS ABS: 5.9 10*3/uL (ref 1.4–7.7)
NEUTROS PCT: 61.2 % (ref 43.0–77.0)
PLATELETS: 277 10*3/uL (ref 150.0–400.0)
RBC: 3.99 Mil/uL (ref 3.87–5.11)
RDW: 13.3 % (ref 11.5–15.5)
WBC: 9.6 10*3/uL (ref 4.0–10.5)

## 2014-06-16 LAB — COMPREHENSIVE METABOLIC PANEL
ALBUMIN: 3.7 g/dL (ref 3.5–5.2)
ALT: 9 U/L (ref 0–35)
AST: 19 U/L (ref 0–37)
Alkaline Phosphatase: 95 U/L (ref 39–117)
BUN: 36 mg/dL — AB (ref 6–23)
CALCIUM: 9.9 mg/dL (ref 8.4–10.5)
CHLORIDE: 102 meq/L (ref 96–112)
CO2: 27 mEq/L (ref 19–32)
Creatinine, Ser: 2.76 mg/dL — ABNORMAL HIGH (ref 0.40–1.20)
GFR: 17.02 mL/min — ABNORMAL LOW (ref >60.00)
Glucose, Bld: 166 mg/dL — ABNORMAL HIGH (ref 70–99)
POTASSIUM: 4.6 meq/L (ref 3.5–5.1)
Sodium: 136 mEq/L (ref 135–145)
Total Bilirubin: 0.5 mg/dL (ref 0.2–1.2)
Total Protein: 7 g/dL (ref 6.0–8.3)

## 2014-06-16 LAB — TSH: TSH: 1.59 u[IU]/mL (ref 0.35–4.50)

## 2014-06-16 MED ORDER — CYANOCOBALAMIN 1000 MCG/ML IJ SOLN
1000.0000 ug | Freq: Once | INTRAMUSCULAR | Status: AC
  Administered 2014-06-16: 1000 ug via INTRAMUSCULAR

## 2014-06-16 MED ORDER — CLOPIDOGREL BISULFATE 75 MG PO TABS: 75.0000 mg | ORAL_TABLET | Freq: Every day | ORAL | Status: DC

## 2014-06-16 MED ORDER — ATORVASTATIN CALCIUM 40 MG PO TABS: 40.0000 mg | ORAL_TABLET | Freq: Every day | ORAL | Status: DC

## 2014-06-16 NOTE — Assessment & Plan Note (Signed)
NSR today. Avoiding anticoagulation because of previous GI bleeding. Continue Metoprolol.

## 2014-06-16 NOTE — Assessment & Plan Note (Signed)
BP Readings from Last 3 Encounters:  06/16/14 93/64  06/02/14 128/55  05/05/14 140/72   BP low today. Will monitor closely. Check renal function with labs. Encouraged adequate hydration. Follow up in 1 week.

## 2014-06-16 NOTE — Telephone Encounter (Signed)
3:45pm on Wednesday.

## 2014-06-16 NOTE — Assessment & Plan Note (Signed)
Repeat CBC with labs today. 

## 2014-06-16 NOTE — Assessment & Plan Note (Signed)
Renal function with labs today.  

## 2014-06-16 NOTE — Assessment & Plan Note (Signed)
Recent ECHO showed severe AS. Reviewed cardiology notes. Plan to monitor for now. Continue ACEi, BB.

## 2014-06-16 NOTE — Progress Notes (Signed)
Pre visit review using our clinic review tool, if applicable. No additional management support is needed unless otherwise documented below in the visit note. 

## 2014-06-16 NOTE — Assessment & Plan Note (Signed)
S/p CVA in 04/2014. On Plavix, Atorvastatin.

## 2014-06-16 NOTE — Patient Instructions (Signed)
Labs today.  Follow up next week for blood pressure check.  Call immediately if worsening symptoms.

## 2014-06-16 NOTE — Telephone Encounter (Signed)
Pt needs a 1wk for bp check (51min). Please advise where to add to schedule/msn

## 2014-06-16 NOTE — Assessment & Plan Note (Signed)
Encouraged patient and her daughter to discuss her goals of care. She has multiple medical issues, all of which can be life limiting. Encouraged them to discuss goal of care and make plans for future. She appears reluctant to discuss this.

## 2014-06-16 NOTE — Telephone Encounter (Signed)
Please advise where we can put her on the schedule

## 2014-06-16 NOTE — Progress Notes (Signed)
Subjective:    Patient ID: Dawn Cooper, female    DOB: [birth year], 81 y.o.   MRN: [id number]  HPI  81YO female presents for follow up.  Recently seen by cardiology. ECHO showed severe AS.  Recently admitted to [facility] and discharged from [facility] on Tuesday 3/22. Had been feeling fatigued at home. Fell over onto floor, and fractured pelvis. They did not immediately call EMS, however later that evening she was unresponsive to family. EMS took to ED. Evaluation with MRI brain showed prior stroke but no acute stroke.  Feeling lightheaded today. No CP or dyspnea. Prior to today feeling fine. No NVD. Appetite is poor. No slurred speech, focal numbness or weakness. No urinary symptoms.  She has not discussed end of life care with family. Daughter, Inez Catalina, is HCPOA.   BP Readings from Last 3 Encounters:  06/16/14 93/64  06/02/14 128/55  05/05/14 140/72    Past medical, surgical, family and social history per today's encounter.  Review of Systems  Constitutional: Positive for fatigue. Negative for fever, chills, appetite change and unexpected weight change.  Eyes: Negative for visual disturbance.  Respiratory: Negative for shortness of breath.   Cardiovascular: Negative for chest pain and leg swelling.  Gastrointestinal: Negative for nausea, vomiting, abdominal pain, diarrhea and constipation.  Musculoskeletal: Positive for gait problem (unsteady. Uses walker). Negative for myalgias and arthralgias.  Skin: Negative for color change and rash.  Neurological: Positive for light-headedness. Negative for speech difficulty, weakness and numbness.  Hematological: Negative for adenopathy. Does not bruise/bleed easily.  Psychiatric/Behavioral: Negative for sleep disturbance and dysphoric mood. The patient is not nervous/anxious.        Objective:    BP 93/64 mmHg  Pulse 65  Temp(Src) 97.8 F (36.6 C) (Oral)  Ht 5' (1.524 m)  Wt 139 lb 4 oz (63.163 kg)  BMI 27.20 kg/m2  SpO2  97% Physical Exam  Constitutional: She is oriented to person, place, and time. She appears well-developed and well-nourished. No distress.  HENT:  Head: Normocephalic and atraumatic.  Right Ear: External ear normal.  Left Ear: External ear normal.  Nose: Nose normal.  Mouth/Throat: Oropharynx is clear and moist. No oropharyngeal exudate.  Eyes: Conjunctivae are normal. Pupils are equal, round, and reactive to light. Right eye exhibits no discharge. Left eye exhibits no discharge. No scleral icterus.  Neck: Normal range of motion. Neck supple. No tracheal deviation present. No thyromegaly present.  Cardiovascular: Normal rate, regular rhythm and intact distal pulses.  Exam reveals no gallop and no friction rub.   Murmur heard.  Systolic (RSB) murmur is present  Pulmonary/Chest: Effort normal and breath sounds normal. No respiratory distress. She has no wheezes. She has no rales. She exhibits no tenderness.  Musculoskeletal: Normal range of motion. She exhibits no edema or tenderness.  Lymphadenopathy:    She has no cervical adenopathy.  Neurological: She is alert and oriented to person, place, and time. No cranial nerve deficit. She exhibits normal muscle tone. Coordination normal.  Skin: Skin is warm and dry. No rash noted. She is not diaphoretic. No erythema. No pallor.  Psychiatric: She has a normal mood and affect. Her behavior is normal. Judgment and thought content normal.          Assessment & Plan:   Problem List Items Addressed This Visit      Unprioritized   Anemia    Repeat CBC with labs today.      Relevant Medications   cyanocobalamin ((VITAMIN B-12)) injection 1,000  mcg (Completed)   Other Relevant Orders   CBC with Differential/Platelet   Aortic valve stenosis, severe    Recent ECHO showed severe AS. Reviewed cardiology notes. Plan to monitor for now. Continue ACEi, BB.      Relevant Medications   atorvastatin (LIPITOR) tablet   Atrial fibrillation    NSR  today. Avoiding anticoagulation because of previous GI bleeding. Continue Metoprolol.      Relevant Medications   atorvastatin (LIPITOR) tablet   CKD (chronic kidney disease), stage III    Renal function with labs today.      Counseling regarding end of life decision making    Encouraged patient and her daughter to discuss her goals of care. She has multiple medical issues, all of which can be life limiting. Encouraged them to discuss goal of care and make plans for future. She appears reluctant to discuss this.      CVA (cerebral vascular accident)    S/p CVA in 04/2014. On Plavix, Atorvastatin.      Relevant Medications   atorvastatin (LIPITOR) tablet   HTN (hypertension) - Primary    BP Readings from Last 3 Encounters:  06/16/14 93/64  06/02/14 128/55  05/05/14 140/72   BP low today. Will monitor closely. Check renal function with labs. Encouraged adequate hydration. Follow up in 1 week.      Relevant Medications   atorvastatin (LIPITOR) tablet   Other Relevant Orders   Comprehensive metabolic panel   Lightheaded    Pt complains of generalized lightheadedness today. Hypotensive on exam. Daughter reports poor po intake. Will check CBC, TSH, CMP with labs. Discussed increasing hydration. Encouraged family to discuss her goals of care, ie would she want aggressive interventions if recurrent stroke, cardiac failure. If symptoms are not improving, may need evaluation this weekend in the ED.      Relevant Orders   TSH       Return in about 1 week (around 06/23/2014) for Recheck of Blood Pressure.

## 2014-06-16 NOTE — Assessment & Plan Note (Signed)
Pt complains of generalized lightheadedness today. Hypotensive on exam. Daughter reports poor po intake. Will check CBC, TSH, CMP with labs. Discussed increasing hydration. Encouraged family to discuss her goals of care, ie would she want aggressive interventions if recurrent stroke, cardiac failure. If symptoms are not improving, may need evaluation this weekend in the ED.

## 2014-06-20 ENCOUNTER — Other Ambulatory Visit: Payer: Self-pay | Admitting: Internal Medicine

## 2014-06-20 ENCOUNTER — Telehealth: Payer: Self-pay | Admitting: *Deleted

## 2014-06-20 ENCOUNTER — Other Ambulatory Visit (INDEPENDENT_AMBULATORY_CARE_PROVIDER_SITE_OTHER): Payer: Medicare Other

## 2014-06-20 DIAGNOSIS — N179 Acute kidney failure, unspecified: Secondary | ICD-10-CM | POA: Diagnosis not present

## 2014-06-20 LAB — BASIC METABOLIC PANEL
BUN: 40 mg/dL — ABNORMAL HIGH (ref 6–23)
CO2: 28 meq/L (ref 19–32)
Calcium: 9.7 mg/dL (ref 8.4–10.5)
Chloride: 102 mEq/L (ref 96–112)
Creatinine, Ser: 2.3 mg/dL — ABNORMAL HIGH (ref 0.40–1.20)
GFR: 21 mL/min — AB (ref >60.00)
Glucose, Bld: 125 mg/dL — ABNORMAL HIGH (ref 70–99)
Potassium: 5 mEq/L (ref 3.5–5.1)
SODIUM: 134 meq/L — AB (ref 135–145)

## 2014-06-20 NOTE — Telephone Encounter (Signed)
BMP for acute renal failure 

## 2014-06-20 NOTE — Telephone Encounter (Signed)
Labs and dx?  

## 2014-06-21 DIAGNOSIS — M6281 Muscle weakness (generalized): Secondary | ICD-10-CM | POA: Diagnosis not present

## 2014-06-21 DIAGNOSIS — I69398 Other sequelae of cerebral infarction: Secondary | ICD-10-CM | POA: Diagnosis not present

## 2014-06-21 DIAGNOSIS — I4891 Unspecified atrial fibrillation: Secondary | ICD-10-CM | POA: Diagnosis not present

## 2014-06-21 DIAGNOSIS — I5020 Unspecified systolic (congestive) heart failure: Secondary | ICD-10-CM | POA: Diagnosis not present

## 2014-06-21 DIAGNOSIS — I10 Essential (primary) hypertension: Secondary | ICD-10-CM | POA: Diagnosis not present

## 2014-06-21 DIAGNOSIS — S32591D Other specified fracture of right pubis, subsequent encounter for fracture with routine healing: Secondary | ICD-10-CM | POA: Diagnosis not present

## 2014-06-22 ENCOUNTER — Ambulatory Visit (INDEPENDENT_AMBULATORY_CARE_PROVIDER_SITE_OTHER): Payer: Medicare Other | Admitting: Internal Medicine

## 2014-06-22 ENCOUNTER — Encounter: Payer: Self-pay | Admitting: Internal Medicine

## 2014-06-22 VITALS — BP 155/69 | HR 63 | Temp 97.6°F | Ht 60.0 in | Wt 136.1 lb

## 2014-06-22 DIAGNOSIS — I350 Nonrheumatic aortic (valve) stenosis: Secondary | ICD-10-CM

## 2014-06-22 DIAGNOSIS — N179 Acute kidney failure, unspecified: Secondary | ICD-10-CM | POA: Insufficient documentation

## 2014-06-22 DIAGNOSIS — N183 Chronic kidney disease, stage 3 unspecified: Secondary | ICD-10-CM

## 2014-06-22 DIAGNOSIS — I639 Cerebral infarction, unspecified: Secondary | ICD-10-CM

## 2014-06-22 DIAGNOSIS — I480 Paroxysmal atrial fibrillation: Secondary | ICD-10-CM

## 2014-06-22 NOTE — Assessment & Plan Note (Signed)
NSR today. Rate controlled with Metoprolol. No anticoagulation as previous GI bleed.

## 2014-06-22 NOTE — Assessment & Plan Note (Signed)
Severe AS by recent ECHO. Symptomatically doing well with no current CP, dyspnea.  Plan to continue medical management with ACEi, BB.

## 2014-06-22 NOTE — Progress Notes (Signed)
Pre visit review using our clinic review tool, if applicable. No additional management support is needed unless otherwise documented below in the visit note. 

## 2014-06-22 NOTE — Patient Instructions (Signed)
Labs today.  Follow up in 4 weeks. 

## 2014-06-22 NOTE — Assessment & Plan Note (Signed)
Recent ARF with Cr 2.76. Improved with increased fluid intake to 2.3. Will repeat BMP today. Discussed avoiding nephrotoxic meds. Appears euvolemic on exam. Discussed possible referral to nephrology, however pt declines.

## 2014-06-22 NOTE — Progress Notes (Signed)
   Subjective:    Patient ID: Dawn Cooper, female    DOB: Dec 14, 1918, 81 y.o.   MRN: [id number]  HPI  81YO female presents for follow up.  Last seen 3/24.  Continues to feel tired. No dyspnea or chest pain. Trying to increase fluid intake. No new concerns today. Compliant with medications. Not taking any OTC meds. No change in UOP. Taking Furosemide QOD.  Past medical, surgical, family and social history per today's encounter.  Review of Systems  Constitutional: Positive for fatigue. Negative for fever, chills, appetite change and unexpected weight change.  Eyes: Negative for visual disturbance.  Respiratory: Negative for shortness of breath.   Cardiovascular: Negative for chest pain, palpitations and leg swelling.  Gastrointestinal: Negative for abdominal pain, diarrhea and constipation.  Skin: Negative for color change and rash.  Hematological: Negative for adenopathy. Does not bruise/bleed easily.  Psychiatric/Behavioral: Negative for dysphoric mood. The patient is not nervous/anxious.        Objective:    BP 155/69 mmHg  Pulse 63  Temp(Src) 97.6 F (36.4 C) (Oral)  Ht 5' (1.524 m)  Wt 136 lb 2 oz (61.746 kg)  BMI 26.59 kg/m2  SpO2 98% Physical Exam  Constitutional: She is oriented to person, place, and time. She appears well-developed and well-nourished. No distress.  HENT:  Head: Normocephalic and atraumatic.  Right Ear: External ear normal.  Left Ear: External ear normal.  Nose: Nose normal.  Mouth/Throat: Oropharynx is clear and moist. No oropharyngeal exudate.  Eyes: Conjunctivae are normal. Pupils are equal, round, and reactive to light. Right eye exhibits no discharge. Left eye exhibits no discharge. No scleral icterus.  Neck: Normal range of motion. Neck supple. No tracheal deviation present. No thyromegaly present.  Cardiovascular: Normal rate, regular rhythm and intact distal pulses.   Extrasystoles are present. Exam reveals no gallop and no friction  rub.   Murmur heard.  Systolic murmur is present  Pulmonary/Chest: Effort normal and breath sounds normal. No respiratory distress. She has no wheezes. She has no rales. She exhibits no tenderness.  Musculoskeletal: Normal range of motion. She exhibits no edema or tenderness.  Lymphadenopathy:    She has no cervical adenopathy.  Neurological: She is alert and oriented to person, place, and time. No cranial nerve deficit. She exhibits normal muscle tone. Coordination normal.  Skin: Skin is warm and dry. No rash noted. She is not diaphoretic. No erythema. No pallor.  Psychiatric: She has a normal mood and affect. Her behavior is normal. Judgment and thought content normal.          Assessment & Plan:   Problem List Items Addressed This Visit      Unprioritized   Acute renal failure superimposed on stage 3 chronic kidney disease - Primary    Recent ARF with Cr 2.76. Improved with increased fluid intake to 2.3. Will repeat BMP today. Discussed avoiding nephrotoxic meds. Appears euvolemic on exam. Discussed possible referral to nephrology, however pt declines.      Relevant Orders   Basic Metabolic Panel (BMET)   Aortic valve stenosis, severe    Severe AS by recent ECHO. Symptomatically doing well with no current CP, dyspnea.  Plan to continue medical management with ACEi, BB.       Atrial fibrillation    NSR today. Rate controlled with Metoprolol. No anticoagulation as previous GI bleed.          Return in about 4 weeks (around 07/20/2014) for Recheck.

## 2014-06-23 DIAGNOSIS — I4891 Unspecified atrial fibrillation: Secondary | ICD-10-CM | POA: Diagnosis not present

## 2014-06-23 DIAGNOSIS — I5020 Unspecified systolic (congestive) heart failure: Secondary | ICD-10-CM | POA: Diagnosis not present

## 2014-06-23 DIAGNOSIS — I10 Essential (primary) hypertension: Secondary | ICD-10-CM | POA: Diagnosis not present

## 2014-06-23 DIAGNOSIS — M6281 Muscle weakness (generalized): Secondary | ICD-10-CM | POA: Diagnosis not present

## 2014-06-23 DIAGNOSIS — I69398 Other sequelae of cerebral infarction: Secondary | ICD-10-CM | POA: Diagnosis not present

## 2014-06-23 DIAGNOSIS — S32591D Other specified fracture of right pubis, subsequent encounter for fracture with routine healing: Secondary | ICD-10-CM | POA: Diagnosis not present

## 2014-06-23 LAB — BASIC METABOLIC PANEL
BUN: 36 mg/dL — ABNORMAL HIGH (ref 6–23)
CALCIUM: 10.1 mg/dL (ref 8.4–10.5)
CO2: 25 mEq/L (ref 19–32)
Chloride: 101 mEq/L (ref 96–112)
Creatinine, Ser: 1.99 mg/dL — ABNORMAL HIGH (ref 0.40–1.20)
GFR: 24.82 mL/min — ABNORMAL LOW (ref >60.00)
GLUCOSE: 99 mg/dL (ref 70–99)
Potassium: 5.3 mEq/L — ABNORMAL HIGH (ref 3.5–5.1)
SODIUM: 133 meq/L — AB (ref 135–145)

## 2014-06-24 ENCOUNTER — Encounter
Admit: 2014-06-24 | Disposition: A | Discharge status: Home / Self Care | Payer: Self-pay | Attending: Internal Medicine | Admitting: Internal Medicine

## 2014-06-28 ENCOUNTER — Telehealth: Payer: Self-pay | Admitting: *Deleted

## 2014-06-28 NOTE — Telephone Encounter (Signed)
BMP for hyperkalemia 

## 2014-06-28 NOTE — Telephone Encounter (Signed)
Pt coming tomorrow what labs and dx?

## 2014-06-29 ENCOUNTER — Other Ambulatory Visit (INDEPENDENT_AMBULATORY_CARE_PROVIDER_SITE_OTHER): Payer: Medicare Other

## 2014-06-29 DIAGNOSIS — M6281 Muscle weakness (generalized): Secondary | ICD-10-CM | POA: Diagnosis not present

## 2014-06-29 DIAGNOSIS — E875 Hyperkalemia: Secondary | ICD-10-CM | POA: Diagnosis not present

## 2014-06-29 DIAGNOSIS — S32591D Other specified fracture of right pubis, subsequent encounter for fracture with routine healing: Secondary | ICD-10-CM | POA: Diagnosis not present

## 2014-06-29 DIAGNOSIS — I10 Essential (primary) hypertension: Secondary | ICD-10-CM | POA: Diagnosis not present

## 2014-06-29 DIAGNOSIS — I4891 Unspecified atrial fibrillation: Secondary | ICD-10-CM | POA: Diagnosis not present

## 2014-06-29 DIAGNOSIS — I5020 Unspecified systolic (congestive) heart failure: Secondary | ICD-10-CM | POA: Diagnosis not present

## 2014-06-29 DIAGNOSIS — I69398 Other sequelae of cerebral infarction: Secondary | ICD-10-CM | POA: Diagnosis not present

## 2014-06-29 LAB — BASIC METABOLIC PANEL
BUN: 21 mg/dL (ref 6–23)
CO2: 23 mEq/L (ref 19–32)
Calcium: 9.7 mg/dL (ref 8.4–10.5)
Chloride: 101 mEq/L (ref 96–112)
Creatinine, Ser: 1.67 mg/dL — ABNORMAL HIGH (ref 0.40–1.20)
GFR: 30.38 mL/min — ABNORMAL LOW (ref >60.00)
Glucose, Bld: 125 mg/dL — ABNORMAL HIGH (ref 70–99)
POTASSIUM: 4.3 meq/L (ref 3.5–5.1)
SODIUM: 132 meq/L — AB (ref 135–145)

## 2014-07-01 ENCOUNTER — Other Ambulatory Visit: Payer: Self-pay | Admitting: *Deleted

## 2014-07-01 MED ORDER — LISINOPRIL 40 MG PO TABS
20.0000 mg | ORAL_TABLET | Freq: Every day | ORAL | Status: DC
Start: 2014-07-01 — End: 2015-04-21

## 2014-07-04 DIAGNOSIS — S32591D Other specified fracture of right pubis, subsequent encounter for fracture with routine healing: Secondary | ICD-10-CM | POA: Diagnosis not present

## 2014-07-04 DIAGNOSIS — I69398 Other sequelae of cerebral infarction: Secondary | ICD-10-CM | POA: Diagnosis not present

## 2014-07-04 DIAGNOSIS — I4891 Unspecified atrial fibrillation: Secondary | ICD-10-CM | POA: Diagnosis not present

## 2014-07-04 DIAGNOSIS — I5020 Unspecified systolic (congestive) heart failure: Secondary | ICD-10-CM | POA: Diagnosis not present

## 2014-07-04 DIAGNOSIS — I10 Essential (primary) hypertension: Secondary | ICD-10-CM | POA: Diagnosis not present

## 2014-07-04 DIAGNOSIS — M6281 Muscle weakness (generalized): Secondary | ICD-10-CM | POA: Diagnosis not present

## 2014-07-08 DIAGNOSIS — I69398 Other sequelae of cerebral infarction: Secondary | ICD-10-CM | POA: Diagnosis not present

## 2014-07-08 DIAGNOSIS — I4891 Unspecified atrial fibrillation: Secondary | ICD-10-CM | POA: Diagnosis not present

## 2014-07-08 DIAGNOSIS — I5020 Unspecified systolic (congestive) heart failure: Secondary | ICD-10-CM | POA: Diagnosis not present

## 2014-07-08 DIAGNOSIS — I10 Essential (primary) hypertension: Secondary | ICD-10-CM | POA: Diagnosis not present

## 2014-07-08 DIAGNOSIS — S32591D Other specified fracture of right pubis, subsequent encounter for fracture with routine healing: Secondary | ICD-10-CM | POA: Diagnosis not present

## 2014-07-08 DIAGNOSIS — M6281 Muscle weakness (generalized): Secondary | ICD-10-CM | POA: Diagnosis not present

## 2014-07-12 NOTE — Consult Note (Signed)
1. pre-op consultno contraindications to surgery at this timemoderate risk for surgery with age, moderate AS, LBBB, hx afibpatient and family understand risks and would like to proceed in order to avoid complications of dvt, skin breakdown and pneumonia.HTNhx afibhx GI BleedpsoriasisCKD stage 3 Loletha Grayer, MD    Electronic Signatures: Loletha Grayer (MD) (Signed on 21-Oct-13 19:01)  Authored   Last Updated: 21-Oct-13 19:20 by Loletha Grayer (MD)

## 2014-07-12 NOTE — Consult Note (Signed)
PATIENT NAME:  Dawn Cooper, Dawn Cooper MR#:  [id number] DATE OF BIRTH:  1918/10/05  DATE OF CONSULTATION:  01/13/2012  REFERRING PHYSICIAN:  Dr. Earnestine Leys  CONSULTING PHYSICIAN:  Tana Conch. Leslye Peer, MD  PRIMARY CARE PHYSICIAN: Dr. Ronette Deter   REASON FOR CONSULTATION: Preop and postoperative management for hip fracture.   HISTORY OF PRESENT ILLNESS: This is a 81 year old female who presented after walking across the street to her neighbor's house. She twisted her foot and lost her balance and landed on her hip and was found to have a hip fracture in the Emergency Room. The patient did not have any loss of consciousness. Medical services were contacted secondary to history of left bundle branch block, history of aortic stenosis, moderate history of atrial fibrillation and hypertension. Patient besides the pain in the hip currently feels okay. Normally she is able to walk without chest pain or shortness of breath. Occasionally she does get shortness of breath with doing housework but she recently cut down a tree in her yard. She is normally very active and is interested in getting back to her usual activities as quickly as possible.   PAST MEDICAL HISTORY:  1. Left bundle branch block. 2. Aortic stenosis.  3. Paroxysmal atrial fibrillation.  4. Gastrointestinal bleed on Pradaxa in the past.  5. Hypertension.   PAST SURGICAL HISTORY:  1. Appendectomy.  2. Cholecystectomy.  3. Cataracts.   ALLERGIES: Penicillin.   MEDICATIONS:  1. Aspirin 81 mg daily.  2. Lasix 40 mg every other day.  3. Potassium chloride 10 mEq every other day. 4. Lisinopril 40 mg daily.  5. Metoprolol 25 mg 1/2 tablet twice a day. 6. Protonix 40 mg daily.   SOCIAL HISTORY: No smoking. No alcohol. No drug use. Worked in [employer] in the past. Currently lives alone.   FAMILY HISTORY: Mother had bone cancer, unknown if this was a metastatic disease or not. Father died of lung cancer.   REVIEW OF SYSTEMS:  CONSTITUTIONAL: No fever, chills, or sweats. Positive for fatigue. No weight gain. No weight loss. EYES: She used to wear glasses but since her cataract surgery able to see very well. EARS, NOSE, MOUTH, AND THROAT: Positive for postnasal drip. No sore throat. No difficulty swallowing. CARDIOVASCULAR: No chest pain. No palpitations. RESPIRATORY: Occasional shortness of breath with housework but usually is very active. No coughing. No sputum. No hemoptysis. GASTROINTESTINAL: Positive for constipation. No nausea. No vomiting. No abdominal pain. No bright red blood per rectum. No melena. GENITOURINARY: No burning on urination. No hematuria. MUSCULOSKELETAL: Positive for muscle pain. INTEGUMENT: Positive for rash on the left foot. NEUROLOGIC: No fainting or blackouts. PSYCHIATRIC: No anxiety or depression. ENDOCRINE: No thyroid problems. HEMATOLOGIC/LYMPHATIC: No anemia, no easy bruising or bleeding.   PHYSICAL EXAMINATION:  VITAL SIGNS: Temperature 98, pulse 62, respirations 16, blood pressure 129/50, pulse oximetry 97%.   GENERAL: No respiratory distress.   EYES: Conjunctivae normal. Lids normal. Pupils equal, round, and reactive to light. Extraocular muscles intact. No nystagmus.   EARS, NOSE, MOUTH, AND THROAT: Tympanic membranes no erythema. Nasal mucosa no erythema. Throat no erythema. No exudate seen. Lips and gums no lesions.   NECK: No JVD. No bruits. No lymphadenopathy. No thyromegaly. No thyroid nodules palpated.   RESPIRATORY: Lungs clear to auscultation. No use of accessory muscles to breathe. No rhonchi, rales, or wheeze heard.   CARDIOVASCULAR: S1, S2 normal. +4/5 systolic ejection murmur. Carotid upstroke 2+ bilaterally. No bruits. Dorsalis pedis pulses 1+ bilaterally. Trace edema of the lower extremity.  ABDOMEN: Soft, nontender. No organomegaly/splenomegaly. Normoactive bowel sounds. No masses felt.   LYMPHATIC: No lymph nodes in the neck.   MUSCULOSKELETAL: Right leg shortened and  externally rotated. Trace edema. No cyanosis. No clubbing.   SKIN: Positive for psoriasis patch on the left foot.   NEUROLOGIC: Cranial nerves II through XII grossly intact. Sensation intact to light touch bilateral lower extremities.   PSYCHIATRIC: Patient is oriented to person, place, and time.   LABORATORY, DIAGNOSTIC, AND RADIOLOGICAL DATA: Urinalysis negative. CT scan of the cervical spine diffuse degenerative changes. CT scan of the head no acute abnormality. Glucose 137, BUN 20, creatinine 1.35, sodium 141, potassium 4.4, chloride 108, CO2 28, calcium 8.9, GFR 40, white blood cell count 9.0, hemoglobin and hematocrit 11.9 and 33.7, platelet count 198. EKG sinus rhythm, first-degree AV block, left bundle branch block.   ASSESSMENT AND PLAN:  1. Preop consultation for hip fracture. No contraindications to surgery at this time. Patient is a moderate risk for surgery with age, moderate aortic stenosis, left bundle branch block and history of atrial fibrillation. Patient and family understand the risks of surgery and are willing to proceed to avoid complications of pneumonia, skin breakdown and deep vein thrombosis. Patient was very ambulatory and is willing to undergo the risk for surgery to walk again and become ambulatory.  2. Hypertension. Continue metoprolol if heart rate allows and lisinopril.  3. History of atrial fibrillation, currently in normal sinus rhythm. Continue aspirin after surgery.  4. History of gastrointestinal bleed while on Pradaxa. Currently doing well on aspirin alone.  5. Psoriasis. Will give triamcinolone cream for left lower extremity.  6. Chronic kidney disease, stage III. Monitor creatinine during hospitalization.   TIME SPENT ON CONSULTATION: 55 minutes.   CODE STATUS: Patient is a DO NOT RESUSCITATE.   ____________________________ Tana Conch. Leslye Peer, MD rjw:cms D: 01/13/2012 19:01:25 ET T: 01/14/2012 09:15:11 ET JOB#: [id number]  cc: Tana Conch. Leslye Peer, MD,  <Dictator> Eduard Clos. Gilford Rile, MD Park Breed, MD Marisue Brooklyn MD ELECTRONICALLY SIGNED 01/15/2012 0:11

## 2014-07-12 NOTE — Discharge Summary (Signed)
PATIENT NAME:  Dawn Cooper, MITTMAN MR#:  [id number] DATE OF BIRTH:  [birth year]  DATE OF ADMISSION:  01/13/2012 DATE OF DISCHARGE:  01/17/2012  FINAL DIAGNOSES:  1. Spiral displaced subtrochanteric fracture, right hip.  2. Left bundle branch block. 3. Aortic stenosis.  4. Paroxysmal atrial fibrillation.  5. Gastrointestinal bleed with Pradaxa in the past.  6. Hypertension.   PROCEDURE: On 01/14/2012, right subtrochanteric hip fracture, open reduction internal fixation, with a long Synthes trochanteric fixation nail.   COMPLICATIONS: None.   CONSULTATION: [facility].   DISCHARGE/HOME MEDICATIONS:  1. Aspirin 81 mg daily.  2. Lasix 40 mg daily every other day. 3. Potassium chloride every other day. 4. Lisinopril 40 mg daily.  5. Metoprolol 25 mg 1/2 tablet twice a day. 6. Protonix 40 mg daily.  7. Norco 5/325 mg p.r.n. pain.  8. Iron 325 mg daily.  9. Enteric-coated aspirin one p.o. twice a day.  10. Nystatin cream under breast p.r.n.   HISTORY OF PRESENT ILLNESS: The patient is a 81 year old female who had a mechanical fall on the date of admission while walking across the street to her neighbor's house. She twisted her foot and lost her balance. She injured her right hip and was brought to the Emergency Room where exam and x-rays revealed a spiral displaced subtrochanteric fracture of the right femur. She was admitted for medical evaluation and surgery. She was seen by the [facility] medical service and cleared for surgery. The risks and benefits of surgery were discussed with the patient and her family at length.   PAST MEDICAL HISTORY: Illnesses - as above.   ALLERGIES: Penicillin.  PAST SURGICAL HISTORY:  1. Appendectomy.  2. Cholecystectomy.  3. Cataracts.   SOCIAL HISTORY: The patient does not smoke or drink. She lives alone.   FAMILY HISTORY: Mother had bone cancer. Father died of lung cancer.  REVIEW OF SYSTEMS: Otherwise unremarkable.   PHYSICAL EXAMINATION: The  patient is alert and cooperative and oriented. Right leg was shortened and rotated with pain on motion. Neurovascular status was good distally. The skin was intact. No other orthopedic injuries were noted.   LABORATORY DATA: Laboratory data on admission was satisfactory. Initial hemoglobin was 11.9 on admission and dropped to 9.3 on the morning of surgery.   HOSPITAL COURSE: The patient was taken to surgery on 01/14/2012 and underwent rodding of the right subtrochanteric femur fracture with a long trochanteric fixation nail which was locked distally. She received 2 units of blood intraoperatively. Postoperatively she did well. There were no significant medical problems. Hemoglobin was 9.1 on the second postoperative day. She made slow progress with physical therapy due to her age. On 01/17/2012 she is stable           and ready for skilled nursing discharge. She will be seen in my office in two weeks. Rehabilitation potential is fair. She will be partial weight-bearing on the right leg. ____________________________ Park Breed, MD hem:slb D: 01/17/2012 13:48:16 ET T: 01/17/2012 14:06:27 ET JOB#: [id number]  cc: Park Breed, MD, <Dictator> Eduard Clos. Gilford Rile, MD Park Breed MD ELECTRONICALLY SIGNED 01/18/2012 1:28

## 2014-07-12 NOTE — Op Note (Signed)
PATIENT NAME:  Dawn Cooper, CLECKLEY MR#:  [id number] DATE OF BIRTH:  1918/08/18  DATE OF PROCEDURE:  01/14/2012  PREOPERATIVE DIAGNOSIS: Displaced spiral subtrochanteric fracture right femur.   POSTOPERATIVE DIAGNOSIS: Displaced spiral subtrochanteric fracture right femur.   PROCEDURE PERFORMED: Open reduction and internal fixation right subtrochanteric femoral fracture with a Synthes long trochanteric fixation nail (360 mm x 11 mm rod, 80 mm spiral blade, 44 mm distal screw).   SURGEON: Park Breed, MD  ANESTHESIA: Spinal.   COMPLICATIONS: None.   DRAINS: None.   ESTIMATED BLOOD LOSS: 250 mL.   REPLACEMENT: 1 unit packed cells.   DESCRIPTION OF PROCEDURE: Patient was brought to the Operating Room where she underwent satisfactory spinal anesthesia, was placed in supine position on the fracture table. The left leg was flexed and abducted. The right leg was placed in traction and manipulated into a satisfactory position and traction applied. She was slightly rotated internally. Fluoroscopy showed the fracture was well reduced. The hip was prepped and draped in sterile fashion and a short longitudinal incision made just above the greater trochanter. Dissection carried out sharply through subcutaneous tissue and fascia with electrocautery being used for hemostasis. A guidepin was inserted into the tip of the greater trochanter and an enlarging drill used to open the trochanter. A ball-tipped guide was passed down the shaft of the femur without difficulty onto the knee. Preoperative measurements and measurement with the ball-tipped guide in place indicated a 360 mm rod would be appropriate. The femoral canal was sequentially reamed to 13 mm without much chatter. The 360 mm x 11 mm trochanteric fixation nail was then inserted and seated under fluoroscopic control. Stab wound was made laterally below the greater trochanter and dissection carried out bluntly through subcutaneous tissue. The guide for  the guidepin was passed up to the lateral cortex and guidepin inserted. It was repositioned once and was felt to be in excellent position on AP and lateral views. The pin was advanced 90 mm. I felt that an 80 mm spiral blade would be appropriate. The lateral cortex was drilled as was the tract for the spiral blade. An 80 mm spiral blade was inserted and seated fully. The set screw was advanced and tightened to hold it in place. Fluoroscopy showed the spiral blade to be in excellent position on AP and lateral views. FluoroScan was then directed toward the knee. A stab wound was made laterally just above the top of the patella and dissection carried out down to bone. Under fluoroscopic control a hole was drilled through the distal screw slot and a 44 mm screw was passed through the screw slot in the midportion. This was seen to be in good position on both AP and lateral views. Traction had been released prior to doing this. Fluoroscopy showed the fracture was very well reduced, barely visible. The wounds were then irrigated and outriggers removed. The deep fascia proximally was closed with 0 Vicryl and the subcutaneous tissue was closed with 2-0 Vicryl. 2-0 Vicryl was used on subcutaneous tissue distally. The skin was closed with staples. Dry sterile dressing was applied. The leg was taken out of traction and patient was transferred to her hospital bed. Rotation of the leg was excellent. Leg lengths were excellent. She was taken to recovery in good condition having tolerated the procedure well.   ____________________________ Park Breed, MD hem:cms D: 01/14/2012 16:59:50 ET T: 01/14/2012 17:12:49 ET JOB#: [id number]  cc: Park Breed, MD, <Dictator> Park Breed MD ELECTRONICALLY SIGNED  01/14/2012 20:47 

## 2014-07-12 NOTE — H&P (Signed)
Subjective/Chief Complaint Right leg pain    History of Present Illness 81 year old female had a mechanical fall today and injured the right leg.  Brought to Emergency Room where exam and X-rays show a spiral displaced subtrochanteric femur fracture. Admitted for medical evaluation and surgical fixation of fracture. Dicsussed with patient and family at length.  Options given and they agree with surgery.  Risks and benefits of surgery were discussed at length including but not limited to infection, non union, nerve or blood vessed damage, non union, need for repeat surgery, blood clots and lung emboli, and death. Plan on open reduction and internal fixation tomorrow.    Past Medical Health Atrial fib, hypertension   Past Med/Surgical Hx:  CHF:   ALLERGIES:  Penicillin: Hives  HOME MEDICATIONS: Medication Instructions Status  furosemide 40 mg oral tablet 1 tab(s) orally every other day along with Klor-con Active  Klor-Con 10 oral tablet, extended release 1 tab(s) orally every other day along with furosemide Active  lisinopril 40 mg oral tablet 1 tab(s) orally once a day Active  metoprolol tartrate 25 mg oral tablet 0.5 tab (12.20m) orally 2 times a day Active  pantoprazole 40 mg oral delayed release tablet 1 tab(s) orally once a day Active  aspirin 81 mg oral tablet 1 tab(s) orally once a day Active   Family and Social History:   Family History Non-Contributory    Social History negative tobacco, negative ETOH    Place of Living Home   Review of Systems:   Fever/Chills No    Cough No    Sputum No    Abdominal Pain No   Physical Exam:   GEN well developed, well nourished, no acute distress    HEENT pink conjunctivae    NECK supple    RESP normal resp effort    CARD regular rate    ABD denies tenderness    LYMPH negative neck    EXTR negative edema, Right leg short and rotated.  Severe pain with range of motion.  circulation/sensation/motor function good  distally.    SKIN normal to palpation    NEURO motor/sensory function intact    PSYCH alert, A+O to time, place, person, good insight   Lab Results: Routine Chem:  21-Oct-13 16:23    Glucose, Serum  137   BUN  20   Creatinine (comp)  1.35   Sodium, Serum 141   Potassium, Serum 4.4   Chloride, Serum  108   CO2, Serum 26   Calcium (Total), Serum 8.9   Anion Gap 7   Osmolality (calc) 286   eGFR (African American)  40   eGFR (Non-African American)  34 (eGFR values <61mmin/1.73 m2 may be an indication of chronic kidney disease (CKD). Calculated eGFR is useful in patients with stable renal function. The eGFR calculation will not be reliable in acutely ill patients when serum creatinine is changing rapidly. It is not useful in  patients on dialysis. The eGFR calculation may not be applicable to patients at the low and high extremes of body sizes, pregnant women, and vegetarians.)  Routine UA:  21-Oct-13 18:14    Color (UA) Yellow   Clarity (UA) Clear   Glucose (UA) Negative   Bilirubin (UA) Negative   Ketones (UA) Negative   Specific Gravity (UA) 1.013   Blood (UA) 1+   pH (UA) 6.0   Protein (UA) Negative   Nitrite (UA) Negative   Leukocyte Esterase (UA) Negative (Result(s) reported on 13 Jan 2012 at 06:31PM.)   RBC (UA) 2 /HPF   WBC (UA) 1 /HPF   Bacteria (UA) NONE SEEN   Epithelial Cells (UA) <1 /HPF   Mucous (UA) PRESENT   Hyaline Cast (UA) 7 /LPF (Result(s) reported on 13 Jan 2012 at 06:31PM.)  Routine Hem:  21-Oct-13 16:23    WBC (CBC) 9.0   RBC (CBC)  3.61   Hemoglobin (CBC)  11.9   Hematocrit (CBC)  33.7   Platelet Count (CBC) 198 (Result(s) reported on 13 Jan 2012 at 04:59PM.)   MCV 93   MCH 32.8   MCHC 35.2   RDW 13.1     Assessment/Admission Diagnosis Displaced spiral subtrochanteric fracture right femur    Plan open reduction and internal fixation with long TFN device tomorrow.   Electronic Signatures: Park Breed (MD)  (Signed 21-Oct-13  18:47)  Authored: CHIEF COMPLAINT and HISTORY, PAST MEDICAL/SURGIAL HISTORY, ALLERGIES, HOME MEDICATIONS, FAMILY AND SOCIAL HISTORY, REVIEW OF SYSTEMS, PHYSICAL EXAM, LABS, ASSESSMENT AND PLAN   Last Updated: 21-Oct-13 18:47 by Park Breed (MD)

## 2014-07-15 DIAGNOSIS — I5020 Unspecified systolic (congestive) heart failure: Secondary | ICD-10-CM | POA: Diagnosis not present

## 2014-07-15 DIAGNOSIS — I10 Essential (primary) hypertension: Secondary | ICD-10-CM | POA: Diagnosis not present

## 2014-07-15 DIAGNOSIS — I69398 Other sequelae of cerebral infarction: Secondary | ICD-10-CM | POA: Diagnosis not present

## 2014-07-15 DIAGNOSIS — M6281 Muscle weakness (generalized): Secondary | ICD-10-CM | POA: Diagnosis not present

## 2014-07-15 DIAGNOSIS — S32591D Other specified fracture of right pubis, subsequent encounter for fracture with routine healing: Secondary | ICD-10-CM | POA: Diagnosis not present

## 2014-07-15 DIAGNOSIS — I4891 Unspecified atrial fibrillation: Secondary | ICD-10-CM | POA: Diagnosis not present

## 2014-07-18 ENCOUNTER — Other Ambulatory Visit: Payer: Self-pay | Admitting: Cardiovascular Disease

## 2014-07-18 DIAGNOSIS — I480 Paroxysmal atrial fibrillation: Secondary | ICD-10-CM

## 2014-07-18 DIAGNOSIS — I350 Nonrheumatic aortic (valve) stenosis: Secondary | ICD-10-CM

## 2014-07-24 NOTE — H&P (Signed)
PATIENT NAME:  Dawn Cooper, Dawn Cooper MR#:  [id number] DATE OF BIRTH:  [birth year]  DATE OF ADMISSION:  05/19/2014  REFERRING PHYSICIAN: Larae Grooms, MD  PRIMARY CARE PHYSICIAN: Eduard Clos. Gilford Rile, MD  CHIEF COMPLAINT: Slurred speech.  HISTORY OF PRESENT ILLNESS: A 81 year old Caucasian female with a past medical history significant for systolic congestive heart failure, EF of 35% to 45% back in 2012; moderate aortic stenosis; as well as paroxysmal atrial fibrillation; presenting with slurred speech. Family notes that she had a mechanical fall about 3 to 4 days ago; however, had no further complaints from this. However, when they went to see her today, they noted that she was initially unresponsive, stating that she would not answer verbal commands. However, then she became more responsive as time went on and they noted her having slurred speech. This was around 6:30 p.m. on 05/19/2014. They decided to present to the hospital for further workup and evaluation. Symptoms have resolved at this time. No further symptomatology.  REVIEW OF SYSTEMS:  CONSTITUTIONAL: Denies fevers, chills, fatigue, weakness.  EYES: Denies blurred vision, double vision, or eye pain. EARS, NOSE, AND THROAT: Denies tinnitus, ear pain, hearing loss. RESPIRATORY: Denies cough, wheeze, shortness of breath.  CARDIOVASCULAR: Denies chest pain, palpitations, edema. GASTROINTESTINAL: Denies nausea, vomiting, diarrhea, or abdominal pain. GENITOURINARY: Denies dysuria or hematuria.  ENDOCRINE: Denies nocturia or thyroid problems. HEMATOLOGIC AND LYMPHATIC: Denies easy bruising or bleeding. SKIN: Denies rash or lesion.  MUSCULOSKELETAL: Denies pain in neck, back, shoulder, knees, hips or arthritic symptoms.  NEUROLOGIC: Denies paralysis, paresthesias currently. PSYCHIATRIC: Denies anxiety or depressive symptoms.  Otherwise, full review of systems performed by me is negative.   PAST MEDICAL HISTORY: Includes atrial fibrillation,  paroxysmal; systolic congestive heart failure, EF of 35% to 45%; hypertension, essential.   SOCIAL HISTORY: Has a cane for ambulation. No alcohol or tobacco use.   FAMILY HISTORY: No known cardiovascular or pulmonary disorders.   ALLERGIES: KNOWN TO PENICILLIN.   HOME MEDICATIONS: Include acetaminophen 325 mg 1 to 2 tablets every 4 hours as needed for pain or fever, lisinopril 40 mg p.o. q. daily, Imdur 30 mg p.o. q. daily, metoprolol 25 mg p.o. b.i.d., Lasix 20 mg p.o. every other day, potassium 10 mEq p.o. every other day, pantoprazole 40 mg p.o. q. daily, hydralazine 50 mg p.o. 3 times a day as needed.   PHYSICAL EXAMINATION:  VITAL SIGNS: Temperature 98.1, heart rate of 70, respirations 22, blood pressure 139/63, saturating 100% on room air. Weight 66.6 kg, BMI 27.8.  GENERAL: Frail-appearing Caucasian female; however, currently in no acute distress.  HEAD: Normocephalic, atraumatic.  EYES: Pupils equal, round, and reactive to light. Extraocular muscles intact. No scleral icterus.  MOUTH: Moist mucosal membranes. Dentition intact. No abscess noted. EARS, NOSE, THROAT: Clear without exudates. No external lesions.  NECK: Supple. No thyromegaly. No nodules. No JVD.  PULMONARY: Clear to auscultation bilaterally without wheezes, rales, or rhonchi. No use of accessory muscles. Good respiratory effort.  CHEST: Nontender to palpation.  CARDIOVASCULAR: S1, S2. Regular rate and rhythm with a 3/6 systolic ejection murmur best heard at right upper sternal border. Pedal pulses 2+. No edema.  GASTROINTESTINAL: Soft, nontender, nondistended. No masses. Positive bowel sounds. No hepatosplenomegaly.  MUSCULOSKELETAL: No swelling, clubbing, or edema. Range of motion full in all extremities.  NEUROLOGIC: Currently, cranial nerves II through XII are intact. No gross focal neurological deficits. Sensation intact. Reflexes intact. Strength 5/5 in all extremities including proximal and distal flexion and  extension. Pronator drift within normal limits.  SKIN: No ulceration, lesions, rashes, or cyanosis. Skin warm, dry. Turgor intact.  PSYCHIATRIC: Mood and affect within normal limits. Patient awake, alert, oriented x 3. Insight and judgment intact.   LABORATORY DATA: Sodium 142, potassium 4.5, chloride 108, bicarbonate 27, BUN 19, creatinine 1.52, glucose of 95. Albumin of 3, otherwise LFTs within normal limits. WBC of 6.1, hemoglobin of 10, platelets of 150,000.  IMAGING: Chest x-ray performed which reveals no acute cardiopulmonary process. X-ray of the pelvis reveals mildly displaced right obturator ring fracture. CT, head, performed reveals area of low attenuation in the right parietal lobe.   ASSESSMENT AND PLAN: A 81 year old Caucasian female with a history of systolic congestive heart failure and aortic stenosis and paroxysmal atrial fibrillation presenting with slurred speech and decreased mental status which have now all resolved. 1.  Cerebrovascular accident: Place on telemetry. Initiate aspirin and statin therapy. Check a transthoracic echocardiogram. Get an MRI, carotid Dopplers, lipid panel, hemoglobin A1c. Allow permissive hypertension, treating only blood pressure greater than 220/120. For symptomatic, use hydralazine at that time intravenous.  2.  Hypertension: Hold the majority of her medications. We will continue with Imdur; however, otherwise, once again, allow permissive hypertension, treating only greater than 220/120.  3.  Gastroesophageal reflux disease without esophagitis: Proton pump inhibitor therapy. 4.  Acute kidney injury: Gentle intravenous fluid hydration.  5.  Systolic congestive heart failure, not in acute exacerbation: Hold majority of medications for permissive hypertension. Continue with Imdur.  6.  Venous thromboembolism prophylaxis with sequential compression devices.  CODE STATUS: Patient is full code as discussed with family at bedside.   TIME SPENT: 45  minutes.    ____________________________ Aaron Mose. Lot Medford, MD dkh:ST D: 05/19/2014 23:10:55 ET T: 05/20/2014 00:40:04 ET JOB#: [id number]  cc: Aaron Mose. Jovonni Borquez, MD, <Dictator> Carter Kaman Woodfin Ganja MD ELECTRONICALLY SIGNED 05/20/2014 20:44

## 2014-07-24 NOTE — Consult Note (Signed)
General Aspect 81 year old Caucasian female with a past medical history significant for systolic congestive heart failure, EF of 35% to 45% back in 2012; moderate to severe aortic valve stenosis, paroxysmal atrial fibrillation, previous GI bleed on anticoagulation in 2012, presenting with slurred speech, CVA on MRI. cardiology was consulted for bradycardia overnight.   Family reports a mechanical fall about 3 to 4 days ago. Imaging suggests a pubic ramus fracture. Dawn Cooper she was noted to be initially unresponsive by family, stating that she would not answer verbal commands. However, then she became more responsive as time went on and they noted her having slurred speech. This was around 6:30 p.m. on 05/19/2014. They brought her to the hospital.  Overnight, she was lethargic, noted to have bradycardia, hypotension. She was given IVF boluses and started on infusion. BP improved. heart rate up from the 40s to the 50s. Today, heart rate in the 50s to 60s, lethargic, sleeping most of the day.  MRI showing possibly multiple strokes, and old stroke.   Present Illness . PAST MEDICAL HISTORY:  1. Atrial fibrillation with rapid ventricular response in 12/2010. 2. History of cardiomyopathy with ejection fraction of 35 to 45%. 3            moderate aortic stenosis.  4. History of acute respiratory failure due to congestive heart failure, left heart, systolic. 5. History of bilateral pleural effusions.  6. Cholecystectomy.  7. Appendectomy.  8. Admission for atrial fibrillation/rapid ventricular response, congestive heart failure exacerbation 12/2010.    ALLERGIES: Penicillin.   SOCIAL HISTORY:  Lives at home by herself. No smoking or alcohol abuse.   FAMILY HISTORY:  Cancer in the family. The patient's mother had bone cancer. The patient's father had lung cancer. The patient???s brother had pancreatic cancer.   Physical Exam:  GEN sleeping/lethargic   HEENT unable to test   NECK supple   No masses   RESP normal resp effort  clear BS   CARD Regular rate and rhythm  Normal, S1, S2  Murmur   Murmur Systolic   Systolic Murmur Out flow   ABD denies tenderness  soft   LYMPH negative neck   EXTR negative edema   SKIN normal to palpation   NEURO unable to test   [facility] stuporous   Review of Systems:  General: Weakness   ROS Pt not able to provide ROS   Medications/Allergies Reviewed Medications/Allergies reviewed        Admit Diagnosis:   CVA: Onset Date: 20-May-2014, Status: Active, Description: CVA  Home Medications: Medication Instructions Status  acetaminophen 325 mg oral tablet 1-2 tab(s) orally every 4 hours, As Needed - for Pain, for Fever  Active  hydrALAZINE 50 mg oral tablet 1 tab(s) orally 3 times a day, As Needed Active  isosorbide mononitrate 30 mg oral tablet, extended release 1 tab(s) orally once a day Active  metoprolol tartrate 25 mg oral tablet 1 tab(s) orally 2 times a day Active  potassium chloride 10 mEq oral tablet, extended release 1 tab(s) orally every other day Active  furosemide 20 mg oral tablet 1 tab(s) orally every other day Active  lisinopril 40 mg oral tablet 1 tab(s) orally once a day Active  pantoprazole 40 mg oral delayed release tablet 1 tab(s) orally once a day Active   Lab Results:  Routine Chem:  26-Feb-16 06:51   Cholesterol, Serum 143  Triglycerides, Serum 87  HDL (INHOUSE)  29  VLDL Cholesterol Calculated 17  LDL Cholesterol Calculated 97 (Result(s) reported  on 20 May 2014 at 07:31AM.)  Glucose, Serum 88  BUN 16  Creatinine (comp)  1.40  Sodium, Serum 145  Potassium, Serum 4.2  Chloride, Serum  113  CO2, Serum 25  Calcium (Total), Serum  8.3  Anion Gap 7  Osmolality (calc) 289  eGFR (African American)  45  eGFR (Non-African American)  37 (eGFR values <55mL/min/1.73 m2 may be an indication of chronic kidney disease (CKD). Calculated eGFR, using the MRDR Study equation, is useful in  patients with stable  renal function. The eGFR calculation will not be reliable in acutely ill patients when serum creatinine is changing rapidly. It is not useful in patients on dialysis. The eGFR calculation may not be applicable to patients at the low and high extremes of body sizes, pregnant women, and vegetarians.)  Routine Hem:  26-Feb-16 06:51   WBC (CBC) 4.4  RBC (CBC)  2.68  Hemoglobin (CBC)  8.2  Hematocrit (CBC)  25.3  Platelet Count (CBC)  132  MCV 94  MCH 30.5  MCHC 32.4  RDW 13.2  Neutrophil % 53.0  Lymphocyte % 32.7  Monocyte % 10.3  Eosinophil % 3.6  Basophil % 0.4  Neutrophil # 2.3  Lymphocyte # 1.4  Monocyte # 0.4  Eosinophil # 0.2  Basophil # 0.0 (Result(s) reported on 20 May 2014 at 07:16AM.)   EKG:  Interpretation EKG shows NSR with no significant ST or T wave changes   Radiology Results: [place]:    26-Feb-16 08:33, US Carotid Doppler Bilateral  US Carotid Doppler Bilateral   REASON FOR EXAM:    cva  COMMENTS:       PROCEDURE: [place]  - US CAROTID DOPPLER BILATERAL  - May 20 2014  8:33AM     CLINICAL DATA:  Acute stroke symptoms.    EXAM:  BILATERAL CAROTID DUPLEX ULTRASOUND    TECHNIQUE:  Pearline Cables scale imaging, color Doppler and duplex ultrasound were  performed of bilateral carotid and vertebral arteries in the neck.    COMPARISON:  05/20/2014  FINDINGS:  Criteria: Quantification of carotid stenosis is based on velocity  parameters that correlate the residual internal carotid diameter  with NASCET-based stenosis levels, using the diameter of the distal  internal carotid lumen as the denominator for stenosis measurement.    The following velocity measurements were obtained:    RIGHT    ICA:  144/29 cm/sec    CCA:  38/75IE/PPI    SYSTOLIC ICA/CCA RATIO:  1.8  DIASTOLIC ICA/CCA RATIO:  2.0    ECA:  123 cm/sec    LEFT    ICA:  220/55 cm/sec    CCA:  95/18 cm/sec    SYSTOLIC ICA/CCA RATIO:  2.5    DIASTOLIC ICA/CCA RATIO:  3.7    ECA:  114  cm/sec  RIGHT CAROTID ARTERY:Minor echogenic shadowing plaque formation. No  hemodynamically significant right ICA stenosis, velocity elevation,  or turbulent flow. Degree of narrowing less than 50%.    RIGHT VERTEBRAL ARTERY:  Antegrade    LEFT CAROTID ARTERY: Moderate diffuse left carotid system  heterogeneous plaque formation. Slight velocity elevation measuring  220/55 cm/sec. Minor luminal narrowing in the proximal ICA. Degree  of stenosis estimated at 50- 69%.    LEFT VERTEBRAL ARTERY:  Antegrade     IMPRESSION:  Moderate bilateral carotid atherosclerosis worse on the left.  Right ICA narrowing less than 50%.    Left ICA moderate stenosis estimated at 50-69%.      Electronically Signed    By:  M.  Shick M.D.    On: 05/20/2014 08:44         Verified By: Earl Gala, M.D.,  Cardiology:    26-Feb-16 07:16, Echo Doppler  Echo Doppler   REASON FOR EXAM:      COMMENTS:       PROCEDURE: [facility] [place] - ECHO DOPPLER COMPLETE(TRANSTHOR)  - May 20 2014  7:16AM     RESULT: Echocardiogram Report    Patient Name:   Dawn Cooper Date of Exam: 05/20/2014  Medical Rec #:  [id number]          Custom1:  Date of Birth:  08/22/1918       Height:  Patient Age:    53 years        Weight:  Patient Gender: F               BSA:    Indications: CVA  Sonographer:    Sherrie Sport RDCS  Referring Phys: Valentino Nose, K    Sonographer Comments: Technically challenging study due to less than   ideal echo windows.    Summary:   1. Left ventricular ejection fraction, by visual estimation, is 70 to   75%.   2. Normal global left ventricular systolic function.   3. Mild concentric left ventricular hypertrophy.   4. Mildly increased left ventricular septal thickness.   5. Moderate mitral valve regurgitation.   6. Mild aortic regurgitation.   7. Severe aortic valve stenosis.   8. Mildly elevated pulmonary artery systolic pressure.   9. Mild to moderate tricuspid regurgitation.  10. Mildly  increased left ventricular posterior wall thickness.  2D AND M-MODE MEASUREMENTS (normal ranges within parentheses):  Left Ventricle:          Normal  IVSd (2D):      1.68 cm (0.7-1.1)  LVPWd (2D):     1.09 cm (0.7-1.1) Aorta/LA:                 Normal  LVIDd (2D):     4.24 cm (3.4-5.7) Aortic Root (2D): 3.00 cm (2.4-3.7)  LVIDs (2D):     2.49 cm           Left Atrium (2D): 3.80 cm (1.9-4.0)  LV FS (2D):     41.3 %   (>25%)  LV EF (2D):     72.5 %   (>50%)                  Right Ventricle:                                    RVd (2D):        1.91 cm  LV DIASTOLIC FUNCTION:  MV Peak E: 1.37 m/s E/e' Ratio: 20.40  MV Peak A: 0.59 m/s Decel Time: 380 msec  E/A Ratio: 2.31  SPECTRAL DOPPLER ANALYSIS (where applicable):  Mitral Valve:  MV P1/2 Time: 110.20 msec  MV Area, PHT: 2.00 cm??  Aortic Valve: AoV Max Vel: 3.81 m/s AoV Peak PG: 58.2 mmHg AoV Mean PG:   38.0 mmHg  LVOT Vmax: 0.97 m/s LVOT VTI: 0.317 m LVOT Diameter: 2.00 cm  AoV Area, Vmax: 0.80 cm?? AoV Area, VTI: 0.86 cm?? AoV Area, Vmn: 0.78 cm??  Tricuspid Valve and PA/RV Systolic Pressure: TR Max Velocity: 3.00 m/s RA   Pressure: 5 mmHg RVSP/PASP: 41.0 mmHg  Pulmonic Valve:  PV Max Velocity: 1.25 m/s PV  Max PG: 6.2 mmHg PV Mean PG:    PHYSICIANINTERPRETATION:  Left Ventricle: The left ventricular internal cavity size was normal. LV   septal wall thickness was mildly increased. LV posterior wall thickness   was mildly increased. Mild concentric left ventricular hypertrophy.   Global LV systolic function was normal. Left ventricular ejection   fraction, by visual estimation, is 70 to 75%.  Right Ventricle: The right ventricular size is mildly enlarged.  Left Atrium: The left atrium is normal in size.  Mitral Valve: Moderate mitral valve regurgitation is seen.  Tricuspid Valve: Mild to moderate tricuspid regurgitation is visualized.   The tricuspid regurgitant velocity is 3.00 m/s, and with an assumed right   atrial pressure  of 5 mmHg, the estimated right ventricular systolic   pressure is mildly elevated at 41.0 mmHg.  Aortic Valve: Severe aortic stenosis is present. Mild aortic valve   regurgitation is seen.  Eden MD  Electronically signed by [id number] Bartholome Bill MD  Signature Date/Time: 05/20/2014/7:48:38 AM    *** Final ***    IMPRESSION: .        Verified By: Teodoro Spray, M.D., MD    Penicillin: Hives  Vital Signs/Nurse's Notes: **Vital Signs.:   26-Feb-16 11:29  Vital Signs Type Routine  Temperature Temperature (F) 97.7  Celsius 36.5  Temperature Source oral  Pulse Pulse 55  Systolic BP Systolic BP 219  Diastolic BP (mmHg) Diastolic BP (mmHg) 67  Mean BP 95  Pulse Ox % Pulse Ox % 96  Pulse Ox Activity Level  At rest  Oxygen Delivery 2L    Impression 81 year old Caucasian female with a past medical history significant for systolic congestive heart failure, EF of 35% to 45% back in 2012; moderate to severe aortic valve stenosis, paroxysmal atrial fibrillation, previous GI bleed on anticoagulation in 2012, presenting with slurred speech, CVA on MRI. cardiology was consulted for bradycardia overnight.  A/P: 1) CVA documented on MRI, possibly embolic possibly from underlying PAD/carotid disease, unable to exclude paroxysmal atrial fibrillation Currently in NSR would monitor on tele Not a good candidate for anticoagulation given hx of GI bleed, 2012 Could consider plavix daily once ok with neurology  2) Atrial fibrillation: currently maintingin NSR Would hold metoprolol given bradycardia Will monitor tele for arrhythmia  3) Bradycardia: suspect secondary to underlying CVA, will monitor no indiction for pacer at this time. BP improved with fluids, suspect some component of dehydration.  4) Aortic valve stenosis Moderate to severe in severity. outpt monitoring  5) Carotid artery stenosis left > right, moderate   Electronic Signatures: Ida Rogue (MD)   (Signed 26-Feb-16 18:33)  Authored: General Aspect/Present Illness, History and Physical Exam, Review of System, Health Issues, Home Medications, Labs, EKG , Radiology, Allergies, Vital Signs/Nurse's Notes, Impression/Plan   Last Updated: 26-Feb-16 18:33 by Ida Rogue (MD)

## 2014-07-24 NOTE — Consult Note (Signed)
PATIENT NAME:  Dawn Cooper, Dawn Cooper MR#:  [id number] DATE OF BIRTH:  [birth year]  DATE OF CONSULTATION:  05/20/2014  REFERRING PHYSICIAN:  Dr. Posey Pronto CONSULTING PHYSICIAN:  Pascal Lux, MD  REASON FOR CONSULTATION: I have been asked by Dr. Posey Pronto to evaluate this unfortunate woman for a possible right superior pubic ramus fracture.   HISTORY OF PRESENT ILLNESS: Briefly, she is a 81 year old female with multiple medical problems, including congestive heart failure, moderate aortic stenosis, paroxysmal atrial fibrillation, and hypertension. Apparently, she was in her usual state of health until she fell about 2 to 3 days ago. However, she was able to continue ambulation. Early yesterday, she was noted to be unresponsive and did not answer verbal communication. She was brought to the Emergency Room last evening because of her developing slurred speech and she was subsequently admitted for further work-up and evaluation. She is presently being worked up for these things and there is a concern that she may have had a stroke. During her work-up, she underwent an AP pelvis radiograph which showed a questionable nondisplaced fracture of the superior pubic ramus. The patient had sustained a fall 2 to 3 years ago in which she sustained a subtrochanteric/intertrochanteric fracture on the right which was treated with an intramedullary device and subsequently had gone on to heal well.  PHYSICAL EXAMINATION: We have a pleasant elderly female who is unresponsive at present. She is resting comfortably. Skin inspection of her right hip and pelvis is notable only for a well-healed incision laterally. She does not respond to verbal cues for ankle or toe dorsiflexion or plantar flexion, nor can sensation be assessed. However, she does respond to rubbing the bottom of her foot with some withdrawal motions. She has good capillary refill to her foot.   DIAGNOSTIC DATA: An AP pelvis x-ray is available for review. This film does  demonstrate a well-positioned trochanteric femoral nail with what appears to be good healing of the proximal femur fracture. There is some asymmetry of the suprapubic ramus on the right as compared to the left, although this is unclear as to whether this represents an acute fracture or not.   IMPRESSION: Possible superior pubic ramus fracture, right hip.   PLAN: The treatment options are discussed with the patient's family. At this point, her other medical issues certainly take precedence. However, when she is sufficiently cleared mentally and medically that she can begin to be mobilized with physical therapy, she may weight-bear as tolerated on this right side, using a cane or walker as necessary for support and safety. Given that she was able to ambulate for several days after her fall and prior to her apparent stroke, it does argue in favor that this fracture is certainly stable if not old.   Thank you for asking to participate in the care of this most unfortunate woman. We will be happy to follow her up in our office in approximately 1 month.   ____________________________ J. Dorien Chihuahua, MD jjp:sb D: 05/20/2014 13:00:23 ET T: 05/20/2014 16:21:13 ET JOB#: [id number]  cc: Pascal Lux, MD, <Dictator> Pascal Lux MD ELECTRONICALLY SIGNED 05/24/2014 11:44

## 2014-07-24 NOTE — Consult Note (Signed)
Chief Complaint:  Subjective/Chief Complaint Patient much more alert and responsive today. Notes some discomfort in right groin with ROM and when lifting leg, but able to get up to bedside commode this AM with only mild discomfort.   VITAL SIGNS/ANCILLARY NOTES: **Vital Signs.:   27-Feb-16 05:02  Temperature Temperature (F) 98.1  Pulse Pulse 66  Systolic BP Systolic BP 903  Diastolic BP (mmHg) Diastolic BP (mmHg) 63   Brief Assessment:  GEN Pleasant elderly female resting comfortably in bed   Respiratory normal resp effort   Additional Physical Exam Mild tenderness in right groin region Able to actively flex hip and leg with minimal pain NV intact to right LE   Assessment/Plan:  Assessment/Plan:  Assessment Non-displaced right superior pubic ramus fracture   Plan Continue to progress with PT WBAT on right LE with walker Will sign off now. May arrange f/u in 4 weeks in my office if clinically indicated. Thanks!   Electronic Signatures: Dorien Chihuahua (MD)  (Signed 27-Feb-16 09:10)  Authored: Chief Complaint, VITAL SIGNS/ANCILLARY NOTES, Brief Assessment, Assessment/Plan   Last Updated: 27-Feb-16 09:10 by Dorien Chihuahua (MD)

## 2014-07-24 NOTE — Consult Note (Signed)
PATIENT NAME:  Dawn Cooper, Dawn Cooper MR#:  [id number] DATE OF BIRTH:  [birth year]  DATE OF CONSULTATION:  05/21/2014  REFERRING PHYSICIAN:   CONSULTING PHYSICIAN:  Leotis Pain, MD  REASON FOR CONSULTATION:  Strokes.   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  Altered mental status yesterday that has been improved. The patient was never on any anticoagulation due to a history of gastrointestinal bleeds.   REVIEW OF SYSTEMS:  Currently denies any fever or chills. Denies any shortness of breath. Denies any chest pain. Denies any abdominal pain. Slight weakness on the right side compared to the left, but that has mostly resolved.   PAST MEDICAL HISTORY: Atrial fibrillation. Heart failure, ejection fraction 35% to 40%,  FAMILY HISTORY:  No known family history.   SOCIAL HISTORY:  Ambulates via cane.  No EtOH or tobacco use.  n HOME MEDICATIONS: Have been reviewed.   NEUROLOGIC: The patient is alert, awake and oriented to time, place, location. Able to tell me her date of birth. Speech appears to be fluent. No signs of dysarthria. No signs of aphasia. Tongue is midline. Uvula elevates symmetrically. Shoulder shrug intact. Motor 4+/5 bilateral upper and lower extremities. Sensation intact to light touch and temperature. Coordination: Finger-to-nose intact. Reflexes symmetrical. Gait not assessed.   IMPRESSION:  A 81 year old female with chronic atrial fibrillation, not on any anticoagulation due to history of gastrointestinal bleed, admitted status post fall, found to have a right pubic ramus fracture that is nondisplaced and needs conservative therapy. MRI shows likely embolic  strokes.   PLAN: Continue antiplatelet therapy. I do not think the patient is a good candidate for anticoagulation due to history of gastrointestinal bleed, and current age, with history of falls. Physical therapy, occupational therapy. No further imaging from a neurological standpoint. Please call with any questions.   Thank you, it was a pleasure seeing this patient.     ____________________________ Leotis Pain, MD yz:DT D: 05/21/2014 13:25:43 ET T: 05/21/2014 14:15:17 ET JOB#: [id number]  cc: Leotis Pain, MD, <Dictator> Leotis Pain MD ELECTRONICALLY SIGNED 06/09/2014 12:09

## 2014-07-24 NOTE — Consult Note (Signed)
Brief Consult Note: Diagnosis: Possible right superior pubic ramus fracture.   Patient was seen by consultant.   Consult note dictated.   Comments: Patient poorly responsive at this time, so medical management most important.  Once she is cleared medically and mentally, she can be mobilized with PT and weight-bear as tolerated on the right lower extremity, using a walker or cane as needed for balance and safety.  Thanks!.  Electronic Signatures: Dorien Chihuahua (MD)  (Signed [phone] 13:02)  Authored: Brief Consult Note   Last Updated: 26-Feb-16 13:02 by Dorien Chihuahua (MD)

## 2014-07-24 NOTE — Discharge Summary (Signed)
PATIENT NAME:  Dawn Cooper, Dawn Cooper MR#:  [id number] DATE OF BIRTH:  03-25-1919  DATE OF ADMISSION:  05/19/2014 DATE OF DISCHARGE:  05/23/2014   CHIEF COMPLAINT AT THE TIME OF ADMISSION: Slurred speech.  ADMITTING DIAGNOSES:  1. Acute cerebrovascular accident.  2. Hypertension. 3. Gastroesophageal reflux disease.  4. Acute kidney injury.  5. Systolic congestive heart failure.   DISCHARGE DIAGNOSES:  1. 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  2. Paroxysmal atrial fibrillation, in normal sinus rhythm.  3. Acute kidney injury, resolved.  4. Hypertension.  5. Systolic congestive heart failure, well compensated at this time.  6. Gastroesophageal reflux disease.  7. Right obturator ramus fracture.   PROCEDURES: No procedures were done.   IMAGING STUDIES:  1. Chest x-ray with no acute cardiopulmonary process.  2. X-ray of the pelvis: Mildly displaced right obturator ring fracture.  3. CT head: Area of low attenuation in the right parietal lobe. 4. MRI of the brain: This study was performed on 05/20/2014 and revealed a small acute infarction involving the medial thalamus and upper midbrain. Consideration is consistent with an artery of Percheron infarct, possible punctate acute right occipital cortical infarct; chronic right parieto-occipital infarct; mild chronic small vessel ischemic disease.  5. Carotid Dopplers performed on 05/20/2014: Moderate bilateral carotid atherosclerosis, worse on the left; right ICA narrowing, less than 50%; left ICA moderate stenosis, estimating 50% to 69%.  6. Echocardiogram performed on 05/20/2014: Left ventricular ejection fraction 70% to 75%, normal global left ventricular systolic function, mild aortic regurgitation, mildly increased left ventricular septal thickness.   CONSULTATIONS:  1. Orthopedics.  2. Neurology, Leotis Pain, MD. 3. Cardiology, Minna Merritts, MD.  [place]:  1. Acute CVA:  The patient is a 81 year old Caucasian female with history of significant systolic congestive heart failure with previous ejection fraction of 35%  to 45% in 2012 as well as paroxysmal atrial fibrillation, came into the ED with a chief complaint of slurred speech. Please review history and physical for details. The patient's CAT scan of the head revealed low attenuation in the right parietal lobe, which was concerning for stroke. The patient was started on aspirin and statin. MRA of the brain revealed stroke, which was concerning for embolic stroke given the history of paroxysmal atrial fibrillation. They allowed permissive hypertension. As patient had acute stroke, the patient was continued on aspirin and statin. Evaluated by speech pathology, no symptoms or signs of aspiration were noticed. Low-fat, low-salt diet was continued. The patient was evaluated by physical therapy who recommended skilled nursing. This was discussed with the patient as well as family members. The patient was at her baseline and eventually her speech was back to normal. The patient as well as family members were willing to send her to a skilled nursing facility for temporary rehabilitation. The patient was evaluated by cardiology, Dr. Rockey Situ, and neurology, Dr. Irish Elders. As she is elderly and high risk for falls, no anticoagulation was recommended at this time. As the patient has a history of GI bleed in the past, aspirin was switched to Plavix 75 mg to take p.o. once daily. The patient's LDL was at 96 and she was initiated on statin at 40 mg p.o. once daily. According to the family members, the patient is back to her baseline. The patient remained stable and decision was made to transfer the patient to a skilled nursing facility for rehabilitation.  2. Right obturator ramus fracture: The patient was evaluated by orthopedics. They recommended to  work with physical therapy and weightbearing as tolerated. The patient is doing fine and  working with physical therapy. Plan is to continue physical therapy at rehabilitation center.  3. Hypertension: Initially, permissive hypertension was allowed. Imdur, which is her home medication, was continued, but metoprolol and lisinopril were held. The patient's renal function is back to normal. We are resuming her home medications, metoprolol and lisinopril.  4. Acute kidney injury: We provided IV fluids and her renal function is back to normal.  5. Systolic congestive heart failure. The patient is not in acute exacerbation. Initially, her home medications were held but I am resuming Lasix along with potassium supplements today. Her echocardiogram revealed an ejection fraction of 75% which rules her out for systolic CHF and currently the patient is in diastolic congestive heart failure based on the echocardiogram results. She is to follow up with Dr. Rockey Situ in cardiology as an outpatient.  6. Paroxysmal atrial fibrillation: Currently rate controlled and normal sinus rhythm. The plan is to continue metoprolol. She is not a candidate for anticoagulation. 7. GERD: Continue her home medication, Protonix.   Hospital course is stable.  PHYSICAL EXAMINATION: VITAL SIGNS: Today, temperature 98.1, pulse 74, respirations 20, blood pressure 170/84, pulse oximetry 96% on room air.  GENERAL APPEARANCE: Not in any acute distress. Moderately built and nourished.  HEENT: Normocephalic, atraumatic. Pupils are equally reacting to light and accommodation. No scleral icterus. No conjunctival injection. No sinus tenderness. No postnasal drip. Moist mucous membranes.  NECK: Supple. No JVD. No thyromegaly. No masses. Range of motion is intact. LUNGS: Moderate air entry. No wheezing. No crackles. No rhonchi.  HEART: Regular rate and rhythm. Positive murmurs. No bruits.  ABDOMEN: Soft. Bowel sounds are positive in all 4 quadrants, nontender, nondistended. No masses. NEUROLOGIC: Awake, alert, oriented x 3, following  verbal commands. Cranial nerves II through XII are grossly intact. Motor and sensory are grossly intact reflexes are 2+.  EXTREMITIES: No cyanosis, no clubbing.  MEDICATIONS AT THE TIME OF DISCHARGE:  1. Lisinopril 40 mg p.o. once daily, which is her home medication.  2. Pantoprazole 40 mg p.o. once daily. 3. Tylenol 325 mg 1 to 2 tablets every 4 hours as needed for pain or fever.  4. Isosorbide mononitrate 30 mg p.o. once daily.  5. Metoprolol tartrate 25 mg p.o. b.i.d.  6. Potassium chloride 10 mEq p.o. every other day.  7. Lasix 20 mg 1 tablet p.o. every other day.  8. Atorvastatin 40 mg p.o. once daily.  9. Plavix 75 mg p.o. once daily.   DIET: Low sodium, low fat, regular consistency.   ACTIVITY: As tolerated and as recommended by physical therapy and orthopedics.   FOLLOWUP: Follow up with the PCP in 1 week; Neurology in 4 weeks;  and cardiology, Dr. Rockey Situ in 4 weeks.  Imaging studies as dictated above.   Glucose 110. BUN and creatinine are normal. Sodium 148, potassium is normal. GFR 45. Anion gap is normal. Serum osmolality 295. Calcium 8.2. WBC normal. Hemoglobin 8.5, hematocrit 25.9, platelets are at 129,000.   Urine culture and blood culture: No growth so far.   Influenza A and B are negative by PCR.   LDL 97, VLDL 17, total cholesterol 143, HDL 29, triglycerides 87.   The diagnosis and plan of care were discussed in detail with the patient and her daughter at bedside. They both verbalized understanding of the plan. The patient will be transferred to a skilled nursing facility either, either [facility] or [facility], as  soon as a bed is available.   She is in stable condition.   TIME SPENT: 45 minutes.    ____________________________ Nicholes Mango, MD ag:ah D: 05/23/2014 11:44:08 ET T: 05/23/2014 12:36:00 ET JOB#: [id number]  cc: Nicholes Mango, MD, <Dictator> Eduard Clos. Gilford Rile, MD Minna Merritts, MD Leotis Pain, MD J. Dorien Chihuahua, MD  Nicholes Mango MD ELECTRONICALLY SIGNED 05/26/2014 15:51

## 2014-07-25 ENCOUNTER — Encounter: Payer: Self-pay | Admitting: Internal Medicine

## 2014-07-25 ENCOUNTER — Ambulatory Visit (INDEPENDENT_AMBULATORY_CARE_PROVIDER_SITE_OTHER): Payer: Medicare Other | Admitting: Internal Medicine

## 2014-07-25 VITALS — BP 133/71 | HR 61 | Temp 97.8°F | Ht 60.0 in | Wt 140.5 lb

## 2014-07-25 DIAGNOSIS — IMO0001 Reserved for inherently not codable concepts without codable children: Secondary | ICD-10-CM | POA: Insufficient documentation

## 2014-07-25 DIAGNOSIS — N183 Chronic kidney disease, stage 3 unspecified: Secondary | ICD-10-CM

## 2014-07-25 DIAGNOSIS — I639 Cerebral infarction, unspecified: Secondary | ICD-10-CM

## 2014-07-25 DIAGNOSIS — M609 Myositis, unspecified: Secondary | ICD-10-CM

## 2014-07-25 DIAGNOSIS — N179 Acute kidney failure, unspecified: Secondary | ICD-10-CM | POA: Diagnosis not present

## 2014-07-25 DIAGNOSIS — M791 Myalgia: Secondary | ICD-10-CM

## 2014-07-25 DIAGNOSIS — I10 Essential (primary) hypertension: Secondary | ICD-10-CM

## 2014-07-25 NOTE — Assessment & Plan Note (Signed)
Recheck renal function with labs today.

## 2014-07-25 NOTE — Assessment & Plan Note (Signed)
Recent myalgia after taking Lipitor. Will stop Lipitor. Check CMP and CK with labs. Follow up if symptoms not improving.

## 2014-07-25 NOTE — Progress Notes (Signed)
Pre visit review using our clinic review tool, if applicable. No additional management support is needed unless otherwise documented below in the visit note. 

## 2014-07-25 NOTE — Assessment & Plan Note (Signed)
BP Readings from Last 3 Encounters:  07/25/14 133/71  06/22/14 155/69  06/16/14 93/64   BP well controlled. Renal function with labs today.

## 2014-07-25 NOTE — Patient Instructions (Addendum)
Labs today.  Stop Lipitor (Atorvastatin).  Follow up in 3 months.

## 2014-07-25 NOTE — Progress Notes (Signed)
   Subjective:    Patient ID: Dawn Cooper, female    DOB: 1918/04/22, 81 y.o.   MRN: [id number]  HPI 81YO female presents for follow up.  Acute renal failure - Continues on lower dose, 20mg  Lisinopril. Feeling better. More energy. Appetite has improved.  Myalgia - having calf pain and other muscular pain in arms and shoulders whenever she takes Lipitor.  Past medical, surgical, family and social history per today's encounter.  Review of Systems  Constitutional: Negative for fever, chills, appetite change, fatigue and unexpected weight change.  Eyes: Negative for visual disturbance.  Respiratory: Negative for shortness of breath.   Cardiovascular: Negative for chest pain and leg swelling.  Gastrointestinal: Negative for nausea, vomiting, abdominal pain, diarrhea and constipation.  Musculoskeletal: Positive for myalgias and arthralgias.  Skin: Negative for color change and rash.  Hematological: Negative for adenopathy. Does not bruise/bleed easily.  Psychiatric/Behavioral: Negative for sleep disturbance and dysphoric mood. The patient is not nervous/anxious.        Objective:    BP 133/71 mmHg  Pulse 61  Temp(Src) 97.8 F (36.6 C) (Oral)  Ht 5' (1.524 m)  Wt 140 lb 8 oz (63.73 kg)  BMI 27.44 kg/m2  SpO2 97% Physical Exam  Constitutional: She is oriented to person, place, and time. She appears well-developed and well-nourished. No distress.  HENT:  Head: Normocephalic and atraumatic.  Right Ear: External ear normal.  Left Ear: External ear normal.  Nose: Nose normal.  Mouth/Throat: Oropharynx is clear and moist.  Eyes: Conjunctivae are normal. Pupils are equal, round, and reactive to light. Right eye exhibits no discharge. Left eye exhibits no discharge. No scleral icterus.  Neck: Normal range of motion. Neck supple. No tracheal deviation present. No thyromegaly present.  Cardiovascular: Normal rate, regular rhythm and intact distal pulses.  Exam reveals no gallop and no  friction rub.   Murmur heard. Pulmonary/Chest: Effort normal and breath sounds normal. No respiratory distress. She has no wheezes. She has no rales. She exhibits no tenderness.  Musculoskeletal: Normal range of motion. She exhibits no edema or tenderness.  Lymphadenopathy:    She has no cervical adenopathy.  Neurological: She is alert and oriented to person, place, and time. No cranial nerve deficit. She exhibits normal muscle tone. Coordination normal.  Skin: Skin is warm and dry. No rash noted. She is not diaphoretic. No erythema. No pallor.  Psychiatric: She has a normal mood and affect. Her behavior is normal. Judgment and thought content normal.          Assessment & Plan:   Problem List Items Addressed This Visit      Unprioritized   Acute renal failure superimposed on stage 3 chronic kidney disease - Primary    Recheck renal function with labs today.      Relevant Orders   Comprehensive metabolic panel   HTN (hypertension)    BP Readings from Last 3 Encounters:  07/25/14 133/71  06/22/14 155/69  06/16/14 93/64   BP well controlled. Renal function with labs today.      Myalgia and myositis    Recent myalgia after taking Lipitor. Will stop Lipitor. Check CMP and CK with labs. Follow up if symptoms not improving.      Relevant Orders   Comprehensive metabolic panel   CK (Creatine Kinase)       Return in about 3 months (around 10/25/2014) for Recheck.

## 2014-07-26 ENCOUNTER — Encounter: Payer: Self-pay | Admitting: Internal Medicine

## 2014-07-26 LAB — COMPREHENSIVE METABOLIC PANEL
ALT: 13 U/L (ref 0–35)
AST: 21 U/L (ref 0–37)
Albumin: 3.4 g/dL — ABNORMAL LOW (ref 3.5–5.2)
Alkaline Phosphatase: 71 U/L (ref 39–117)
BUN: 17 mg/dL (ref 6–23)
CHLORIDE: 103 meq/L (ref 96–112)
CO2: 25 mEq/L (ref 19–32)
Calcium: 9.6 mg/dL (ref 8.4–10.5)
Creatinine, Ser: 1.56 mg/dL — ABNORMAL HIGH (ref 0.40–1.20)
GFR: 32.86 mL/min — AB (ref >60.00)
GLUCOSE: 104 mg/dL — AB (ref 70–99)
Potassium: 4.3 mEq/L (ref 3.5–5.1)
Sodium: 135 mEq/L (ref 135–145)
Total Bilirubin: 0.5 mg/dL (ref 0.2–1.2)
Total Protein: 6.9 g/dL (ref 6.0–8.3)

## 2014-07-26 LAB — CK: CK TOTAL: 38 U/L (ref 7–177)

## 2014-08-02 ENCOUNTER — Ambulatory Visit (INDEPENDENT_AMBULATORY_CARE_PROVIDER_SITE_OTHER): Payer: Medicare Other | Admitting: *Deleted

## 2014-08-02 DIAGNOSIS — E538 Deficiency of other specified B group vitamins: Secondary | ICD-10-CM | POA: Diagnosis not present

## 2014-08-02 MED ORDER — CYANOCOBALAMIN 1000 MCG/ML IJ SOLN
1000.0000 ug | Freq: Once | INTRAMUSCULAR | Status: AC
  Administered 2014-08-02: 1000 ug via INTRAMUSCULAR

## 2014-08-30 ENCOUNTER — Other Ambulatory Visit: Payer: Medicare Other

## 2014-09-06 ENCOUNTER — Ambulatory Visit: Payer: Medicare Other | Admitting: Internal Medicine

## 2014-09-06 ENCOUNTER — Ambulatory Visit (INDEPENDENT_AMBULATORY_CARE_PROVIDER_SITE_OTHER): Payer: Medicare Other

## 2014-09-06 DIAGNOSIS — E538 Deficiency of other specified B group vitamins: Secondary | ICD-10-CM

## 2014-09-06 MED ORDER — CYANOCOBALAMIN 1000 MCG/ML IJ SOLN
1000.0000 ug | Freq: Once | INTRAMUSCULAR | Status: AC
  Administered 2014-09-06: 1000 ug via INTRAMUSCULAR

## 2014-09-06 NOTE — Progress Notes (Signed)
Patient came in for monthly b12 injection.  Patient received injection in left deltoid per her request.  Patient tolerated well.

## 2014-09-09 ENCOUNTER — Inpatient Hospital Stay
Admission: EM | Admit: 2014-09-09 | Discharge: 2014-09-10 | DRG: 065 | Disposition: A | Discharge status: Home / Self Care | Payer: Medicare Other | Attending: Internal Medicine | Admitting: Internal Medicine

## 2014-09-09 ENCOUNTER — Emergency Department: Payer: Medicare Other

## 2014-09-09 ENCOUNTER — Encounter: Payer: Self-pay | Admitting: Intensive Care

## 2014-09-09 DIAGNOSIS — I4891 Unspecified atrial fibrillation: Secondary | ICD-10-CM | POA: Diagnosis present

## 2014-09-09 DIAGNOSIS — Z7902 Long term (current) use of antithrombotics/antiplatelets: Secondary | ICD-10-CM | POA: Diagnosis not present

## 2014-09-09 DIAGNOSIS — N183 Chronic kidney disease, stage 3 unspecified: Secondary | ICD-10-CM | POA: Diagnosis present

## 2014-09-09 DIAGNOSIS — E785 Hyperlipidemia, unspecified: Secondary | ICD-10-CM | POA: Diagnosis present

## 2014-09-09 DIAGNOSIS — Z880 Allergy status to penicillin: Secondary | ICD-10-CM | POA: Diagnosis not present

## 2014-09-09 DIAGNOSIS — Z9049 Acquired absence of other specified parts of digestive tract: Secondary | ICD-10-CM | POA: Diagnosis present

## 2014-09-09 DIAGNOSIS — Z800 Family history of malignant neoplasm of digestive organs: Secondary | ICD-10-CM

## 2014-09-09 DIAGNOSIS — I129 Hypertensive chronic kidney disease with stage 1 through stage 4 chronic kidney disease, or unspecified chronic kidney disease: Secondary | ICD-10-CM | POA: Diagnosis present

## 2014-09-09 DIAGNOSIS — I429 Cardiomyopathy, unspecified: Secondary | ICD-10-CM | POA: Diagnosis present

## 2014-09-09 DIAGNOSIS — Z79899 Other long term (current) drug therapy: Secondary | ICD-10-CM | POA: Diagnosis not present

## 2014-09-09 DIAGNOSIS — I340 Nonrheumatic mitral (valve) insufficiency: Secondary | ICD-10-CM | POA: Diagnosis present

## 2014-09-09 DIAGNOSIS — I639 Cerebral infarction, unspecified: Secondary | ICD-10-CM

## 2014-09-09 DIAGNOSIS — I071 Rheumatic tricuspid insufficiency: Secondary | ICD-10-CM | POA: Diagnosis present

## 2014-09-09 DIAGNOSIS — I350 Nonrheumatic aortic (valve) stenosis: Secondary | ICD-10-CM | POA: Diagnosis present

## 2014-09-09 DIAGNOSIS — Z9842 Cataract extraction status, left eye: Secondary | ICD-10-CM | POA: Diagnosis not present

## 2014-09-09 DIAGNOSIS — I6330 Cerebral infarction due to thrombosis of unspecified cerebral artery: Secondary | ICD-10-CM | POA: Diagnosis not present

## 2014-09-09 DIAGNOSIS — Z8673 Personal history of transient ischemic attack (TIA), and cerebral infarction without residual deficits: Secondary | ICD-10-CM | POA: Diagnosis not present

## 2014-09-09 DIAGNOSIS — I517 Cardiomegaly: Secondary | ICD-10-CM | POA: Diagnosis not present

## 2014-09-09 DIAGNOSIS — R4781 Slurred speech: Secondary | ICD-10-CM | POA: Diagnosis present

## 2014-09-09 DIAGNOSIS — I63512 Cerebral infarction due to unspecified occlusion or stenosis of left middle cerebral artery: Secondary | ICD-10-CM | POA: Diagnosis not present

## 2014-09-09 DIAGNOSIS — I638 Other cerebral infarction: Secondary | ICD-10-CM | POA: Diagnosis not present

## 2014-09-09 DIAGNOSIS — I10 Essential (primary) hypertension: Secondary | ICD-10-CM | POA: Diagnosis present

## 2014-09-09 DIAGNOSIS — Z833 Family history of diabetes mellitus: Secondary | ICD-10-CM

## 2014-09-09 DIAGNOSIS — Z823 Family history of stroke: Secondary | ICD-10-CM | POA: Diagnosis not present

## 2014-09-09 DIAGNOSIS — R29810 Facial weakness: Secondary | ICD-10-CM | POA: Diagnosis present

## 2014-09-09 DIAGNOSIS — I482 Chronic atrial fibrillation: Secondary | ICD-10-CM | POA: Diagnosis not present

## 2014-09-09 DIAGNOSIS — R471 Dysarthria and anarthria: Secondary | ICD-10-CM | POA: Diagnosis not present

## 2014-09-09 DIAGNOSIS — K219 Gastro-esophageal reflux disease without esophagitis: Secondary | ICD-10-CM | POA: Diagnosis present

## 2014-09-09 DIAGNOSIS — Z888 Allergy status to other drugs, medicaments and biological substances status: Secondary | ICD-10-CM | POA: Diagnosis not present

## 2014-09-09 DIAGNOSIS — Z66 Do not resuscitate: Secondary | ICD-10-CM | POA: Diagnosis present

## 2014-09-09 HISTORY — DX: Cardiac murmur, unspecified: R01.1

## 2014-09-09 HISTORY — DX: Cerebral infarction, unspecified: I63.9

## 2014-09-09 HISTORY — DX: Chronic kidney disease, unspecified: N18.9

## 2014-09-09 LAB — CBC
HCT: 33.2 % — ABNORMAL LOW (ref 35.0–47.0)
Hemoglobin: 11.1 g/dL — ABNORMAL LOW (ref 12.0–16.0)
MCH: 30.7 pg (ref 26.0–34.0)
MCHC: 33.6 g/dL (ref 32.0–36.0)
MCV: 91.4 fL (ref 80.0–100.0)
PLATELETS: 183 10*3/uL (ref 150–440)
RBC: 3.63 MIL/uL — ABNORMAL LOW (ref 3.80–5.20)
RDW: 14.7 % — AB (ref 11.5–14.5)
WBC: 5.7 10*3/uL (ref 3.6–11.0)

## 2014-09-09 LAB — URINALYSIS COMPLETE WITH MICROSCOPIC (ARMC ONLY)
Bilirubin Urine: NEGATIVE
Glucose, UA: NEGATIVE mg/dL
Ketones, ur: NEGATIVE mg/dL
NITRITE: NEGATIVE
PROTEIN: NEGATIVE mg/dL
Specific Gravity, Urine: 1.005 (ref 1.005–1.030)
pH: 5 (ref 5.0–8.0)

## 2014-09-09 LAB — COMPREHENSIVE METABOLIC PANEL
ALK PHOS: 52 U/L (ref 38–126)
ALT: 10 U/L — AB (ref 14–54)
ANION GAP: 7 (ref 5–15)
AST: 23 U/L (ref 15–41)
Albumin: 3.4 g/dL — ABNORMAL LOW (ref 3.5–5.0)
BILIRUBIN TOTAL: 0.6 mg/dL (ref 0.3–1.2)
BUN: 16 mg/dL (ref 6–20)
CHLORIDE: 106 mmol/L (ref 101–111)
CO2: 26 mmol/L (ref 22–32)
Calcium: 9.4 mg/dL (ref 8.9–10.3)
Creatinine, Ser: 1.56 mg/dL — ABNORMAL HIGH (ref 0.44–1.00)
GFR calc non Af Amer: 27 mL/min — ABNORMAL LOW (ref >60)
GFR, EST AFRICAN AMERICAN: 32 mL/min — AB (ref >60)
Glucose, Bld: 119 mg/dL — ABNORMAL HIGH (ref 65–99)
POTASSIUM: 3.9 mmol/L (ref 3.5–5.1)
SODIUM: 139 mmol/L (ref 135–145)
Total Protein: 6.6 g/dL (ref 6.5–8.1)

## 2014-09-09 LAB — ETHANOL: Alcohol, Ethyl (B): <5 mg/dL (ref <5)

## 2014-09-09 LAB — DIFFERENTIAL
BASOS PCT: 1 %
Basophils Absolute: 0 10*3/uL (ref 0–0.1)
EOS PCT: 4 %
Eosinophils Absolute: 0.2 10*3/uL (ref 0–0.7)
LYMPHS ABS: 2.3 10*3/uL (ref 1.0–3.6)
Lymphocytes Relative: 40 %
MONOS PCT: 9 %
Monocytes Absolute: 0.5 10*3/uL (ref 0.2–0.9)
Neutro Abs: 2.6 10*3/uL (ref 1.4–6.5)
Neutrophils Relative %: 46 %

## 2014-09-09 LAB — PROTIME-INR
INR: 1.04
PROTHROMBIN TIME: 13.8 s (ref 11.4–15.0)

## 2014-09-09 LAB — TROPONIN I

## 2014-09-09 LAB — APTT: aPTT: 34 seconds (ref 24–36)

## 2014-09-09 MED ORDER — SENNOSIDES-DOCUSATE SODIUM 8.6-50 MG PO TABS: 1.0000 | ORAL_TABLET | Freq: Every evening | ORAL | Status: DC | PRN

## 2014-09-09 MED ORDER — PANTOPRAZOLE SODIUM 40 MG PO TBEC
40.0000 mg | DELAYED_RELEASE_TABLET | Freq: Every day | ORAL | Status: DC
  Administered 2014-09-10: 40 mg via ORAL
  Filled 2014-09-09 (×2): qty 1

## 2014-09-09 MED ORDER — ACETAMINOPHEN 325 MG PO TABS: 650.0000 mg | ORAL_TABLET | ORAL | Status: DC | PRN

## 2014-09-09 MED ORDER — CLOPIDOGREL BISULFATE 75 MG PO TABS
75.0000 mg | ORAL_TABLET | Freq: Every day | ORAL | Status: DC
  Administered 2014-09-10: 75 mg via ORAL
  Filled 2014-09-09 (×2): qty 1

## 2014-09-09 MED ORDER — ACETAMINOPHEN 650 MG RE SUPP: 650.0000 mg | RECTAL | Status: DC | PRN

## 2014-09-09 MED ORDER — STROKE: EARLY STAGES OF RECOVERY BOOK
Freq: Once | Status: AC
  Administered 2014-09-09: 21:00:00

## 2014-09-09 MED ORDER — LISINOPRIL 20 MG PO TABS
20.0000 mg | ORAL_TABLET | Freq: Every day | ORAL | Status: DC
  Administered 2014-09-10: 20 mg via ORAL
  Filled 2014-09-09 (×2): qty 1

## 2014-09-09 MED ORDER — METOPROLOL TARTRATE 25 MG PO TABS
25.0000 mg | ORAL_TABLET | Freq: Two times a day (BID) | ORAL | Status: DC
  Administered 2014-09-09 – 2014-09-10 (×2): 25 mg via ORAL
  Filled 2014-09-09 (×2): qty 1

## 2014-09-09 MED ORDER — ASPIRIN 81 MG PO CHEW
CHEWABLE_TABLET | ORAL | Status: AC
  Administered 2014-09-09: 324 mg via ORAL
  Filled 2014-09-09: qty 4

## 2014-09-09 MED ORDER — ISOSORBIDE MONONITRATE ER 30 MG PO TB24
30.0000 mg | ORAL_TABLET | Freq: Every day | ORAL | Status: DC
  Administered 2014-09-10: 30 mg via ORAL
  Filled 2014-09-09 (×2): qty 1

## 2014-09-09 MED ORDER — HEPARIN SODIUM (PORCINE) 5000 UNIT/ML IJ SOLN
5000.0000 [IU] | Freq: Three times a day (TID) | INTRAMUSCULAR | Status: DC
  Administered 2014-09-09 – 2014-09-10 (×3): 5000 [IU] via SUBCUTANEOUS
  Filled 2014-09-09 (×3): qty 1

## 2014-09-09 MED ORDER — ASPIRIN 81 MG PO CHEW
324.0000 mg | CHEWABLE_TABLET | Freq: Once | ORAL | Status: AC
  Administered 2014-09-09: 324 mg via ORAL

## 2014-09-09 NOTE — ED Provider Notes (Signed)
[facility] Emergency Department Provider Note  ____________________________________________  Time seen: 3:35 PM  I have reviewed the triage vital signs and the nursing notes.   HISTORY  Chief Complaint Stroke Symptoms  History obtained from patient and family  HPI Dawn Cooper is a 81 y.o. female with a history of A. fib and hypertension and previous stroke who is complaining of confusion, dysarthria, blurry vision, and loss of balance on feet since 8 AM this morning. Family report that she had a stroke in February that cause vision changes and difficulty speaking, but does neurologic deficits have resolved. She was initially put on anticoagulants after the stroke, but then had a GI bleed and was therefore taken off of all this things. She currently only takes Plavix for stroke prevention. She has been compliant with her medications. No recent illness. No abdominal pain nausea vomiting, chest pain shortness of breath, falls or trauma.     Past Medical History  Diagnosis Date  . Hypertension   . GERD (gastroesophageal reflux disease)   . A-fib   . Mild mitral regurgitation by prior echocardiogram   . Tricuspid regurgitation   . Cardiomyopathy   . Moderate aortic stenosis   . Hiatal hernia   . Broken leg     right    Patient Active Problem List   Diagnosis Date Noted  . Myalgia and myositis 07/25/2014  . Acute renal failure superimposed on stage 3 chronic kidney disease 06/22/2014  . CKD (chronic kidney disease), stage III 06/16/2014  . Lightheaded 06/16/2014  . Counseling regarding end of life decision making 06/16/2014  . CVA (cerebral vascular accident) 06/02/2014  . Aortic valve stenosis, severe 05/05/2014  . Lightheadedness 03/22/2014  . Loss of weight 03/22/2014  . Anemia 12/17/2013  . Medicare annual wellness visit, subsequent 06/14/2013  . Gait instability 10/23/2011  . Hypokalemia 07/18/2011  . GERD (gastroesophageal reflux disease)  07/18/2011  . Pernicious anemia 03/13/2011  . Arthralgia 01/15/2011  . Atrial fibrillation 01/14/2011  . Edema 01/14/2011  . HTN (hypertension) 01/14/2011    Past Surgical History  Procedure Laterality Date  . Appendectomy    . Gallbladder surgery    . Cholecystectomy    . Vaginal delivery    . Cataract extraction  2013    left  . Femur fracture surgery  12/2011    Dr. Sabra Heck, s/p rehab at [place] [facility]  Name  Route  Sig  Dispense  Refill  . clopidogrel (PLAVIX) 75 MG tablet   Oral   Take 1 tablet (75 mg total) by mouth daily.   90 tablet   3   . furosemide (LASIX) 20 MG tablet   Oral   Take 1 tablet (20 mg total) by mouth every other day.   45 tablet   3   . isosorbide mononitrate (IMDUR) 30 MG 24 hr tablet   Oral   Take 1 tablet (30 mg total) by mouth daily.   90 tablet   4     Dispense as written.   Marland Kitchen lisinopril (PRINIVIL,ZESTRIL) 40 MG tablet   Oral   Take 0.5 tablets (20 mg total) by mouth daily.   90 tablet   1   . metoprolol tartrate (LOPRESSOR) 25 MG tablet   Oral   Take 25 mg by mouth 2 (two) times daily.         . pantoprazole (PROTONIX) 40 MG tablet   Oral   Take 40 mg by mouth daily.         Marland Kitchen  potassium chloride (K-DUR,KLOR-CON) 10 MEQ tablet   Oral   Take 1 tablet (10 mEq total) by mouth every other day.   45 tablet   1     Allergies Pradaxa; Clonidine derivatives; and Penicillins  Family History  Problem Relation Age of Onset  . Stroke Sister   . Cancer Brother     pancreatic  . Cancer Sister     pancreatic  . Diabetes    . Heart attack Neg Hx   . Diabetes Brother     Social History History  Substance Use Topics  . Smoking status: Never Smoker   . Smokeless tobacco: Never Used  . Alcohol Use: No    Review of Systems  Constitutional: No fever or chills. No weight changes Eyes:No blurry vision or double vision.  ENT: No sore throat. Cardiovascular: No chest pain. Respiratory: No dyspnea or  cough. Gastrointestinal: Negative for abdominal pain, vomiting and diarrhea.  No BRBPR or melena. Genitourinary: Negative for dysuria, urinary retention, bloody urine, or difficulty urinating. Musculoskeletal: Negative for back pain. No joint swelling or pain. Skin: Negative for rash. Neurological: Difficulty walking. Psychiatric:No anxiety or depression.   Endocrine:No hot/cold intolerance, changes in energy, or sleep difficulty.  10-point ROS otherwise negative.  ____________________________________________   PHYSICAL EXAM:  VITAL SIGNS: ED Triage Vitals  Enc Vitals Group     BP 09/09/14 1541 157/78 mmHg     Pulse Rate 09/09/14 1541 69     Resp 09/09/14 1541 21     Temp 09/09/14 1541 98.7 F (37.1 C)     Temp Source 09/09/14 1541 Oral     SpO2 09/09/14 1541 99 %     Weight 09/09/14 1538 138 lb (62.596 kg)     Height --      Head Cir --      Peak Flow --      Pain Score --      Pain Loc --      Pain Edu? --      Excl. in [place]? --      Constitutional: Alert, confused. Responds to name, but unable to state name date or location. Well appearing and in no distress. Eyes: No scleral icterus. No conjunctival pallor. PERRL. EOMI ENT   Head: Normocephalic and atraumatic.   Nose: No congestion/rhinnorhea. No septal hematoma   Mouth/Throat: MMM, no pharyngeal erythema. No peritonsillar mass. No uvula shift.   Neck: No stridor. No SubQ emphysema. No meningismus. Hematological/Lymphatic/Immunilogical: No cervical lymphadenopathy. Cardiovascular: Irregularly irregular rhythm. Strong right sternal border murmur radiating to the carotid with a high pitch. Normal and symmetric distal pulses are present in all extremities.  Respiratory: Normal respiratory effort without tachypnea nor retractions. Breath sounds are clear and equal bilaterally. No wheezes/rales/rhonchi. Gastrointestinal: Soft and nontender. No distention. There is no CVA tenderness.  No rebound, rigidity, or  guarding. Genitourinary: deferred Musculoskeletal: Nontender with normal range of motion in all extremities. No joint effusions.  No lower extremity tenderness.  No edema. Neurologic:   Garbled speech Cranial 2-12 nerves intact . No pronator drift, normal finger to nose Large muscle group strength symmetric and intact in arms and legs Gait ataxia with falling to the left Skin:  Skin is warm, dry and intact. No rash noted.  No petechiae, purpura, or bullae. Psychiatric: Mood and affect are normal. Speech and behavior are normal. Patient exhibits appropriate insight and judgment.  ____________________________________________    LABS (pertinent positives/negatives) (all labs ordered are listed, but only abnormal results are displayed)  Labs Reviewed  CBC - Abnormal; Notable for the following:    RBC 3.63 (*)    Hemoglobin 11.1 (*)    HCT 33.2 (*)    RDW 14.7 (*)    All other components within normal limits  COMPREHENSIVE METABOLIC PANEL - Abnormal; Notable for the following:    Glucose, Bld 119 (*)    Creatinine, Ser 1.56 (*)    Albumin 3.4 (*)    ALT 10 (*)    GFR calc non Af Amer 27 (*)    GFR calc Af Amer 32 (*)    All other components within normal limits  URINALYSIS COMPLETEWITH MICROSCOPIC ([facility] ONLY) - Abnormal; Notable for the following:    Color, Urine STRAW (*)    APPearance CLEAR (*)    Hgb urine dipstick 1+ (*)    Leukocytes, UA TRACE (*)    Bacteria, UA RARE (*)    Squamous Epithelial / LPF 0-5 (*)    All other components within normal limits  ETHANOL  PROTIME-INR  APTT  DIFFERENTIAL  TROPONIN I   ____________________________________________   EKG  Interpreted by me Atrial fibrillation rate of 62, normal axis and intervals, left bundle branch block, no acute ischemic changes in ST segments or T waves  ____________________________________________    RADIOLOGY  CT head no acute  pathology  ____________________________________________   PROCEDURES  ____________________________________________   INITIAL IMPRESSION / ASSESSMENT AND PLAN / ED COURSE  Pertinent labs & imaging results that were available during my care of the patient were reviewed by me and considered in my medical decision making (see chart for details).  Patient presents with ischemic stroke symptoms. Workup overall unremarkable. We'll give single dose aspirin for now due to her acute symptoms and admitted to hospitalist services. Discussed with hospitalist Dr. Volanda Napoleon at 5:30 PM  ____________________________________________   FINAL CLINICAL IMPRESSION(S) / ED DIAGNOSES  Final diagnoses:  Stroke, acute, thrombotic      Carrie Mew, MD 09/09/14 1735

## 2014-09-09 NOTE — H&P (Signed)
[place] [facility] [place] NAME: Dawn Cooper    MR#:  [id number]  DATE OF BIRTH:  1918/11/20  DATE OF ADMISSION:  09/09/2014  PRIMARY CARE PHYSICIAN: Rica Mast, MD   REQUESTING/REFERRING PHYSICIAN: Dr Joni Fears   CHIEF COMPLAINT:   Chief Complaint  Patient presents with  . Stroke Symptoms    HISTORY OF PRESENT ILLNESS:  Dawn Cooper  is a 81 y.o. female with a known history of CVA in February 2016 with no residual defects, hypertension, chronic kidney disease stage III, severe aortic stenosis, atrial fibrillation and hypertension presents today with slurred speech and ataxia starting possibly yesterday, but definitely present upon waking this morning. History is provided by her daughter who lives with her. She reports that her current symptoms are very similar to those she experienced in February with the last CVA. She has been in her usual state of health until yesterday. She is generally cognitively intact, uses a walker for ambulation. Last evening and again this her speech has been slurred, she has been frustrated at not being able to express herself, she has a slight left-sided facial droop and has been staggering around the house. No falls. No chest pain, palpitations, confusion. He is being admitted for further evaluation of probable CVA. She does have history of atrial fibrillation and had been on Pradaxa in the past which was stopped due to GI bleeding. Since her last hospitalization in February 2016 she has been on Plavix. Denies any missed medications.  PAST MEDICAL HISTORY:   Past Medical History  Diagnosis Date  . Hypertension   . GERD (gastroesophageal reflux disease)   . A-fib   . Mild mitral regurgitation by prior echocardiogram   . Tricuspid regurgitation   . Cardiomyopathy   . Moderate aortic stenosis   . Hiatal hernia   . Broken leg     right  . Stroke   . Heart murmur   . Chronic kidney disease      PAST SURGICAL HISTORY:   Past Surgical History  Procedure Laterality Date  . Appendectomy    . Gallbladder surgery    . Cholecystectomy    . Vaginal delivery    . Cataract extraction  2013    left  . Femur fracture surgery  12/2011    Dr. Sabra Heck, s/p rehab at [place]:   History  Substance Use Topics  . Smoking status: Never Smoker   . Smokeless tobacco: Never Used  . Alcohol Use: No   Currently lives with her daughter. Uses a walker for ambulation. Cognitively intact. Does not drive.  FAMILY HISTORY:   Family History  Problem Relation Age of Onset  . Stroke Sister   . Cancer Brother     pancreatic  . Cancer Sister     pancreatic  . Diabetes    . Heart attack Neg Hx   . Diabetes Brother     DRUG ALLERGIES:   Allergies  Allergen Reactions  . Pradaxa [Dabigatran Etexilate Mesylate] Other (See Comments)    Reaction:  Bleeding   . Clonidine Derivatives Itching and Rash  . Penicillins Itching and Rash    REVIEW OF SYSTEMS:   Review of Systems  Constitutional: Negative for fever, chills, weight loss and malaise/fatigue.  HENT: Negative for congestion and hearing loss.   Eyes: Negative for blurred vision, double vision and pain.  Respiratory: Negative for cough, hemoptysis, sputum production, shortness of breath and stridor.  Cardiovascular: Negative for chest pain, palpitations, orthopnea and leg swelling.  Gastrointestinal: Negative for nausea, vomiting, abdominal pain, diarrhea, constipation and blood in stool.  Genitourinary: Negative for dysuria and frequency.  Musculoskeletal: Negative for myalgias, back pain, joint pain and neck pain.  Skin: Negative for rash.  Neurological: Positive for speech change. Negative for sensory change, focal weakness, seizures, loss of consciousness and headaches.  Endo/Heme/Allergies: Does not bruise/bleed easily.  Psychiatric/Behavioral: Negative for depression and hallucinations. The patient is not  nervous/anxious.     MEDICATIONS AT HOME:   Prior to Admission medications   Medication Sig Start Date End Date Taking? Authorizing Provider  clopidogrel (PLAVIX) 75 MG tablet Take 1 tablet (75 mg total) by mouth daily. 06/16/14  Yes Jackolyn Confer, MD  furosemide (LASIX) 20 MG tablet Take 1 tablet (20 mg total) by mouth every other day. 12/17/13  Yes Jackolyn Confer, MD  isosorbide mononitrate (IMDUR) 30 MG 24 hr tablet Take 1 tablet (30 mg total) by mouth daily. 11/05/13  Yes Minna Merritts, MD  lisinopril (PRINIVIL,ZESTRIL) 40 MG tablet Take 0.5 tablets (20 mg total) by mouth daily. 07/01/14  Yes Jackolyn Confer, MD  metoprolol tartrate (LOPRESSOR) 25 MG tablet Take 25 mg by mouth 2 (two) times daily.   Yes Historical Provider, MD  pantoprazole (PROTONIX) 40 MG tablet Take 40 mg by mouth daily.   Yes Historical Provider, MD  potassium chloride (K-DUR,KLOR-CON) 10 MEQ tablet Take 1 tablet (10 mEq total) by mouth every other day. 12/08/12  Yes Jackolyn Confer, MD      VITAL SIGNS:  Blood pressure 161/97, pulse 68, temperature 98.7 F (37.1 C), temperature source Oral, resp. rate 16, weight 62.596 kg (138 lb), SpO2 96 %.  PHYSICAL EXAMINATION:  GENERAL:  81 y.o.-year-old patient lying in the bed with no acute distress.  EYES: Pupils equal, round, reactive to light and accommodation. No scleral icterus. Extraocular muscles intact.  HEENT: Head atraumatic, normocephalic. Oropharynx and nasopharynx clear. Oral mucous membranes are pink and moist NECK:  Supple, no jugular venous distention. No thyroid enlargement, no tenderness.  LUNGS: Normal breath sounds bilaterally, no wheezing, rales,rhonchi or crepitation. No use of accessory muscles of respiration.  CARDIOVASCULAR: 5/6 systolic ejection murmur high-pitched, no rubs, or gallops. Irregularly irregular, rate controlled ABDOMEN: Soft, nontender, nondistended. Bowel sounds present. No organomegaly or mass.  EXTREMITIES: No pedal  edema, cyanosis, or clubbing.  NEUROLOGIC: Extraocular motion intact, vision seems to be intact, she does have a left sided facial droop most pronounced at the corner of the mouth, tongue deviates to the right on extension, sensation is intact, strength in the extremities 5 out of 5 throughout PSYCHIATRIC: The patient is alert and oriented x 3.  SKIN: No obvious rash, lesion, or ulcer.   LABORATORY PANEL:   CBC  Recent Labs Lab 09/09/14 1539  WBC 5.7  HGB 11.1*  HCT 33.2*  PLT 183   ------------------------------------------------------------------------------------------------------------------  Chemistries   Recent Labs Lab 09/09/14 1539  NA 139  K 3.9  CL 106  CO2 26  GLUCOSE 119*  BUN 16  CREATININE 1.56*  CALCIUM 9.4  AST 23  ALT 10*  ALKPHOS 52  BILITOT 0.6   ------------------------------------------------------------------------------------------------------------------  Cardiac Enzymes  Recent Labs Lab 09/09/14 1539  TROPONINI <0.03   ------------------------------------------------------------------------------------------------------------------  RADIOLOGY:  Ct Head Wo Contrast  09/09/2014   CLINICAL DATA:  Slurred speech  EXAM: CT HEAD WITHOUT CONTRAST  TECHNIQUE: Contiguous axial images were obtained from the base of the  skull through the vertex without intravenous contrast.  COMPARISON:  05/20/2014  FINDINGS: The bony calvarium is intact. Diffuse atrophic and chronic white matter ischemic changes are noted. Findings of prior infarct in the posterior right parietal lobe are seen and stable no acute hemorrhage, acute infarction or space-occupying mass lesion is identified. Lacunar infarct is noted within the left thalamus stable from the prior exam.  IMPRESSION: Chronic atrophic and ischemic changes without acute abnormality.   Electronically Signed   By: Inez Catalina M.D.   On: 09/09/2014 16:40   Dg Chest Portable 1 View  09/09/2014   CLINICAL DATA:   Slurred speech, altered mental status  EXAM: PORTABLE CHEST - 1 VIEW  COMPARISON:  05/19/2014  FINDINGS: Moderate enlargement of the cardiomediastinal silhouette noted with aortic unfolding but no evidence for edema. Linear left lower lobe scarring is again noted. No pleural effusion. Right lung is clear. No acute osseous abnormality.  IMPRESSION: Moderate apparent cardiomegaly without focal acute finding.   Electronically Signed   By: Conchita Paris M.D.   On: 09/09/2014 16:28    EKG:   Orders placed or performed during the hospital encounter of 09/09/14  . ED EKG  . ED EKG    IMPRESSION AND PLAN:   Principal Problem:   CVA (cerebral infarction) Active Problems:   Atrial fibrillation   HTN (hypertension)   Aortic valve stenosis, severe   CKD (chronic kidney disease), stage III   #1 CVA: This is the likely explanation for her current symptoms. CT scan shows no acute changes. Will obtain an MRI/MRA. Will obtain speech, occupational and physical therapy consultations. She has passed the bedside swallow screen but still has weakness of facial muscles. She is already on Plavix, has not been taking aspirin. She has not been taking a statin due to myalgias. At her last admission she had a 2-D echocardiogram, carotid Dopplers I will not repeat these studies. Monitor on telemetry. I will consult neurology for recommendations regarding medications to decrease risk of future CVA may benefit from aspirin.  #2 atrial fibrillation: Not on anticoagulation due to history of GI bleed with Protonix. Rate currently controlled. Continue metoprolol  #3 hypertension: Blood pressure slightly elevated. We'll allow for permissive hypertension in the setting of acute CVA. Continue metoprolol and lisinopril.  #4 severe aortic stenosis: No syncope. This is not likely contributing to her current symptoms. She is not a surgical candidate.  #5 chronic kidney disease stage III: Stable  All the records are reviewed  and case discussed with ED provider. Management plans discussed with the patient, family and they are in agreement.  CODE STATUS: DO NOT RESUSCITATE  TOTAL TIME TAKING CARE OF THIS PATIENT: 50 minutes.    Myrtis Ser M.D on 09/09/2014 at 6:20 PM  Between 7am to 6pm - Pager - [phone]  After 6pm go to [email or web address] - password [email or web address] [facility]  Office  [phone]  CC: Primary care physician; Rica Mast, MD

## 2014-09-09 NOTE — ED Notes (Signed)
Pt arrived by EMS from home. Pts family states she had slurred speech at 0750 this morning. PT had a ischemic stroke this past February with symptoms of slurred speech, balance issues, and blurry vision. Pt has HX of A fib

## 2014-09-10 ENCOUNTER — Inpatient Hospital Stay: Payer: Medicare Other

## 2014-09-10 LAB — HEMOGLOBIN A1C: Hgb A1c MFr Bld: 5.5 % (ref 4.0–6.0)

## 2014-09-10 LAB — LIPID PANEL
Cholesterol: 168 mg/dL (ref 0–200)
HDL: 30 mg/dL — AB (ref >40)
LDL CALC: 120 mg/dL — AB (ref 0–99)
Total CHOL/HDL Ratio: 5.6 RATIO
Triglycerides: 90 mg/dL (ref <150)
VLDL: 18 mg/dL (ref 0–40)

## 2014-09-10 MED ORDER — ASPIRIN EC 81 MG PO TBEC: 81.0000 mg | DELAYED_RELEASE_TABLET | Freq: Every day | ORAL | Status: DC

## 2014-09-10 NOTE — Evaluation (Signed)
Physical Therapy Evaluation Patient Details Name: Dawn Cooper MRN: [id number] DOB: 19-Jul-1918 Today's Date: 09/10/2014   History of Present Illness  Dawn Cooper is a 81 y.o. female with a known history of CVA in February 2016 with no residual defects, hypertension, chronic kidney disease stage III, severe aortic stenosis, atrial fibrillation and hypertension presents today with slurred speech and ataxia starting possibly yesterday, but definitely present upon waking this morning. History is provided by her daughter who lives with her. She reports that her current symptoms are very similar to those she experienced in February with the last CVA. She has been in her usual state of health until yesterday. She is generally cognitively intact, uses a walker for ambulation. Last evening and again this her speech has been slurred, she has been frustrated at not being able to express herself, she has a slight left-sided facial droop and has been staggering around the house. No falls. No chest pain, palpitations, confusion. He is being admitted for further evaluation of probable CVA. She does have history of atrial fibrillation and had been on Pradaxa in the past which was stopped due to GI bleeding. Since her last hospitalization in February 2016 she has been on Plavix. Denies any missed medications.  Clinical Impression  Pt is received semirecumbent in bed upon entry, awake, alert, and willing to participate. No acute distress noted.  Pt is A&Ox3 and pleasant. Pt reports 1 fall in the last 6 months, back in Feb wherein she reports she simply lost her balance. Pt strength as screened by MMT is 5/5 throughout with mildly weak grip strength; strength as observedduring during functional mobility assessment is WNL. Pt falls risk is high as evidenced by slow gait speed, poor forward reach, and altered posturing during transfers and mobility, but presents at baseline for all three. Patient presenting without new  impairment and at  baseline level of function. Skilled PT intervention is not warranted at this time as it pertains to the current CC/ hospitalization.       Follow Up Recommendations No PT follow up    Equipment Recommendations  None recommended by PT    Recommendations for Other Services       Precautions / Restrictions Precautions Precautions: Fall Restrictions Weight Bearing Restrictions: No      Mobility  Bed Mobility Overal bed mobility: Independent                Transfers Overall transfer level: Independent                  Ambulation/Gait Ambulation/Gait assistance: Modified independent (Device/Increase time);Supervision Ambulation Distance (Feet): 350 Feet Assistive device: 1 person hand held assist Gait Pattern/deviations: WFL(Within Functional Limits)   Gait velocity interpretation: <1.8 ft/sec, indicative of risk for recurrent falls General Gait Details: Pt reports she feels gait is same as PTA.   Stairs            Wheelchair Mobility    Modified Rankin (Stroke Patients Only)       Balance Overall balance assessment: Modified Independent                                           Pertinent Vitals/Pain Pain Assessment: No/denies pain    Home Living Family/patient expects to be discharged to:: Private residence Living Arrangements: Children Available Help at Discharge: Family Type of Home: House Home Access: Stairs to  enter   Entrance Stairs-Number of Steps: 1 partial step (threshold)  Home Layout: One level Home Equipment: Walker - 2 wheels;Cane - single point      Prior Function Level of Independence: Independent with assistive device(s)         Comments: Still performs all ADL indep but is no longer driving.      Hand Dominance   Dominant Hand: Right    Extremity/Trunk Assessment   Upper Extremity Assessment: Overall WFL for tasks assessed           Lower Extremity Assessment:  Overall WFL for tasks assessed      Cervical / Trunk Assessment: Normal (mildly forward flexed posture. )  Communication   Communication: No difficulties  Cognition Arousal/Alertness: Awake/alert Behavior During Therapy: WFL for tasks assessed/performed Overall Cognitive Status: Within Functional Limits for tasks assessed                      General Comments General comments (skin integrity, edema, etc.): mildly wobbly with HHA amb, but amb c RW or SPC at baseline. No LOB seen.     Exercises        Assessment/Plan    PT Assessment Patent does not need any further PT services  PT Diagnosis     PT Problem List    PT Treatment Interventions     PT Goals (Current goals can be found in the Care Plan section) Acute Rehab PT Goals Patient Stated Goal: To go home. Family agrees PT Goal Formulation: All assessment and education complete, DC therapy    Frequency     Barriers to discharge        Co-evaluation               End of Session Equipment Utilized During Treatment: Gait belt Activity Tolerance: Patient tolerated treatment well Patient left: in bed;with family/visitor present;with bed alarm set Nurse Communication: Mobility status;Other (comment)         Time: 9450-3888 PT Time Calculation (min) (ACUTE ONLY): 14 min   Charges:   PT Evaluation $Initial PT Evaluation Tier I: 1 Procedure     PT G Codes:        Anderson Coppock C 2014-09-20, 2:18 PM  2:22 PM  Etta Grandchild, PT, DPT [place] License # [id number]

## 2014-09-10 NOTE — Progress Notes (Addendum)
[facility] [place] NAME: Dawn Cooper    MR#:  [id number]  DATE OF BIRTH:  [birth year]  SUBJECTIVE:  CHIEF COMPLAINT:   Chief Complaint  Patient presents with  . Stroke Symptoms   mild slurred speech  REVIEW OF SYSTEMS:  CONSTITUTIONAL: No fever, fatigue or weakness.  EYES: No blurred or double vision.  EARS, NOSE, AND THROAT: No tinnitus or ear pain.  RESPIRATORY: No cough, shortness of breath, wheezing or hemoptysis.  CARDIOVASCULAR: No chest pain, orthopnea, edema.  GASTROINTESTINAL: No nausea, vomiting, diarrhea or abdominal pain.  GENITOURINARY: No dysuria, hematuria.  ENDOCRINE: No polyuria, nocturia,  HEMATOLOGY: No anemia, easy bruising or bleeding SKIN: No rash or lesion. MUSCULOSKELETAL: No joint pain or arthritis.   NEUROLOGIC: No tingling, numbness, weakness. Mild slurred speech PSYCHIATRY: No anxiety or depression.   DRUG ALLERGIES:   Allergies  Allergen Reactions  . Pradaxa [Dabigatran Etexilate Mesylate] Other (See Comments)    Reaction:  Bleeding   . Clonidine Derivatives Itching and Rash  . Penicillins Itching and Rash    VITALS:  Blood pressure 142/72, pulse 62, temperature 97.7 F (36.5 C), temperature source Oral, resp. rate 18, height 5' (1.524 m), weight 57.063 kg (125 lb 12.8 oz), SpO2 100 %.  PHYSICAL EXAMINATION:  GENERAL:  82 y.o.-year-old patient lying in the bed with no acute distress.  EYES: Pupils equal, round, reactive to light and accommodation. No scleral icterus. Extraocular muscles intact.  HEENT: Head atraumatic, normocephalic. Oropharynx and nasopharynx clear.  NECK:  Supple, no jugular venous distention. No thyroid enlargement, no tenderness.  LUNGS: Normal breath sounds bilaterally, no wheezing, rales,rhonchi or crepitation. No use of accessory muscles of respiration.  CARDIOVASCULAR: S1, S2 normal. No murmurs, rubs, or gallops.  ABDOMEN: Soft, nontender, nondistended. Bowel  sounds present. No organomegaly or mass.  EXTREMITIES: No pedal edema, cyanosis, or clubbing.  NEUROLOGIC: Cranial nerves II through XII are intact. Muscle strength 5/5 in all extremities. Sensation intact. Gait not checked.  PSYCHIATRIC: The patient is alert and oriented x 3.  SKIN: No obvious rash, lesion, or ulcer.    LABORATORY PANEL:   CBC  Recent Labs Lab 09/09/14 1539  WBC 5.7  HGB 11.1*  HCT 33.2*  PLT 183   ------------------------------------------------------------------------------------------------------------------  Chemistries   Recent Labs Lab 09/09/14 1539  NA 139  K 3.9  CL 106  CO2 26  GLUCOSE 119*  BUN 16  CREATININE 1.56*  CALCIUM 9.4  AST 23  ALT 10*  ALKPHOS 52  BILITOT 0.6   ------------------------------------------------------------------------------------------------------------------  Cardiac Enzymes  Recent Labs Lab 09/09/14 1539  TROPONINI <0.03   ------------------------------------------------------------------------------------------------------------------  RADIOLOGY:  Ct Head Wo Contrast  09/09/2014   CLINICAL DATA:  Slurred speech  EXAM: CT HEAD WITHOUT CONTRAST  TECHNIQUE: Contiguous axial images were obtained from the base of the skull through the vertex without intravenous contrast.  COMPARISON:  05/20/2014  FINDINGS: The bony calvarium is intact. Diffuse atrophic and chronic white matter ischemic changes are noted. Findings of prior infarct in the posterior right parietal lobe are seen and stable no acute hemorrhage, acute infarction or space-occupying mass lesion is identified. Lacunar infarct is noted within the left thalamus stable from the prior exam.  IMPRESSION: Chronic atrophic and ischemic changes without acute abnormality.   Electronically Signed   By: Inez Catalina M.D.   On: 09/09/2014 16:40   Mr Brain Wo Contrast  09/10/2014   CLINICAL DATA:  Slurred speech  EXAM: MRI HEAD  WITHOUT CONTRAST  TECHNIQUE:  Multiplanar, multiecho pulse sequences of the brain and surrounding structures were obtained without intravenous contrast.  COMPARISON:  CT head 09/09/2014, MRI 05/20/2014  FINDINGS: Acute infarct left MCA territory involving the left frontal parietal operculum and left posterior frontal deep white matter. This involves the precentral cortex. No other acute infarct.  Moderate atrophy. Chronic microvascular ischemic changes in the white matter and bilateral thalamus. Chronic infarct right occipital parietal lobe.  Negative for mass lesion. Small amount of hemorrhage in the right parietal infarct, chronic  Paranasal sinuses clear.  Left mastoid sinus effusion.  IMPRESSION: Acute infarct left MCA territory involving the left frontal parietal operculum and left posterior frontal deep white matter.  Atrophy and chronic ischemia as above.   Electronically Signed   By: Franchot Gallo M.D.   On: 09/10/2014 09:54   Dg Chest Portable 1 View  09/09/2014   CLINICAL DATA:  Slurred speech, altered mental status  EXAM: PORTABLE CHEST - 1 VIEW  COMPARISON:  05/19/2014  FINDINGS: Moderate enlargement of the cardiomediastinal silhouette noted with aortic unfolding but no evidence for edema. Linear left lower lobe scarring is again noted. No pleural effusion. Right lung is clear. No acute osseous abnormality.  IMPRESSION: Moderate apparent cardiomegaly without focal acute finding.   Electronically Signed   By: Conchita Paris M.D.   On: 09/09/2014 16:28   Mr Jodene Nam Head/brain Wo Cm  09/10/2014   CLINICAL DATA:  Slurred speech.  Stroke.  EXAM: MRA HEAD WITHOUT CONTRAST  TECHNIQUE: Angiographic images of the Circle of Willis were obtained using MRA technique without intravenous contrast.  COMPARISON:  MRI head today  FINDINGS: Atherosclerotic irregularity throughout the distal vertebral artery bilaterally with multiple areas of mild-to-moderate stenosis. PICA patent bilaterally. Atherosclerotic irregularity in the basilar with mild  stenosis diffusely. PICA patent bilaterally. Fetal origin of the left posterior cerebral artery. Mild disease in the posterior cerebral arteries bilaterally  Cavernous carotid is tortuous but without significant stenosis. Anterior cerebral arteries patent bilaterally. Signal loss in the right parietal branch of the right MCA and in the left temporal branch of the left MCA felt to be due to tortuosity. No definite middle cerebral artery stenosis.  Negative for cerebral aneurysm.  IMPRESSION: Moderate disease in the posterior circulation diffusely. Anterior circulation appears patent without significant stenosis.   Electronically Signed   By: Franchot Gallo M.D.   On: 09/10/2014 09:58    EKG:   Orders placed or performed during the hospital encounter of 09/09/14  . ED EKG  . ED EKG  . EKG 12-Lead  . EKG 12-Lead    ASSESSMENT AND PLAN:   #1 Acute infarct left MCA CVA:  Continue aspirin, Plavix , follow-up occupational and physical therapy consultations. Follow-up neurology for recommendations.  #2 atrial fibrillation: Not on anticoagulation due to history of GI bleed with Protonix. Rate currently controlled. Continue metoprolol  #3 hypertension: Continue metoprolol and lisinopril.  #4 severe aortic stenosis.  #5 chronic kidney disease stage III: Stable  Hyperlipidemia. Not on starting due to allergy.     All the records are reviewed and case discussed with Care Management/Social Workerr. Management plans discussed with the patient, the patient's daughter and they are in agreement.  CODE STATUS: DNR  TOTAL TIME TAKING CARE OF THIS PATIENT: 37  minutes.   POSSIBLE D/C IN 1-2 DAYS, DEPENDING ON CLINICAL CONDITION.   Demetrios Loll M.D on 09/10/2014 at 1:29 PM  Between 7am to 6pm - Pager - [phone]  After  6pm go to [email or web address] - password [email or web address] [facility]  Office  [phone]  CC: Primary care physician; Rica Mast, MD

## 2014-09-10 NOTE — Discharge Summary (Addendum)
[facility] [place] NAME: Dawn Cooper    MR#:  [id number]  DATE OF BIRTH:  February 14, 1919  DATE OF ADMISSION:  09/09/2014 ADMITTING PHYSICIAN: Aldean Jewett, MD  DATE OF DISCHARGE: 09/10/2014  PRIMARY CARE PHYSICIAN: Rica Mast, MD    ADMISSION DIAGNOSIS:  Stroke, acute, thrombotic [I63.30]   DISCHARGE DIAGNOSIS:  Acute CVA  SECONDARY DIAGNOSIS:   Past Medical History  Diagnosis Date  . Hypertension   . GERD (gastroesophageal reflux disease)   . A-fib   . Mild mitral regurgitation by prior echocardiogram   . Tricuspid regurgitation   . Cardiomyopathy   . Moderate aortic stenosis   . Hiatal hernia   . Broken leg     right  . Stroke   . Heart murmur   . Chronic kidney disease     HOSPITAL COURSE:   Dawn Cooper is a 81 y.o. female with a known history of CVA in February 2016 with no residual defects, hypertension, chronic kidney disease stage III, severe aortic stenosis, atrial fibrillation and hypertension presents today with slurred speech and ataxia starting possibly yesterday, but definitely present upon waking this morning. History is provided by her daughter who lives with her. She reports that her current symptoms are very similar to those she experienced in February with the last CVA. She has been in her usual state of health until yesterday. She is generally cognitively intact, uses a walker for ambulation. Last evening and again this her speech has been slurred, she has been frustrated at not being able to express herself, she has a slight left-sided facial droop and has been staggering around the house. No falls. No chest pain, palpitations, confusion. He is being admitted for further evaluation of probable CVA. She does have history of atrial fibrillation and had been on Pradaxa in the past which was stopped due to GI bleeding. Since her last hospitalization in February 2016 she has been on Plavix.   The patient has been treated with the aspirin and the Plavix after admission. MRI of brain shows Acute infarct left MCA infarct. The patient got the speech study, PT and OT today. They suggested no need for home physical surgery. The patient only has mild slurred speech but medically stable. She isn't clinically stable will be discharged home today. Discussed the patient's condition and the plan discharge with the patient's daughter and granddaughter.  DISCHARGE CONDITIONS:   Stable  CONSULTS OBTAINED:  Treatment Team:  Leotis Pain, MD Demetrios Loll, MD  DRUG ALLERGIES:   Allergies  Allergen Reactions  . Pradaxa [Dabigatran Etexilate Mesylate] Other (See Comments)    Reaction:  Bleeding   . Clonidine Derivatives Itching and Rash  . Penicillins Itching and Rash    DISCHARGE MEDICATIONS:   Current Discharge Medication List    START taking these medications   Details  aspirin EC 81 MG tablet Take 1 tablet (81 mg total) by mouth daily. Qty: 30 tablet, Refills: 0      CONTINUE these medications which have NOT CHANGED   Details  clopidogrel (PLAVIX) 75 MG tablet Take 1 tablet (75 mg total) by mouth daily. Qty: 90 tablet, Refills: 3    furosemide (LASIX) 20 MG tablet Take 1 tablet (20 mg total) by mouth every other day. Qty: 45 tablet, Refills: 3   Associated Diagnoses: Edema    isosorbide mononitrate (IMDUR) 30 MG 24 hr tablet Take 1 tablet (30 mg total) by mouth daily. Qty: 90 tablet,  Refills: 4    lisinopril (PRINIVIL,ZESTRIL) 40 MG tablet Take 0.5 tablets (20 mg total) by mouth daily. Qty: 90 tablet, Refills: 1    metoprolol tartrate (LOPRESSOR) 25 MG tablet Take 25 mg by mouth 2 (two) times daily.    pantoprazole (PROTONIX) 40 MG tablet Take 40 mg by mouth daily.    potassium chloride (K-DUR,KLOR-CON) 10 MEQ tablet Take 1 tablet (10 mEq total) by mouth every other day. Qty: 45 tablet, Refills: 1         DISCHARGE INSTRUCTIONS:      If you experience  worsening of your admission symptoms, develop shortness of breath, life threatening emergency, suicidal or homicidal thoughts you must seek medical attention immediately by calling 911 or calling your MD immediately  if symptoms less severe.  You Must read complete instructions/literature along with all the possible adverse reactions/side effects for all the Medicines you take and that have been prescribed to you. Take any new Medicines after you have completely understood and accept all the possible adverse reactions/side effects.   Please note  You were cared for by a hospitalist during your hospital stay. If you have any questions about your discharge medications or the care you received while you were in the hospital after you are discharged, you can call the unit and asked to speak with the hospitalist on call if the hospitalist that took care of you is not available. Once you are discharged, your primary care physician will handle any further medical issues. Please note that NO REFILLS for any discharge medications will be authorized once you are discharged, as it is imperative that you return to your primary care physician (or establish a relationship with a primary care physician if you do not have one) for your aftercare needs so that they can reassess your need for medications and monitor your lab values.    Today   SUBJECTIVE    No complaint except for mild slurred speech.  VITAL SIGNS:  Blood pressure 142/72, pulse 62, temperature 97.7 F (36.5 C), temperature source Oral, resp. rate 18, height 5' (1.524 m), weight 57.063 kg (125 lb 12.8 oz), SpO2 100 %.  I/O:   Intake/Output Summary (Last 24 hours) at 09/10/14 1505 Last data filed at 09/10/14 1300  Gross per 24 hour  Intake    240 ml  Output    350 ml  Net   -110 ml    PHYSICAL EXAMINATION:  GENERAL:  81 y.o.-year-old patient lying in the bed with no acute distress.  EYES: Pupils equal, round, reactive to light and  accommodation. No scleral icterus. Extraocular muscles intact.  HEENT: Head atraumatic, normocephalic. Oropharynx and nasopharynx clear.  NECK:  Supple, no jugular venous distention. No thyroid enlargement, no tenderness.  LUNGS: Normal breath sounds bilaterally, no wheezing, rales,rhonchi or crepitation. No use of accessory muscles of respiration.  CARDIOVASCULAR: S1, S2 normal. No murmurs, rubs, or gallops.  ABDOMEN: Soft, non-tender, non-distended. Bowel sounds present. No organomegaly or mass.  EXTREMITIES: No pedal edema, cyanosis, or clubbing.  NEUROLOGIC: Cranial nerves II through XII are intact. Muscle strength 5/5 in all extremities. Sensation intact. Gait not checked.  PSYCHIATRIC: The patient is alert and oriented x 3.  SKIN: No obvious rash, lesion, or ulcer.   DATA REVIEW:   CBC  Recent Labs Lab 09/09/14 1539  WBC 5.7  HGB 11.1*  HCT 33.2*  PLT 183    Chemistries   Recent Labs Lab 09/09/14 1539  NA 139  K 3.9  CL 106  CO2 26  GLUCOSE 119*  BUN 16  CREATININE 1.56*  CALCIUM 9.4  AST 23  ALT 10*  ALKPHOS 52  BILITOT 0.6    Cardiac Enzymes  Recent Labs Lab 09/09/14 1539  TROPONINI <0.03    Microbiology Results  Results for orders placed or performed in visit on 12/27/13  Fecal occult blood, imunochemical     Status: None   Collection Time: 12/27/13 11:10 AM  Result Value Ref Range Status   Fecal Occult Bld Negative Negative Final    RADIOLOGY:  Ct Head Wo Contrast  09/09/2014   CLINICAL DATA:  Slurred speech  EXAM: CT HEAD WITHOUT CONTRAST  TECHNIQUE: Contiguous axial images were obtained from the base of the skull through the vertex without intravenous contrast.  COMPARISON:  05/20/2014  FINDINGS: The bony calvarium is intact. Diffuse atrophic and chronic white matter ischemic changes are noted. Findings of prior infarct in the posterior right parietal lobe are seen and stable no acute hemorrhage, acute infarction or space-occupying mass lesion  is identified. Lacunar infarct is noted within the left thalamus stable from the prior exam.  IMPRESSION: Chronic atrophic and ischemic changes without acute abnormality.   Electronically Signed   By: Inez Catalina M.D.   On: 09/09/2014 16:40   Mr Brain Wo Contrast  09/10/2014   CLINICAL DATA:  Slurred speech  EXAM: MRI HEAD WITHOUT CONTRAST  TECHNIQUE: Multiplanar, multiecho pulse sequences of the brain and surrounding structures were obtained without intravenous contrast.  COMPARISON:  CT head 09/09/2014, MRI 05/20/2014  FINDINGS: Acute infarct left MCA territory involving the left frontal parietal operculum and left posterior frontal deep white matter. This involves the precentral cortex. No other acute infarct.  Moderate atrophy. Chronic microvascular ischemic changes in the white matter and bilateral thalamus. Chronic infarct right occipital parietal lobe.  Negative for mass lesion. Small amount of hemorrhage in the right parietal infarct, chronic  Paranasal sinuses clear.  Left mastoid sinus effusion.  IMPRESSION: Acute infarct left MCA territory involving the left frontal parietal operculum and left posterior frontal deep white matter.  Atrophy and chronic ischemia as above.   Electronically Signed   By: Franchot Gallo M.D.   On: 09/10/2014 09:54   Dg Chest Portable 1 View  09/09/2014   CLINICAL DATA:  Slurred speech, altered mental status  EXAM: PORTABLE CHEST - 1 VIEW  COMPARISON:  05/19/2014  FINDINGS: Moderate enlargement of the cardiomediastinal silhouette noted with aortic unfolding but no evidence for edema. Linear left lower lobe scarring is again noted. No pleural effusion. Right lung is clear. No acute osseous abnormality.  IMPRESSION: Moderate apparent cardiomegaly without focal acute finding.   Electronically Signed   By: Conchita Paris M.D.   On: 09/09/2014 16:28   Mr Jodene Nam Head/brain Wo Cm  09/10/2014   CLINICAL DATA:  Slurred speech.  Stroke.  EXAM: MRA HEAD WITHOUT CONTRAST  TECHNIQUE:  Angiographic images of the Circle of Willis were obtained using MRA technique without intravenous contrast.  COMPARISON:  MRI head today  FINDINGS: Atherosclerotic irregularity throughout the distal vertebral artery bilaterally with multiple areas of mild-to-moderate stenosis. PICA patent bilaterally. Atherosclerotic irregularity in the basilar with mild stenosis diffusely. PICA patent bilaterally. Fetal origin of the left posterior cerebral artery. Mild disease in the posterior cerebral arteries bilaterally  Cavernous carotid is tortuous but without significant stenosis. Anterior cerebral arteries patent bilaterally. Signal loss in the right parietal branch of the right MCA and in the left temporal branch of  the left MCA felt to be due to tortuosity. No definite middle cerebral artery stenosis.  Negative for cerebral aneurysm.  IMPRESSION: Moderate disease in the posterior circulation diffusely. Anterior circulation appears patent without significant stenosis.   Electronically Signed   By: Franchot Gallo M.D.   On: 09/10/2014 09:58        Management plans discussed with the patient, family and they are in agreement.  CODE STATUS:     Code Status Orders        Start     Ordered   09/09/14 1926  Do not attempt resuscitation (DNR)   Continuous    Question Answer Comment  In the event of cardiac or respiratory ARREST Do not call a "code blue"   In the event of cardiac or respiratory ARREST Do not perform Intubation, CPR, defibrillation or ACLS   In the event of cardiac or respiratory ARREST Use medication by any route, position, wound care, and other measures to relive pain and suffering. May use oxygen, suction and manual treatment of airway obstruction as needed for comfort.      09/09/14 1925    Advance Directive Documentation        Most Recent Value   Type of Advance Directive  Healthcare Power of Attorney   Pre-existing out of facility DNR order (yellow form or pink MOST form)      "MOST" Form in Place?        TOTAL TIME TAKING CARE OF THIS PATIENT: 38 minutes.    Demetrios Loll M.D on 09/10/2014 at 3:05 PM  Between 7am to 6pm - Pager - [phone]  After 6pm go to [email or web address] - password [email or web address] [facility]  Office  [phone]  CC: Primary care physician; Rica Mast, MD

## 2014-09-10 NOTE — Progress Notes (Signed)
Pt in NAD, skin warm and dry, respirations even and unlabored.  VSS,  A FIB per monitor.  IV and telemetry discontinued per policy and procedure.  Discharge instructions given to and reviewed with patient and patients family.  Verbalized understanding.  Pt discharged home.

## 2014-09-10 NOTE — Evaluation (Signed)
Clinical/Bedside Swallow Evaluation Patient Details  Name: Dawn Cooper MRN: [id number] Date of Birth: 1918-06-01  Today's Date: 09/10/2014 Time: SLP Start Time (ACUTE ONLY): 48 SLP Stop Time (ACUTE ONLY): 1330 SLP Time Calculation (min) (ACUTE ONLY): 60 min  Past Medical History:  Past Medical History  Diagnosis Date  . Hypertension   . GERD (gastroesophageal reflux disease)   . A-fib   . Mild mitral regurgitation by prior echocardiogram   . Tricuspid regurgitation   . Cardiomyopathy   . Moderate aortic stenosis   . Hiatal hernia   . Broken leg     right  . Stroke   . Heart murmur   . Chronic kidney disease    Past Surgical History:  Past Surgical History  Procedure Laterality Date  . Appendectomy    . Gallbladder surgery    . Cholecystectomy    . Vaginal delivery    . Cataract extraction  2013    left  . Femur fracture surgery  12/2011    Dr. Sabra Heck, s/p rehab at [facility]   HPI:  pt c/o min. slurred speech; family stated it was "much better now". Noted slight decreased articulation of certain speech sounds during conversation. Pt denied any trouble swallowing since "this episode".    Assessment / Plan / Recommendation Clinical Impression  Pt appears at her baseline w/ swallowing w/ no overt s/s of aspiration noted during trials today. Pt consumed trials feeding herself w/ no oral phase deficits noted as well. Discussed general aspiration precautions including meds w/ puree for easier swallowing as nec. Pt appears at reduced risk for aspiration at this time. Discussed general exercises to target min. Dysarthria of speech(certain speech sounds w/ decreased precision during conversation); this did not affect her intelligibility during conversation. Rec'd f/u w/ MD for request for ST f/u [facility]?) if desired; pt did not feel this would be nec. Family agreed. No further skilled ST servicies indicated at this time. NSG updated. Pt/family agreed.    Aspiration Risk   (reduced)     Diet Recommendation Age appropriate regular solids;Thin   Medication Administration: Whole meds with puree (as nec. for easier swallowing) Compensations: Slow rate;Small sips/bites    Other  Recommendations Oral Care Recommendations: Oral care BID;Oral care before and after PO;Staff/trained caregiver to provide oral care (as nec. )   Follow Up Recommendations       Frequency and Duration        Pertinent Vitals/Pain Denied    SLP Swallow Goals  no further skilled ST f/u indicated at this time   Swallow Study Prior Functional Status   pt resides at home w/ family    General Date of Onset: 09/09/14 Other Pertinent Information: pt c/o min. slurred speech; family stated it was "much better now". Noted slight decreased articulation of certain speech sounds during conversation. Pt denied any trouble swallowing since "this episode".  Type of Study: Bedside swallow evaluation Previous Swallow Assessment:  (none indicated) Diet Prior to this Study: Regular;Thin liquids Temperature Spikes Noted: No Respiratory Status: Room air History of Recent Intubation: No Behavior/Cognition: Alert;Cooperative;Pleasant mood Oral Cavity - Dentition:  (U/L dentures) Self-Feeding Abilities: Able to feed self;Needs set up Patient Positioning: Upright in bed Baseline Vocal Quality: Normal Volitional Cough: Strong Volitional Swallow: Able to elicit    Oral/Motor/Sensory Function Overall Oral Motor/Sensory Function: Appears within functional limits for tasks assessed Labial ROM: Within Functional Limits (grossly) Labial Symmetry: Within Functional Limits (grossly) Labial Strength: Within Functional Limits Labial Sensation: Within Functional Limits Lingual  ROM: Within Functional Limits Lingual Symmetry: Within Functional Limits Lingual Strength: Within Functional Limits Lingual Sensation: Within Functional Limits Facial Symmetry: Within Functional Limits Velum: Within Functional  Limits Mandible: Within Functional Limits   Ice Chips Ice chips: Not tested Other Comments:  (pt was just finishing her lunch meal)   Thin Liquid Thin Liquid: Within functional limits Presentation: Cup;Self Fed (x7 trials )    Nectar Thick Nectar Thick Liquid: Not tested   Honey Thick Honey Thick Liquid: Not tested   Puree Puree: Within functional limits Presentation: Self Fed;Spoon (x3 trials)   Solid   GO    Solid: Within functional limits Presentation: Self Fed (x2 trials )       Mahayla Haddaway 09/10/2014,6:57 PM

## 2014-09-10 NOTE — Evaluation (Signed)
Occupational Therapy Evaluation Patient Details Name: Sherronda Sweigert MRN: [id number] DOB: 07-26-1918 Today's Date: 09/10/2014    History of Present Illness 81 year old female who came to [facility] with speech deficits.   Clinical Impression   This patient is a 81 year old female who came to [facility] stroke symptoms (speech). She lives in a  home with daughter. She had been independent with ADL and functional mobility with the use of a cane inside and walker outside.  Today she performs bed mobility independently with no use of rails, transfers with contact guard assist. She has good strength (listed below) in her upper extremities with good grip. 9 hole peg test is 34.31 seconds on right and 31.71 seconds on left. No further Occupational Therapy needed at this time.      Follow Up Recommendations  No OT follow up    Equipment Recommendations       Recommendations for Other Services       Precautions / Restrictions Restrictions Weight Bearing Restrictions: No      Mobility Bed Mobility Overal bed mobility: Independent                Transfers Overall transfer level:  (supervision to Contact guard.)                    Balance                                            ADL                                         General ADL Comments: Patient independent with basic ADL. She does have a licence, but reports not using it.     Vision     Perception     Praxis      Pertinent Vitals/Pain       Hand Dominance Right   Extremity/Trunk Assessment Upper Extremity Assessment Upper Extremity Assessment:  (B UE strength 5/5 grip R 40 lbs, L 44 lbs)           Communication Communication Communication:  (mild)   Cognition Arousal/Alertness: Awake/alert Behavior During Therapy: WFL for tasks assessed/performed Overall Cognitive Status: Within Functional Limits for tasks assessed                     General Comments       Exercises       Shoulder Instructions      Home Living Family/patient expects to be discharged to:: Private residence Living Arrangements: Children (One daughter) Available Help at Discharge: Family Type of Home: House                                  Prior Functioning/Environment Level of Independence: Independent with assistive device(s) (cane inside - walker out side)             OT Diagnosis:     OT Problem List:     OT Treatment/Interventions:      OT Goals(Current goals can be found in the care plan section) Acute Rehab OT Goals Patient Stated Goal: To go home. Family agrees OT Goal Formulation: With patient/family  OT Frequency:     Barriers to D/C:            Co-evaluation              End of Session Equipment Utilized During Treatment: Gait belt  Activity Tolerance: Patient tolerated treatment well Patient left: in bed;with call bell/phone within reach;with bed alarm set;with family/visitor present   Time: 1240-1303 OT Time Calculation (min): 23 min Charges:  OT Evaluation $Initial OT Evaluation Tier I: 1 Procedure G-Codes: OT G-codes **NOT FOR INPATIENT CLASS** Functional Assessment Tool Used: stroke kit  Anna, MS/OTR/L  09/10/2014, 1:18 PM

## 2014-09-10 NOTE — Discharge Instructions (Signed)
Low sodium, low fat diet. Activity as tolerated.

## 2014-09-12 ENCOUNTER — Telehealth: Payer: Self-pay

## 2014-09-12 NOTE — Telephone Encounter (Signed)
Dr Gilford Rile says we can see her tomorrow at 1:00 if pt is okay with this.  If pt can come then, no call needed, if she cannot come until next week TCM call will need to be completed.

## 2014-09-12 NOTE — Telephone Encounter (Signed)
Dawn Cooper - Can we try to work her in this week for 14min slot?

## 2014-09-12 NOTE — Telephone Encounter (Signed)
The pt called and stated she was d/c from the hospital on Saturday (6/18) - she is scheduled for [facility] on Monday 6/27

## 2014-09-12 NOTE — Telephone Encounter (Signed)
Appt scheduled for tomorrow.  °

## 2014-09-13 ENCOUNTER — Ambulatory Visit (INDEPENDENT_AMBULATORY_CARE_PROVIDER_SITE_OTHER): Payer: Medicare Other | Admitting: Internal Medicine

## 2014-09-13 ENCOUNTER — Encounter: Payer: Self-pay | Admitting: Internal Medicine

## 2014-09-13 VITALS — BP 142/78 | HR 72 | Temp 98.1°F | Ht 60.0 in | Wt 138.4 lb

## 2014-09-13 DIAGNOSIS — R5382 Chronic fatigue, unspecified: Secondary | ICD-10-CM | POA: Diagnosis not present

## 2014-09-13 DIAGNOSIS — R634 Abnormal weight loss: Secondary | ICD-10-CM | POA: Insufficient documentation

## 2014-09-13 DIAGNOSIS — I480 Paroxysmal atrial fibrillation: Secondary | ICD-10-CM | POA: Diagnosis not present

## 2014-09-13 DIAGNOSIS — I10 Essential (primary) hypertension: Secondary | ICD-10-CM

## 2014-09-13 DIAGNOSIS — R531 Weakness: Secondary | ICD-10-CM | POA: Insufficient documentation

## 2014-09-13 DIAGNOSIS — I639 Cerebral infarction, unspecified: Secondary | ICD-10-CM

## 2014-09-13 LAB — COMPREHENSIVE METABOLIC PANEL
ALBUMIN: 3.6 g/dL (ref 3.5–5.2)
ALT: 11 U/L (ref 0–35)
AST: 23 U/L (ref 0–37)
Alkaline Phosphatase: 54 U/L (ref 39–117)
BUN: 15 mg/dL (ref 6–23)
CHLORIDE: 103 meq/L (ref 96–112)
CO2: 26 meq/L (ref 19–32)
Calcium: 9.7 mg/dL (ref 8.4–10.5)
Creatinine, Ser: 1.31 mg/dL — ABNORMAL HIGH (ref 0.40–1.20)
GFR: 40.19 mL/min — ABNORMAL LOW (ref >60.00)
GLUCOSE: 117 mg/dL — AB (ref 70–99)
POTASSIUM: 4.3 meq/L (ref 3.5–5.1)
Sodium: 135 mEq/L (ref 135–145)
Total Bilirubin: 0.6 mg/dL (ref 0.2–1.2)
Total Protein: 6.8 g/dL (ref 6.0–8.3)

## 2014-09-13 LAB — CBC
HCT: 34.6 % — ABNORMAL LOW (ref 36.0–46.0)
HEMOGLOBIN: 11.4 g/dL — AB (ref 12.0–15.0)
MCHC: 33 g/dL (ref 30.0–36.0)
MCV: 91.7 fl (ref 78.0–100.0)
PLATELETS: 200 10*3/uL (ref 150.0–400.0)
RBC: 3.77 Mil/uL — AB (ref 3.87–5.11)
RDW: 15 % (ref 11.5–15.5)
WBC: 6.6 10*3/uL (ref 4.0–10.5)

## 2014-09-13 MED ORDER — POTASSIUM CHLORIDE CRYS ER 10 MEQ PO TBCR: 10.0000 meq | EXTENDED_RELEASE_TABLET | ORAL | Status: DC

## 2014-09-13 NOTE — Assessment & Plan Note (Signed)
Left MCA CVA. Continue medical management with use of Plavix, Aspirin. Pt declines statin. Pt declines PT/OT. Follow up in 4 weeks and prn.

## 2014-09-13 NOTE — Progress Notes (Signed)
Pre visit review using our clinic review tool, if applicable. No additional management support is needed unless otherwise documented below in the visit note. 

## 2014-09-13 NOTE — Assessment & Plan Note (Signed)
BP Readings from Last 3 Encounters:  09/13/14 142/78  09/10/14 136/67  07/25/14 133/71   BP initially elevated today, however improved after resting a few min. Will monitor BP at home and recheck at nurse visit in 4 weeks.

## 2014-09-13 NOTE — Assessment & Plan Note (Signed)
NSR today. Rate controlled with metoprolol. Anticoagulated with Clopidogrel and Aspirin only given h/o GI bleed.

## 2014-09-13 NOTE — Progress Notes (Signed)
Subjective:    Patient ID: Dawn Cooper, female    DOB: 1919-03-02, 81 y.o.   MRN: [id number]  HPI  81YO female presents for hospital follow up.  ADMITTED: 09/09/2014 DISCHARGED: 09/10/2014 DIAGNOSIS: CVA  MRI BRAIN showed acute left MCA territory stroke.  Developed slurred speech x 1 day. Went to ED. MRI showed left MCA CVA. No changes made in mediations. Feeling tired. Both legs somewhat weak, but no focal weakness, or numbness noted. Trying to increase po intake.  BP at home typically 332 systolic. Compliant with medications. No CP, HA.  BP Readings from Last 3 Encounters:  09/13/14 142/78  09/10/14 136/67  07/25/14 133/71     Past medical, surgical, family and social history per today's encounter.  Review of Systems  Constitutional: Positive for fatigue. Negative for fever, chills, appetite change and unexpected weight change.  Eyes: Negative for visual disturbance.  Respiratory: Negative for shortness of breath.   Cardiovascular: Negative for chest pain, palpitations and leg swelling.  Gastrointestinal: Negative for vomiting, abdominal pain, diarrhea, constipation and blood in stool.  Musculoskeletal: Negative for myalgias and arthralgias.  Skin: Negative for color change and rash.  Neurological: Positive for speech difficulty and weakness. Negative for tremors, syncope, numbness and headaches.  Hematological: Negative for adenopathy. Does not bruise/bleed easily.  Psychiatric/Behavioral: Negative for sleep disturbance and dysphoric mood. The patient is not nervous/anxious.        Objective:    BP 142/78 mmHg  Pulse 72  Temp(Src) 98.1 F (36.7 C) (Oral)  Ht 5' (1.524 m)  Wt 138 lb 6 oz (62.766 kg)  BMI 27.02 kg/m2  SpO2 98% Physical Exam  Constitutional: She is oriented to person, place, and time. She appears well-developed and well-nourished. No distress.  HENT:  Head: Normocephalic and atraumatic.  Right Ear: External ear normal.  Left Ear:  External ear normal.  Nose: Nose normal.  Mouth/Throat: Oropharynx is clear and moist. No oropharyngeal exudate.  Eyes: Conjunctivae are normal. Pupils are equal, round, and reactive to light. Right eye exhibits no discharge. Left eye exhibits no discharge. No scleral icterus.  Neck: Normal range of motion. Neck supple. No tracheal deviation present. No thyromegaly present.  Cardiovascular: Normal rate, regular rhythm, normal heart sounds and intact distal pulses.  Exam reveals no gallop and no friction rub.   No murmur heard. Pulmonary/Chest: Effort normal and breath sounds normal. No respiratory distress. She has no wheezes. She has no rales. She exhibits no tenderness.  Musculoskeletal: Normal range of motion. She exhibits no edema or tenderness.  Lymphadenopathy:    She has no cervical adenopathy.  Neurological: She is alert and oriented to person, place, and time. No cranial nerve deficit. She exhibits normal muscle tone. Coordination normal.  Skin: Skin is warm and dry. No rash noted. She is not diaphoretic. No erythema. No pallor.  Psychiatric: She has a normal mood and affect. Her speech is normal and behavior is normal. Judgment and thought content normal. Cognition and memory are normal.          Assessment & Plan:   Problem List Items Addressed This Visit      Unprioritized   Atrial fibrillation    NSR today. Rate controlled with metoprolol. Anticoagulated with Clopidogrel and Aspirin only given h/o GI bleed.      Chronic fatigue    Recent fatigue likely multifactorial. Will recheck CBC as Hgb lower on recent ED check. Also check CMP.      Relevant Orders   CBC  Comprehensive metabolic panel   CVA (cerebral vascular accident) - Primary    Left MCA CVA. Continue medical management with use of Plavix, Aspirin. Pt declines statin. Pt declines PT/OT. Follow up in 4 weeks and prn.      HTN (hypertension)    BP Readings from Last 3 Encounters:  09/13/14 142/78    09/10/14 136/67  07/25/14 133/71   BP initially elevated today, however improved after resting a few min. Will monitor BP at home and recheck at nurse visit in 4 weeks.          Return in about 4 weeks (around 10/11/2014) for Recheck.

## 2014-09-13 NOTE — Assessment & Plan Note (Signed)
Recent fatigue likely multifactorial. Will recheck CBC as Hgb lower on recent ED check. Also check CMP.

## 2014-09-13 NOTE — Patient Instructions (Addendum)
Move dose of Pantoprazole to bedtime.  Labs today.  Follow up in 4 weeks or sooner as needed.

## 2014-09-19 ENCOUNTER — Ambulatory Visit: Payer: Medicare Other | Admitting: Nurse Practitioner

## 2014-10-11 ENCOUNTER — Ambulatory Visit (INDEPENDENT_AMBULATORY_CARE_PROVIDER_SITE_OTHER): Payer: Medicare Other | Admitting: *Deleted

## 2014-10-11 DIAGNOSIS — E538 Deficiency of other specified B group vitamins: Secondary | ICD-10-CM | POA: Diagnosis not present

## 2014-10-11 MED ORDER — CYANOCOBALAMIN 1000 MCG/ML IJ SOLN
1000.0000 ug | Freq: Once | INTRAMUSCULAR | Status: AC
  Administered 2014-10-11: 1000 ug via INTRAMUSCULAR

## 2014-10-27 ENCOUNTER — Ambulatory Visit (INDEPENDENT_AMBULATORY_CARE_PROVIDER_SITE_OTHER): Payer: Medicare Other | Admitting: Internal Medicine

## 2014-10-27 ENCOUNTER — Encounter: Payer: Self-pay | Admitting: Internal Medicine

## 2014-10-27 VITALS — BP 122/75 | HR 72 | Temp 97.8°F | Ht 60.0 in | Wt 137.1 lb

## 2014-10-27 DIAGNOSIS — M2550 Pain in unspecified joint: Secondary | ICD-10-CM

## 2014-10-27 DIAGNOSIS — I639 Cerebral infarction, unspecified: Secondary | ICD-10-CM | POA: Diagnosis not present

## 2014-10-27 DIAGNOSIS — L309 Dermatitis, unspecified: Secondary | ICD-10-CM

## 2014-10-27 DIAGNOSIS — I10 Essential (primary) hypertension: Secondary | ICD-10-CM

## 2014-10-27 DIAGNOSIS — R2681 Unsteadiness on feet: Secondary | ICD-10-CM | POA: Diagnosis not present

## 2014-10-27 MED ORDER — TRIAMCINOLONE ACETONIDE 0.1 % EX CREA: 1.0000 "application " | TOPICAL_CREAM | Freq: Two times a day (BID) | CUTANEOUS | Status: DC

## 2014-10-27 NOTE — Assessment & Plan Note (Signed)
Scaling rash over bilateral elbows most consistent with psoriasis. Will try adding topical steroid. If no improvement, consider more potent steroid and refer to derm. Also encouraged use of emollient cream.

## 2014-10-27 NOTE — Progress Notes (Signed)
Pre visit review using our clinic review tool, if applicable. No additional management support is needed unless otherwise documented below in the visit note. 

## 2014-10-27 NOTE — Patient Instructions (Signed)
Try icing left knee 32min 2-3 times daily. Continue Tylenol.  Call if knee pain is persistent.  Follow up in 3 months or sooner as needed.

## 2014-10-27 NOTE — Assessment & Plan Note (Signed)
Encouraged falls prevention with use of cane or Sherrye Puga to help with stability.

## 2014-10-27 NOTE — Assessment & Plan Note (Signed)
BP Readings from Last 3 Encounters:  10/27/14 122/75  09/13/14 142/78  09/10/14 136/67   BP well controlled. Will plan to continue current medications and recheck renal function with labs in 3 months.

## 2014-10-27 NOTE — Assessment & Plan Note (Signed)
Left knee pain, likely OA. Discussed options of adding Tramadol. She prefers not to add medication right now. Encouraged her to ice her knee. Discussed potentially getting a plain xray, however she prefers to hold off. Follow up prn.

## 2014-10-27 NOTE — Progress Notes (Signed)
Subjective:    Patient ID: Dawn Cooper, female    DOB: [birth year], 81 y.o.   MRN: [id number]  HPI  81YO female presents for follow up.  Knee pain - Left knee aching pain. No injury. No swelling. Took Tylenol with minimal improvement.  Rash - scaling rash over bilateral elbows. Unsure how long this has been present. Using OTC topical ointment, unsure name, with no improvement. Not painful or itching.  BP Readings from Last 3 Encounters:  10/27/14 122/75  09/13/14 142/78  09/10/14 136/67     Wt Readings from Last 3 Encounters:  10/27/14 137 lb 2 oz (62.199 kg)  09/13/14 138 lb 6 oz (62.766 kg)  09/10/14 125 lb 12.8 oz (57.063 kg)    Past medical, surgical, family and social history per today's encounter.   Review of Systems  Constitutional: Negative for fever, chills, appetite change, fatigue and unexpected weight change.  Eyes: Negative for visual disturbance.  Respiratory: Negative for shortness of breath.   Cardiovascular: Negative for chest pain and leg swelling.  Gastrointestinal: Negative for nausea, vomiting, abdominal pain, diarrhea and constipation.  Musculoskeletal: Positive for arthralgias and gait problem. Negative for myalgias and joint swelling.  Skin: Positive for color change and rash.  Hematological: Negative for adenopathy. Bruises/bleeds easily.  Psychiatric/Behavioral: Negative for dysphoric mood. The patient is not nervous/anxious.        Objective:    BP 122/75 mmHg  Pulse 72  Temp(Src) 97.8 F (36.6 C) (Oral)  Ht 5' (1.524 m)  Wt 137 lb 2 oz (62.199 kg)  BMI 26.78 kg/m2  SpO2 98% Physical Exam  Constitutional: She is oriented to person, place, and time. She appears well-developed and well-nourished. No distress.  HENT:  Head: Normocephalic and atraumatic.  Right Ear: External ear normal.  Left Ear: External ear normal.  Nose: Nose normal.  Mouth/Throat: Oropharynx is clear and moist. No oropharyngeal exudate.  Eyes: Conjunctivae  are normal. Pupils are equal, round, and reactive to light. Right eye exhibits no discharge. Left eye exhibits no discharge. No scleral icterus.  Neck: Normal range of motion. Neck supple. No tracheal deviation present. No thyromegaly present.  Cardiovascular: Normal rate, regular rhythm and intact distal pulses.  Exam reveals no gallop and no friction rub.   Murmur heard.  Systolic murmur is present  Pulmonary/Chest: Effort normal and breath sounds normal. No respiratory distress. She has no wheezes. She has no rales. She exhibits no tenderness.  Musculoskeletal: Normal range of motion. She exhibits no edema or tenderness.       Left knee: She exhibits normal range of motion and no swelling. No tenderness found.  Lymphadenopathy:    She has no cervical adenopathy.  Neurological: She is alert and oriented to person, place, and time. No cranial nerve deficit. She exhibits normal muscle tone. Coordination normal.  Skin: Skin is warm and dry. Ecchymosis (several ecchymoses over bilateral forearms) and rash noted. Rash is pustular (scaling rash bilateral elbows). She is not diaphoretic. There is erythema. No pallor.  Psychiatric: She has a normal mood and affect. Her behavior is normal. Judgment and thought content normal.          Assessment & Plan:   Problem List Items Addressed This Visit      Unprioritized   Arthralgia    Left knee pain, likely OA. Discussed options of adding Tramadol. She prefers not to add medication right now. Encouraged her to ice her knee. Discussed potentially getting a plain xray, however she prefers to  hold off. Follow up prn.      Dermatitis    Scaling rash over bilateral elbows most consistent with psoriasis. Will try adding topical steroid. If no improvement, consider more potent steroid and refer to derm. Also encouraged use of emollient cream.      Gait instability    Encouraged falls prevention with use of cane or Anique Beckley to help with stability.       HTN (hypertension) - Primary    BP Readings from Last 3 Encounters:  10/27/14 122/75  09/13/14 142/78  09/10/14 136/67   BP well controlled. Will plan to continue current medications and recheck renal function with labs in 3 months.          Return in about 3 months (around 01/27/2015) for Recheck.

## 2014-11-03 DIAGNOSIS — H3531 Nonexudative age-related macular degeneration: Secondary | ICD-10-CM | POA: Diagnosis not present

## 2014-11-15 ENCOUNTER — Ambulatory Visit (INDEPENDENT_AMBULATORY_CARE_PROVIDER_SITE_OTHER): Payer: Medicare Other

## 2014-11-15 DIAGNOSIS — E538 Deficiency of other specified B group vitamins: Secondary | ICD-10-CM

## 2014-11-15 MED ORDER — CYANOCOBALAMIN 1000 MCG/ML IJ SOLN
1000.0000 ug | Freq: Once | INTRAMUSCULAR | Status: AC
  Administered 2014-11-15: 1000 ug via INTRAMUSCULAR

## 2014-11-15 NOTE — Progress Notes (Signed)
Patient came in for monthly b12 injection.  Patient received in Left deltoid.  Patient tolerated well.

## 2014-11-17 ENCOUNTER — Other Ambulatory Visit: Payer: Self-pay | Admitting: Internal Medicine

## 2014-12-01 ENCOUNTER — Encounter: Payer: Self-pay | Admitting: Cardiovascular Disease

## 2014-12-01 ENCOUNTER — Ambulatory Visit (INDEPENDENT_AMBULATORY_CARE_PROVIDER_SITE_OTHER): Payer: Medicare Other | Admitting: Cardiovascular Disease

## 2014-12-01 VITALS — BP 142/80 | HR 77 | Ht 59.0 in | Wt 141.0 lb

## 2014-12-01 DIAGNOSIS — I350 Nonrheumatic aortic (valve) stenosis: Secondary | ICD-10-CM | POA: Diagnosis not present

## 2014-12-01 DIAGNOSIS — I6310 Cerebral infarction due to embolism of unspecified precerebral artery: Secondary | ICD-10-CM

## 2014-12-01 DIAGNOSIS — I639 Cerebral infarction, unspecified: Secondary | ICD-10-CM

## 2014-12-01 DIAGNOSIS — I10 Essential (primary) hypertension: Secondary | ICD-10-CM

## 2014-12-01 DIAGNOSIS — I480 Paroxysmal atrial fibrillation: Secondary | ICD-10-CM | POA: Diagnosis not present

## 2014-12-01 NOTE — Progress Notes (Signed)
Patient ID: Dawn Cooper, female    DOB: 1918-04-23, 81 y.o.   MRN: [id number]  HPI Comments: Dawn Cooper is a very pleasant 77 -year-old woman with moderate to severe aortic valve stenosis,  who presented to the hospital on January 02 2011 with  atrial fibrillation with RVR, elevated BNP, CT scan showing bilateral moderate sized pleural effusions who was started on rate control and pradaxa. She refused cardioversion after 4 weeks on anticoagulation. At approximately 6 weeks out, she had a AVM bleed and presented to the hospital with melena and hematocrit of 18. She had been having melena for at least one week and did not go to the hospital. She had EGD with treatment of her AVM. All anticoagulation was held.  We had discussed the case with Dr. Gustavo Lah and it was felt that given her risk of further bleeding from additional AVMs and given her age and the extent of her recent bleed, that no further anticoagulation be used.  She presents today for routine follow-up of her aortic valve stenosis and recent stroke  She was admitted to the hospital 09/10/2014 for stroke This was documented on MRI him a acute stroke on the left She recovered well despite initial slurred speech She presents with friends and family, they report she is doing better now than she was before the stroke, speech is better She feels well with no complaints. Aspirin 81 mg daily was added with Plavix when she was discharged Hospital records were reviewed Currently with no complaints Last echocardiogram 2/ 2016  EKG on today's visit shows atrial fibrillation with left bundle branch block   hospitalization at the end of February 2016 with stroke type symptoms, stroke on MRI Family had reported a mechanical fall, imaging showed pubic ramus fracture, day prior to admission she was found to be unresponsive and would not answer verbal commands. She became more alert during the daytime, slurred speech. They brought her to the hospital  05/19/2014. She is lethargic, bradycardia, hypotension, given IV fluids with improvement of her symptoms. MRI showing multiple strokes, old Felt to be a high risk bleeding candidate, started on Plavix  Echocardiogram in the hospital showed ejection fraction 70%, moderate to severe aortic valve stenosis, moderate MR Carotid ultrasound 05/20/2014 showing moderate bilateral carotid arterial disease worse on the left   Other past medical history   fall in November 2013. She broke her leg and had surgery with a rod placed on the right.   Echocardiogram while she was in atrial fibrillation showed ejection fraction 35-45%, normal left ventricular systolic pressures, moderate aortic valve stenosis with mean gradient of 32 mmHg  Previous lab work:  total cholesterol 132, LDL 82, HDL 40, triglycerides 502, hemoglobin A1c 4.9  Previous CT Scan of the chest showing moderate bilateral pleural effusions with compressive atelectasis in both lower lobes, no PE   Allergies  Allergen Reactions  . Pradaxa [Dabigatran Etexilate Mesylate] Other (See Comments)    Reaction:  Bleeding   . Clonidine Derivatives Itching and Rash  . Penicillins Itching and Rash    Outpatient Encounter Prescriptions as of 12/01/2014  Medication Sig  . aspirin EC 81 MG tablet Take 1 tablet (81 mg total) by mouth daily.  . clopidogrel (PLAVIX) 75 MG tablet Take 1 tablet (75 mg total) by mouth daily.  . furosemide (LASIX) 20 MG tablet Take 1 tablet (20 mg total) by mouth every other day.  . isosorbide mononitrate (IMDUR) 30 MG 24 hr tablet Take 1 tablet (30 mg  total) by mouth daily.  Marland Kitchen lisinopril (PRINIVIL,ZESTRIL) 40 MG tablet Take 0.5 tablets (20 mg total) by mouth daily.  . metoprolol tartrate (LOPRESSOR) 25 MG tablet Take 25 mg by mouth 2 (two) times daily.  . pantoprazole (PROTONIX) 40 MG tablet TAKE 1 TABLET DAILY  . potassium chloride (K-DUR,KLOR-CON) 10 MEQ tablet Take 1 tablet (10 mEq total) by mouth every other day.  .  triamcinolone cream (KENALOG) 0.1 % Apply 1 application topically 2 (two) times daily.   No facility-administered encounter medications on file as of 12/01/2014.    Past Medical History  Diagnosis Date  . Hypertension   . GERD (gastroesophageal reflux disease)   . A-fib   . Mild mitral regurgitation by prior echocardiogram   . Tricuspid regurgitation   . Cardiomyopathy   . Moderate aortic stenosis   . Hiatal hernia   . Broken leg     right  . Stroke   . Heart murmur   . Chronic kidney disease     Past Surgical History  Procedure Laterality Date  . Appendectomy    . Gallbladder surgery    . Cholecystectomy    . Vaginal delivery    . Cataract extraction  2013    left  . Femur fracture surgery  12/2011    Dr. Sabra Heck, s/p rehab at [place]  reports that she has never smoked. She has never used smokeless tobacco. She reports that she does not drink alcohol or use illicit drugs.  Family History family history includes Cancer in her brother and sister; Diabetes in her brother and another family member; Stroke in her sister. There is no history of Heart attack.       Review of Systems  Constitutional: Negative.   Respiratory: Negative.   Cardiovascular: Negative.   Gastrointestinal: Negative.   Musculoskeletal: Positive for gait problem.  Skin: Negative.   Neurological: Negative.        Leg weakness  Hematological: Negative.   Psychiatric/Behavioral: Negative.   All other systems reviewed and are negative.    BP 142/80 mmHg  Pulse 77  Ht 4\' 11"  (1.499 m)  Wt 141 lb (63.957 kg)  BMI 28.46 kg/m2  Physical Exam  Constitutional: She is oriented to person, place, and time. She appears well-developed and well-nourished.  HENT:  Head: Normocephalic.  Nose: Nose normal.  Mouth/Throat: Oropharynx is clear and moist.  Eyes: Conjunctivae are normal. Pupils are equal, round, and reactive to light.  Neck: Normal range of motion. Neck supple. No JVD  present.  Cardiovascular: S1 normal, S2 normal and intact distal pulses.  An irregularly irregular rhythm present. Bradycardia present.  Exam reveals no gallop and no friction rub.   Murmur heard.  Crescendo systolic murmur is present with a grade of 2/6  Pulmonary/Chest: Effort normal and breath sounds normal. No respiratory distress. She has no wheezes. She has no rales. She exhibits no tenderness.  Abdominal: Soft. Bowel sounds are normal. She exhibits no distension. There is no tenderness.  Musculoskeletal: Normal range of motion. She exhibits no edema or tenderness.  Trace edema to the low shins bilaterally  Lymphadenopathy:    She has no cervical adenopathy.  Neurological: She is alert and oriented to person, place, and time. Coordination normal.  Skin: Skin is warm and dry. No rash noted. No erythema.  Psychiatric: She has a normal mood and affect. Her behavior is normal. Judgment and thought content normal.    Assessment and Plan  Nursing note  and vitals reviewed.

## 2014-12-01 NOTE — Assessment & Plan Note (Signed)
EKG shows atrial fibrillation, paroxysmal Not on anticoagulation secondary to significant GI bleed in the past Aspirin added with her Plavix in the hospital This combination places her at high risk of GI bleed. This was discussed with her. Better alternative may be eliquis 2.5 mg twice a day. This was suggested. She prefers to be for medications as they are for now as she is feeling well. Would recommend if she has additional episodes of TIA or stroke, that we changed to eliquis twice a day, lower dose given her age

## 2014-12-01 NOTE — Assessment & Plan Note (Signed)
Blood pressure is well controlled on today's visit. No changes made to the medications. 

## 2014-12-01 NOTE — Assessment & Plan Note (Signed)
Paroxysmal atrial fibrillation, Converted back to atrial fibrillation as seen on EKG today Rate is well controlled. Felt to be a poor candidate for anticoagulation based on previous GI issues But now high risk of bleeding on aspirin and Plavix. Patient prefers to leave her medications as they are for now She will call for any GI bleeding, TIA symptoms

## 2014-12-01 NOTE — Assessment & Plan Note (Signed)
She has recovered well from her CVA as documented on MRI Family reports speech is better than before the stroke

## 2014-12-01 NOTE — Patient Instructions (Signed)
You are doing well. No medication changes were made.  Please call if you have any TIA or stroke symptoms  Please call us if you have new issues that need to be addressed before your next appt.  Your physician wants you to follow-up in: 6 months.  You will receive a reminder letter in the mail two months in advance. If you don't receive a letter, please call our office to schedule the follow-up appointment.

## 2014-12-01 NOTE — Assessment & Plan Note (Signed)
Echocardiogram reviewed from 2/16 Moderate to severe aortic valve stenosis. Medical management at this time. Given her age, poor surgical candidate

## 2014-12-10 ENCOUNTER — Other Ambulatory Visit: Payer: Self-pay | Admitting: Cardiovascular Disease

## 2014-12-20 ENCOUNTER — Ambulatory Visit (INDEPENDENT_AMBULATORY_CARE_PROVIDER_SITE_OTHER): Payer: Medicare Other

## 2014-12-20 DIAGNOSIS — Z23 Encounter for immunization: Secondary | ICD-10-CM

## 2014-12-20 DIAGNOSIS — E538 Deficiency of other specified B group vitamins: Secondary | ICD-10-CM

## 2014-12-20 MED ORDER — CYANOCOBALAMIN 1000 MCG/ML IJ SOLN
1000.0000 ug | Freq: Once | INTRAMUSCULAR | Status: AC
  Administered 2014-12-20: 1000 ug via INTRAMUSCULAR

## 2014-12-20 NOTE — Progress Notes (Signed)
Patient came in for monthly B12 and received in Right deltoid.  Patient tolerated well.  Received flu shot in left deltoid.

## 2015-01-19 ENCOUNTER — Ambulatory Visit (INDEPENDENT_AMBULATORY_CARE_PROVIDER_SITE_OTHER): Payer: Medicare Other | Admitting: Internal Medicine

## 2015-01-19 ENCOUNTER — Ambulatory Visit: Payer: Medicare Other

## 2015-01-19 ENCOUNTER — Encounter: Payer: Self-pay | Admitting: Internal Medicine

## 2015-01-19 VITALS — BP 150/73 | HR 72 | Temp 97.6°F | Ht 59.0 in | Wt 137.5 lb

## 2015-01-19 DIAGNOSIS — I480 Paroxysmal atrial fibrillation: Secondary | ICD-10-CM | POA: Diagnosis not present

## 2015-01-19 DIAGNOSIS — I10 Essential (primary) hypertension: Secondary | ICD-10-CM | POA: Diagnosis not present

## 2015-01-19 DIAGNOSIS — I639 Cerebral infarction, unspecified: Secondary | ICD-10-CM | POA: Diagnosis not present

## 2015-01-19 DIAGNOSIS — N183 Chronic kidney disease, stage 3 unspecified: Secondary | ICD-10-CM

## 2015-01-19 DIAGNOSIS — R5382 Chronic fatigue, unspecified: Secondary | ICD-10-CM | POA: Diagnosis not present

## 2015-01-19 DIAGNOSIS — D649 Anemia, unspecified: Secondary | ICD-10-CM | POA: Diagnosis not present

## 2015-01-19 DIAGNOSIS — R609 Edema, unspecified: Secondary | ICD-10-CM

## 2015-01-19 LAB — COMPREHENSIVE METABOLIC PANEL
ALBUMIN: 3.6 g/dL (ref 3.5–5.2)
ALT: 9 U/L (ref 0–35)
AST: 20 U/L (ref 0–37)
Alkaline Phosphatase: 59 U/L (ref 39–117)
BILIRUBIN TOTAL: 0.6 mg/dL (ref 0.2–1.2)
BUN: 24 mg/dL — ABNORMAL HIGH (ref 6–23)
CALCIUM: 9.9 mg/dL (ref 8.4–10.5)
CHLORIDE: 105 meq/L (ref 96–112)
CO2: 26 mEq/L (ref 19–32)
CREATININE: 1.44 mg/dL — AB (ref 0.40–1.20)
GFR: 36.01 mL/min — ABNORMAL LOW (ref >60.00)
Glucose, Bld: 120 mg/dL — ABNORMAL HIGH (ref 70–99)
Potassium: 4.1 mEq/L (ref 3.5–5.1)
SODIUM: 138 meq/L (ref 135–145)
TOTAL PROTEIN: 7 g/dL (ref 6.0–8.3)

## 2015-01-19 LAB — CBC WITH DIFFERENTIAL/PLATELET
BASOS ABS: 0 10*3/uL (ref 0.0–0.1)
Basophils Relative: 0.1 % (ref 0.0–3.0)
EOS ABS: 0.1 10*3/uL (ref 0.0–0.7)
Eosinophils Relative: 1.3 % (ref 0.0–5.0)
HEMATOCRIT: 33.7 % — AB (ref 36.0–46.0)
HEMOGLOBIN: 11.4 g/dL — AB (ref 12.0–15.0)
LYMPHS PCT: 23.2 % (ref 12.0–46.0)
Lymphs Abs: 1.6 10*3/uL (ref 0.7–4.0)
MCHC: 33.9 g/dL (ref 30.0–36.0)
MCV: 92.7 fl (ref 78.0–100.0)
MONO ABS: 0.5 10*3/uL (ref 0.1–1.0)
Monocytes Relative: 6.9 % (ref 3.0–12.0)
Neutro Abs: 4.6 10*3/uL (ref 1.4–7.7)
Neutrophils Relative %: 68.5 % (ref 43.0–77.0)
Platelets: 198 10*3/uL (ref 150.0–400.0)
RBC: 3.64 Mil/uL — AB (ref 3.87–5.11)
RDW: 14.3 % (ref 11.5–15.5)
WBC: 6.8 10*3/uL (ref 4.0–10.5)

## 2015-01-19 MED ORDER — FUROSEMIDE 20 MG PO TABS: 20.0000 mg | ORAL_TABLET | ORAL | Status: DC

## 2015-01-19 MED ORDER — CYANOCOBALAMIN 1000 MCG/ML IJ SOLN
1000.0000 ug | Freq: Once | INTRAMUSCULAR | Status: AC
  Administered 2015-01-19: 1000 ug via INTRAMUSCULAR

## 2015-01-19 NOTE — Assessment & Plan Note (Signed)
BP Readings from Last 3 Encounters:  01/19/15 150/73  12/01/14 142/80  10/27/14 122/75   BP generally well controlled. Continue current medication. Renal function with labs.

## 2015-01-19 NOTE — Assessment & Plan Note (Signed)
Repeat renal function with labs today. 

## 2015-01-19 NOTE — Assessment & Plan Note (Signed)
NSR on exam today. Reviewed notes from Dr. Rockey Situ. Increased risk bleeding on aspirin and plavix, however declined change to Eliquis. Follow up with Dr. Rockey Situ as scheduled.

## 2015-01-19 NOTE — Patient Instructions (Signed)
Labs today.   Follow up in 3 months.  

## 2015-01-19 NOTE — Assessment & Plan Note (Signed)
Relatively poor energy level and appetite. Will recheck CBC and CMP with labs. Likely multifactorial with chronic health issues and deconditioning. Encouraged her to get outdoors with family members when possible.

## 2015-01-19 NOTE — Progress Notes (Signed)
Subjective:    Patient ID: Dawn Cooper, female    DOB: [birth year], 81 y.o.   MRN: [id number]  HPI  81YO female presents for follow up.  Recently seen by Dr. Rockey Situ. Discussion of changing to Eliquis from Plavix, however decision made to stick with current regimen.  AFIB - Feeling well. No chest pain, palpitations, dyspnea. Compliant with medication.  Appetite has been poor. No NV. No abdominal pain. No bloody or black stool. Prefers not to use medication to help with appetite. Trying not to gain any weight back. Energy level is low. Notes that family has been limited in time to take her out. No recent falls.  Wt Readings from Last 3 Encounters:  01/19/15 137 lb 8 oz (62.37 kg)  12/01/14 141 lb (63.957 kg)  10/27/14 137 lb 2 oz (62.199 kg)   BP Readings from Last 3 Encounters:  01/19/15 150/73  12/01/14 142/80  10/27/14 122/75    Past Medical History  Diagnosis Date  . Hypertension   . GERD (gastroesophageal reflux disease)   . A-fib ([place])   . Mild mitral regurgitation by prior echocardiogram   . Tricuspid regurgitation   . Cardiomyopathy   . Moderate aortic stenosis   . Hiatal hernia   . Broken leg     right  . Stroke ([place])   . Heart murmur   . Chronic kidney disease    Family History  Problem Relation Age of Onset  . Stroke Sister   . Cancer Brother     pancreatic  . Cancer Sister     pancreatic  . Diabetes    . Heart attack Neg Hx   . Diabetes Brother    Past Surgical History  Procedure Laterality Date  . Appendectomy    . Gallbladder surgery    . Cholecystectomy    . Vaginal delivery    . Cataract extraction  2013    left  . Femur fracture surgery  12/2011    Dr. Sabra Heck, s/p rehab at [place] History  . Marital Status: Widowed    Spouse Name: N/A  . Number of Children: N/A  . Years of Education: N/A   Social History Main Topics  . Smoking status: Never Smoker   . Smokeless tobacco: Never Used  . Alcohol  Use: No  . Drug Use: No  . Sexual Activity: Not Asked   Other Topics Concern  . None   Social History Narrative   Lives in [place] alone. 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    Review of Systems  Constitutional: Positive for appetite change and fatigue. Negative for fever, chills and unexpected weight change.  Eyes: Negative for visual disturbance.  Respiratory: Negative for chest tightness, shortness of breath and wheezing.   Cardiovascular: Negative for chest pain, palpitations and leg swelling.  Gastrointestinal: Negative for nausea, vomiting, abdominal pain, diarrhea, constipation and blood in stool.  Musculoskeletal: Negative for arthralgias.  Skin: Negative for color change and rash.  Hematological: Negative for adenopathy. Does not bruise/bleed easily.  Psychiatric/Behavioral: Negative for sleep disturbance and dysphoric mood. The patient is not nervous/anxious.        Objective:    BP 150/73 mmHg  Pulse 72  Temp(Src) 97.6 F (36.4 C) (Oral)  Ht 4\' 11"  (1.499 m)  Wt 137 lb 8 oz (62.37 kg)  BMI 27.76 kg/m2  SpO2 98% Physical Exam  Constitutional: She is oriented to person, place, and time. She appears well-developed and well-nourished. No  distress.  HENT:  Head: Normocephalic and atraumatic.  Right Ear: External ear normal.  Left Ear: External ear normal.  Nose: Nose normal.  Mouth/Throat: Oropharynx is clear and moist. No oropharyngeal exudate.  Eyes: Conjunctivae are normal. Pupils are equal, round, and reactive to light. Right eye exhibits no discharge. Left eye exhibits no discharge. No scleral icterus.  Neck: Normal range of motion. Neck supple. No tracheal deviation present. No thyromegaly present.  Cardiovascular: Normal rate, regular rhythm and intact distal pulses.  Exam reveals no gallop and no friction rub.   Murmur heard. Pulmonary/Chest: Effort normal and breath sounds normal. No respiratory distress. She has no wheezes. She has no rales. She exhibits  no tenderness.  Musculoskeletal: Normal range of motion. She exhibits no edema or tenderness.  Lymphadenopathy:    She has no cervical adenopathy.  Neurological: She is alert and oriented to person, place, and time. No cranial nerve deficit. She exhibits normal muscle tone. Coordination normal.  Skin: Skin is warm and dry. No rash noted. She is not diaphoretic. No erythema. No pallor.  Psychiatric: She has a normal mood and affect. Her behavior is normal. Judgment and thought content normal.          Assessment & Plan:   Problem List Items Addressed This Visit      Unprioritized   Anemia    Repeat CBC with labs today.      Relevant Medications   cyanocobalamin ((VITAMIN B-12)) injection 1,000 mcg   Other Relevant Orders   CBC with Differential/Platelet   Atrial fibrillation (HCC) - Primary    NSR on exam today. Reviewed notes from Dr. Rockey Situ. Increased risk bleeding on aspirin and plavix, however declined change to Eliquis. Follow up with Dr. Rockey Situ as scheduled.      Relevant Medications   furosemide (LASIX) 20 MG tablet   Chronic fatigue    Relatively poor energy level and appetite. Will recheck CBC and CMP with labs. Likely multifactorial with chronic health issues and deconditioning. Encouraged her to get outdoors with family members when possible.      Relevant Medications   cyanocobalamin ((VITAMIN B-12)) injection 1,000 mcg   CKD (chronic kidney disease), stage III    Repeat renal function with labs today.      Edema    No edema noted today on exam. Will continue prn Furosemide.      Relevant Medications   furosemide (LASIX) 20 MG tablet   HTN (hypertension)    BP Readings from Last 3 Encounters:  01/19/15 150/73  12/01/14 142/80  10/27/14 122/75   BP generally well controlled. Continue current medication. Renal function with labs.      Relevant Medications   furosemide (LASIX) 20 MG tablet   Other Relevant Orders   Comprehensive metabolic panel         Return in about 3 months (around 04/21/2015) for Recheck.

## 2015-01-19 NOTE — Progress Notes (Signed)
Pre visit review using our clinic review tool, if applicable. No additional management support is needed unless otherwise documented below in the visit note. 

## 2015-01-19 NOTE — Assessment & Plan Note (Signed)
Repeat CBC with labs today. 

## 2015-01-19 NOTE — Assessment & Plan Note (Signed)
No edema noted today on exam. Will continue prn Furosemide.

## 2015-01-23 ENCOUNTER — Encounter: Payer: Self-pay | Admitting: *Deleted

## 2015-01-27 ENCOUNTER — Ambulatory Visit: Payer: Medicare Other | Admitting: Internal Medicine

## 2015-02-21 ENCOUNTER — Ambulatory Visit (INDEPENDENT_AMBULATORY_CARE_PROVIDER_SITE_OTHER): Payer: Medicare Other | Admitting: *Deleted

## 2015-02-21 DIAGNOSIS — D510 Vitamin B12 deficiency anemia due to intrinsic factor deficiency: Secondary | ICD-10-CM

## 2015-02-21 MED ORDER — CYANOCOBALAMIN 1000 MCG/ML IJ SOLN
1000.0000 ug | Freq: Once | INTRAMUSCULAR | Status: AC
  Administered 2015-02-21: 1000 ug via INTRAMUSCULAR

## 2015-02-21 NOTE — Progress Notes (Signed)
Patient presented for b 12 injection in right deltoid and tolerated injection well.

## 2015-03-18 DIAGNOSIS — M6281 Muscle weakness (generalized): Secondary | ICD-10-CM | POA: Diagnosis not present

## 2015-03-22 ENCOUNTER — Encounter: Payer: Self-pay | Admitting: *Deleted

## 2015-03-28 ENCOUNTER — Ambulatory Visit (INDEPENDENT_AMBULATORY_CARE_PROVIDER_SITE_OTHER): Payer: Medicare Other | Admitting: *Deleted

## 2015-03-28 DIAGNOSIS — D510 Vitamin B12 deficiency anemia due to intrinsic factor deficiency: Secondary | ICD-10-CM

## 2015-03-28 MED ORDER — CYANOCOBALAMIN 1000 MCG/ML IJ SOLN
1000.0000 ug | Freq: Once | INTRAMUSCULAR | Status: AC
  Administered 2015-03-28: 1000 ug via INTRAMUSCULAR

## 2015-03-28 NOTE — Progress Notes (Signed)
Patient presented for B12 injection to left deltoid and tolerated well. 

## 2015-04-21 ENCOUNTER — Ambulatory Visit (INDEPENDENT_AMBULATORY_CARE_PROVIDER_SITE_OTHER): Payer: Medicare Other | Admitting: Internal Medicine

## 2015-04-21 ENCOUNTER — Encounter: Payer: Self-pay | Admitting: Internal Medicine

## 2015-04-21 VITALS — BP 136/70 | HR 76 | Temp 97.5°F | Ht 59.0 in | Wt 134.5 lb

## 2015-04-21 DIAGNOSIS — D649 Anemia, unspecified: Secondary | ICD-10-CM

## 2015-04-21 DIAGNOSIS — N183 Chronic kidney disease, stage 3 unspecified: Secondary | ICD-10-CM

## 2015-04-21 DIAGNOSIS — I350 Nonrheumatic aortic (valve) stenosis: Secondary | ICD-10-CM | POA: Diagnosis not present

## 2015-04-21 DIAGNOSIS — I480 Paroxysmal atrial fibrillation: Secondary | ICD-10-CM

## 2015-04-21 DIAGNOSIS — I10 Essential (primary) hypertension: Secondary | ICD-10-CM

## 2015-04-21 LAB — CBC WITH DIFFERENTIAL/PLATELET
Basophils Absolute: 0 10*3/uL (ref 0.0–0.1)
Basophils Relative: 0.5 % (ref 0.0–3.0)
EOS PCT: 3.5 % (ref 0.0–5.0)
Eosinophils Absolute: 0.2 10*3/uL (ref 0.0–0.7)
HCT: 36.9 % (ref 36.0–46.0)
Hemoglobin: 12.2 g/dL (ref 12.0–15.0)
LYMPHS ABS: 1.8 10*3/uL (ref 0.7–4.0)
Lymphocytes Relative: 31.3 % (ref 12.0–46.0)
MCHC: 33.2 g/dL (ref 30.0–36.0)
MCV: 90.4 fl (ref 78.0–100.0)
MONOS PCT: 9.4 % (ref 3.0–12.0)
Monocytes Absolute: 0.5 10*3/uL (ref 0.1–1.0)
NEUTROS ABS: 3.2 10*3/uL (ref 1.4–7.7)
Neutrophils Relative %: 55.3 % (ref 43.0–77.0)
PLATELETS: 214 10*3/uL (ref 150.0–400.0)
RBC: 4.08 Mil/uL (ref 3.87–5.11)
RDW: 14 % (ref 11.5–15.5)
WBC: 5.7 10*3/uL (ref 4.0–10.5)

## 2015-04-21 LAB — COMPREHENSIVE METABOLIC PANEL
ALT: 9 U/L (ref 0–35)
AST: 18 U/L (ref 0–37)
Albumin: 3.6 g/dL (ref 3.5–5.2)
Alkaline Phosphatase: 60 U/L (ref 39–117)
BILIRUBIN TOTAL: 0.8 mg/dL (ref 0.2–1.2)
BUN: 23 mg/dL (ref 6–23)
CO2: 29 meq/L (ref 19–32)
Calcium: 9.5 mg/dL (ref 8.4–10.5)
Chloride: 104 mEq/L (ref 96–112)
Creatinine, Ser: 1.75 mg/dL — ABNORMAL HIGH (ref 0.40–1.20)
GFR: 28.74 mL/min — AB (ref >60.00)
Glucose, Bld: 113 mg/dL — ABNORMAL HIGH (ref 70–99)
POTASSIUM: 4.1 meq/L (ref 3.5–5.1)
Sodium: 139 mEq/L (ref 135–145)
Total Protein: 6.8 g/dL (ref 6.0–8.3)

## 2015-04-21 MED ORDER — LISINOPRIL 40 MG PO TABS
20.0000 mg | ORAL_TABLET | Freq: Every day | ORAL | Status: DC
Start: 2015-04-21 — End: 2015-10-20

## 2015-04-21 NOTE — Assessment & Plan Note (Signed)
BP Readings from Last 3 Encounters:  04/21/15 136/70  01/19/15 150/73  12/01/14 142/80   BP well controlled. Renal function with labs. Continue current meds.

## 2015-04-21 NOTE — Progress Notes (Signed)
Subjective:    Patient ID: Dawn Cooper, female    DOB: [birth year], 82 y.o.   MRN: [id number]  HPI  82YO female presents for follow up.  Christmas Eve, had low BP. Called EMS, and symptoms resolved. No recurrent symptoms. Since then feeling well. No chest pain, dyspnea.  No concerns today. Compliant with medication. No recent falls at home. Using cane. Son is helping with her care. Appetite good. No trouble swallowing. No NVD.  Wt Readings from Last 3 Encounters:  04/21/15 134 lb 8 oz (61.009 kg)  01/19/15 137 lb 8 oz (62.37 kg)  12/01/14 141 lb (63.957 kg)   BP Readings from Last 3 Encounters:  04/21/15 136/70  01/19/15 150/73  12/01/14 142/80    Past Medical History  Diagnosis Date  . Hypertension   . GERD (gastroesophageal reflux disease)   . A-fib ([place])   . Mild mitral regurgitation by prior echocardiogram   . Tricuspid regurgitation   . Cardiomyopathy   . Moderate aortic stenosis   . Hiatal hernia   . Broken leg     right  . Stroke ([place])   . Heart murmur   . Chronic kidney disease    Family History  Problem Relation Age of Onset  . Stroke Sister   . Cancer Brother     pancreatic  . Cancer Sister     pancreatic  . Diabetes    . Heart attack Neg Hx   . Diabetes Brother    Past Surgical History  Procedure Laterality Date  . Appendectomy    . Gallbladder surgery    . Cholecystectomy    . Vaginal delivery    . Cataract extraction  2013    left  . Femur fracture surgery  12/2011    Dr. Sabra Heck, s/p rehab at [facility] History  . Marital Status: Widowed    Spouse Name: N/A  . Number of Children: N/A  . Years of Education: N/A   Social History Main Topics  . Smoking status: Never Smoker   . Smokeless tobacco: Never Used  . Alcohol Use: No  . Drug Use: No  . Sexual Activity: Not Asked   Other Topics Concern  . None   Social History Narrative   Lives in [place] alone. 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     Review of Systems  Constitutional: Negative for fever, chills, appetite change, fatigue and unexpected weight change.  Eyes: Negative for visual disturbance.  Respiratory: Negative for cough, shortness of breath and wheezing.   Cardiovascular: Negative for chest pain and leg swelling.  Gastrointestinal: Negative for nausea, vomiting, abdominal pain, diarrhea and constipation.  Musculoskeletal: Negative for myalgias and arthralgias.  Skin: Negative for color change and rash.  Hematological: Negative for adenopathy. Does not bruise/bleed easily.  Psychiatric/Behavioral: Negative for sleep disturbance and dysphoric mood. The patient is not nervous/anxious.        Objective:    BP 136/70 mmHg  Pulse 76  Temp(Src) 97.5 F (36.4 C) (Oral)  Ht 4\' 11"  (1.499 m)  Wt 134 lb 8 oz (61.009 kg)  BMI 27.15 kg/m2  SpO2 99% Physical Exam  Constitutional: She is oriented to person, place, and time. She appears well-developed and well-nourished. No distress.  HENT:  Head: Normocephalic and atraumatic.  Right Ear: External ear normal.  Left Ear: External ear normal.  Nose: Nose normal.  Mouth/Throat: Oropharynx is clear and moist. No oropharyngeal exudate.  Eyes: Conjunctivae are normal. Pupils are  equal, round, and reactive to light. Right eye exhibits no discharge. Left eye exhibits no discharge. No scleral icterus.  Neck: Normal range of motion. Neck supple. No tracheal deviation present. No thyromegaly present.  Cardiovascular: Normal rate, regular rhythm and intact distal pulses.  Exam reveals no gallop and no friction rub.   Murmur heard.  Systolic murmur is present  Pulmonary/Chest: Effort normal and breath sounds normal. No respiratory distress. She has no wheezes. She has no rales. She exhibits no tenderness.  Musculoskeletal: Normal range of motion. She exhibits no edema or tenderness.  Lymphadenopathy:    She has no cervical adenopathy.  Neurological: She is alert and oriented to  person, place, and time. No cranial nerve deficit. She exhibits normal muscle tone. Coordination normal.  Skin: Skin is warm and dry. No rash noted. She is not diaphoretic. No erythema. No pallor.  Psychiatric: She has a normal mood and affect. Her behavior is normal. Judgment and thought content normal.          Assessment & Plan:   Problem List Items Addressed This Visit      Unprioritized   Anemia    Repeat CBC with labs today.      Relevant Orders   CBC with Differential/Platelet   Aortic valve stenosis, severe    Symptomatically doing well. Follow up with Dr. Rockey Situ pending for 05/2014.      Relevant Medications   lisinopril (PRINIVIL,ZESTRIL) 40 MG tablet   Atrial fibrillation (HCC) - Primary    Regular rhythm on exam today. Continue current medications. She declined anticoagulation with Eliquis in the past. Continues on Aspirin and Plavix.      Relevant Medications   lisinopril (PRINIVIL,ZESTRIL) 40 MG tablet   CKD (chronic kidney disease), stage III    Renal function with labs.      Relevant Orders   Comprehensive metabolic panel   HTN (hypertension)    BP Readings from Last 3 Encounters:  04/21/15 136/70  01/19/15 150/73  12/01/14 142/80   BP well controlled. Renal function with labs. Continue current meds.      Relevant Medications   lisinopril (PRINIVIL,ZESTRIL) 40 MG tablet       Return in about 3 months (around 07/20/2015) for Recheck.

## 2015-04-21 NOTE — Assessment & Plan Note (Signed)
Symptomatically doing well. Follow up with Dr. Rockey Situ pending for 05/2014.

## 2015-04-21 NOTE — Assessment & Plan Note (Signed)
Renal function with labs. 

## 2015-04-21 NOTE — Assessment & Plan Note (Signed)
Regular rhythm on exam today. Continue current medications. She declined anticoagulation with Eliquis in the past. Continues on Aspirin and Plavix.

## 2015-04-21 NOTE — Patient Instructions (Signed)
Labs today.  Follow up in 3 months and sooner as needed. 

## 2015-04-21 NOTE — Assessment & Plan Note (Signed)
Repeat CBC with labs today. 

## 2015-04-21 NOTE — Progress Notes (Signed)
Pre visit review using our clinic review tool, if applicable. No additional management support is needed unless otherwise documented below in the visit note. 

## 2015-05-02 ENCOUNTER — Ambulatory Visit (INDEPENDENT_AMBULATORY_CARE_PROVIDER_SITE_OTHER): Payer: Medicare Other

## 2015-05-02 DIAGNOSIS — E538 Deficiency of other specified B group vitamins: Secondary | ICD-10-CM | POA: Diagnosis not present

## 2015-05-02 MED ORDER — CYANOCOBALAMIN 1000 MCG/ML IJ SOLN
1000.0000 ug | Freq: Once | INTRAMUSCULAR | Status: AC
  Administered 2015-05-02: 1000 ug via INTRAMUSCULAR

## 2015-05-02 NOTE — Progress Notes (Signed)
Patient came in for B12 injection.  Received in Right deltoid.  Patient tolerated well.  

## 2015-05-13 ENCOUNTER — Other Ambulatory Visit: Payer: Self-pay | Admitting: Cardiovascular Disease

## 2015-05-29 ENCOUNTER — Encounter: Payer: Self-pay | Admitting: Cardiovascular Disease

## 2015-05-29 ENCOUNTER — Ambulatory Visit (INDEPENDENT_AMBULATORY_CARE_PROVIDER_SITE_OTHER): Payer: Medicare Other | Admitting: Cardiovascular Disease

## 2015-05-29 VITALS — BP 150/80 | HR 63 | Ht 64.0 in | Wt 135.2 lb

## 2015-05-29 DIAGNOSIS — I480 Paroxysmal atrial fibrillation: Secondary | ICD-10-CM | POA: Diagnosis not present

## 2015-05-29 DIAGNOSIS — I350 Nonrheumatic aortic (valve) stenosis: Secondary | ICD-10-CM

## 2015-05-29 DIAGNOSIS — I6310 Cerebral infarction due to embolism of unspecified precerebral artery: Secondary | ICD-10-CM

## 2015-05-29 DIAGNOSIS — R5382 Chronic fatigue, unspecified: Secondary | ICD-10-CM

## 2015-05-29 NOTE — Patient Instructions (Signed)
You are doing well.  Drink a little more fluids, Skip the lasix on the weekends  Please call us if you have new issues that need to be addressed before your next appt.  Your physician wants you to follow-up in: 6 months.  You will receive a reminder letter in the mail two months in advance. If you don't receive a letter, please call our office to schedule the follow-up appointment.

## 2015-05-29 NOTE — Assessment & Plan Note (Signed)
Recommended we consider repeat echocardiogram in follow-up visit later this year  Prior echocardiogram early 2016 showed more moderate  To severe aortic valve stenosis with peak pressure gradient in the high 50s

## 2015-05-29 NOTE — Assessment & Plan Note (Signed)
Heart rate relatively well-controlled on her current medication regimen. No changes made  anticoagulation discussion as above

## 2015-05-29 NOTE — Assessment & Plan Note (Signed)
Recovered from her stroke, no further stroke or TIA symptoms  She does not want other forms of anticoagulation. Again these were discussed with her including risk and benefit  Family was with her today for discussion.  She is happy to continue on aspirin and Plavix despite suboptimal therapy.

## 2015-05-29 NOTE — Progress Notes (Signed)
Patient ID: Dawn Cooper, female    DOB: 1918-04-03, 82 y.o.   MRN: [id number]  HPI Comments: Dawn Cooper is a very pleasant 82 -year-old woman with moderate to severe aortic valve stenosis,  who presented to the hospital on January 02 2011 with  atrial fibrillation with RVR, elevated BNP, CT scan showing bilateral moderate sized pleural effusions who was started on rate control and pradaxa. Dawn Cooper refused cardioversion after 4 weeks on anticoagulation. At approximately 6 weeks out, Dawn Cooper had a AVM bleed and presented to the hospital with melena and hematocrit of 18. Dawn Cooper had been having melena for at least one week and did not go to the hospital. Dawn Cooper had EGD with treatment of her AVM. All anticoagulation was held.  We had discussed the case with Dr. Gustavo Lah and it was felt that given her risk of further bleeding from additional AVMs and given her age and the extent of her recent bleed, that no further anticoagulation be used.  Dawn Cooper presents today for routine follow-up of her aortic valve stenosis and  History of stroke   in follow-up today, Dawn Cooper denies any TIA or stroke type symptoms  Dawn Cooper reports that Dawn Cooper is doing well on aspirin and Plavix , again does not want stronger blood thinners as Dawn Cooper is doing well  no regular exercise, legs are getting weaker  Reports that Dawn Cooper is not sleeping well, energy is poor   EKG on today's visit showing atrial fibrillation with left bundle branch block, rate in the 60s   other past medical history admitted to the hospital 09/10/2014 for stroke,  acute stroke on the left  On MRI Dawn Cooper recovered well despite initial slurred speech  Last echocardiogram 2/ 2016   hospitalization at the end of February 2016 with stroke type symptoms, stroke on MRI Family had reported a mechanical fall, imaging showed pubic ramus fracture, day prior to admission Dawn Cooper was found to be unresponsive and would not answer verbal commands. Dawn Cooper became more alert during the daytime, slurred speech.  They brought her to the hospital 05/19/2014. Dawn Cooper is lethargic, bradycardia, hypotension, given IV fluids with improvement of her symptoms. MRI showing multiple strokes, old Felt to be a high risk bleeding candidate, started on Plavix  Echocardiogram in the hospital showed ejection fraction 70%, moderate to severe aortic valve stenosis, moderate MR Carotid ultrasound 05/20/2014 showing moderate bilateral carotid arterial disease worse on the left    fall in November 2013. Dawn Cooper broke her leg and had surgery with a rod placed on the right.   Echocardiogram while Dawn Cooper was in atrial fibrillation showed ejection fraction 35-45%, normal left ventricular systolic pressures, moderate aortic valve stenosis with mean gradient of 32 mmHg  Previous lab work:  total cholesterol 132, LDL 82, HDL 40, triglycerides 502, hemoglobin A1c 4.9  Previous CT Scan of the chest showing moderate bilateral pleural effusions with compressive atelectasis in both lower lobes, no PE   Allergies  Allergen Reactions  . Pradaxa [Dabigatran Etexilate Mesylate] Other (See Comments)    Reaction:  Bleeding   . Clonidine Derivatives Itching and Rash  . Penicillins Itching and Rash    Outpatient Encounter Prescriptions as of 05/29/2015  Medication Sig  . aspirin EC 81 MG tablet Take 1 tablet (81 mg total) by mouth daily.  . clopidogrel (PLAVIX) 75 MG tablet Take 1 tablet (75 mg total) by mouth daily.  . furosemide (LASIX) 20 MG tablet Take 1 tablet (20 mg total) by mouth every other day.  . isosorbide  mononitrate (IMDUR) 30 MG 24 hr tablet TAKE 1 TABLET EVERY DAY  . lisinopril (PRINIVIL,ZESTRIL) 40 MG tablet Take 0.5 tablets (20 mg total) by mouth daily.  . metoprolol tartrate (LOPRESSOR) 25 MG tablet TAKE 1 TABLET TWICE DAILY  . pantoprazole (PROTONIX) 40 MG tablet TAKE 1 TABLET DAILY  . potassium chloride (K-DUR,KLOR-CON) 10 MEQ tablet Take 1 tablet (10 mEq total) by mouth every other day.  . triamcinolone cream (KENALOG)  0.1 % Apply 1 application topically 2 (two) times daily.   No facility-administered encounter medications on file as of 05/29/2015.    Past Medical History  Diagnosis Date  . Hypertension   . GERD (gastroesophageal reflux disease)   . A-fib ([place])   . Mild mitral regurgitation by prior echocardiogram   . Tricuspid regurgitation   . Cardiomyopathy   . Moderate aortic stenosis   . Hiatal hernia   . Broken leg     right  . Stroke ([place])   . Heart murmur   . Chronic kidney disease     Past Surgical History  Procedure Laterality Date  . Appendectomy    . Gallbladder surgery    . Cholecystectomy    . Vaginal delivery    . Cataract extraction  2013    left  . Femur fracture surgery  12/2011    Dr. Sabra Heck, s/p rehab at [facility]  reports that Dawn Cooper has never smoked. Dawn Cooper has never used smokeless tobacco. Dawn Cooper reports that Dawn Cooper does not drink alcohol or use illicit drugs.  Family History family history includes Cancer in her brother and sister; Diabetes in her brother; Stroke in her sister. There is no history of Heart attack.       Review of Systems  Constitutional: Positive for fatigue.  Respiratory: Negative.   Cardiovascular: Negative.   Gastrointestinal: Negative.   Musculoskeletal: Positive for gait problem.  Skin: Negative.   Neurological: Negative.        Leg weakness  Hematological: Negative.   Psychiatric/Behavioral: Positive for sleep disturbance.  All other systems reviewed and are negative.    BP 150/80 mmHg  Pulse 63  Ht 5\' 4"  (1.626 m)  Wt 135 lb 4 oz (61.349 kg)  BMI 23.20 kg/m2  Physical Exam  Constitutional: Dawn Cooper is oriented to person, place, and time. Dawn Cooper appears well-developed and well-nourished.  HENT:  Head: Normocephalic.  Nose: Nose normal.  Mouth/Throat: Oropharynx is clear and moist.  Eyes: Conjunctivae are normal. Pupils are equal, round, and reactive to light.  Neck: Normal range of motion. Neck supple. No JVD present.   Cardiovascular: S1 normal, S2 normal and intact distal pulses.  An irregularly irregular rhythm present. Bradycardia present.  Exam reveals no gallop and no friction rub.   Murmur heard.  Crescendo systolic murmur is present with a grade of 2/6  Pulmonary/Chest: Effort normal and breath sounds normal. No respiratory distress. Dawn Cooper has no wheezes. Dawn Cooper has no rales. Dawn Cooper exhibits no tenderness.  Abdominal: Soft. Bowel sounds are normal. Dawn Cooper exhibits no distension. There is no tenderness.  Musculoskeletal: Normal range of motion. Dawn Cooper exhibits no edema or tenderness.  Trace edema to the low shins bilaterally  Lymphadenopathy:    Dawn Cooper has no cervical adenopathy.  Neurological: Dawn Cooper is alert and oriented to person, place, and time. Coordination normal.  Skin: Skin is warm and dry. No rash noted. No erythema.  Psychiatric: Dawn Cooper has a normal mood and affect. Her behavior is normal. Judgment and thought content normal.  Assessment and Plan  Nursing note and vitals reviewed.

## 2015-05-29 NOTE — Assessment & Plan Note (Signed)
Not sleeping well, possibly contributing to her fatigue. Recommended some daily walking

## 2015-05-30 ENCOUNTER — Ambulatory Visit (INDEPENDENT_AMBULATORY_CARE_PROVIDER_SITE_OTHER): Payer: Medicare Other

## 2015-05-30 DIAGNOSIS — E538 Deficiency of other specified B group vitamins: Secondary | ICD-10-CM

## 2015-05-30 MED ORDER — CYANOCOBALAMIN 1000 MCG/ML IJ SOLN
1000.0000 ug | Freq: Once | INTRAMUSCULAR | Status: AC
  Administered 2015-05-30: 1000 ug via INTRAMUSCULAR

## 2015-05-30 NOTE — Progress Notes (Signed)
Patient came in for a b12 injection.  Received in left deltoid.  Patient tolerated well.  

## 2015-07-04 ENCOUNTER — Ambulatory Visit (INDEPENDENT_AMBULATORY_CARE_PROVIDER_SITE_OTHER): Payer: Medicare Other

## 2015-07-04 DIAGNOSIS — E538 Deficiency of other specified B group vitamins: Secondary | ICD-10-CM

## 2015-07-04 MED ORDER — CYANOCOBALAMIN 1000 MCG/ML IJ SOLN
1000.0000 ug | Freq: Once | INTRAMUSCULAR | Status: AC
  Administered 2015-07-04: 1000 ug via INTRAMUSCULAR

## 2015-07-04 NOTE — Progress Notes (Signed)
Patient was in today receiving a B-12 injection in the right deltoid. Patient tolerated well.

## 2015-07-21 ENCOUNTER — Ambulatory Visit (INDEPENDENT_AMBULATORY_CARE_PROVIDER_SITE_OTHER): Payer: Medicare Other | Admitting: Internal Medicine

## 2015-07-21 ENCOUNTER — Encounter: Payer: Self-pay | Admitting: Internal Medicine

## 2015-07-21 VITALS — BP 110/72 | HR 77 | Temp 97.8°F | Ht 64.0 in | Wt 137.0 lb

## 2015-07-21 DIAGNOSIS — I10 Essential (primary) hypertension: Secondary | ICD-10-CM

## 2015-07-21 DIAGNOSIS — G4700 Insomnia, unspecified: Secondary | ICD-10-CM | POA: Insufficient documentation

## 2015-07-21 DIAGNOSIS — N183 Chronic kidney disease, stage 3 unspecified: Secondary | ICD-10-CM

## 2015-07-21 DIAGNOSIS — I480 Paroxysmal atrial fibrillation: Secondary | ICD-10-CM

## 2015-07-21 DIAGNOSIS — I6310 Cerebral infarction due to embolism of unspecified precerebral artery: Secondary | ICD-10-CM

## 2015-07-21 LAB — CBC WITH DIFFERENTIAL/PLATELET
BASOS PCT: 0.5 % (ref 0.0–3.0)
Basophils Absolute: 0 10*3/uL (ref 0.0–0.1)
EOS ABS: 0.2 10*3/uL (ref 0.0–0.7)
Eosinophils Relative: 2.6 % (ref 0.0–5.0)
HCT: 34.5 % — ABNORMAL LOW (ref 36.0–46.0)
Hemoglobin: 11.7 g/dL — ABNORMAL LOW (ref 12.0–15.0)
Lymphocytes Relative: 23.2 % (ref 12.0–46.0)
Lymphs Abs: 1.4 10*3/uL (ref 0.7–4.0)
MCHC: 33.9 g/dL (ref 30.0–36.0)
MCV: 90.6 fl (ref 78.0–100.0)
MONO ABS: 0.4 10*3/uL (ref 0.1–1.0)
Monocytes Relative: 7.2 % (ref 3.0–12.0)
NEUTROS ABS: 4.1 10*3/uL (ref 1.4–7.7)
Neutrophils Relative %: 66.5 % (ref 43.0–77.0)
PLATELETS: 194 10*3/uL (ref 150.0–400.0)
RBC: 3.81 Mil/uL — ABNORMAL LOW (ref 3.87–5.11)
RDW: 14.7 % (ref 11.5–15.5)
WBC: 6.1 10*3/uL (ref 4.0–10.5)

## 2015-07-21 LAB — COMPREHENSIVE METABOLIC PANEL
ALT: 10 U/L (ref 0–35)
AST: 20 U/L (ref 0–37)
Albumin: 3.5 g/dL (ref 3.5–5.2)
Alkaline Phosphatase: 58 U/L (ref 39–117)
BUN: 19 mg/dL (ref 6–23)
CALCIUM: 9.7 mg/dL (ref 8.4–10.5)
CHLORIDE: 108 meq/L (ref 96–112)
CO2: 26 meq/L (ref 19–32)
CREATININE: 1.44 mg/dL — AB (ref 0.40–1.20)
GFR: 35.97 mL/min — ABNORMAL LOW (ref >60.00)
Glucose, Bld: 133 mg/dL — ABNORMAL HIGH (ref 70–99)
POTASSIUM: 4.2 meq/L (ref 3.5–5.1)
SODIUM: 140 meq/L (ref 135–145)
Total Bilirubin: 0.5 mg/dL (ref 0.2–1.2)
Total Protein: 6.7 g/dL (ref 6.0–8.3)

## 2015-07-21 NOTE — Progress Notes (Signed)
Pre visit review using our clinic review tool, if applicable. No additional management support is needed unless otherwise documented below in the visit note. 

## 2015-07-21 NOTE — Assessment & Plan Note (Signed)
BP Readings from Last 3 Encounters:  07/21/15 110/72  05/29/15 150/80  04/21/15 136/70   BP low side today. Discussed monitoring and potentially limiting Lisinopril to 10mg  daily if BP persistently low.

## 2015-07-21 NOTE — Progress Notes (Signed)
Subjective:    Patient ID: Dawn Cooper, female    DOB: 11/07/1918, 82 y.o.   MRN: [id number]  HPI  82YO female presents for follow up.  Rough time for her. Daughter in law and nephew passed away.  Compliant with medications. Limiting furosemide to every other day.  No, CP, HA, dyspnea.   Insomnia - has trouble falling asleep at times. Takes occasional Tylenol PM with some improvement.  Wt Readings from Last 3 Encounters:  07/21/15 137 lb (62.143 kg)  05/29/15 135 lb 4 oz (61.349 kg)  04/21/15 134 lb 8 oz (61.009 kg)   BP Readings from Last 3 Encounters:  07/21/15 110/72  05/29/15 150/80  04/21/15 136/70    Past Medical History  Diagnosis Date  . Hypertension   . GERD (gastroesophageal reflux disease)   . A-fib ([place])   . Mild mitral regurgitation by prior echocardiogram   . Tricuspid regurgitation   . Cardiomyopathy   . Moderate aortic stenosis   . Hiatal hernia   . Broken leg     right  . Stroke ([place])   . Heart murmur   . Chronic kidney disease    Family History  Problem Relation Age of Onset  . Stroke Sister   . Cancer Brother     pancreatic  . Cancer Sister     pancreatic  . Diabetes    . Heart attack Neg Hx   . Diabetes Brother    Past Surgical History  Procedure Laterality Date  . Appendectomy    . Gallbladder surgery    . Cholecystectomy    . Vaginal delivery    . Cataract extraction  2013    left  . Femur fracture surgery  12/2011    Dr. Sabra Heck, s/p rehab at [facility] History  . Marital Status: Widowed    Spouse Name: N/A  . Number of Children: N/A  . Years of Education: N/A   Social History Main Topics  . Smoking status: Never Smoker   . Smokeless tobacco: Never Used  . Alcohol Use: No  . Drug Use: No  . Sexual Activity: Not Asked   Other Topics Concern  . None   Social History Narrative   Lives in [place] alone. 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    Review of Systems  Constitutional:  Negative for fever, chills, appetite change, fatigue and unexpected weight change.  Eyes: Negative for visual disturbance.  Respiratory: Negative for shortness of breath.   Cardiovascular: Negative for chest pain, palpitations and leg swelling.  Gastrointestinal: Negative for nausea, vomiting, abdominal pain, diarrhea and constipation.  Musculoskeletal: Positive for gait problem.  Skin: Negative for color change and rash.  Neurological: Positive for dizziness and light-headedness. Negative for seizures, facial asymmetry, weakness and headaches.  Hematological: Negative for adenopathy. Does not bruise/bleed easily.  Psychiatric/Behavioral: Positive for sleep disturbance and dysphoric mood. Negative for suicidal ideas. The patient is not nervous/anxious.        Objective:    BP 110/72 mmHg  Pulse 77  Temp(Src) 97.8 F (36.6 C) (Oral)  Ht 5\' 4"  (1.626 m)  Wt 137 lb (62.143 kg)  BMI 23.50 kg/m2  SpO2 96% Physical Exam  Constitutional: She is oriented to person, place, and time. She appears well-developed and well-nourished. No distress.  HENT:  Head: Normocephalic and atraumatic.  Right Ear: External ear normal.  Left Ear: External ear normal.  Nose: Nose normal.  Mouth/Throat: Oropharynx is clear and moist. No  oropharyngeal exudate.  Eyes: Conjunctivae are normal. Pupils are equal, round, and reactive to light. Right eye exhibits no discharge. Left eye exhibits no discharge. No scleral icterus.  Neck: Normal range of motion. Neck supple. No tracheal deviation present. No thyromegaly present.  Cardiovascular: Normal rate, regular rhythm and intact distal pulses.   Extrasystoles are present. Exam reveals no gallop and no friction rub.   Murmur heard.  Systolic murmur is present  Pulmonary/Chest: Effort normal and breath sounds normal. No respiratory distress. She has no wheezes. She has no rales. She exhibits no tenderness.  Musculoskeletal: Normal range of motion. She exhibits no  edema or tenderness.  Lymphadenopathy:    She has no cervical adenopathy.  Neurological: She is alert and oriented to person, place, and time. No cranial nerve deficit. She exhibits normal muscle tone. Coordination normal.  Skin: Skin is warm and dry. No rash noted. She is not diaphoretic. No erythema. No pallor.  Psychiatric: She has a normal mood and affect. Her behavior is normal. Judgment and thought content normal.          Assessment & Plan:   Problem List Items Addressed This Visit      Unprioritized   Atrial fibrillation ([address]) - Primary    Rate well controlled on current medication. Anticoagulated only with aspirin and Clopidogrel given risk of falls and concern about risks with warfarin/DTI.      Relevant Orders   CBC with Differential/Platelet   CKD (chronic kidney disease), stage III    Repeat renal function with labs today.      Relevant Orders   Comprehensive metabolic panel   HTN (hypertension)    BP Readings from Last 3 Encounters:  07/21/15 110/72  05/29/15 150/80  04/21/15 136/70   BP low side today. Discussed monitoring and potentially limiting Lisinopril to 10mg  daily if BP persistently low.      Insomnia    Occasional insomnia. Will continue prn Tylenol-PM          Return in about 3 months (around 10/20/2015) for Recheck.  Ronette Deter, MD Internal Medicine [facility]

## 2015-07-21 NOTE — Assessment & Plan Note (Signed)
Rate well controlled on current medication. Anticoagulated only with aspirin and Clopidogrel given risk of falls and concern about risks with warfarin/DTI.

## 2015-07-21 NOTE — Patient Instructions (Signed)
Labs today.   Follow up in 3 months.  

## 2015-07-21 NOTE — Assessment & Plan Note (Signed)
Occasional insomnia. Will continue prn Tylenol-PM

## 2015-07-21 NOTE — Assessment & Plan Note (Signed)
Repeat renal function with labs today. 

## 2015-07-26 ENCOUNTER — Other Ambulatory Visit: Payer: Self-pay | Admitting: Internal Medicine

## 2015-08-03 ENCOUNTER — Ambulatory Visit (INDEPENDENT_AMBULATORY_CARE_PROVIDER_SITE_OTHER): Payer: Medicare Other

## 2015-08-03 DIAGNOSIS — E538 Deficiency of other specified B group vitamins: Secondary | ICD-10-CM | POA: Diagnosis not present

## 2015-08-03 MED ORDER — CYANOCOBALAMIN 1000 MCG/ML IJ SOLN
1000.0000 ug | Freq: Once | INTRAMUSCULAR | Status: AC
  Administered 2015-08-03: 1000 ug via INTRAMUSCULAR

## 2015-08-03 NOTE — Progress Notes (Signed)
Patient came in for b12 injection.  Received in Left deltoid.  Patient tolerated well  

## 2015-09-05 ENCOUNTER — Ambulatory Visit (INDEPENDENT_AMBULATORY_CARE_PROVIDER_SITE_OTHER): Payer: Medicare Other | Admitting: *Deleted

## 2015-09-05 DIAGNOSIS — E538 Deficiency of other specified B group vitamins: Secondary | ICD-10-CM

## 2015-09-05 MED ORDER — CYANOCOBALAMIN 1000 MCG/ML IJ SOLN
1000.0000 ug | Freq: Once | INTRAMUSCULAR | Status: AC
  Administered 2015-09-05: 1000 ug via INTRAMUSCULAR

## 2015-09-05 NOTE — Progress Notes (Signed)
Patient presented for B 12 injection to left deltoid, patient tolerated injection well.

## 2015-09-27 ENCOUNTER — Other Ambulatory Visit: Payer: Self-pay | Admitting: Internal Medicine

## 2015-10-05 ENCOUNTER — Ambulatory Visit (INDEPENDENT_AMBULATORY_CARE_PROVIDER_SITE_OTHER): Payer: Medicare Other

## 2015-10-05 DIAGNOSIS — E538 Deficiency of other specified B group vitamins: Secondary | ICD-10-CM

## 2015-10-05 MED ORDER — CYANOCOBALAMIN 1000 MCG/ML IJ SOLN
1000.0000 ug | Freq: Once | INTRAMUSCULAR | Status: AC
  Administered 2015-10-05: 1000 ug via INTRAMUSCULAR

## 2015-10-05 NOTE — Progress Notes (Signed)
Patient came in for B12 injection.  Received in Right deltoid.  Patient tolerated well.  

## 2015-10-20 ENCOUNTER — Encounter: Payer: Self-pay | Admitting: Internal Medicine

## 2015-10-20 ENCOUNTER — Ambulatory Visit (INDEPENDENT_AMBULATORY_CARE_PROVIDER_SITE_OTHER): Payer: Medicare Other | Admitting: Internal Medicine

## 2015-10-20 VITALS — BP 144/96 | HR 78 | Ht 64.0 in | Wt 137.6 lb

## 2015-10-20 DIAGNOSIS — R42 Dizziness and giddiness: Secondary | ICD-10-CM | POA: Diagnosis not present

## 2015-10-20 DIAGNOSIS — I6310 Cerebral infarction due to embolism of unspecified precerebral artery: Secondary | ICD-10-CM

## 2015-10-20 DIAGNOSIS — I480 Paroxysmal atrial fibrillation: Secondary | ICD-10-CM

## 2015-10-20 DIAGNOSIS — D649 Anemia, unspecified: Secondary | ICD-10-CM

## 2015-10-20 DIAGNOSIS — I10 Essential (primary) hypertension: Secondary | ICD-10-CM | POA: Diagnosis not present

## 2015-10-20 LAB — CBC WITH DIFFERENTIAL/PLATELET
BASOS ABS: 0 10*3/uL (ref 0.0–0.1)
BASOS PCT: 0.5 % (ref 0.0–3.0)
EOS ABS: 0.2 10*3/uL (ref 0.0–0.7)
Eosinophils Relative: 3 % (ref 0.0–5.0)
HEMATOCRIT: 35.3 % — AB (ref 36.0–46.0)
HEMOGLOBIN: 11.9 g/dL — AB (ref 12.0–15.0)
LYMPHS PCT: 29.5 % (ref 12.0–46.0)
Lymphs Abs: 2.2 10*3/uL (ref 0.7–4.0)
MCHC: 33.8 g/dL (ref 30.0–36.0)
MCV: 91.8 fl (ref 78.0–100.0)
MONOS PCT: 9.4 % (ref 3.0–12.0)
Monocytes Absolute: 0.7 10*3/uL (ref 0.1–1.0)
NEUTROS ABS: 4.3 10*3/uL (ref 1.4–7.7)
Neutrophils Relative %: 57.6 % (ref 43.0–77.0)
PLATELETS: 189 10*3/uL (ref 150.0–400.0)
RBC: 3.85 Mil/uL — ABNORMAL LOW (ref 3.87–5.11)
RDW: 14.8 % (ref 11.5–15.5)
WBC: 7.4 10*3/uL (ref 4.0–10.5)

## 2015-10-20 LAB — COMPREHENSIVE METABOLIC PANEL
ALBUMIN: 3.5 g/dL (ref 3.5–5.2)
ALT: 11 U/L (ref 0–35)
AST: 21 U/L (ref 0–37)
Alkaline Phosphatase: 68 U/L (ref 39–117)
BUN: 17 mg/dL (ref 6–23)
CALCIUM: 9.7 mg/dL (ref 8.4–10.5)
CHLORIDE: 107 meq/L (ref 96–112)
CO2: 27 meq/L (ref 19–32)
Creatinine, Ser: 1.42 mg/dL — ABNORMAL HIGH (ref 0.40–1.20)
GFR: 36.53 mL/min — AB (ref >60.00)
Glucose, Bld: 135 mg/dL — ABNORMAL HIGH (ref 70–99)
Potassium: 4.8 mEq/L (ref 3.5–5.1)
Sodium: 138 mEq/L (ref 135–145)
Total Bilirubin: 0.7 mg/dL (ref 0.2–1.2)
Total Protein: 6.7 g/dL (ref 6.0–8.3)

## 2015-10-20 MED ORDER — LISINOPRIL 5 MG PO TABS: 5.0000 mg | ORAL_TABLET | Freq: Every day | ORAL | 3 refills | Status: DC

## 2015-10-20 NOTE — Progress Notes (Signed)
Pre visit review using our clinic review tool, if applicable. No additional management support is needed unless otherwise documented below in the visit note. 

## 2015-10-20 NOTE — Patient Instructions (Signed)
Restart Lisionpril 5mg  daily.  Follow up with Dr. Rockey Situ next month as scheduled.

## 2015-10-20 NOTE — Assessment & Plan Note (Addendum)
BP has been low at home. She has been holding Lisinopril. Will add back Lisinopril at lower dose 5mg  daily (sent to mail order). Repeat renal function today and at follow up visit after restarting medication.

## 2015-10-20 NOTE — Assessment & Plan Note (Signed)
Chronic intermittent lightheadedness. Exam stable. Will repeat CBC and CMP. Reviewed previous ECHO and carotid US with pt. Will hold on any additional evaluation right now per her preference. Suspect low BP was playing a role. Will restart Lisinopril at lower dose of 5mg  daily.

## 2015-10-20 NOTE — Progress Notes (Signed)
Subjective:    Patient ID: Dawn Cooper, female    DOB: October 04, 1918, 82 y.o.   MRN: [id number]  HPI  82YO female presents for follow up.  Feeling tired and lightheaded when standing at times. This has been ongoing for months. BP mostly 992E systolic. She stopped her lisinopril because of worry about BP. No chest pain.   Wt Readings from Last 3 Encounters:  10/20/15 137 lb 9.6 oz (62.4 kg)  07/21/15 137 lb (62.1 kg)  05/29/15 135 lb 4 oz (61.3 kg)   BP Readings from Last 3 Encounters:  10/20/15 (!) 144/96  07/21/15 110/72  05/29/15 (!) 150/80    Past Medical History:  Diagnosis Date  . A-fib ([place])   . Broken leg    right  . Cardiomyopathy   . Chronic kidney disease   . GERD (gastroesophageal reflux disease)   . Heart murmur   . Hiatal hernia   . Hypertension   . Mild mitral regurgitation by prior echocardiogram   . Moderate aortic stenosis   . Stroke ([place])   . Tricuspid regurgitation    Family History  Problem Relation Age of Onset  . Stroke Sister   . Cancer Brother     pancreatic  . Cancer Sister     pancreatic  . Diabetes    . Heart attack Neg Hx   . Diabetes Brother    Past Surgical History:  Procedure Laterality Date  . APPENDECTOMY    . CATARACT EXTRACTION  2013   left  . CHOLECYSTECTOMY    . FEMUR FRACTURE SURGERY  12/2011   Dr. Sabra Heck, s/p rehab at [facility]  . GALLBLADDER SURGERY    . VAGINAL DELIVERY     Social History   Social History  . Marital status: Widowed    Spouse name: N/A  . Number of children: N/A  . Years of education: N/A   Social History Main Topics  . Smoking status: Never Smoker  . Smokeless tobacco: Never Used  . Alcohol use No  . Drug use: No  . Sexual activity: Not Asked   Other Topics Concern  . None   Social History Narrative   Lives in [place] alone. 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    Review of Systems  Constitutional: Negative for appetite change, chills, fatigue, fever and unexpected weight  change.  Eyes: Negative for visual disturbance.  Respiratory: Negative for cough, chest tightness and shortness of breath.   Cardiovascular: Negative for chest pain, palpitations and leg swelling.  Gastrointestinal: Negative for abdominal pain, constipation and diarrhea.  Musculoskeletal: Positive for gait problem (unsteady). Negative for arthralgias and myalgias.  Skin: Negative for color change and rash.  Neurological: Positive for light-headedness. Negative for syncope, facial asymmetry, speech difficulty, numbness and headaches.  Hematological: Negative for adenopathy. Does not bruise/bleed easily.  Psychiatric/Behavioral: Negative for dysphoric mood. The patient is not nervous/anxious.        Objective:    BP (!) 144/96 (BP Location: Left Arm, Patient Position: Sitting, Cuff Size: Normal)   Pulse 78   Ht '5\' 4"'$  (1.626 m)   Wt 137 lb 9.6 oz (62.4 kg)   SpO2 96%   BMI 23.62 kg/m  Physical Exam  Constitutional: She is oriented to person, place, and time. She appears well-developed and well-nourished. No distress.  HENT:  Head: Normocephalic and atraumatic.  Right Ear: External ear normal.  Left Ear: External ear normal.  Nose: Nose normal.  Mouth/Throat: Oropharynx is clear and moist. No oropharyngeal exudate.  Eyes: Conjunctivae are normal. Pupils are equal, round, and reactive to light. Right eye exhibits no discharge. Left eye exhibits no discharge. No scleral icterus.  Neck: Normal range of motion. Neck supple. No tracheal deviation present. No thyromegaly present.  Cardiovascular: Normal rate, regular rhythm and intact distal pulses.  Exam reveals no gallop and no friction rub.   Murmur heard.  Systolic murmur is present  Pulmonary/Chest: Effort normal and breath sounds normal. No respiratory distress. She has no wheezes. She has no rales. She exhibits no tenderness.  Musculoskeletal: Normal range of motion. She exhibits no edema or tenderness.  Lymphadenopathy:    She has  no cervical adenopathy.  Neurological: She is alert and oriented to person, place, and time. No cranial nerve deficit. She exhibits normal muscle tone. Coordination normal.  Skin: Skin is warm and dry. No rash noted. She is not diaphoretic. No erythema. No pallor.  Psychiatric: She has a normal mood and affect. Her behavior is normal. Judgment and thought content normal.          Assessment & Plan:   Problem List Items Addressed This Visit      Unprioritized   Anemia (Chronic)    Repeat CBC with labs today.      Relevant Orders   CBC w/Diff   Atrial fibrillation ([facility]) - Primary (Chronic)    Rate well controlled with Metoprolol. Normal rhythm today. Continue Clopidogrel and aspirin only for anticoagulation, given falls risks with warfarin/DTI.      Relevant Medications   lisinopril (PRINIVIL,ZESTRIL) 5 MG tablet   HTN (hypertension)    BP has been low at home. She has been holding Lisinopril. Will add back Lisinopril at lower dose '5mg'$  daily (sent to mail order). Repeat renal function today and at follow up visit after restarting medication.      Relevant Medications   lisinopril (PRINIVIL,ZESTRIL) 5 MG tablet   Other Relevant Orders   Comp Met (CMET)   Lightheadedness (Chronic)    Chronic intermittent lightheadedness. Exam stable. Will repeat CBC and CMP. Reviewed previous ECHO and carotid US with pt. Will hold on any additional evaluation right now per her preference. Suspect low BP was playing a role. Will restart Lisinopril at lower dose of '5mg'$  daily.       Other Visit Diagnoses   None.      Return in about 4 weeks (around 11/17/2015) for New Patient.  Ronette Deter, MD Internal Medicine [facility]

## 2015-10-20 NOTE — Assessment & Plan Note (Signed)
Repeat CBC with labs today. 

## 2015-10-20 NOTE — Assessment & Plan Note (Signed)
Rate well controlled with Metoprolol. Normal rhythm today. Continue Clopidogrel and aspirin only for anticoagulation, given falls risks with warfarin/DTI.

## 2015-11-07 ENCOUNTER — Ambulatory Visit (INDEPENDENT_AMBULATORY_CARE_PROVIDER_SITE_OTHER): Payer: Medicare Other

## 2015-11-07 DIAGNOSIS — E538 Deficiency of other specified B group vitamins: Secondary | ICD-10-CM

## 2015-11-07 MED ORDER — CYANOCOBALAMIN 1000 MCG/ML IJ SOLN
1000.0000 ug | Freq: Once | INTRAMUSCULAR | Status: AC
  Administered 2015-11-07: 1000 ug via INTRAMUSCULAR

## 2015-11-07 NOTE — Progress Notes (Signed)
Patient came in for b12 injection.  Received in left deltoid.  Patient tolerated well.

## 2015-12-04 ENCOUNTER — Ambulatory Visit (INDEPENDENT_AMBULATORY_CARE_PROVIDER_SITE_OTHER): Payer: Medicare Other | Admitting: Cardiovascular Disease

## 2015-12-04 ENCOUNTER — Encounter: Payer: Self-pay | Admitting: Cardiovascular Disease

## 2015-12-04 VITALS — BP 130/90 | HR 69 | Ht 64.0 in | Wt 134.5 lb

## 2015-12-04 DIAGNOSIS — R2681 Unsteadiness on feet: Secondary | ICD-10-CM

## 2015-12-04 DIAGNOSIS — I480 Paroxysmal atrial fibrillation: Secondary | ICD-10-CM | POA: Diagnosis not present

## 2015-12-04 DIAGNOSIS — I10 Essential (primary) hypertension: Secondary | ICD-10-CM | POA: Diagnosis not present

## 2015-12-04 DIAGNOSIS — I350 Nonrheumatic aortic (valve) stenosis: Secondary | ICD-10-CM | POA: Diagnosis not present

## 2015-12-04 DIAGNOSIS — I6310 Cerebral infarction due to embolism of unspecified precerebral artery: Secondary | ICD-10-CM | POA: Diagnosis not present

## 2015-12-04 NOTE — Progress Notes (Signed)
Cardiology Office Note  Date:  12/04/2015   ID:  Tana Conch, DOB [birth year], MRN [id number]  PCP:  Tommi Rumps, MD   Chief Complaint  Patient presents with  . Other    6 month f/u leg weakness. Meds reviewed verbally with pt.    HPI:  Ms. Shufford is a very pleasant 82 -year-old woman with moderate to severe aortic valve stenosis,  who presented to the hospital on January 02 2011 with  atrial fibrillation with RVR, elevated BNP, CT scan showing bilateral moderate sized pleural effusions who was started on rate control and pradaxa. She refused cardioversion after 4 weeks on anticoagulation. At approximately 6 weeks out, she had a AVM bleed and presented to the hospital with melena and hematocrit of 18. She had been having melena for at least one week and did not go to the hospital. She had EGD with treatment of her AVM. All anticoagulation was held.  We had discussed the case with Dr. Gustavo Lah and it was felt that given her risk of further bleeding from additional AVMs and given her age and the extent of her recent bleed, that no further anticoagulation be used.  She presents today for routine follow-up of her aortic valve stenosis and  History of stroke  In follow-up today, she reports that she is doing well No new complaints, walks with a cane Lab work reviewed, Creatinine 1.4, stable No exercise, has significant leg weakness,  4 weeks ago, chair fell over while outside, bruising to her legs, causing leg weakness One week later, following Monday, leg gave way. Blames it on the previous injury when her chair fell over Today feels like her legs are getting stronger   she denies any TIA or stroke type symptoms  She reports that she is doing well on aspirin and Plavix , again does not want stronger blood thinners as she is doing well   EKG on today's visit showing atrial fibrillation with left bundle branch block, rate in the 69s   other past medical history admitted to the  hospital 09/10/2014 for stroke,  acute stroke on the left  On MRI She recovered well despite initial slurred speech  Last echocardiogram 2/ 2016   hospitalization at the end of February 2016 with stroke type symptoms, stroke on MRI Family had reported a mechanical fall, imaging showed pubic ramus fracture, day prior to admission she was found to be unresponsive and would not answer verbal commands. She became more alert during the daytime, slurred speech. They brought her to the hospital 05/19/2014. She is lethargic, bradycardia, hypotension, given IV fluids with improvement of her symptoms. MRI showing multiple strokes, old Felt to be a high risk bleeding candidate, started on Plavix  Echocardiogram in the hospital showed ejection fraction 70%, moderate to severe aortic valve stenosis, moderate MR Carotid ultrasound 05/20/2014 showing moderate bilateral carotid arterial disease worse on the left    fall in November 2013. She broke her leg and had surgery with a rod placed on the right.   Echocardiogram while she was in atrial fibrillation showed ejection fraction 35-45%, normal left ventricular systolic pressures, moderate aortic valve stenosis with mean gradient of 32 mmHg  Previous lab work:  total cholesterol 132, LDL 82, HDL 40, triglycerides 502, hemoglobin A1c 4.9  Previous CT Scan of the chest showing moderate bilateral pleural effusions with compressive atelectasis in both lower lobes, no PE   PMH:   has a past medical history of A-fib ([place]); Broken leg; Cardiomyopathy; Chronic  kidney disease; GERD (gastroesophageal reflux disease); Heart murmur; Hiatal hernia; Hypertension; Mild mitral regurgitation by prior echocardiogram; Moderate aortic stenosis; Stroke [facility]); and Tricuspid regurgitation.  PSH:    Past Surgical History:  Procedure Laterality Date  . APPENDECTOMY    . CATARACT EXTRACTION  2013   left  . CHOLECYSTECTOMY    . FEMUR FRACTURE SURGERY  12/2011   Dr.  Sabra Heck, s/p rehab at [facility]  . GALLBLADDER SURGERY    . VAGINAL DELIVERY      Current Outpatient Prescriptions  Medication Sig Dispense Refill  . aspirin EC 81 MG tablet Take 1 tablet (81 mg total) by mouth daily. 30 tablet 0  . clopidogrel (PLAVIX) 75 MG tablet TAKE 1 TABLET EVERY DAY 90 tablet 2  . furosemide (LASIX) 20 MG tablet Take 1 tablet (20 mg total) by mouth every other day. 45 tablet 3  . isosorbide mononitrate (IMDUR) 30 MG 24 hr tablet TAKE 1 TABLET EVERY DAY 90 tablet 3  . lisinopril (PRINIVIL,ZESTRIL) 5 MG tablet Take 1 tablet (5 mg total) by mouth daily. 90 tablet 3  . metoprolol tartrate (LOPRESSOR) 25 MG tablet TAKE 1 TABLET TWICE DAILY 180 tablet 3  . pantoprazole (PROTONIX) 40 MG tablet TAKE 1 TABLET EVERY DAY 90 tablet 3  . potassium chloride (K-DUR,KLOR-CON) 10 MEQ tablet Take 1 tablet (10 mEq total) by mouth every other day. 45 tablet 1  . triamcinolone cream (KENALOG) 0.1 % Apply 1 application topically 2 (two) times daily. 30 g 0   No current facility-administered medications for this visit.      Allergies:   Pradaxa [dabigatran etexilate mesylate]; Clonidine derivatives; and Penicillins   Social History:  The patient  reports that she has never smoked. She has never used smokeless tobacco. She reports that she does not drink alcohol or use drugs.   Family History:   family history includes Cancer in her brother and sister; Diabetes in her brother; Stroke in her sister.    Review of Systems: Review of Systems  Constitutional: Negative.   Respiratory: Negative.   Cardiovascular: Negative.   Gastrointestinal: Negative.   Musculoskeletal: Positive for falls.       Leg weakness  Neurological: Negative.   Psychiatric/Behavioral: Negative.   All other systems reviewed and are negative.    PHYSICAL EXAM: VS:  BP 130/90 (BP Location: Left Arm, Patient Position: Sitting, Cuff Size: Normal)   Pulse 69   Ht 5\' 4"  (1.626 m)   Wt 134 lb 8 oz (61 kg)   BMI  23.09 kg/m  , BMI Body mass index is 23.09 kg/m. GEN: Well nourished, well developed, in no acute distress  HEENT: normal  Neck: no JVD, carotid bruits, or masses Cardiac: RRR; 2+ sem rsb,  No rubs, or gallops,no edema  Respiratory:  clear to auscultation bilaterally, normal work of breathing GI: soft, nontender, nondistended, + BS MS: no deformity or atrophy  Skin: warm and dry, no rash Neuro:  Strength and sensation are intact Psych: euthymic mood, full affect    Recent Labs: 10/20/2015: ALT 11; BUN 17; Creatinine, Ser 1.42; Hemoglobin 11.9; Platelets 189.0; Potassium 4.8; Sodium 138    Lipid Panel Lab Results  Component Value Date   CHOL 168 09/10/2014   HDL 30 (L) 09/10/2014   LDLCALC 120 (H) 09/10/2014   TRIG 90 09/10/2014      Wt Readings from Last 3 Encounters:  12/04/15 134 lb 8 oz (61 kg)  10/20/15 137 lb 9.6 oz (62.4 kg)  07/21/15  137 lb (62.1 kg)       ASSESSMENT AND PLAN:  Chronic atrial fibrillation (HCC) - Plan: EKG 12-Lead Rate well controlled, does not want anticoagulation. Prefers to stay on aspirin and Plavix  Cerebrovascular accident (CVA) due to embolism of precerebral artery ([place]) On aspirin and Plavix. Has declined anticoagulation in the past Daughter helps make her decisions  Essential hypertension Blood pressure is well controlled on today's visit. No changes made to the medications.  Aortic valve stenosis, severe Previous echocardiogram early 2016 showing severe aortic valve stenosis She denies any symptoms concerning for critical AS, denies shortness of breath on exertion, no lightheadedness, chest pain. We will recommend repeat echocardiogram WITH dilated Gait instability Walks with a cane, walker for longer distances, recent falls   Total encounter time more than 25 minutes  Greater than 50% was spent in counseling and coordination of care with the patient   Disposition:   F/U  6 months   Orders Placed This Encounter   Procedures  . EKG 12-Lead     Signed, Esmond Plants, M.D., Ph.D. 12/04/2015  [place], [place]

## 2015-12-04 NOTE — Patient Instructions (Signed)
Medication Instructions:   No medication changes made  If pain gets severe, ask Dr. Chauncey Cruel about tramadol  Labwork:  No new labs needed  Testing/Procedures:  No further testing at this time   Follow-Up: It was a pleasure seeing you in the office today. Please call us if you have new issues that need to be addressed before your next appt.  [phone]  Your physician wants you to follow-up in: 6 months.  You will receive a reminder letter in the mail two months in advance. If you don't receive a letter, please call our office to schedule the follow-up appointment.  If you need a refill on your cardiac medications before your next appointment, please call your pharmacy.

## 2015-12-12 ENCOUNTER — Ambulatory Visit (INDEPENDENT_AMBULATORY_CARE_PROVIDER_SITE_OTHER): Payer: Medicare Other | Admitting: Family Medicine

## 2015-12-12 ENCOUNTER — Encounter: Payer: Self-pay | Admitting: Family Medicine

## 2015-12-12 VITALS — BP 128/72 | HR 74 | Temp 97.9°F | Wt 135.0 lb

## 2015-12-12 DIAGNOSIS — R42 Dizziness and giddiness: Secondary | ICD-10-CM | POA: Diagnosis not present

## 2015-12-12 DIAGNOSIS — Z23 Encounter for immunization: Secondary | ICD-10-CM

## 2015-12-12 DIAGNOSIS — I6310 Cerebral infarction due to embolism of unspecified precerebral artery: Secondary | ICD-10-CM

## 2015-12-12 DIAGNOSIS — I10 Essential (primary) hypertension: Secondary | ICD-10-CM | POA: Diagnosis not present

## 2015-12-12 LAB — BASIC METABOLIC PANEL
BUN: 27 mg/dL — AB (ref 6–23)
CO2: 29 meq/L (ref 19–32)
Calcium: 9.4 mg/dL (ref 8.4–10.5)
Chloride: 106 mEq/L (ref 96–112)
Creatinine, Ser: 1.45 mg/dL — ABNORMAL HIGH (ref 0.40–1.20)
GFR: 35.65 mL/min — AB (ref >60.00)
GLUCOSE: 109 mg/dL — AB (ref 70–99)
POTASSIUM: 4.5 meq/L (ref 3.5–5.1)
Sodium: 138 mEq/L (ref 135–145)

## 2015-12-12 MED ORDER — CYANOCOBALAMIN 1000 MCG/ML IJ SOLN
1000.0000 ug | Freq: Once | INTRAMUSCULAR | Status: AC
  Administered 2015-12-12: 1000 ug via INTRAMUSCULAR

## 2015-12-12 NOTE — Progress Notes (Signed)
  Tommi Rumps, MD Phone: [phone]  Dawn Cooper is a 82 y.o. female who presents today for follow-up.  HYPERTENSION  Disease Monitoring  Home BP Monitoring typically [id number] systolically Chest pain- no    Dyspnea- no Medications  Compliance-  taking lisinopril, metoprolol daily, taking Lasix every other day. Lightheadedness-  unchanged  Edema- no Saw cardiology last week and they're planning an echo.  History of CVA: No new deficits. Noted some residual speech changes. No new numbness or weakness. She is taking Plavix and aspirin.  Notes some mild chronic blurry vision for which she is going to see ophthalmology.  PMH: nonsmoker.   ROS see history of present illness  Objective  Physical Exam Vitals:   12/12/15 1052  BP: 128/72  Pulse: 74  Temp: 97.9 F (36.6 C)    BP Readings from Last 3 Encounters:  12/12/15 128/72  12/04/15 130/90  10/20/15 (!) 144/96   Wt Readings from Last 3 Encounters:  12/12/15 135 lb (61.2 kg)  12/04/15 134 lb 8 oz (61 kg)  10/20/15 137 lb 9.6 oz (62.4 kg)    Physical Exam  Constitutional: No distress.  Cardiovascular: Normal rate and regular rhythm.   Murmur (2/6 systolic murmur) heard. Pulmonary/Chest: Effort normal and breath sounds normal.  Neurological: She is alert.  CN 2-12 intact, 5/5 strength in bilateral biceps, triceps, grip, quads, hamstrings, plantar and dorsiflexion, sensation to light touch intact in bilateral UE and LE, gait assisted by cane, 2+ patellar reflexes  Skin: Skin is warm and dry. She is not diaphoretic.     Assessment/Plan: Please see individual problem list.  CVA (cerebral vascular accident) ([place]) Stable. No new symptoms. Continue aspirin and Plavix.  HTN (hypertension) At goal. Continue current medications. BMP today. To keep her appointment for the echo as scheduled by cardiology. She will see an ophthalmologist for follow-up of her vision.  Orders Placed This Encounter  Procedures  .  Flu vaccine HIGH DOSE PF (Fluzone High dose)  . Basic metabolic panel    Meds ordered this encounter  Medications  . cyanocobalamin ((VITAMIN B-12)) injection 1,000 mcg    Tommi Rumps, MD [place]

## 2015-12-12 NOTE — Assessment & Plan Note (Signed)
Stable. No new symptoms. Continue aspirin and Plavix.

## 2015-12-12 NOTE — Assessment & Plan Note (Addendum)
At goal. Continue current medications. BMP today. 

## 2015-12-12 NOTE — Patient Instructions (Signed)
Nice to meet you. We'll check some lab work and call me with the results. Please continue your current medications. Please see an eye doctor for a vision check.

## 2015-12-13 ENCOUNTER — Ambulatory Visit (INDEPENDENT_AMBULATORY_CARE_PROVIDER_SITE_OTHER): Payer: Medicare Other

## 2015-12-13 ENCOUNTER — Other Ambulatory Visit: Payer: Self-pay

## 2015-12-13 DIAGNOSIS — I480 Paroxysmal atrial fibrillation: Secondary | ICD-10-CM | POA: Diagnosis not present

## 2015-12-13 DIAGNOSIS — I6310 Cerebral infarction due to embolism of unspecified precerebral artery: Secondary | ICD-10-CM

## 2015-12-13 DIAGNOSIS — I10 Essential (primary) hypertension: Secondary | ICD-10-CM

## 2015-12-13 DIAGNOSIS — I350 Nonrheumatic aortic (valve) stenosis: Secondary | ICD-10-CM

## 2015-12-19 ENCOUNTER — Encounter: Payer: Self-pay | Admitting: Cardiovascular Disease

## 2015-12-19 ENCOUNTER — Ambulatory Visit (INDEPENDENT_AMBULATORY_CARE_PROVIDER_SITE_OTHER): Payer: Medicare Other | Admitting: Cardiovascular Disease

## 2015-12-19 VITALS — BP 130/80 | HR 85 | Ht 60.0 in | Wt 135.0 lb

## 2015-12-19 DIAGNOSIS — I350 Nonrheumatic aortic (valve) stenosis: Secondary | ICD-10-CM | POA: Diagnosis not present

## 2015-12-19 DIAGNOSIS — I6310 Cerebral infarction due to embolism of unspecified precerebral artery: Secondary | ICD-10-CM | POA: Diagnosis not present

## 2015-12-19 DIAGNOSIS — N183 Chronic kidney disease, stage 3 unspecified: Secondary | ICD-10-CM

## 2015-12-19 DIAGNOSIS — I480 Paroxysmal atrial fibrillation: Secondary | ICD-10-CM

## 2015-12-19 DIAGNOSIS — Z7189 Other specified counseling: Secondary | ICD-10-CM

## 2015-12-19 NOTE — Progress Notes (Signed)
Cardiology Office Note  Date:  12/19/2015   ID:  Dawn Cooper, DOB 01-Feb-1919, MRN [id number]  PCP:  Dawn Rumps, MD   Chief Complaint  Patient presents with  . other    Follow up from Echo. Meds reviewed by the pt. verbally.     HPI:  Dawn Cooper is a very pleasant 82 -year-old woman with severe aortic valve stenosis, Paroxysmal atrial fibrillation , history of GI bleed from AVM, history of stroke June 2016, who presents for follow-up of her aortic valve stenosis and discussion of recent echocardiogram findings  On today's visit she reports that she feels well with no complaints Relatively sedentary at baseline, denies any shortness of breath on exertion She does not walk for a far, does not warfarin fast Walks with a cane, no recent falls Denies any chest pain, no significant leg swelling  Previous Creatinine 1.4, stable significant leg weakness,  On her last clinic visit, fell over while outside, bruising to her legs, causing leg weakness One week later, following Monday, leg gave way. Blames it on the previous injury when her chair fell over Today feels that her legs have improved back to her baseline though still weak in general  Echocardiogram discussed with her showing severe aortic valve stenosis by valve gradient, velocity. Worsening of her stenosis since previous echo February 2016 Both echocardiogram results discussed with her in detail and family who presented with her today  Other past medical history reviewed  presented to the hospital on January 02 2011 with atrial fibrillation with RVR, elevated BNP, CT scan showing bilateral moderate sized pleural effusions who was started on rate control and pradaxa.  She refused cardioversion after 4 weeks on anticoagulation. At approximately 6 weeks out, she had a AVM bleed and presented to the hospital with melena and hematocrit of 18. She had been having melena for at least one week and did not go to the hospital. She had  EGD with treatment of her AVM. All anticoagulation was held.   We had discussed the case with Dr. Gustavo Cooper (GI) and it was felt that given her risk of further bleeding from additional AVMs and given her age and the extent of her recent bleed, that no further anticoagulation be used.    on aspirin and Plavix , does not want stronger blood thinners as she is doing well  Previous EKG showing atrial fibrillation with left bundle branch block, rate in the 69s  admitted to the hospital 09/10/2014 for stroke, acute stroke on the left On MRI She recovered well despite initial slurred speech  Last echocardiogram 2/ 2016  hospitalization at the end of February 2016 with stroke type symptoms, stroke on MRI Family had reported a mechanical fall, imaging showed pubic ramus fracture, day prior to admission she was found to be unresponsive and would not answer verbal commands. She became more alert during the daytime, slurred speech. They brought her to the hospital 05/19/2014. She is lethargic, bradycardia, hypotension, given IV fluids with improvement of her symptoms. MRI showing multiple strokes, old Felt to be a high risk bleeding candidate, started on Plavix  Echocardiogram in the hospital showed ejection fraction 70%, moderate to severe aortic valve stenosis, moderate MR Carotid ultrasound 05/20/2014 showing moderate bilateral carotid arterial disease worse on the left  fall in November 2013. She broke her leg and had surgery with a rod placed on the right.   Echocardiogram while she was in atrial fibrillation showed ejection fraction 35-45%, normal left ventricular systolic pressures,  moderate aortic valve stenosis with mean gradient of 32 mmHg  Previous lab work: total cholesterol 132, LDL 82, HDL 40, triglycerides 502, hemoglobin A1c 4.9  Previous CT Scan of the chest showing moderate bilateral pleural effusions with compressive atelectasis in both lower lobes, no PE  PMH:    has a past medical history of A-fib (Dawn Cooper); Broken leg; Cardiomyopathy; Chronic kidney disease; GERD (gastroesophageal reflux disease); Heart murmur; Hiatal hernia; Hypertension; Mild mitral regurgitation by prior echocardiogram; Moderate aortic stenosis; Stroke Dawn Cooper); and Tricuspid regurgitation.  PSH:    Past Surgical History:  Procedure Laterality Date  . APPENDECTOMY    . CATARACT EXTRACTION  2013   left  . CHOLECYSTECTOMY    . FEMUR FRACTURE SURGERY  12/2011   Dr. Sabra Cooper, s/p rehab at [facility] Cooper  . GALLBLADDER SURGERY    . VAGINAL DELIVERY      Current Outpatient Prescriptions  Medication Sig Dispense Refill  . aspirin EC 81 MG tablet Take 1 tablet (81 mg total) by mouth daily. 30 tablet 0  . clopidogrel (PLAVIX) 75 MG tablet TAKE 1 TABLET EVERY DAY 90 tablet 2  . furosemide (LASIX) 20 MG tablet Take 1 tablet (20 mg total) by mouth every other day. 45 tablet 3  . isosorbide mononitrate (IMDUR) 30 MG 24 hr tablet TAKE 1 TABLET EVERY DAY 90 tablet 3  . lisinopril (PRINIVIL,ZESTRIL) 5 MG tablet Take 1 tablet (5 mg total) by mouth daily. 90 tablet 3  . metoprolol tartrate (LOPRESSOR) 25 MG tablet TAKE 1 TABLET TWICE DAILY 180 tablet 3  . pantoprazole (PROTONIX) 40 MG tablet TAKE 1 TABLET EVERY DAY 90 tablet 3  . potassium chloride (K-DUR,KLOR-CON) 10 MEQ tablet Take 1 tablet (10 mEq total) by mouth every other day. 45 tablet 1  . triamcinolone cream (KENALOG) 0.1 % Apply 1 application topically 2 (two) times daily. 30 g 0   No current facility-administered medications for this visit.      Allergies:   Pradaxa [dabigatran etexilate mesylate]; Clonidine derivatives; and Penicillins   Social History:  The patient  reports that she has never smoked. She has never used smokeless tobacco. She reports that she does not drink alcohol or use drugs.   Family History:   family history includes Cancer in her brother and sister; Diabetes in her brother; Stroke in her sister.    Review of  Systems: Review of Systems  Constitutional: Negative.   Respiratory: Negative.   Cardiovascular: Negative.   Gastrointestinal: Negative.   Musculoskeletal: Negative.        Leg weakness, gait instability  Neurological: Negative.   Psychiatric/Behavioral: Negative.   All other systems reviewed and are negative.    PHYSICAL EXAM: VS:  BP 130/80 (BP Location: Left Arm, Patient Position: Sitting, Cuff Size: Normal)   Pulse 85   Ht 5' (1.524 m)   Wt 135 lb (61.2 kg)   BMI 26.37 kg/m  , BMI Body mass index is 26.37 kg/m. GEN: Well nourished, well developed, in no acute distress, walks with a cane  HEENT: normal  Neck: no JVD, carotid bruits, or masses Cardiac: RRR; 3+ SEM RSB, no rubs, or gallops,no edema  Respiratory:  clear to auscultation bilaterally, normal work of breathing GI: soft, nontender, nondistended, + BS MS: no deformity or atrophy  Skin: warm and dry, no rash Neuro:  Strength and sensation are intact Psych: euthymic mood, full affect    Recent Labs: 10/20/2015: ALT 11; Hemoglobin 11.9; Platelets 189.0 12/12/2015: BUN 27; Creatinine, Ser 1.45; Potassium  4.5; Sodium 138    Lipid Panel Lab Results  Component Value Date   CHOL 168 09/10/2014   HDL 30 (L) 09/10/2014   LDLCALC 120 (H) 09/10/2014   TRIG 90 09/10/2014      Wt Readings from Last 3 Encounters:  12/19/15 135 lb (61.2 kg)  12/12/15 135 lb (61.2 kg)  12/04/15 134 lb 8 oz (61 kg)       ASSESSMENT AND PLAN:  Chronic atrial fibrillation (HCC) Rate relatively well-controlled, Not on aggressive anticoagulation given reasons as detailed above No recent TIA or stroke type symptoms  Cerebrovascular accident (CVA) due to embolism of precerebral artery ([place]) History of previous stroke in 2016 On aspirin Plavix  Aortic valve stenosis, severe Long discussion concerning progression of her valve disease Various types of surgery discussed with her including mediastinal not of May, minimally invasive  open surgical procedure, and TAVR She is not currently interested in pursuing surgical options as she feels well with no complaints  CKD (chronic kidney disease), stage III Stable creatinine 1.4  Counseling regarding end of life decision making Long discussion concerning various outcomes for her aortic valve stenosis. We did suggest that she think about it, that whether we did anything would depend on her decision   Total encounter time more than 25 minutes  Greater than 50% was spent in counseling and coordination of care with the patient   Disposition:   F/U  6 months   Signed, Esmond Plants, M.D., Ph.D. 12/19/2015  [place], [place]

## 2015-12-19 NOTE — Patient Instructions (Addendum)
Medication Instructions:   No medication changes made  Labwork:  No new labs needed  Testing/Procedures:  No further testing at this time   Follow-Up: It was a pleasure seeing you in the office today. Please call us if you have new issues that need to be addressed before your next appt.  [phone]  Your physician wants you to follow-up in: 6 months.  You will receive a reminder letter in the mail two months in advance. If you don't receive a letter, please call our office to schedule the follow-up appointment.  If you need a refill on your cardiac medications before your next appointment, please call your pharmacy.     Aortic Stenosis Aortic stenosis is a narrowing of the aortic valve. The aortic valve is a gate-like structure that is located between the lower left chamber of the heart (left ventricle) and the blood vessel that leads away from the heart (aorta). When the aortic valve is narrowed, it does not open all the way. This makes it hard for the heart to pump blood into the aorta and causes the heart to work harder. The extra work can weaken the heart over time and lead to heart failure. CAUSES  Causes of aortic stenosis include:  Calcium deposits on the aortic valve that have made the valve stiff. This condition generally affects those over the age of 32. It is the most common cause of aortic stenosis.  A birth defect.  Rheumatic fever. This is a problem that may occur after a strep throat infection that was not treated adequately. Rheumatic fever can cause permanent damage to heart valves. SIGNS AND SYMPTOMS  People with aortic stenosis usually have no symptoms until the condition becomes severe. It may take 10-20 years for mild or moderate aortic stenosis to become severe. Symptoms may include:   Shortness of breath, especially with physical activity.   Feeling weak and tired (fatigued) or getting tired easily.  Chest discomfort (angina). This may occur with  minimal activity if the aortic stenosis is severe.  An irregular or faster-than-normal heartbeat.  Dizziness or fainting that happens with exertion or after taking certain heart medicines (such as nitroglycerin). DIAGNOSIS  Aortic stenosis is usually diagnosed with a physical exam and with a type of imaging test called echocardiography. During echocardiography, sound waves are used to evaluate how blood flows through the heart. If your health care provider suspects aortic stenosis but the test does not clearly show it, a procedure called cardiac catheterization may be done to diagnose the condition. Tests may also be done to evaluate heart function. They may include:  Electrocardiography. During this test, the electrical impulses of the heart are recorded while you are lying down and sticky patches are placed on your chest, arms, and legs.  Stress tests. There is more than one type of stress test. If a stress test is needed, ask your health care provider about which type is best for you.  Blood tests. TREATMENT  Treatment depends on how severe the aortic stenosis is, your symptoms, and the problems it is causing.   Observation. If the aortic stenosis is mild, no treatment may be needed. However, you will need to have the condition checked regularly to make sure it is not getting worse or causing serious problems.  Surgery. Surgery to repair or replace the aortic valve is the most common treatment for aortic stenosis. Several types of surgeries are available. The most common are open-heart surgery and transcutaneous aortic valve replacement (TAVR). TAVR  does not require that the chest be opened. It is usually performed on elderly patients and those who are not able to have open-heart surgery.  Medicines. Medicines may be given to keep symptoms from getting worse. Medicines cannot reverse aortic stenosis. HOME CARE INSTRUCTIONS   You may need to avoid certain types of physical activity. If your  aortic stenosis is mild, you may need to avoid only strenuous activity. The more severe your aortic stenosis, the more activities you will need to avoid. Talk with your health care provider about the types of activity you should avoid.  Take medicines only as directed by your health care provider.  If you are a woman with aortic valve stenosis and want to get pregnant, talk to your health care provider before you become pregnant.  If you are a woman with aortic valve stenosis and are pregnant, keep all follow-up visits with all recommended health care providers.  Keep all follow-up visits for tests, exams, and treatments as directed by your health care provider. SEEK IMMEDIATE MEDICAL CARE IF:  You develop chest pain or tightness.   You develop shortness of breath or difficulty breathing.   You develop light-headedness or faint.   It feels like your heartbeat is irregular or faster than normal.  You have a fever.   This information is not intended to replace advice given to you by your health care provider. Make sure you discuss any questions you have with your health care provider.   Document Released: 12/08/2002 Document Revised: 11/30/2014 Document Reviewed: 03/05/2012 Elsevier Interactive Patient Education [employer].

## 2015-12-22 ENCOUNTER — Other Ambulatory Visit: Payer: Self-pay

## 2015-12-22 ENCOUNTER — Other Ambulatory Visit: Payer: Self-pay | Admitting: Cardiovascular Disease

## 2015-12-22 DIAGNOSIS — R609 Edema, unspecified: Secondary | ICD-10-CM

## 2015-12-22 MED ORDER — FUROSEMIDE 20 MG PO TABS: 20.0000 mg | ORAL_TABLET | ORAL | 3 refills | Status: DC

## 2015-12-27 ENCOUNTER — Ambulatory Visit: Payer: Medicare Other | Admitting: Cardiovascular Disease

## 2016-01-16 ENCOUNTER — Ambulatory Visit (INDEPENDENT_AMBULATORY_CARE_PROVIDER_SITE_OTHER): Payer: Medicare Other

## 2016-01-16 DIAGNOSIS — E538 Deficiency of other specified B group vitamins: Secondary | ICD-10-CM

## 2016-01-16 MED ORDER — CYANOCOBALAMIN 1000 MCG/ML IJ SOLN
1000.0000 ug | Freq: Once | INTRAMUSCULAR | Status: AC
  Administered 2016-01-16: 1000 ug via INTRAMUSCULAR

## 2016-01-16 NOTE — Progress Notes (Addendum)
Patient came in for vitamin B 12 injection .  Injection administered in right deltoid patient tolerated well.     Reviewed.  Dr Nicki Reaper

## 2016-02-08 DIAGNOSIS — H353130 Nonexudative age-related macular degeneration, bilateral, stage unspecified: Secondary | ICD-10-CM | POA: Diagnosis not present

## 2016-02-20 ENCOUNTER — Ambulatory Visit (INDEPENDENT_AMBULATORY_CARE_PROVIDER_SITE_OTHER): Payer: Medicare Other

## 2016-02-20 VITALS — BP 120/62 | HR 74 | Temp 97.5°F | Resp 14 | Ht 61.0 in | Wt 135.0 lb

## 2016-02-20 DIAGNOSIS — E538 Deficiency of other specified B group vitamins: Secondary | ICD-10-CM | POA: Diagnosis not present

## 2016-02-20 DIAGNOSIS — Z0000 Encounter for general adult medical examination without abnormal findings: Secondary | ICD-10-CM

## 2016-02-20 MED ORDER — CYANOCOBALAMIN 1000 MCG/ML IJ SOLN
1000.0000 ug | Freq: Once | INTRAMUSCULAR | Status: AC
  Administered 2016-02-20: 1000 ug via INTRAMUSCULAR

## 2016-02-20 NOTE — Patient Instructions (Addendum)
[name] , Thank you for taking time to come for your Medicare Wellness Visit. I appreciate your ongoing commitment to your health goals. Please review the following plan we discussed and let me know if I can assist you in the future.   SCHEDULE NEXT B12 INJECTION  FOLLOW UP WITH DR. SONNENBERG AS NEEDED  These are the goals we discussed: Goals    . Healthy Lifestyle          STAY HYDRATED AND DRINK PLENTY OF FLUIDS STAY ACTIVE AND CONTINUE WALKING AND WORKING AROUND THE HOME FOR EXERCISE LOW CARB FOODS.  LEAN MEATS, VEGETABLES       This is a list of the screening recommended for you and due dates:  Health Maintenance  Topic Date Due  . Tetanus Vaccine  10[birth year]17/1939  . Shingles Vaccine  10/16/1980  . DEXA scan (bone density measurement)  10/16/1985  . Flu Shot  Completed  . Pneumonia vaccines  Completed    Bone Densitometry Introduction Bone densitometry is an imaging test that uses a special X-ray to measure the amount of calcium and other minerals in your bones (bone density). This test is also known as a bone mineral density test or dual-energy X-ray absorptiometry (DXA). The test can measure bone density at your hip and your spine. It is similar to having a regular X-ray. You may have this test to:  Diagnose a condition that causes weak or thin bones (osteoporosis).  Predict your risk of a broken bone (fracture).  Determine how well osteoporosis treatment is working. Tell a health care provider about:  Any allergies you have.  All medicines you are taking, including vitamins, herbs, eye drops, creams, and over-the-counter medicines.  Any problems you or family members have had with anesthetic medicines.  Any blood disorders you have.  Any surgeries you have had.  Any medical conditions you have.  Possibility of pregnancy.  Any other medical test you had within the previous 14 days that used contrast material. What are the risks? Generally, this is a safe  procedure. However, problems can occur and may include the following:  This test exposes you to a very small amount of radiation.  The risks of radiation exposure may be greater to unborn children. What happens before the procedure?  Do not take any calcium supplements for 24 hours before having the test. You can otherwise eat and drink what you usually do.  Take off all metal jewelry, eyeglasses, dental appliances, and any other metal objects. What happens during the procedure?  You may lie on an exam table. There will be an X-ray generator below you and an imaging device above you.  Other devices, such as boxes or braces, may be used to position your body properly for the scan.  You will need to lie still while the machine slowly scans your body.  The images will show up on a computer monitor. What happens after the procedure? You may need more testing at a later time. This information is not intended to replace advice given to you by your health care provider. Make sure you discuss any questions you have with your health care provider. Document Released: 04/02/2004 Document Revised: 08/17/2015 Document Reviewed: 08/19/2013  2017 Elsevier     Fall Prevention in the Home Introduction Falls can cause injuries. They can happen to people of all ages. There are many things you can do to make your home safe and to help prevent falls. What can I do on the outside  of my home?  Regularly fix the edges of walkways and driveways and fix any cracks.  Remove anything that might make you trip as you walk through a door, such as a raised step or threshold.  Trim any bushes or trees on the path to your home.  Use bright outdoor lighting.  Clear any walking paths of anything that might make someone trip, such as rocks or tools.  Regularly check to see if handrails are loose or broken. Make sure that both sides of any steps have handrails.  Any raised decks and porches should have  guardrails on the edges.  Have any leaves, snow, or ice cleared regularly.  Use sand or salt on walking paths during winter.  Clean up any spills in your garage right away. This includes oil or grease spills. What can I do in the bathroom?  Use night lights.  Install grab bars by the toilet and in the tub and shower. Do not use towel bars as grab bars.  Use non-skid mats or decals in the tub or shower.  If you need to sit down in the shower, use a plastic, non-slip stool.  Keep the floor dry. Clean up any water that spills on the floor as soon as it happens.  Remove soap buildup in the tub or shower regularly.  Attach bath mats securely with double-sided non-slip rug tape.  Do not have throw rugs and other things on the floor that can make you trip. What can I do in the bedroom?  Use night lights.  Make sure that you have a light by your bed that is easy to reach.  Do not use any sheets or blankets that are too big for your bed. They should not hang down onto the floor.  Have a firm chair that has side arms. You can use this for support while you get dressed.  Do not have throw rugs and other things on the floor that can make you trip. What can I do in the kitchen?  Clean up any spills right away.  Avoid walking on wet floors.  Keep items that you use a lot in easy-to-reach places.  If you need to reach something above you, use a strong step stool that has a grab bar.  Keep electrical cords out of the way.  Do not use floor polish or wax that makes floors slippery. If you must use wax, use non-skid floor wax.  Do not have throw rugs and other things on the floor that can make you trip. What can I do with my stairs?  Do not leave any items on the stairs.  Make sure that there are handrails on both sides of the stairs and use them. Fix handrails that are broken or loose. Make sure that handrails are as long as the stairways.  Check any carpeting to make sure that  it is firmly attached to the stairs. Fix any carpet that is loose or worn.  Avoid having throw rugs at the top or bottom of the stairs. If you do have throw rugs, attach them to the floor with carpet tape.  Make sure that you have a light switch at the top of the stairs and the bottom of the stairs. If you do not have them, ask someone to add them for you. What else can I do to help prevent falls?  Wear shoes that:  Do not have high heels.  Have rubber bottoms.  Are comfortable and fit you well.  Are closed at the toe. Do not wear sandals.  If you use a stepladder:  Make sure that it is fully opened. Do not climb a closed stepladder.  Make sure that both sides of the stepladder are locked into place.  Ask someone to hold it for you, if possible.  Clearly mark and make sure that you can see:  Any grab bars or handrails.  First and last steps.  Where the edge of each step is.  Use tools that help you move around (mobility aids) if they are needed. These include:  Canes.  Walkers.  Scooters.  Crutches.  Turn on the lights when you go into a dark area. Replace any light bulbs as soon as they burn out.  Set up your furniture so you have a clear path. Avoid moving your furniture around.  If any of your floors are uneven, fix them.  If there are any pets around you, be aware of where they are.  Review your medicines with your doctor. Some medicines can make you feel dizzy. This can increase your chance of falling. Ask your doctor what other things that you can do to help prevent falls. This information is not intended to replace advice given to you by your health care provider. Make sure you discuss any questions you have with your health care provider. Document Released: 01/05/2009 Document Revised: 08/17/2015 Document Reviewed: 04/15/2014  2017 Elsevier

## 2016-02-20 NOTE — Progress Notes (Signed)
Subjective:   Dawn Cooper is a 82 y.o. female who presents for Medicare Annual (Subsequent) preventive examination.  Review of Systems:  No ROS.  Medicare Wellness Visit.  Cardiac Risk Factors include: advanced age (>88men, >58 women);hypertension     Objective:     Vitals: BP 120/62 (BP Location: Left Arm, Patient Position: Sitting, Cuff Size: Normal)   Pulse 74   Temp 97.5 F (36.4 C) (Oral)   Resp 14   Ht 5\' 1"  (1.549 m)   Wt 135 lb (61.2 kg)   BMI 25.51 kg/m   Body mass index is 25.51 kg/m.   Tobacco History  Smoking Status  . Never Smoker  Smokeless Tobacco  . Never Used     Counseling given: Not Answered   Past Medical History:  Diagnosis Date  . A-fib ([place])   . Broken leg    right  . Cardiomyopathy   . Chronic kidney disease   . GERD (gastroesophageal reflux disease)   . Heart murmur   . Hiatal hernia   . Hypertension   . Mild mitral regurgitation by prior echocardiogram   . Moderate aortic stenosis   . Stroke ([place])   . Tricuspid regurgitation    Past Surgical History:  Procedure Laterality Date  . APPENDECTOMY    . CATARACT EXTRACTION  2013   left  . CHOLECYSTECTOMY    . FEMUR FRACTURE SURGERY  12/2011   Dr. Sabra Heck, s/p rehab at [facility]  . GALLBLADDER SURGERY    . VAGINAL DELIVERY     Family History  Problem Relation Age of Onset  . Stroke Sister   . Cancer Brother     pancreatic  . Cancer Sister     pancreatic  . Diabetes    . Diabetes Brother   . Heart attack Neg Hx    History  Sexual Activity  . Sexual activity: No    Outpatient Encounter Prescriptions as of 02/20/2016  Medication Sig  . aspirin EC 81 MG tablet Take 1 tablet (81 mg total) by mouth daily.  . clopidogrel (PLAVIX) 75 MG tablet TAKE 1 TABLET EVERY DAY  . furosemide (LASIX) 20 MG tablet Take 1 tablet (20 mg total) by mouth every other day.  . isosorbide mononitrate (IMDUR) 30 MG 24 hr tablet TAKE 1 TABLET EVERY DAY  . lisinopril (PRINIVIL,ZESTRIL) 5 MG  tablet Take 1 tablet (5 mg total) by mouth daily.  . metoprolol tartrate (LOPRESSOR) 25 MG tablet TAKE 1 TABLET TWICE DAILY  . pantoprazole (PROTONIX) 40 MG tablet TAKE 1 TABLET EVERY DAY  . potassium chloride (K-DUR,KLOR-CON) 10 MEQ tablet Take 1 tablet (10 mEq total) by mouth every other day.  . [DISCONTINUED] triamcinolone cream (KENALOG) 0.1 % Apply 1 application topically 2 (two) times daily.  . [EXPIRED] cyanocobalamin ((VITAMIN B-12)) injection 1,000 mcg    No facility-administered encounter medications on file as of 02/20/2016.     Activities of Daily Living In your present state of health, do you have any difficulty performing the following activities: 02/20/2016  Hearing? N  Vision? N  Difficulty concentrating or making decisions? Y  Walking or climbing stairs? Y  Dressing or bathing? N  Doing errands, shopping? N  Preparing Food and eating ? N  Using the Toilet? N  In the past six months, have you accidently leaked urine? N  Do you have problems with loss of bowel control? N  Managing your Medications? N  Managing your Finances? N  Housekeeping or managing your Housekeeping? N  Some recent data might be hidden    Patient Care Team: Leone Haven, MD as PCP - General (Family Medicine) Minna Merritts, MD as Consulting Physician (Cardiology) Lollie Sails, MD as Physician Assistant (Internal Medicine)    Assessment:    This is a routine wellness examination for [facility]. The goal of the wellness visit is to assist the patient how to close the gaps in care and create a preventative care plan for the patient.   Taking calcium VIT D as appropriate/Osteoporosis risk reviewed.  DEXA Scan deferred per patient request.  Educational material provided.  Medications reviewed; taking without issues or barriers.  Safety issues reviewed; lives with daughter. Smoke detectors in the home. Firearms locked in a safe within the home. Wears seatbelts when driving or  riding with others. No violence in the home.  No identified risk were noted; The patient was oriented x 3; appropriate in dress and manner and no objective failures at ADL's or IADL's. Cane/walker in use in and out of the home.  BMI; discussed the importance of a healthy diet, water intake and exercise. Educational material provided.  TDAP and ZOSTAVAX vaccine deferred at this time, per patient request.    Monthly B12 injection administered L deltoid, tolerated well.    Patient Concerns: None at this time. Follow up with PCP as needed.  Exercise Activities and Dietary recommendations Current Exercise Habits: Home exercise routine, Type of exercise: walking, Time (Minutes): 20, Frequency (Times/Week): 4, Weekly Exercise (Minutes/Week): 80, Intensity: Mild  Goals    . Healthy Lifestyle          STAY HYDRATED AND DRINK PLENTY OF FLUIDS STAY ACTIVE AND CONTINUE WALKING AND WORKING AROUND THE HOME FOR EXERCISE LOW CARB FOODS.  LEAN MEATS, VEGETABLES      Fall Risk Fall Risk  02/20/2016 06/14/2013  Falls in the past year? Yes No  Number falls in past yr: 2 or more -  Follow up Education provided;Falls prevention discussed -   Depression Screen PHQ 2/9 Scores 02/20/2016 06/14/2013  PHQ - 2 Score 1 0     Cognitive Function     6CIT Screen 02/20/2016  What Year? 0 points  What month? 0 points  What time? 0 points  Count back from 20 0 points  Months in reverse 0 points    Immunization History  Administered Date(s) Administered  . Influenza, High Dose Seasonal PF 12/12/2015  . Influenza,inj,Quad PF,36+ Mos 02/11/2013, 12/17/2013, 12/20/2014  . Influenza-Unspecified 12/25/2011  . Pneumococcal Conjugate-13 03/22/2014  . Pneumococcal Polysaccharide-23 12/25/2011   Screening Tests Health Maintenance  Topic Date Due  . TETANUS/TDAP  10[birth year]17/1939  . ZOSTAVAX  10/16/1980  . DEXA SCAN  10/16/1985  . INFLUENZA VACCINE  Completed  . PNA vac Low Risk Adult  Completed        Plan:    End of life planning; Advance aging; Advanced directives discussed. Copy of current HCPOA/Living Will requested.  Medicare Attestation I have personally reviewed: The patient's medical and social history Their use of alcohol, tobacco or illicit drugs Their current medications and supplements The patient's functional ability including ADLs,fall risks, home safety risks, cognitive, and hearing and visual impairment Diet and physical activities Evidence for depression   The patient's weight, height, BMI, and visual acuity have been recorded in the chart.  I have made referrals and provided education to the patient based on review of the above and I have provided the patient with a written personalized care plan for preventive  services.    During the course of the visit the patient was educated and counseled about the following appropriate screening and preventive services:   Vaccines to include Pneumoccal, Influenza, Hepatitis B, Td, Zostavax, HCV  Electrocardiogram  Cardiovascular Disease  Colorectal cancer screening  Bone density screening  Diabetes screening  Glaucoma screening  Mammography/PAP  Nutrition counseling   Patient Instructions (the written plan) was given to the patient.   Varney Biles, LPN  [id number]

## 2016-02-22 NOTE — Progress Notes (Signed)
I have reviewed the above note and agree.  Zygmund Passero, M.D.  

## 2016-03-05 ENCOUNTER — Other Ambulatory Visit: Payer: Self-pay | Admitting: *Deleted

## 2016-03-05 MED ORDER — POTASSIUM CHLORIDE CRYS ER 10 MEQ PO TBCR: 10.0000 meq | EXTENDED_RELEASE_TABLET | ORAL | 3 refills | Status: DC

## 2016-03-12 ENCOUNTER — Ambulatory Visit: Payer: Medicare Other | Admitting: Family Medicine

## 2016-03-26 ENCOUNTER — Ambulatory Visit: Payer: Medicare Other

## 2016-04-01 ENCOUNTER — Ambulatory Visit: Payer: Medicare Other

## 2016-04-01 ENCOUNTER — Encounter: Payer: Self-pay | Admitting: Family Medicine

## 2016-04-01 ENCOUNTER — Ambulatory Visit (INDEPENDENT_AMBULATORY_CARE_PROVIDER_SITE_OTHER): Payer: Medicare Other | Admitting: Family Medicine

## 2016-04-01 VITALS — BP 140/80 | HR 76 | Temp 97.7°F | Wt 138.0 lb

## 2016-04-01 DIAGNOSIS — I480 Paroxysmal atrial fibrillation: Secondary | ICD-10-CM | POA: Diagnosis not present

## 2016-04-01 DIAGNOSIS — I10 Essential (primary) hypertension: Secondary | ICD-10-CM

## 2016-04-01 DIAGNOSIS — E538 Deficiency of other specified B group vitamins: Secondary | ICD-10-CM | POA: Diagnosis not present

## 2016-04-01 LAB — VITAMIN B12: VITAMIN B 12: 575 pg/mL (ref 211–911)

## 2016-04-01 MED ORDER — CYANOCOBALAMIN 1000 MCG/ML IJ SOLN
1000.0000 ug | Freq: Once | INTRAMUSCULAR | Status: AC
  Administered 2016-04-01: 1000 ug via INTRAMUSCULAR

## 2016-04-01 NOTE — Progress Notes (Signed)
  Tommi Rumps, MD Phone: [phone]  Dawn Cooper is a 83 y.o. female who presents today for follow-up.  Hypertension: Reports it has been good at home but not reporting any numbers today. Taking metoprolol and lisinopril. Also takes Lasix every other day. No chest pain, shortness breath, or edema.  A. fib: No palpitations. Continues on metoprolol. Currently on Plavix and aspirin for anti-coagulation given falls risk. No bruising or bleeding.  B12 deficiency: Has been low in the past. She's not quite sure why she is on B12 still. No noted fatigue.  PMH: nonsmoker.   ROS see history of present illness  Objective  Physical Exam Vitals:   04/01/16 1450  BP: 140/80  Pulse: 76  Temp: 97.7 F (36.5 C)    BP Readings from Last 3 Encounters:  04/01/16 140/80  02/20/16 120/62  12/19/15 130/80   Wt Readings from Last 3 Encounters:  04/01/16 138 lb (62.6 kg)  02/20/16 135 lb (61.2 kg)  12/19/15 135 lb (61.2 kg)    Physical Exam  Constitutional: No distress.  Cardiovascular: Normal rate and regular rhythm.  Exam reveals no gallop and no friction rub.   Murmur (2/6 systolic murmur) heard. Pulmonary/Chest: Effort normal and breath sounds normal.  Musculoskeletal: She exhibits no edema.  Neurological: She is alert.  Skin: Skin is warm and dry. She is not diaphoretic.     Assessment/Plan: Please see individual problem list.  Atrial fibrillation ([place]) Rate controlled. Normal rhythm today. Continue aspirin and Plavix and metoprolol.  HTN (hypertension) At goal. Continue current medications.  B12 deficiency Low in the past. Possibly associated with anemia. We will recheck B12 today and give her B12 injection.   Orders Placed This Encounter  Procedures  . B12    Meds ordered this encounter  Medications  . cyanocobalamin ((VITAMIN B-12)) injection 1,000 mcg    Tommi Rumps, MD [place]

## 2016-04-01 NOTE — Assessment & Plan Note (Signed)
Low in the past. Possibly associated with anemia. We will recheck B12 today and give her B12 injection.

## 2016-04-01 NOTE — Assessment & Plan Note (Signed)
At goal. Continue current medications. 

## 2016-04-01 NOTE — Progress Notes (Signed)
Pre visit review using our clinic review tool, if applicable. No additional management support is needed unless otherwise documented below in the visit note. 

## 2016-04-01 NOTE — Assessment & Plan Note (Signed)
Rate controlled. Normal rhythm today. Continue aspirin and Plavix and metoprolol.

## 2016-04-01 NOTE — Patient Instructions (Signed)
Nice to see you. We will check your B12. Please continue your blood pressure medicines and your A. fib medicines. If you develop chest pain, shortness breath, palpitations, or any new or changing symptoms please seek medical attention medially.

## 2016-04-25 ENCOUNTER — Ambulatory Visit: Payer: Medicare Other | Admitting: Cardiovascular Disease

## 2016-05-07 ENCOUNTER — Ambulatory Visit (INDEPENDENT_AMBULATORY_CARE_PROVIDER_SITE_OTHER): Payer: Medicare Other

## 2016-05-07 DIAGNOSIS — E538 Deficiency of other specified B group vitamins: Secondary | ICD-10-CM | POA: Diagnosis not present

## 2016-05-07 MED ORDER — CYANOCOBALAMIN 1000 MCG/ML IJ SOLN
1000.0000 ug | Freq: Once | INTRAMUSCULAR | Status: AC
  Administered 2016-05-07: 1000 ug via INTRAMUSCULAR

## 2016-05-07 NOTE — Progress Notes (Signed)
Patient comes in for B 12 injection.  Injected left deltoid.  Patient tolerated injection well.  

## 2016-05-08 NOTE — Progress Notes (Signed)
I have reviewed the above note and agree with management.  Tommi Rumps, M.D.

## 2016-05-27 ENCOUNTER — Encounter: Payer: Self-pay | Admitting: Cardiovascular Disease

## 2016-05-27 ENCOUNTER — Ambulatory Visit (INDEPENDENT_AMBULATORY_CARE_PROVIDER_SITE_OTHER): Payer: Medicare Other | Admitting: Cardiovascular Disease

## 2016-05-27 VITALS — BP 136/72 | HR 66 | Ht 59.0 in | Wt 138.8 lb

## 2016-05-27 DIAGNOSIS — I480 Paroxysmal atrial fibrillation: Secondary | ICD-10-CM | POA: Diagnosis not present

## 2016-05-27 DIAGNOSIS — I6310 Cerebral infarction due to embolism of unspecified precerebral artery: Secondary | ICD-10-CM | POA: Diagnosis not present

## 2016-05-27 DIAGNOSIS — N183 Chronic kidney disease, stage 3 unspecified: Secondary | ICD-10-CM

## 2016-05-27 DIAGNOSIS — I129 Hypertensive chronic kidney disease with stage 1 through stage 4 chronic kidney disease, or unspecified chronic kidney disease: Secondary | ICD-10-CM

## 2016-05-27 DIAGNOSIS — I10 Essential (primary) hypertension: Secondary | ICD-10-CM

## 2016-05-27 DIAGNOSIS — I482 Chronic atrial fibrillation, unspecified: Secondary | ICD-10-CM

## 2016-05-27 DIAGNOSIS — I350 Nonrheumatic aortic (valve) stenosis: Secondary | ICD-10-CM

## 2016-05-27 DIAGNOSIS — Z8673 Personal history of transient ischemic attack (TIA), and cerebral infarction without residual deficits: Secondary | ICD-10-CM | POA: Diagnosis not present

## 2016-05-27 NOTE — Patient Instructions (Addendum)
Call for any significant shortness of breath   Medication Instructions:   No medication changes made  Labwork:  No new labs needed  Testing/Procedures:  We will schedule an echocardiogram in the next few months for severe aortic valve stenosis  Echocardiography is a painless test that uses sound waves to create images of your heart. It provides your doctor with information about the size and shape of your heart and how well your heart's chambers and valves are working. This procedure takes approximately one hour. There are no restrictions for this procedure.   I recommend watching educational videos on topics of interest to you at:       [email or web address]  Enter code: [email or web address]    Follow-Up: It was a pleasure seeing you in the office today. Please call us if you have new issues that need to be addressed before your next appt.  [phone]  Your physician wants you to follow-up in: 6 months.  You will receive a reminder letter in the mail two months in advance. If you don't receive a letter, please call our office to schedule the follow-up appointment.  If you need a refill on your cardiac medications before your next appointment, please call your pharmacy.    Echocardiogram An echocardiogram, or echocardiography, uses sound waves (ultrasound) to produce an image of your heart. The echocardiogram is simple, painless, obtained within a short period of time, and offers valuable information to your health care provider. The images from an echocardiogram can provide information such as:  Evidence of coronary artery disease (CAD).  Heart size.  Heart muscle function.  Heart valve function.  Aneurysm detection.  Evidence of a past heart attack.  Fluid buildup around the heart.  Heart muscle thickening.  Assess heart valve function. Tell a health care provider about:  Any allergies you have.  All medicines you are taking, including vitamins, herbs, eye drops, creams,  and over-the-counter medicines.  Any problems you or family members have had with anesthetic medicines.  Any blood disorders you have.  Any surgeries you have had.  Any medical conditions you have.  Whether you are pregnant or may be pregnant. What happens before the procedure? No special preparation is needed. Eat and drink normally. What happens during the procedure?  In order to produce an image of your heart, gel will be applied to your chest and a wand-like tool (transducer) will be moved over your chest. The gel will help transmit the sound waves from the transducer. The sound waves will harmlessly bounce off your heart to allow the heart images to be captured in real-time motion. These images will then be recorded.  You may need an IV to receive a medicine that improves the quality of the pictures. What happens after the procedure? You may return to your normal schedule including diet, activities, and medicines, unless your health care provider tells you otherwise. This information is not intended to replace advice given to you by your health care provider. Make sure you discuss any questions you have with your health care provider. Document Released: 03/08/2000 Document Revised: 10/28/2015 Document Reviewed: 11/16/2012 Elsevier Interactive Patient Education  2017 [employer].

## 2016-05-27 NOTE — Progress Notes (Signed)
Cardiology Office Note  Date:  05/27/2016   ID:  Dawn Cooper, DOB Apr 27, 1918, MRN [id number]  PCP:  Tommi Rumps, MD   Chief Complaint  Patient presents with  . other    6 month follow up. Meds reviewed by the pt. verbally. Pt. c/o feeling dizzy and weak all the time.     HPI:  Dawn Cooper is a very pleasant 83 year old woman with severe aortic valve stenosis,  atrial fibrillation (not on anticoagulation secondary to GI bleed) , history of GI bleed from AVM, history of stroke June 2016, documented on MRI, who presents for follow-up of her aortic valve stenosis , chronic atrial fibrillation  In follow-up today, she is doing well, denies shortness of breath on exertion. Walks slowly with a cane, no chest tightness, no near syncope or syncope. Denies any orthostasis symptoms. No recent falls, no leg swelling  Discussed echocardiogram results from September 2017 with her, Severe aortic valve stenosis, increase in valve velocity, mean and peak gradient Family with her today in the exam room/daughter  Previous Creatinine 1.4, stable Prior history of falls, none recently  EKG on today's visit shows atrial fibrillation with ventricular rate 66 bpm, left bundle branch block  Other past medical history reviewed  presented to the hospital on January 02 2011 with atrial fibrillation with RVR, elevated BNP, CT scan showing bilateral moderate sized pleural effusions who was started on rate control and pradaxa.  She refused cardioversion after 4 weeks on anticoagulation. At approximately 6 weeks out, she had a AVM bleed and presented to the hospital with melena and hematocrit of 18. She had been having melena for at least one week and did not go to the hospital. She had EGD with treatment of her AVM. All anticoagulation was held.   We had discussed the case with Dr. Gustavo Lah (GI) and it was felt that given her risk of further bleeding from additional AVMs and given her age and the extent  of her recent bleed, that no further anticoagulation be used.    on aspirin and Plavix , does not want stronger blood thinners as she is doing well  Previous EKG showing atrial fibrillation with left bundle branch block, rate in the 69s  admitted to the hospital 09/10/2014 for stroke, acute stroke on the left On MRI She recovered well despite initial slurred speech  Last echocardiogram 2/ 2016  hospitalization at the end of February 2016 with stroke type symptoms, stroke on MRI Family had reported a mechanical fall, imaging showed pubic ramus fracture, day prior to admission she was found to be unresponsive and would not answer verbal commands. She became more alert during the daytime, slurred speech. They brought her to the hospital 05/19/2014. She is lethargic, bradycardia, hypotension, given IV fluids with improvement of her symptoms. MRI showing multiple strokes, old Felt to be a high risk bleeding candidate, started on Plavix  Echocardiogram in the hospital showed ejection fraction 70%, moderate to severe aortic valve stenosis, moderate MR Carotid ultrasound 05/20/2014 showing moderate bilateral carotid arterial disease worse on the left  fall in November 2013. She broke her leg and had surgery with a rod placed on the right.   Echocardiogram while she was in atrial fibrillation showed ejection fraction 35-45%, normal left ventricular systolic pressures, moderate aortic valve stenosis with mean gradient of 32 mmHg  Previous lab work: total cholesterol 132, LDL 82, HDL 40, triglycerides 502, hemoglobin A1c 4.9  Previous CT Scan of the chest showing moderate bilateral pleural effusions  with compressive atelectasis in both lower lobes, no PE   PMH:   has a past medical history of A-fib ([place]); Broken leg; Cardiomyopathy; Chronic kidney disease; GERD (gastroesophageal reflux disease); Heart murmur; Hiatal hernia; Hypertension; Mild mitral regurgitation by prior  echocardiogram; Moderate aortic stenosis; Stroke [facility]); and Tricuspid regurgitation.  PSH:    Past Surgical History:  Procedure Laterality Date  . APPENDECTOMY    . CATARACT EXTRACTION  2013   left  . CHOLECYSTECTOMY    . FEMUR FRACTURE SURGERY  12/2011   Dr. Sabra Heck, s/p rehab at [facility]  . GALLBLADDER SURGERY    . VAGINAL DELIVERY      Current Outpatient Prescriptions  Medication Sig Dispense Refill  . aspirin EC 81 MG tablet Take 1 tablet (81 mg total) by mouth daily. 30 tablet 0  . clopidogrel (PLAVIX) 75 MG tablet Take 75 mg by mouth every other day.    . furosemide (LASIX) 20 MG tablet Take 1 tablet (20 mg total) by mouth every other day. 45 tablet 3  . isosorbide mononitrate (IMDUR) 30 MG 24 hr tablet TAKE 1 TABLET EVERY DAY 90 tablet 3  . lisinopril (PRINIVIL,ZESTRIL) 5 MG tablet Take 1 tablet (5 mg total) by mouth daily. 90 tablet 3  . metoprolol tartrate (LOPRESSOR) 25 MG tablet Take 12.5 mg by mouth 2 (two) times daily.    . pantoprazole (PROTONIX) 40 MG tablet TAKE 1 TABLET EVERY DAY 90 tablet 3  . potassium chloride (K-DUR,KLOR-CON) 10 MEQ tablet Take 1 tablet (10 mEq total) by mouth every other day. 135 tablet 3   No current facility-administered medications for this visit.      Allergies:   Pradaxa [dabigatran etexilate mesylate]; Clonidine derivatives; and Penicillins   Social History:  The patient  reports that she has never smoked. She has never used smokeless tobacco. She reports that she does not drink alcohol or use drugs.   Family History:   family history includes Cancer in her brother and sister; Diabetes in her brother; Stroke in her sister.    Review of Systems: Review of Systems  Constitutional: Negative.   Respiratory: Negative.   Cardiovascular: Negative.   Gastrointestinal: Negative.   Musculoskeletal: Negative.        Leg weakness  Neurological: Negative.   Psychiatric/Behavioral: Negative.   All other systems reviewed and are  negative.    PHYSICAL EXAM: VS:  BP 136/72 (BP Location: Left Arm, Patient Position: Sitting, Cuff Size: Normal)   Pulse 66   Ht 4\' 11"  (1.499 m)   Wt 138 lb 12 oz (62.9 kg)   BMI 28.02 kg/m  , BMI Body mass index is 28.02 kg/m. GEN: Well nourished, well developed, in no acute distress  HEENT: normal  Neck: no JVD, carotid bruits, or masses Cardiac: RRR; no murmurs, rubs, or gallops,no edema  Respiratory:  clear to auscultation bilaterally, normal work of breathing GI: soft, nontender, nondistended, + BS MS: no deformity or atrophy  Skin: warm and dry, no rash Neuro:  Strength and sensation are intact Psych: euthymic mood, full affect    Recent Labs: 10/20/2015: ALT 11; Hemoglobin 11.9; Platelets 189.0 12/12/2015: BUN 27; Creatinine, Ser 1.45; Potassium 4.5; Sodium 138    Lipid Panel Lab Results  Component Value Date   CHOL 168 09/10/2014   HDL 30 (L) 09/10/2014   LDLCALC 120 (H) 09/10/2014   TRIG 90 09/10/2014      Wt Readings from Last 3 Encounters:  05/27/16 138 lb 12 oz (62.9 kg)  04/01/16 138 lb (62.6 kg)  02/20/16 135 lb (61.2 kg)       ASSESSMENT AND PLAN:  Chronic atrial fibrillation (HCC) - Plan: EKG 12-Lead, ECHOCARDIOGRAM COMPLETE Rate relatively well-controlled Long discussion concerning her anticoagulation Currently on aspirin and Plavix given history of strokes. In up and she has declined warfarin or other more aggressive anticoagulation for atrial fibrillation. She is aware of risk of stroke on her current regimen. 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  She has declined aggressive anticoagulation for atrial fibrillation given history of GI bleeding  Essential hypertension - Plan: EKG 12-Lead, ECHOCARDIOGRAM COMPLETE Blood pressure is well controlled on today's visit. No changes made to the  medications.  Aortic valve stenosis, severe - Plan: EKG 12-Lead, ECHOCARDIOGRAM COMPLETE Long discussion with her today concerning her aortic valve We have offered her a appointments in [place] to discuss various treatment options for her valve. She has declined at this time but will consider in the future. We did discuss workup that fixing the valve would entail including cardiac catheterization, possible CT scan in preparation for TAVR.  Given her age, uncertain if she would be a candidate for sternotomy, bypass We have recommended repeat echocardiogram every 6 months  History of stroke No residual symptoms/deficits  CKD (chronic kidney disease), stage III  stable renal function, creatinine 1.4   Total encounter time more than 25 minutes  Greater than 50% was spent in counseling and coordination of care with the patient   Disposition:   F/U  6 months   Orders Placed This Encounter  Procedures  . EKG 12-Lead  . ECHOCARDIOGRAM COMPLETE     Signed, Esmond Plants, M.D., Ph.D. 05/27/2016  [place], [place]

## 2016-06-11 ENCOUNTER — Ambulatory Visit (INDEPENDENT_AMBULATORY_CARE_PROVIDER_SITE_OTHER): Payer: Medicare Other | Admitting: *Deleted

## 2016-06-11 DIAGNOSIS — E538 Deficiency of other specified B group vitamins: Secondary | ICD-10-CM | POA: Diagnosis not present

## 2016-06-11 MED ORDER — CYANOCOBALAMIN 1000 MCG/ML IJ SOLN
1000.0000 ug | Freq: Once | INTRAMUSCULAR | Status: AC
  Administered 2016-06-11: 1000 ug via INTRAMUSCULAR

## 2016-06-11 NOTE — Progress Notes (Signed)
Patient presented for B 2 injection to right deltoid, patient verbalized and showed no signs of distress during injection.

## 2016-06-12 NOTE — Progress Notes (Signed)
I have reviewed the above note and agree.  Hanna Ra, M.D.  

## 2016-06-24 ENCOUNTER — Other Ambulatory Visit: Payer: Medicare Other

## 2016-06-26 ENCOUNTER — Other Ambulatory Visit: Payer: Medicare Other

## 2016-07-01 ENCOUNTER — Encounter: Payer: Self-pay | Admitting: Family Medicine

## 2016-07-01 ENCOUNTER — Ambulatory Visit (INDEPENDENT_AMBULATORY_CARE_PROVIDER_SITE_OTHER): Payer: Medicare Other | Admitting: Family Medicine

## 2016-07-01 VITALS — BP 158/90 | HR 68 | Temp 97.9°F | Wt 142.2 lb

## 2016-07-01 DIAGNOSIS — K219 Gastro-esophageal reflux disease without esophagitis: Secondary | ICD-10-CM

## 2016-07-01 DIAGNOSIS — I10 Essential (primary) hypertension: Secondary | ICD-10-CM

## 2016-07-01 DIAGNOSIS — I350 Nonrheumatic aortic (valve) stenosis: Secondary | ICD-10-CM

## 2016-07-01 DIAGNOSIS — G479 Sleep disorder, unspecified: Secondary | ICD-10-CM | POA: Insufficient documentation

## 2016-07-01 DIAGNOSIS — N183 Chronic kidney disease, stage 3 unspecified: Secondary | ICD-10-CM

## 2016-07-01 DIAGNOSIS — I6310 Cerebral infarction due to embolism of unspecified precerebral artery: Secondary | ICD-10-CM | POA: Diagnosis not present

## 2016-07-01 LAB — COMPREHENSIVE METABOLIC PANEL
ALT: 12 U/L (ref 0–35)
AST: 25 U/L (ref 0–37)
Albumin: 3.6 g/dL (ref 3.5–5.2)
Alkaline Phosphatase: 70 U/L (ref 39–117)
BILIRUBIN TOTAL: 0.7 mg/dL (ref 0.2–1.2)
BUN: 24 mg/dL — AB (ref 6–23)
CO2: 27 meq/L (ref 19–32)
CREATININE: 1.41 mg/dL — AB (ref 0.40–1.20)
Calcium: 9.6 mg/dL (ref 8.4–10.5)
Chloride: 107 mEq/L (ref 96–112)
GFR: 36.78 mL/min — ABNORMAL LOW (ref >60.00)
GLUCOSE: 108 mg/dL — AB (ref 70–99)
Potassium: 4.1 mEq/L (ref 3.5–5.1)
Sodium: 140 mEq/L (ref 135–145)
Total Protein: 6.8 g/dL (ref 6.0–8.3)

## 2016-07-01 NOTE — Patient Instructions (Signed)
Nice to see you. We will check some lab work and contact her with the results. Please proceed with the echo as scheduled tomorrow.

## 2016-07-01 NOTE — Assessment & Plan Note (Signed)
Elevated today. Has been well controlled previously. Discussed continuing her current medications and starting to check it at home. Encouraged her to come back in a couple weeks for recheck.

## 2016-07-01 NOTE — Assessment & Plan Note (Signed)
Scheduled for echo tomorrow. Encouraged her to keep this appointment.

## 2016-07-01 NOTE — Progress Notes (Signed)
  Tommi Rumps, MD Phone: [phone]  Dawn Cooper is a 83 y.o. female who presents today for follow-up.  Hypertension: Patient notes blood pressure varies at home anywhere from the 120s to the 500B systolically. She notes no chest pain or shortness of breath or edema. Occasional lightheadedness. No presyncope or syncope. She is currently taking metoprolol and lisinopril. Takes Lasix every other day. She is due to have an echo tomorrow to follow-up on her aortic stenosis. This is being done through cardiology.  GERD: She notes no symptoms. No blood in her stool. No abdominal pain. No reflux. She takes Protonix every other day. Has a history of a gastric ulcer.  She does report she does not sleep well. She'll lay in bed for 12 hours at a time and is unsure exactly when she sleeps. She does not nap during the day. She does feel sleepy though no fatigue. She is very vague on her symptoms and at times just laughs when I ask about her sleep.   CKD stage III: Currently on lisinopril. She avoids NSAIDs. Notes she urinates normally.  PMH: nonsmoker.   ROS see history of present illness  Objective  Physical Exam Vitals:   07/01/16 1418  BP: (!) 158/90  Pulse: 68  Temp: 97.9 F (36.6 C)    BP Readings from Last 3 Encounters:  07/01/16 (!) 158/90  05/27/16 136/72  04/01/16 140/80   Wt Readings from Last 3 Encounters:  07/01/16 142 lb 3.2 oz (64.5 kg)  05/27/16 138 lb 12 oz (62.9 kg)  04/01/16 138 lb (62.6 kg)    Physical Exam  Constitutional: No distress.  Cardiovascular: Normal rate and regular rhythm.   Murmur (2/6 systolic) heard. Pulmonary/Chest: Effort normal and breath sounds normal.  Abdominal: Soft. Bowel sounds are normal. She exhibits no distension. There is no tenderness. There is no rebound and no guarding.  Musculoskeletal: She exhibits no edema.  Neurological: She is alert. Gait normal.  Skin: Skin is warm. She is not diaphoretic.     Assessment/Plan:  Please see individual problem list.  HTN (hypertension) Elevated today. Has been well controlled previously. Discussed continuing her current medications and starting to check it at home. Encouraged her to come back in a couple weeks for recheck.  CKD (chronic kidney disease), stage III Renal function checked today. Advised to avoid NSAIDs. Continue lisinopril.  GERD (gastroesophageal reflux disease) Well-controlled. Continue Protonix.  Sleeping difficulty Very vague complaint. Encouraged her to continue to monitor and if it becomes more bothersome let us know.  Aortic valve stenosis, severe Scheduled for echo tomorrow. Encouraged her to keep this appointment.   Orders Placed This Encounter  Procedures  . Comp Met (CMET)   Tommi Rumps, MD [place]

## 2016-07-01 NOTE — Progress Notes (Signed)
Pre visit review using our clinic review tool, if applicable. No additional management support is needed unless otherwise documented below in the visit note. 

## 2016-07-01 NOTE — Assessment & Plan Note (Signed)
Very vague complaint. Encouraged her to continue to monitor and if it becomes more bothersome let us know.

## 2016-07-01 NOTE — Assessment & Plan Note (Signed)
Renal function checked today. Advised to avoid NSAIDs. Continue lisinopril.

## 2016-07-01 NOTE — Assessment & Plan Note (Signed)
Well-controlled.  Continue Protonix. 

## 2016-07-02 ENCOUNTER — Other Ambulatory Visit: Payer: Self-pay

## 2016-07-02 ENCOUNTER — Ambulatory Visit (INDEPENDENT_AMBULATORY_CARE_PROVIDER_SITE_OTHER): Payer: Medicare Other

## 2016-07-02 DIAGNOSIS — I350 Nonrheumatic aortic (valve) stenosis: Secondary | ICD-10-CM | POA: Diagnosis not present

## 2016-07-02 DIAGNOSIS — I6310 Cerebral infarction due to embolism of unspecified precerebral artery: Secondary | ICD-10-CM | POA: Diagnosis not present

## 2016-07-02 DIAGNOSIS — I482 Chronic atrial fibrillation, unspecified: Secondary | ICD-10-CM

## 2016-07-02 DIAGNOSIS — I10 Essential (primary) hypertension: Secondary | ICD-10-CM

## 2016-07-03 ENCOUNTER — Encounter: Payer: Self-pay | Admitting: *Deleted

## 2016-07-05 ENCOUNTER — Telehealth: Payer: Self-pay | Admitting: *Deleted

## 2016-07-05 DIAGNOSIS — I350 Nonrheumatic aortic (valve) stenosis: Secondary | ICD-10-CM

## 2016-07-05 NOTE — Telephone Encounter (Signed)
Spoke with patients daughter per release form. Reviewed recommendations per Dr. Rockey Situ and she verbalized understanding, agreement to her seeing Dr. Burt Knack, and had no further questions at this time. Let her know that someone would be calling possibly next week to schedule appointment and she states that she prefers afternoon if possible. Let her know that I would pass that information on. She was appreciative for the call and had no further questions.

## 2016-07-05 NOTE — Telephone Encounter (Signed)
Left message on machine for pt's daughter Katharine Look to contact the office. I have tentatively scheduled the pt to see Dr Burt Knack on 07/12/16.

## 2016-07-05 NOTE — Telephone Encounter (Signed)
-----   Message from Dawn Merritts, MD sent at 07/04/2016 10:43 PM EDT ----- Aortic valve stenosis is critical Can we arrange an appt for her to meet Dr. Burt Knack in [place] to discuss options for fixing her aortic valve? T[facility] Would strongly encourage her to meet with them

## 2016-07-08 NOTE — Telephone Encounter (Signed)
I spoke with the pt's daughter and made her aware of TAVR appointment with Dr Burt Knack on 07/12/16.

## 2016-07-12 ENCOUNTER — Ambulatory Visit (INDEPENDENT_AMBULATORY_CARE_PROVIDER_SITE_OTHER): Payer: Medicare Other | Admitting: Cardiovascular Disease

## 2016-07-12 ENCOUNTER — Encounter: Payer: Self-pay | Admitting: Cardiovascular Disease

## 2016-07-12 VITALS — BP 130/82 | HR 78 | Ht 59.0 in | Wt 141.6 lb

## 2016-07-12 DIAGNOSIS — I350 Nonrheumatic aortic (valve) stenosis: Secondary | ICD-10-CM

## 2016-07-12 DIAGNOSIS — I6310 Cerebral infarction due to embolism of unspecified precerebral artery: Secondary | ICD-10-CM

## 2016-07-12 NOTE — Progress Notes (Signed)
Cardiology Office Note Date:  07/12/2016   ID:  Dawn Cooper, DOB [birth year], MRN [id number]  PCP:  Tommi Rumps, MD  Cardiologist:  Dr Rockey Situ   Chief Complaint  Patient presents with  . TAVR consult     History of Present Illness: Dawn Cooper is a 83 y.o. female who presents for evaluation of severe aortic stenosis. She is referred by Dr Rockey Situ for consideration of TAVR.   The patient worked at [employer] for over 30 years. She's been retired for many years. She lives with her daughter in [place]. The patient has been followed for aortic stenosis and previously has been asymptomatic, but over the last several months she has developed progressive symptoms and her echocardiogram now shows progression of aortic stenosis and at the severe/critical range.  The patient is not active. States that she has become "lazy." She attributes this to fatigue and shortness of breath. She denies orthopnea, PND, chest pain, or heart palpitations. She feels dizzy all the time but does not really have dizziness brought on by physical exertion. She is also followed for permanent atrial fibrillation. She is not able to take oral anticoagulation because of GI bleeding related to AVMs. The patient has a history of stroke in June 2016 documented on brain MRI. She does not have any major sequelae of her stroke.  Past Medical History:  Diagnosis Date  . A-fib ([place])   . Broken leg    right  . Cardiomyopathy   . Chronic kidney disease   . GERD (gastroesophageal reflux disease)   . Heart murmur   . Hiatal hernia   . Hypertension   . Mild mitral regurgitation by prior echocardiogram   . Moderate aortic stenosis   . Stroke ([place])   . Tricuspid regurgitation     Past Surgical History:  Procedure Laterality Date  . APPENDECTOMY    . CATARACT EXTRACTION  2013   left  . CHOLECYSTECTOMY    . FEMUR FRACTURE SURGERY  12/2011   Dr. Sabra Heck, s/p rehab at [facility]  . GALLBLADDER SURGERY      . VAGINAL DELIVERY      Current Outpatient Prescriptions  Medication Sig Dispense Refill  . aspirin EC 81 MG tablet Take 1 tablet (81 mg total) by mouth daily. 30 tablet 0  . clopidogrel (PLAVIX) 75 MG tablet Take 75 mg by mouth every other day.    . furosemide (LASIX) 20 MG tablet Take 1 tablet (20 mg total) by mouth every other day. 45 tablet 3  . isosorbide mononitrate (IMDUR) 30 MG 24 hr tablet TAKE 1 TABLET EVERY DAY 90 tablet 3  . lisinopril (PRINIVIL,ZESTRIL) 5 MG tablet Take 1 tablet (5 mg total) by mouth daily. 90 tablet 3  . metoprolol tartrate (LOPRESSOR) 25 MG tablet Take 12.5 mg by mouth 2 (two) times daily.    . pantoprazole (PROTONIX) 40 MG tablet TAKE 1 TABLET EVERY DAY 90 tablet 3  . potassium chloride (K-DUR,KLOR-CON) 10 MEQ tablet Take 1 tablet (10 mEq total) by mouth every other day. 135 tablet 3   No current facility-administered medications for this visit.     Allergies:   Pradaxa [dabigatran etexilate mesylate]; Clonidine derivatives; and Penicillins   Social History:  The patient  reports that she has never smoked. She has never used smokeless tobacco. She reports that she does not drink alcohol or use drugs.   Family History:  The patient's  family history includes Cancer in her brother and sister; Diabetes in her  brother; Stroke in her sister.   ROS:  Please see the history of present illness.  Otherwise, review of systems is positive for memory loss.  All other systems are reviewed and negative.   PHYSICAL EXAM: VS:  BP 130/82   Pulse 78   Ht 4\' 11"  (1.499 m)   Wt 141 lb 9.6 oz (64.2 kg)   SpO2 99%   BMI 28.60 kg/m  , BMI Body mass index is 28.6 kg/m. GEN: Well nourished, well developed, pleasant elderly woman in no acute distress  HEENT: normal  Neck: no JVD, no masses. bilateral carotid bruits with delayed upstrokes Cardiac: irregularly irregular with 3/6 harsh late peaking systolic murmur at the apex, A2 is absent               Respiratory:  clear  to auscultation bilaterally, normal work of breathing GI: soft, nontender, nondistended, + BS MS: no deformity or atrophy  Ext: no pretibial edema, pedal pulses 2+= bilaterally Skin: warm and dry, no rash Neuro:  Strength and sensation are intact Psych: euthymic mood, full affect  EKG:  EKG is not ordered today.  Recent Labs: 10/20/2015: Hemoglobin 11.9; Platelets 189.0 07/01/2016: ALT 12; BUN 24; Creatinine, Ser 1.41; Potassium 4.1; Sodium 140   Lipid Panel     Component Value Date/Time   CHOL 168 09/10/2014 0458   TRIG 90 09/10/2014 0458   HDL 30 (L) 09/10/2014 0458   CHOLHDL 5.6 09/10/2014 0458   VLDL 18 09/10/2014 0458   LDLCALC 120 (H) 09/10/2014 0458      Wt Readings from Last 3 Encounters:  07/12/16 141 lb 9.6 oz (64.2 kg)  07/01/16 142 lb 3.2 oz (64.5 kg)  05/27/16 138 lb 12 oz (62.9 kg)     Cardiac Studies Reviewed: 2D echo: Left ventricle:  The cavity size was normal. There was moderate concentric hypertrophy. Systolic function was normal. The estimated ejection fraction was in the range of 55% to 60%. Wall motion was normal; there were no regional wall motion abnormalities. The study was not technically sufficient to allow evaluation of LV diastolic dysfunction due to atrial fibrillation.  ------------------------------------------------------------------- Aortic valve:   Trileaflet; moderately thickened, severely calcified leaflets. Mobility was not restricted.  Doppler:   There was critical stenosis.   There was mild regurgitation.    VTI ratio of LVOT to aortic valve: 0.14. Valve area (VTI): 0.31 cm^2. Indexed valve area (VTI): 0.18 cm^2/m^2. Mean velocity ratio of LVOT to aortic valve: 0.13. Valve area (Vmean): 0.3 cm^2. Indexed valve area (Vmean): 0.18 cm^2/m^2.    Mean gradient (S): 41 mm Hg. Peak gradient (S): 72 mm Hg.  ------------------------------------------------------------------- Aorta:  Aortic root: The aortic root was normal in  size.  ------------------------------------------------------------------- Mitral valve:   Calcified annulus. Moderately thickened, mildly calcified leaflets . Mobility was not restricted.  Doppler: Transvalvular velocity was within the normal range. There was no evidence for stenosis. There was moderate regurgitation. Planimetered valve area: 2.02 cm^2. Indexed valve area by planimetry: 1.22 cm^2/m^2. Valve area by pressure half-time: 3.93 cm^2. Indexed valve area by pressure half-time: 2.36 cm^2/m^2. Peak gradient (D): 7 mm Hg.  ------------------------------------------------------------------- Left atrium:  The atrium was moderately dilated.  ------------------------------------------------------------------- Right ventricle:  The cavity size was normal. Wall thickness was normal. Systolic function was normal.  ------------------------------------------------------------------- Pulmonic valve:    Doppler:  Transvalvular velocity was within the normal range. There was no evidence for stenosis.  ------------------------------------------------------------------- Tricuspid valve:   Structurally normal valve.    Doppler: Transvalvular velocity  was within the normal range. There was mild regurgitation.  ------------------------------------------------------------------- Pulmonary artery:   The main pulmonary artery was normal-sized. Systolic pressure was moderately increased.  ------------------------------------------------------------------- Right atrium:  The atrium was mildly dilated.  ------------------------------------------------------------------- Pericardium:  There was no pericardial effusion.  ------------------------------------------------------------------- Systemic veins: Inferior vena cava: The vessel was mildly dilated. The respirophasic diameter changes were blunted (<  50%).  ------------------------------------------------------------------- Measurements   Left ventricle                           Value          Reference  LV ID, ED, PLAX chordal          (L)     34    mm       43 - 52  LV ID, ES, PLAX chordal                  25    mm       23 - 38  LV fx shortening, PLAX chordal   (L)     26    %        >=29  LV PW thickness, ED                      13    mm       ----------  IVS/LV PW ratio, ED              (H)     1.38           <=1.3  Stroke volume, 2D                        35    ml       ----------  Stroke volume/bsa, 2D                    21    ml/m^2   ----------  LV e&', lateral                           8.51  cm/s     ----------  LV E/e&', lateral                         15.75          ----------  LV e&', medial                            4.46  cm/s     ----------  LV E/e&', medial                          30.04          ----------  LV e&', average                           6.49  cm/s     ----------  LV E/e&', average                         20.66          ----------    Ventricular septum  Value          Reference  IVS thickness, ED                        18    mm       ----------    LVOT                                     Value          Reference  LVOT ID, S                               17    mm       ----------  LVOT area                                2.27  cm^2     ----------  LVOT ID                                  17    mm       ----------  LVOT mean velocity, S                    39.2  cm/s     ----------  LVOT VTI, S                              15.4  cm       ----------  Stroke volume (SV), LVOT DP              35    ml       ----------  Stroke index (SV/bsa), LVOT DP           21    ml/m^2   ----------    Aortic valve                             Value          Reference  Aortic valve peak velocity, S            424   cm/s     ----------  Aortic valve mean velocity, S            299   cm/s      ----------  Aortic valve VTI, S                      114   cm       ----------  Aortic mean gradient, S                  41    mm Hg    ----------  Aortic peak gradient, S                  72    mm Hg    ----------  VTI ratio, LVOT/AV                       0.14           ----------  Aortic valve area, VTI                   0.31  cm^2     ----------  Aortic valve area/bsa, VTI               0.18  cm^2/m^2 ----------  Velocity ratio, mean, LVOT/AV            0.13           ----------  Aortic valve area, mean velocity         0.3   cm^2     ----------  Aortic valve area/bsa, mean              0.18  cm^2/m^2 ----------  velocity    Aorta                                    Value          Reference  Aortic root ID, ED                       26    mm       ----------  Ascending aorta ID, A-P, S               32    mm       ----------    Left atrium                              Value          Reference  LA ID, A-P, ES                           41    mm       ----------  LA ID/bsa, A-P                   (H)     2.47  cm/m^2   <=2.2  LA volume, S                             82.5  ml       ----------  LA volume/bsa, S                         49.6  ml/m^2   ----------  LA volume, ES, 1-p A4C                   82.2  ml       ----------  LA volume/bsa, ES, 1-p A4C               49.5  ml/m^2   ----------  LA volume, ES, 1-p A2C                   77.3  ml       ----------  LA volume/bsa, ES, 1-p A2C               46.5  ml/m^2   ----------    Mitral valve                             Value  Reference  Mitral valve area, planimetry            2.02  cm^2     ----------  Mitral valve area/bsa,                   1.22  cm^2/m^2 ----------  planimetry  Mitral E-wave peak velocity              134   cm/s     ----------  Mitral deceleration time         (L)     141   ms       150 - 230  Mitral pressure half-time                41    ms       ----------  Mitral peak gradient, D                  7     mm  Hg    ----------  Mitral valve area, PHT, DP               3.93  cm^2     ----------  Mitral valve area/bsa, PHT, DP           2.36  cm^2/m^2 ----------    Pulmonary arteries                       Value          Reference  PA pressure, S, DP               (H)     55    mm Hg    <=30    Right atrium                             Value          Reference  RA ID, S-I, ES, A4C              (H)     51.9  mm       34 - 49  RA area, ES, A4C                 (H)     21.3  cm^2     8.3 - 19.5  RA volume, ES, A/L                       70    ml       ----------  RA volume/bsa, ES, A/L                   42.1  ml/m^2   ----------    Right ventricle                          Value          Reference  TAPSE                                    14.7  mm       ----------  RV s&', lateral, S                        7.51  cm/s     ----------  Pulmonic valve                           Value          Reference  Pulmonic valve peak velocity, S          65.5  cm/s     ----------  STS RISK CALCULATOR: Procedure: AV Replacement  Risk of Mortality: 9.782%  Morbidity or Mortality: 39.787%  Long Length of Stay: 25.507%  Short Length of Stay: 6.751%  Permanent Stroke: 6.904%  Prolonged Ventilation: 28.663%  DSW Infection: 0.123%  Renal Failure: 14.097%  Reoperation: 13.138%   ASSESSMENT AND PLAN: Severe, Stage D, Aortic stenosis with chronic diastolic heart failure NYHA II symptoms. The patient's echo study is reviewed and demonstrates severe/critical aortic stenosis with preserved LV systolic function. The aortic valve is severely calcified and restricted. The patient's exam is also consistent with severe aortic stenosis with a harsh late peaking murmur and inaudible aortic valve closure.    I have reviewed the natural history of aortic stenosis with the patient and her daughter who is present today. We have discussed the limitations of medical therapy and the poor prognosis associated with symptomatic aortic  stenosis. We have reviewed potential treatment options, including palliative medical therapy, conventional surgical aortic valve replacement, and transcatheter aortic valve replacement. We discussed treatment options in the context of this patient's specific comorbid medical conditions.   The patient would be at very high risk of conventional surgery in the context of her advanced age, reduced functional capacity, and comorbid medical conditions. TAVR would be a reasonable consideration. We discussed considerations of TAVR versus palliative medical therapy in the context of her advanced age. She clearly has progressive symptoms of severe aortic stenosis with increasing fatigue and shortness of breath. She has some personal issues that she would like to sort out and wants to delay any further consideration of treatment for approximately one month. We had a lengthy discussion regarding procedural expectations of TAVR, typical recovery, and potential complications. The TAVR procedural animation was shown to her today. All of her questions are answered. I will arrange a follow-up visit in approximately 4 weeks before scheduling any further testing. She understands that if she decides to proceed she will require a right and left heart catheterization, CT angiogram studies of the heart and abdominal and pelvic vessels, and formal cardiac surgical evaluation.  Current medicines are reviewed with the patient today.  The patient does not have concerns regarding medicines.  Labs/ tests ordered today include:  No orders of the defined types were placed in this encounter.  Disposition:   FU 1 month  Time spent conducting this evaluation is one hour, over half of this is spent in direct counseling and coordination of care related to her aortic stenosis as detailed above.   Deatra James, MD  07/12/2016 4:01 PM    [facility] [place], [place], [place]  [address] Phone: [phone]; Fax: [phone]

## 2016-07-12 NOTE — Patient Instructions (Signed)
Medication Instructions:  Your physician recommends that you continue on your current medications as directed. Please refer to the Current Medication list given to you today.  Labwork: No new orders.   Testing/Procedures: No new orders.   Follow-Up: Please contact the office at [phone] if you would like to proceed with further TAVR evaluation prior to one month follow-up.   Your physician recommends that you schedule a follow-up appointment in: 1 MONTH with Dr Burt Knack   Any Other Special Instructions Will Be Listed Below (If Applicable).     If you need a refill on your cardiac medications before your next appointment, please call your pharmacy.

## 2016-07-16 ENCOUNTER — Ambulatory Visit (INDEPENDENT_AMBULATORY_CARE_PROVIDER_SITE_OTHER): Payer: Medicare Other | Admitting: *Deleted

## 2016-07-16 DIAGNOSIS — E538 Deficiency of other specified B group vitamins: Secondary | ICD-10-CM

## 2016-07-16 MED ORDER — CYANOCOBALAMIN 1000 MCG/ML IJ SOLN
1000.0000 ug | Freq: Once | INTRAMUSCULAR | Status: AC
  Administered 2016-07-16: 1000 ug via INTRAMUSCULAR

## 2016-07-16 NOTE — Progress Notes (Signed)
I have reviewed the above note and agree.  Axie Hayne, M.D.  

## 2016-07-16 NOTE — Progress Notes (Signed)
Patient presented for B 12 injection into left deltoid patient voiced no concern or showed any sign of distress during injection.

## 2016-07-19 ENCOUNTER — Telehealth: Payer: Self-pay | Admitting: Cardiovascular Disease

## 2016-07-19 ENCOUNTER — Encounter: Payer: Self-pay | Admitting: Cardiovascular Disease

## 2016-07-19 DIAGNOSIS — I350 Nonrheumatic aortic (valve) stenosis: Secondary | ICD-10-CM

## 2016-07-19 NOTE — Telephone Encounter (Signed)
I spoke with the Dawn Cooper's daughter and at this time the Dawn Cooper may want to postpone testing until the end of May but the Dawn Cooper is still thinking at this time.  I made Katharine Look aware of the test scheduled on 07/23/16 and at this time the Dawn Cooper will think about things over the weekend and I will touch base with them on Monday.

## 2016-07-19 NOTE — Telephone Encounter (Signed)
I spoke with Dawn Cooper and made her aware of needed appointments for TAVR evaluation.   I have scheduled the pt for CTA chest/abdomen and pelvis on 07/23/16 at 12:00.  The pt will need Bicarb so she has to arrive at 10:30 AM, Entrance A [place]. Pre test instructions: no solid food 4 hours prior to test, liquids okay, No caffeine or smoking 4 hours prior. No herbal supplements by mouth 24 hours prior to test. Do not take Furosemide or Lisinopril the day before or the day of test.  BMP will need to be rechecked on Friday 07/26/16. Lab hours 7:30-5:00.  Pt scheduled for Cardiac CT on 07/29/16 at 2:30. The pt will need Bicarb so she has to arrive at 12:00, Entrance A [place].  Pre test instructions: no solid food 4 hours prior to test, liquids okay, No caffeine or smoking 4 hours prior. No herbal supplements by mouth 24 hours prior to test. Do not take Furosemide or Lisinopril the day before or the day of test.     [place] [facility] [address], [address] [place] [address] Dept: [phone] Loc: [phone]  Dawn Cooper  07/19/2016  You are scheduled for a Cardiac Catheterization on Wednesday, May 16 with Dr. Sherren Mocha.  1. Please arrive at the [facility] (Main Entrance A) at [facility]: [address] [place], [place] [address] at 9:30 AM (two hours before your procedure to ensure your preparation). Free valet parking service is available.   Special note: Every effort is made to have your procedure done on time. Please understand that emergencies sometimes delay scheduled procedures.  2. Diet: Do not eat or drink anything after midnight prior to your procedure except sips of water to take medications.  3. Labs: Your labs will be performed at the hospital after you arrive for your procedure.  4. Medication instructions in preparation for your procedure:  Do not take Furosemide or  Lisinopril the morning of procedure.    On the morning of your procedure, take your Aspirin and Plavix/Clopidogrel and any morning medicines NOT listed above.  You may use sips of water.  5. Plan for one night stay--bring personal belongings.  6. Bring a current list of your medications and current insurance cards.  7. You MUST have a responsible person to drive you home.  8. Someone MUST be with you the first 24 hours after you arrive home or your discharge will be delayed.  9. Please wear clothes that are easy to get on and off and wear slip-on shoes.  Thank you for allowing [place] to care for you!   -- [facility]

## 2016-07-19 NOTE — Telephone Encounter (Signed)
New message     Pt would like to schedule the test for the heart valve replacement please call her to make these arrangements

## 2016-07-19 NOTE — Telephone Encounter (Signed)
New Message     If there is no answer at her mom's number please call Dawn Cooper at her cell

## 2016-07-19 NOTE — Telephone Encounter (Signed)
Follow up  Pts daughter voiced would like to speak with Lauren.  Please f/u

## 2016-07-22 ENCOUNTER — Other Ambulatory Visit: Payer: Self-pay | Admitting: *Deleted

## 2016-07-22 DIAGNOSIS — I350 Nonrheumatic aortic (valve) stenosis: Secondary | ICD-10-CM

## 2016-07-22 NOTE — Telephone Encounter (Signed)
I spoke with Dawn Cooper and made her aware of all pending TAVR appts and answered questions.

## 2016-07-22 NOTE — Telephone Encounter (Signed)
I spoke with Dawn Cooper and the pt would like to proceed with TAVR evaluation as scheduled.  Dawn Cooper has activated the pt's my chart account and I have sent her a copy of pretest instructions.  Dawn Cooper will review this information today and I will call her later today to answer any questions she may have.

## 2016-07-23 ENCOUNTER — Ambulatory Visit (HOSPITAL_COMMUNITY): Payer: Medicare Other

## 2016-07-23 ENCOUNTER — Ambulatory Visit (HOSPITAL_COMMUNITY)
Admission: RE | Admit: 2016-07-23 | Discharge: 2016-07-23 | Disposition: A | Discharge status: Home / Self Care | Payer: Medicare Other | Source: Ambulatory Visit | Attending: Cardiovascular Disease | Admitting: Cardiovascular Disease

## 2016-07-23 ENCOUNTER — Telehealth: Payer: Self-pay | Admitting: Cardiovascular Disease

## 2016-07-23 ENCOUNTER — Encounter (HOSPITAL_COMMUNITY): Payer: Self-pay

## 2016-07-23 DIAGNOSIS — I700 Atherosclerosis of aorta: Secondary | ICD-10-CM | POA: Insufficient documentation

## 2016-07-23 DIAGNOSIS — K5730 Diverticulosis of large intestine without perforation or abscess without bleeding: Secondary | ICD-10-CM | POA: Diagnosis not present

## 2016-07-23 DIAGNOSIS — Z01818 Encounter for other preprocedural examination: Secondary | ICD-10-CM | POA: Diagnosis not present

## 2016-07-23 DIAGNOSIS — I2510 Atherosclerotic heart disease of native coronary artery without angina pectoris: Secondary | ICD-10-CM | POA: Insufficient documentation

## 2016-07-23 DIAGNOSIS — I350 Nonrheumatic aortic (valve) stenosis: Secondary | ICD-10-CM | POA: Diagnosis not present

## 2016-07-23 MED ORDER — SODIUM BICARBONATE 8.4 % IV SOLN
INTRAVENOUS | Status: AC
  Administered 2016-07-23: 13:00:00 via INTRAVENOUS
  Filled 2016-07-23: qty 500

## 2016-07-23 MED ORDER — SODIUM BICARBONATE BOLUS VIA INFUSION
INTRAVENOUS | Status: AC
  Administered 2016-07-23: 205 meq via INTRAVENOUS
  Filled 2016-07-23: qty 1

## 2016-07-23 MED ORDER — IOPAMIDOL (ISOVUE-370) INJECTION 76%
INTRAVENOUS | Status: AC
  Administered 2016-07-23: 80 mL
  Filled 2016-07-23: qty 100

## 2016-07-23 NOTE — Telephone Encounter (Signed)
Received STAT result from [facility] about a possible left atrial appendage thrombus on a CT scan of abdomen/pelvis. Report routed to  Dr. Burt Knack for further review.

## 2016-07-23 NOTE — Telephone Encounter (Signed)
New message     Ct angio abdominal and pelvis results

## 2016-07-24 ENCOUNTER — Other Ambulatory Visit: Payer: Self-pay | Admitting: *Deleted

## 2016-07-24 DIAGNOSIS — I350 Nonrheumatic aortic (valve) stenosis: Secondary | ICD-10-CM

## 2016-07-24 DIAGNOSIS — N289 Disorder of kidney and ureter, unspecified: Secondary | ICD-10-CM

## 2016-07-26 ENCOUNTER — Other Ambulatory Visit: Payer: Medicare Other | Admitting: *Deleted

## 2016-07-26 DIAGNOSIS — I350 Nonrheumatic aortic (valve) stenosis: Secondary | ICD-10-CM | POA: Diagnosis not present

## 2016-07-27 LAB — BASIC METABOLIC PANEL
BUN/Creatinine Ratio: 14 (ref 12–28)
BUN: 23 mg/dL (ref 10–36)
CHLORIDE: 104 mmol/L (ref 96–106)
CO2: 25 mmol/L (ref 18–29)
CREATININE: 1.63 mg/dL — AB (ref 0.57–1.00)
Calcium: 9.6 mg/dL (ref 8.7–10.3)
GFR calc Af Amer: 31 mL/min/{1.73_m2} — ABNORMAL LOW (ref >59)
GFR calc non Af Amer: 27 mL/min/{1.73_m2} — ABNORMAL LOW (ref >59)
GLUCOSE: 126 mg/dL — AB (ref 65–99)
Potassium: 4.8 mmol/L (ref 3.5–5.2)
SODIUM: 141 mmol/L (ref 134–144)

## 2016-07-29 ENCOUNTER — Ambulatory Visit (HOSPITAL_COMMUNITY)
Admission: RE | Admit: 2016-07-29 | Discharge: 2016-07-29 | Disposition: A | Discharge status: Home / Self Care | Payer: Medicare Other | Source: Ambulatory Visit | Attending: Cardiovascular Disease | Admitting: Cardiovascular Disease

## 2016-07-29 ENCOUNTER — Ambulatory Visit (HOSPITAL_BASED_OUTPATIENT_CLINIC_OR_DEPARTMENT_OTHER)
Admission: RE | Admit: 2016-07-29 | Discharge: 2016-07-29 | Disposition: A | Discharge status: Home / Self Care | Payer: Medicare Other | Source: Ambulatory Visit | Attending: Cardiovascular Disease | Admitting: Cardiovascular Disease

## 2016-07-29 ENCOUNTER — Other Ambulatory Visit (HOSPITAL_COMMUNITY): Payer: Self-pay | Admitting: Cardiovascular Disease

## 2016-07-29 ENCOUNTER — Encounter: Payer: Self-pay | Admitting: Physical Therapy

## 2016-07-29 ENCOUNTER — Ambulatory Visit: Payer: Medicare Other | Attending: Cardiovascular Disease | Admitting: Physical Therapy

## 2016-07-29 ENCOUNTER — Encounter (HOSPITAL_COMMUNITY): Payer: Self-pay

## 2016-07-29 DIAGNOSIS — I350 Nonrheumatic aortic (valve) stenosis: Secondary | ICD-10-CM | POA: Insufficient documentation

## 2016-07-29 DIAGNOSIS — I771 Stricture of artery: Secondary | ICD-10-CM | POA: Diagnosis not present

## 2016-07-29 DIAGNOSIS — I6523 Occlusion and stenosis of bilateral carotid arteries: Secondary | ICD-10-CM | POA: Diagnosis not present

## 2016-07-29 DIAGNOSIS — I700 Atherosclerosis of aorta: Secondary | ICD-10-CM | POA: Insufficient documentation

## 2016-07-29 DIAGNOSIS — N289 Disorder of kidney and ureter, unspecified: Secondary | ICD-10-CM

## 2016-07-29 DIAGNOSIS — R2689 Other abnormalities of gait and mobility: Secondary | ICD-10-CM | POA: Diagnosis not present

## 2016-07-29 DIAGNOSIS — M6281 Muscle weakness (generalized): Secondary | ICD-10-CM | POA: Diagnosis not present

## 2016-07-29 DIAGNOSIS — I359 Nonrheumatic aortic valve disorder, unspecified: Secondary | ICD-10-CM

## 2016-07-29 DIAGNOSIS — R293 Abnormal posture: Secondary | ICD-10-CM | POA: Diagnosis not present

## 2016-07-29 DIAGNOSIS — I878 Other specified disorders of veins: Secondary | ICD-10-CM | POA: Insufficient documentation

## 2016-07-29 DIAGNOSIS — Z452 Encounter for adjustment and management of vascular access device: Secondary | ICD-10-CM | POA: Diagnosis not present

## 2016-07-29 HISTORY — PX: IR US GUIDE VASC ACCESS RIGHT: IMG2390

## 2016-07-29 HISTORY — PX: IR RADIOLOGY PERIPHERAL GUIDED IV START: IMG5598

## 2016-07-29 LAB — PULMONARY FUNCTION TEST
DL/VA % PRED: 69 %
DL/VA: 3.07 ml/min/mmHg/L
DLCO unc % pred: 55 %
DLCO unc: 11.13 ml/min/mmHg
FEF 25-75 POST: 1.47 L/s
FEF 25-75 Pre: 1.09 L/sec
FEF2575-%CHANGE-POST: 35 %
FEF2575-%PRED-POST: 387 %
FEF2575-%Pred-Pre: 286 %
FEV1-%CHANGE-POST: 7 %
FEV1-%PRED-PRE: 132 %
FEV1-%Pred-Post: 141 %
FEV1-POST: 1.52 L
FEV1-Pre: 1.42 L
FEV1FVC-%CHANGE-POST: 1 %
FEV1FVC-%Pred-Pre: 106 %
FEV6-%Change-Post: 5 %
FEV6-%Pred-Post: 148 %
FEV6-%Pred-Pre: 141 %
FEV6-PRE: 1.91 L
FEV6-Post: 2 L
FEV6FVC-%Change-Post: 0 %
FEV6FVC-%PRED-PRE: 109 %
FEV6FVC-%Pred-Post: 109 %
FVC-%CHANGE-POST: 5 %
FVC-%PRED-POST: 134 %
FVC-%PRED-PRE: 127 %
FVC-PRE: 1.91 L
FVC-Post: 2.01 L
POST FEV1/FVC RATIO: 76 %
PRE FEV6/FVC RATIO: 100 %
Post FEV6/FVC ratio: 100 %
Pre FEV1/FVC ratio: 74 %
RV % PRED: 100 %
RV: 2.56 L
TLC % pred: 99 %
TLC: 4.56 L

## 2016-07-29 LAB — VAS US CAROTID
LCCAPSYS: -56 cm/s
LEFT ECA DIAS: -1 cm/s
LEFT VERTEBRAL DIAS: 11 cm/s
LICADDIAS: -29 cm/s
LICAPDIAS: -46 cm/s
LICAPSYS: -140 cm/s
Left CCA dist dias: 18 cm/s
Left CCA dist sys: 48 cm/s
Left CCA prox dias: -16 cm/s
Left ICA dist sys: -84 cm/s
RIGHT ECA DIAS: -7 cm/s
RIGHT VERTEBRAL DIAS: 15 cm/s
Right CCA prox dias: 16 cm/s
Right CCA prox sys: 42 cm/s
Right cca dist sys: -65 cm/s

## 2016-07-29 MED ORDER — SODIUM BICARBONATE 8.4 % IV SOLN: INTRAVENOUS | Status: DC

## 2016-07-29 MED ORDER — SODIUM BICARBONATE 8.4 % IV SOLN
INTRAVENOUS | Status: AC
  Administered 2016-07-29: 16:00:00 via INTRAVENOUS
  Filled 2016-07-29 (×2): qty 1000

## 2016-07-29 MED ORDER — IOPAMIDOL (ISOVUE-370) INJECTION 76%
80.0000 mL | Freq: Once | INTRAVENOUS | Status: AC | PRN
  Administered 2016-07-29: 100 mL via INTRAVENOUS

## 2016-07-29 MED ORDER — LIDOCAINE HCL 1 % IJ SOLN
INTRAMUSCULAR | Status: AC
  Filled 2016-07-29: qty 20

## 2016-07-29 MED ORDER — ALBUTEROL SULFATE (2.5 MG/3ML) 0.083% IN NEBU
2.5000 mg | INHALATION_SOLUTION | Freq: Once | RESPIRATORY_TRACT | Status: AC
  Administered 2016-07-29: 2.5 mg via RESPIRATORY_TRACT

## 2016-07-29 MED ORDER — SODIUM BICARBONATE BOLUS VIA INFUSION
INTRAVENOUS | Status: AC
  Administered 2016-07-29: 150 meq via INTRAVENOUS

## 2016-07-29 MED ORDER — SODIUM BICARBONATE BOLUS VIA INFUSION
INTRAVENOUS | Status: DC
  Filled 2016-07-29: qty 1

## 2016-07-29 NOTE — Progress Notes (Signed)
VASCULAR LAB PRELIMINARY  PRELIMINARY  PRELIMINARY  PRELIMINARY  Carotid duplex completed.    Preliminary report:  1-39% right ICA stenosis.  40-59% left ICA stenosis.  Vertebral artery flow is antegrade.   Selden Noteboom, RVT 07/29/2016, 10:20 AM

## 2016-07-29 NOTE — Progress Notes (Signed)
Bicarb drip stopped

## 2016-07-29 NOTE — Therapy (Signed)
[place], [place], [address] Phone: [phone]   Fax:  [phone]  Physical Therapy Evaluation  Patient Details  Name: Dawn Cooper MRN: [id number] Date of Birth: 11-22-1918 Referring Provider: Sherren Mocha  Encounter Date: 07/29/2016      PT End of Session - 07/29/16 0843    Visit Number 1   PT Start Time 0840   PT Stop Time 0918   PT Time Calculation (min) 38 min   Equipment Utilized During Treatment Gait belt      Past Medical History:  Diagnosis Date  . A-fib ([place])   . Broken leg    right  . Cardiomyopathy   . Chronic kidney disease   . GERD (gastroesophageal reflux disease)   . Heart murmur   . Hiatal hernia   . Hypertension   . Mild mitral regurgitation by prior echocardiogram   . Moderate aortic stenosis   . Stroke ([place])   . Tricuspid regurgitation     Past Surgical History:  Procedure Laterality Date  . APPENDECTOMY    . CATARACT EXTRACTION  2013   left  . CHOLECYSTECTOMY    . FEMUR FRACTURE SURGERY  12/2011   Dr. Sabra Heck, s/p rehab at [facility]  . GALLBLADDER SURGERY    . VAGINAL DELIVERY      There were no vitals filed for this visit.       Subjective Assessment - 07/29/16 0846    Subjective Pt reports a recent onset of shortness of breath and fatigue over the past month or two. Symptoms are limiting her ability to care for her pet rabbit and she is slower with her ADLs. She is also limited in her cooking.    Patient Stated Goals to fix heart   Currently in Pain? No/denies            [facility] PT Assessment - 07/29/16 0001      Assessment   Medical Diagnosis severe aortic stenosis   Referring Provider Sherren Mocha   Onset Date/Surgical Date 05/27/16     Precautions   Precautions Fall     Restrictions   Weight Bearing Restrictions No     Balance Screen   Has the patient fallen in the past 6 months No   Has the patient had a decrease in activity level  because of a fear of falling?  No   Is the patient reluctant to leave their home because of a fear of falling?  No     Home Environment   Living Environment Private residence   Living Arrangements Children   Type of [place] to enter   Entrance Stairs-Number of Steps 1   Entrance Stairs-Rails None   Home Layout Two level;Able to live on main level with bedroom/bathroom     Prior Function   Level of Independence Independent with household mobility without device;Independent with community mobility with device  has someone with her when leaving house     Posture/Postural Control   Posture/Postural Control Postural limitations   Postural Limitations Forward head;Rounded Shoulders     ROM / Strength   AROM / PROM / Strength AROM;Strength     AROM   Overall AROM Comments grossly WNL     Strength   Overall Strength Comments grossly 4/5 throughout   Strength Assessment Site Hand   Right/Left hand Right;Left   Right Hand Grip (lbs) 38  R hand dominant   Left Hand Grip (lbs)  40     Ambulation/Gait   Assistive device Straight cane   Gait Pattern Poor foot clearance - left;Poor foot clearance - right;Decreased step length - right;Decreased step length - left  R knee valgus          [facility] Pre-Surgical Assessment - Aug 19, 2016 0001    5 Meter Walk Test- trial 1 10 sec   5 Meter Walk Test- trial 2 11 sec.    5 Meter Walk Test- trial 3 9 sec.   5 meter walk test average 10 sec   4 Stage Balance Test tolerated for:  10 sec.   4 Stage Balance Test Position 2   Comment posterior LOB with 1 rep without UE support   ADL/IADL Independent with: Bathing;Dressing;Meal prep;Finances   ADL/IADL Needs Assistance with: [employer] work   ADL/IADL Fraility Index Vulnerable   6 Minute Walk- Baseline yes   BP (mmHg) (!)  145/92   HR (bpm) 81   02 Sat (%RA) 95 %   Modified Borg Scale for Dyspnea 0- Nothing at all   Perceived Rate of Exertion (Borg) 6-   6 Minute Walk Post Test  yes   BP (mmHg) 152/83   HR (bpm) 94   02 Sat (%RA) 97 %   Modified Borg Scale for Dyspnea 1- Very mild shortness of breath   Aerobic Endurance Distance Walked 400                                     Plan - 2016-08-19 0926    Clinical Impression Statement see below   PT Frequency One time visit   Consulted and Agree with Plan of Care Family member/caregiver;Patient     Clinical Impression Statement: Pt is a 83 yo female presenting to OP PT for evaluation prior to possible TAVR surgery due to severe aortic stenosis. Pt reports onset of shortness of breath and fatigue over the past 1- 2 months. Symptoms are limiting her ability to care for her pet rabbit, slowing her down on her ADLs and limiting her from cooking as much. Pt presents with good ROM and strength overall with just mild weakness throughout, poor balance and is at high fall risk 4 stage balance test, slow walking speed and poor aerobic endurance per 6 minute walk test. Pt ambulated 185 feet in 2:30 before requesting a seated rest beak lasting 1 minute. Pt reported rest was sue to leg fatigue.  Pt able to resume after rest and ambulate an additional 215 feet. Pt ambulated a total of 400 feet in 6 minute walk. Based on the Short Physical Performance Battery, patient has a frailty rating of 3/12 with </= 5/12 considered frail.     Patient demonstrated the following deficits and impairments:     Visit Diagnosis: Other abnormalities of gait and mobility  Muscle weakness (generalized)  Abnormal posture      G-Codes - Aug 19, 2016 0925    Functional Assessment Tool Used (Outpatient Only) 6 minute walk 400'   Functional Limitation Mobility: Walking and moving around   Mobility: Walking and Moving Around Current Status [phone]) At least 60 percent but less than 80 percent impaired, limited or restricted   Mobility: Walking and Moving Around Goal Status [phone]) At least 60 percent but less than 80 percent  impaired, limited or restricted   Mobility: Walking and Moving Around Discharge Status [phone]) At least 60 percent but less than 80 percent impaired,  limited or restricted       Problem List Patient Active Problem List   Diagnosis Date Noted  . Sleeping difficulty 07/01/2016  . B12 deficiency 04/01/2016  . Chronic fatigue 09/13/2014  . History of stroke 09/09/2014  . CKD (chronic kidney disease), stage III 06/16/2014  . CVA (cerebral vascular accident) ([place]) 06/02/2014  . Aortic valve stenosis, severe 05/05/2014  . Lightheadedness 03/22/2014  . Anemia 12/17/2013  . Gait instability 10/23/2011  . GERD (gastroesophageal reflux disease) 07/18/2011  . Pernicious anemia 03/13/2011  . Atrial fibrillation ([employer]) 01/14/2011  . Edema 01/14/2011  . HTN (hypertension) 01/14/2011    Grady, PT 07/29/2016, 9:26 AM  [facility] [address] [place], [place], [address] Phone: [phone]   Fax:  [phone]  Name: Itzel Mckibbin MRN: [id number] Date of Birth: 06-09-1918

## 2016-07-29 NOTE — Procedures (Signed)
Successful placement of a micropuncture sheath for temporary venous access via the right basilic vein.   The IV is ready for immediate use. EBL: None Complications: None Immediate.  Ronny Bacon, MD Pager #: [phone]

## 2016-08-05 ENCOUNTER — Telehealth: Payer: Self-pay | Admitting: Cardiovascular Disease

## 2016-08-05 NOTE — Telephone Encounter (Signed)
I spoke with the pt's daughter Katharine Look and reviewed pre cardiac catheterization instructions for 08/07/16.

## 2016-08-05 NOTE — Telephone Encounter (Signed)
New Message   Pt daughter has questions about her mothers procedure on Wednesday and requests a call back if possible.

## 2016-08-07 ENCOUNTER — Ambulatory Visit (HOSPITAL_BASED_OUTPATIENT_CLINIC_OR_DEPARTMENT_OTHER)
Admission: RE | Admit: 2016-08-07 | Discharge: 2016-08-07 | Disposition: A | Discharge status: Home / Self Care | Payer: Medicare Other | Source: Ambulatory Visit | Attending: Cardiovascular Disease | Admitting: Cardiovascular Disease

## 2016-08-07 ENCOUNTER — Encounter (HOSPITAL_COMMUNITY)
Admission: RE | Disposition: A | Discharge status: Home / Self Care | Payer: Self-pay | Source: Ambulatory Visit | Attending: Cardiovascular Disease

## 2016-08-07 DIAGNOSIS — R54 Age-related physical debility: Secondary | ICD-10-CM | POA: Diagnosis not present

## 2016-08-07 DIAGNOSIS — I429 Cardiomyopathy, unspecified: Secondary | ICD-10-CM

## 2016-08-07 DIAGNOSIS — I350 Nonrheumatic aortic (valve) stenosis: Secondary | ICD-10-CM

## 2016-08-07 DIAGNOSIS — I447 Left bundle-branch block, unspecified: Secondary | ICD-10-CM | POA: Diagnosis not present

## 2016-08-07 DIAGNOSIS — I130 Hypertensive heart and chronic kidney disease with heart failure and stage 1 through stage 4 chronic kidney disease, or unspecified chronic kidney disease: Secondary | ICD-10-CM

## 2016-08-07 DIAGNOSIS — Z7982 Long term (current) use of aspirin: Secondary | ICD-10-CM

## 2016-08-07 DIAGNOSIS — I5021 Acute systolic (congestive) heart failure: Secondary | ICD-10-CM | POA: Diagnosis not present

## 2016-08-07 DIAGNOSIS — Z8673 Personal history of transient ischemic attack (TIA), and cerebral infarction without residual deficits: Secondary | ICD-10-CM | POA: Insufficient documentation

## 2016-08-07 DIAGNOSIS — I482 Chronic atrial fibrillation: Secondary | ICD-10-CM

## 2016-08-07 DIAGNOSIS — I2119 ST elevation (STEMI) myocardial infarction involving other coronary artery of inferior wall: Secondary | ICD-10-CM | POA: Diagnosis not present

## 2016-08-07 DIAGNOSIS — R079 Chest pain, unspecified: Secondary | ICD-10-CM | POA: Diagnosis not present

## 2016-08-07 DIAGNOSIS — I083 Combined rheumatic disorders of mitral, aortic and tricuspid valves: Secondary | ICD-10-CM

## 2016-08-07 DIAGNOSIS — N183 Chronic kidney disease, stage 3 (moderate): Secondary | ICD-10-CM | POA: Diagnosis not present

## 2016-08-07 DIAGNOSIS — I255 Ischemic cardiomyopathy: Secondary | ICD-10-CM | POA: Diagnosis not present

## 2016-08-07 DIAGNOSIS — Z880 Allergy status to penicillin: Secondary | ICD-10-CM | POA: Insufficient documentation

## 2016-08-07 DIAGNOSIS — N189 Chronic kidney disease, unspecified: Secondary | ICD-10-CM | POA: Insufficient documentation

## 2016-08-07 DIAGNOSIS — K219 Gastro-esophageal reflux disease without esophagitis: Secondary | ICD-10-CM

## 2016-08-07 DIAGNOSIS — I2510 Atherosclerotic heart disease of native coronary artery without angina pectoris: Secondary | ICD-10-CM

## 2016-08-07 DIAGNOSIS — I420 Dilated cardiomyopathy: Secondary | ICD-10-CM | POA: Diagnosis not present

## 2016-08-07 DIAGNOSIS — I2102 ST elevation (STEMI) myocardial infarction involving left anterior descending coronary artery: Secondary | ICD-10-CM | POA: Diagnosis not present

## 2016-08-07 DIAGNOSIS — I5032 Chronic diastolic (congestive) heart failure: Secondary | ICD-10-CM | POA: Insufficient documentation

## 2016-08-07 DIAGNOSIS — Z7902 Long term (current) use of antithrombotics/antiplatelets: Secondary | ICD-10-CM | POA: Insufficient documentation

## 2016-08-07 DIAGNOSIS — I454 Nonspecific intraventricular block: Secondary | ICD-10-CM | POA: Diagnosis not present

## 2016-08-07 LAB — BASIC METABOLIC PANEL
ANION GAP: 6 (ref 5–15)
BUN: 16 mg/dL (ref 6–20)
CALCIUM: 9.2 mg/dL (ref 8.9–10.3)
CO2: 24 mmol/L (ref 22–32)
Chloride: 109 mmol/L (ref 101–111)
Creatinine, Ser: 1.8 mg/dL — ABNORMAL HIGH (ref 0.44–1.00)
GFR calc Af Amer: 26 mL/min — ABNORMAL LOW (ref >60)
GFR calc non Af Amer: 23 mL/min — ABNORMAL LOW (ref >60)
Glucose, Bld: 115 mg/dL — ABNORMAL HIGH (ref 65–99)
Potassium: 4.5 mmol/L (ref 3.5–5.1)
Sodium: 139 mmol/L (ref 135–145)

## 2016-08-07 LAB — CBC
HEMATOCRIT: 35.7 % — AB (ref 36.0–46.0)
Hemoglobin: 12.2 g/dL (ref 12.0–15.0)
MCH: 31.1 pg (ref 26.0–34.0)
MCHC: 34.2 g/dL (ref 30.0–36.0)
MCV: 91.1 fL (ref 78.0–100.0)
PLATELETS: 185 10*3/uL (ref 150–400)
RBC: 3.92 MIL/uL (ref 3.87–5.11)
RDW: 13.2 % (ref 11.5–15.5)
WBC: 6.1 10*3/uL (ref 4.0–10.5)

## 2016-08-07 LAB — PROTIME-INR
INR: 1.12
Prothrombin Time: 14.5 seconds (ref 11.4–15.2)

## 2016-08-07 SURGERY — RIGHT HEART CATH AND CORONARY ANGIOGRAPHY
Anesthesia: LOCAL

## 2016-08-07 MED ORDER — ASPIRIN 81 MG PO CHEW: 81.0000 mg | CHEWABLE_TABLET | ORAL | Status: DC

## 2016-08-07 MED ORDER — IOPAMIDOL (ISOVUE-370) INJECTION 76%
INTRAVENOUS | Status: AC
  Filled 2016-08-07: qty 100

## 2016-08-07 MED ORDER — MIDAZOLAM HCL 2 MG/2ML IJ SOLN
INTRAMUSCULAR | Status: AC
  Filled 2016-08-07: qty 2

## 2016-08-07 MED ORDER — LIDOCAINE HCL (PF) 1 % IJ SOLN
INTRAMUSCULAR | Status: DC | PRN
  Administered 2016-08-07 (×2): 2 mL

## 2016-08-07 MED ORDER — SODIUM CHLORIDE 0.9 % IV SOLN: 250.0000 mL | INTRAVENOUS | Status: DC | PRN

## 2016-08-07 MED ORDER — HEPARIN SODIUM (PORCINE) 1000 UNIT/ML IJ SOLN
INTRAMUSCULAR | Status: DC | PRN
  Administered 2016-08-07: 3000 [IU] via INTRAVENOUS

## 2016-08-07 MED ORDER — SODIUM CHLORIDE 0.9% FLUSH: 3.0000 mL | INTRAVENOUS | Status: DC | PRN

## 2016-08-07 MED ORDER — IOPAMIDOL (ISOVUE-370) INJECTION 76%
INTRAVENOUS | Status: DC | PRN
  Administered 2016-08-07: 25 mL via INTRA_ARTERIAL

## 2016-08-07 MED ORDER — VERAPAMIL HCL 2.5 MG/ML IV SOLN
INTRAVENOUS | Status: DC | PRN
  Administered 2016-08-07: 11:00:00 via INTRA_ARTERIAL

## 2016-08-07 MED ORDER — SODIUM CHLORIDE 0.9 % WEIGHT BASED INFUSION
3.0000 mL/kg/h | INTRAVENOUS | Status: AC
  Administered 2016-08-07: 3 mL/kg/h via INTRAVENOUS

## 2016-08-07 MED ORDER — HEPARIN (PORCINE) IN NACL 2-0.9 UNIT/ML-% IJ SOLN
INTRAMUSCULAR | Status: AC | PRN
  Administered 2016-08-07: 1000 mL

## 2016-08-07 MED ORDER — SODIUM CHLORIDE 0.9% FLUSH: 3.0000 mL | Freq: Two times a day (BID) | INTRAVENOUS | Status: DC

## 2016-08-07 MED ORDER — VERAPAMIL HCL 2.5 MG/ML IV SOLN
INTRAVENOUS | Status: AC
  Filled 2016-08-07: qty 2

## 2016-08-07 MED ORDER — SODIUM CHLORIDE 0.9 % WEIGHT BASED INFUSION: 1.0000 mL/kg/h | INTRAVENOUS | Status: DC

## 2016-08-07 MED ORDER — HEPARIN SODIUM (PORCINE) 1000 UNIT/ML IJ SOLN
INTRAMUSCULAR | Status: AC
  Filled 2016-08-07: qty 1

## 2016-08-07 MED ORDER — MIDAZOLAM HCL 2 MG/2ML IJ SOLN
INTRAMUSCULAR | Status: DC | PRN
  Administered 2016-08-07: 1 mg via INTRAVENOUS

## 2016-08-07 MED ORDER — LIDOCAINE HCL 1 % IJ SOLN
INTRAMUSCULAR | Status: AC
  Filled 2016-08-07: qty 20

## 2016-08-07 MED ORDER — SODIUM CHLORIDE 0.9 % IV SOLN: INTRAVENOUS | Status: DC

## 2016-08-07 MED ORDER — HEPARIN (PORCINE) IN NACL 2-0.9 UNIT/ML-% IJ SOLN
INTRAMUSCULAR | Status: AC
  Filled 2016-08-07: qty 1000

## 2016-08-07 SURGICAL SUPPLY — 14 items
CATH 5FR JL3.5 JR4 ANG PIG MP (CATHETERS) ×2 IMPLANT
CATH BALLN WEDGE 5F 110CM (CATHETERS) ×2 IMPLANT
CATH LAUNCHER 5F EBU3.5 (CATHETERS) ×2 IMPLANT
DEVICE RAD COMP TR BAND LRG (VASCULAR PRODUCTS) ×2 IMPLANT
GLIDESHEATH SLEND SS 6F .021 (SHEATH) ×2 IMPLANT
GUIDEWIRE .025 260CM (WIRE) ×2 IMPLANT
GUIDEWIRE INQWIRE 1.5J.035X260 (WIRE) ×1 IMPLANT
INQWIRE 1.5J .035X260CM (WIRE) ×2
KIT HEART LEFT (KITS) ×2 IMPLANT
PACK CARDIAC CATHETERIZATION (CUSTOM PROCEDURE TRAY) ×2 IMPLANT
SHEATH GLIDE SLENDER 4/5FR (SHEATH) ×2 IMPLANT
TRANSDUCER W/STOPCOCK (MISCELLANEOUS) ×2 IMPLANT
TUBING CIL FLEX 10 FLL-RA (TUBING) ×2 IMPLANT
WIRE HI TORQ VERSACORE-J 145CM (WIRE) ×2 IMPLANT

## 2016-08-07 NOTE — Discharge Instructions (Signed)
**STOP TAKING LISINOPRIL**  Radial Site Care Refer to this sheet in the next few weeks. These instructions provide you with information about caring for yourself after your procedure. Your health care provider may also give you more specific instructions. Your treatment has been planned according to current medical practices, but problems sometimes occur. Call your health care provider if you have any problems or questions after your procedure. What can I expect after the procedure? After your procedure, it is typical to have the following:  Bruising at the radial site that usually fades within 1-2 weeks.  Blood collecting in the tissue (hematoma) that may be painful to the touch. It should usually decrease in size and tenderness within 1-2 weeks. Follow these instructions at home:  Take medicines only as directed by your health care provider.  You may shower 24-48 hours after the procedure or as directed by your health care provider. Remove the bandage (dressing) and gently wash the site with plain soap and water. Pat the area dry with a clean towel. Do not rub the site, because this may cause bleeding.  Do not take baths, swim, or use a hot tub until your health care provider approves.  Check your insertion site every day for redness, swelling, or drainage.  Do not apply powder or lotion to the site.  Do not flex or bend the affected arm for 24 hours or as directed by your health care provider.  Do not push or pull heavy objects with the affected arm for 24 hours or as directed by your health care provider.  Do not lift over 10 lb (4.5 kg) for 5 days after your procedure or as directed by your health care provider.  Ask your health care provider when it is okay to:  Return to work or school.  Resume usual physical activities or sports.  Resume sexual activity.  Do not drive home if you are discharged the same day as the procedure. Have someone else drive you.  You may drive 24  hours after the procedure unless otherwise instructed by your health care provider.  Do not operate machinery or power tools for 24 hours after the procedure.  If your procedure was done as an outpatient procedure, which means that you went home the same day as your procedure, a responsible adult should be with you for the first 24 hours after you arrive home.  Keep all follow-up visits as directed by your health care provider. This is important. Contact a health care provider if:  You have a fever.  You have chills.  You have increased bleeding from the radial site. Hold pressure on the site. Get help right away if:  You have unusual pain at the radial site.  You have redness, warmth, or swelling at the radial site.  You have drainage (other than a small amount of blood on the dressing) from the radial site.  The radial site is bleeding, and the bleeding does not stop after 30 minutes of holding steady pressure on the site.  Your arm or hand becomes pale, cool, tingly, or numb. This information is not intended to replace advice given to you by your health care provider. Make sure you discuss any questions you have with your health care provider. Document Released: 04/13/2010 Document Revised: 08/17/2015 Document Reviewed: 09/27/2013 Elsevier Interactive Patient Education  2017 [employer].    Brachial site, Care After This sheet gives you information about how to care for yourself after your procedure. Your doctor  may also give you more specific instructions. If you have problems or questions, contact your doctor. Follow these instructions at home: Insertion site care   Follow instructions from your doctor about how to take care of your long, thin tube (catheter) insertion area. Make sure you:  Wash your hands with soap and water before you change your bandage (dressing). If you cannot use soap and water, use hand sanitizer.  Change your bandage as told by your doctor  Do  not take baths, swim, or use a hot tub until your doctor says it is okay.  You may shower 24 hours after the procedure or as told by your doctor.  Gently wash the area with plain soap and water.  Pat the area dry with a clean towel.  Do not rub the area. This may cause bleeding.  Do not apply powder or lotion to the area. Keep the area clean and dry.  Check your insertion area every day for signs of infection. Check for:  More redness, swelling, or pain.  Fluid or blood.  Warmth.  Pus or a bad smell. Activity   Rest as told by your doctor, usually for 1-2 days.  Do not lift anything that is heavier than 10 lbs. (4.5 kg) or as told by your doctor.  Do not drive for 24 hours if you were given a medicine to help you relax (sedative).  Do not drive or use heavy machinery while taking prescription pain medicine. General instructions   Go back to your normal activities as told by your doctor, usually in about a week. Ask your doctor what activities are safe for you.  If the insertion area starts to bleed, lie flat and put pressure on the area. If the bleeding does not stop, get help right away. This is an emergency.  Drink enough fluid to keep your pee (urine) clear or pale yellow.  Take over-the-counter and prescription medicines only as told by your doctor.  Keep all follow-up visits as told by your doctor. This is important. Contact a doctor if:  You have a fever.  You have chills.  You have more redness, swelling, or pain around your insertion area.  You have fluid or blood coming from your insertion area.  The insertion area feels warm to the touch.  You have pus or a bad smell coming from your insertion area.  You have more bruising around the insertion area.  Blood collects in the tissue around the insertion area (hematoma) that may be painful to the touch. Get help right away if:  You have a lot of pain in the insertion area.  The insertion area swells  very fast.  The insertion area is bleeding, and the bleeding does not stop after holding steady pressure on the area.  The area near or just beyond the insertion area becomes pale, cool, tingly, or numb. These symptoms may be an emergency. Do not wait to see if the symptoms will go away. Get medical help right away. Call your local emergency services (911 in the [place] Do not drive yourself to the hospital. Summary  After the procedure, it is common to have bruising and tenderness at the long, thin tube insertion area.  After the procedure, it is important to rest and drink plenty of fluids.  Do not take baths, swim, or use a hot tub until your doctor says it is okay to do so. You may shower 24-48 hours after the procedure or as told by your  doctor.  If the insertion area starts to bleed, lie flat and put pressure on the area. If the bleeding does not stop, get help right away. This is an emergency. This information is not intended to replace advice given to you by your health care provider. Make sure you discuss any questions you have with your health care provider. Document Released: 06/07/2008 Document Revised: 03/05/2016 Document Reviewed: 03/05/2016 Elsevier Interactive Patient Education  2017 [employer].

## 2016-08-07 NOTE — Final Progress Note (Signed)
MD aware of slow ooze.  Pt has not bleed in 45 minutes.  Pt has had discharge instructions.

## 2016-08-07 NOTE — Interval H&P Note (Signed)
History and Physical Interval Note:  08/07/2016 11:02 AM  Dawn Cooper  has presented today for surgery, with the diagnosis of aortic stenosis  The various methods of treatment have been discussed with the patient and family. After consideration of risks, benefits and other options for treatment, the patient has consented to  Procedure(s): Right/Left Heart Cath and Coronary Angiography (N/A) as a surgical intervention .  The patient's history has been reviewed, patient examined, no change in status, stable for surgery.  I have reviewed the patient's chart and labs.  Questions were answered to the patient's satisfaction.     Sherren Mocha

## 2016-08-07 NOTE — Final Progress Note (Signed)
Pt had a slow bleed when the TR band was removed.  Pressure was held for 5 minutes a new dressing was applied.  Will continue to monitor

## 2016-08-07 NOTE — H&P (View-Only) (Signed)
Cardiology Office Note Date:  07/12/2016   ID:  Dawn Cooper, DOB [birth year], MRN [id number]  PCP:  Tommi Rumps, MD  Cardiologist:  Dr Rockey Situ   Chief Complaint  Patient presents with  . TAVR consult     History of Present Illness: Dawn Cooper is a 83 y.o. female who presents for evaluation of severe aortic stenosis. She is referred by Dr Rockey Situ for consideration of TAVR.   The patient worked at [employer] for over 30 years. She's been retired for many years. She lives with her daughter in [place]. The patient has been followed for aortic stenosis and previously has been asymptomatic, but over the last several months she has developed progressive symptoms and her echocardiogram now shows progression of aortic stenosis and at the severe/critical range.  The patient is not active. States that she has become "lazy." She attributes this to fatigue and shortness of breath. She denies orthopnea, PND, chest pain, or heart palpitations. She feels dizzy all the time but does not really have dizziness brought on by physical exertion. She is also followed for permanent atrial fibrillation. She is not able to take oral anticoagulation because of GI bleeding related to AVMs. The patient has a history of stroke in June 2016 documented on brain MRI. She does not have any major sequelae of her stroke.  Past Medical History:  Diagnosis Date  . A-fib ([place])   . Broken leg    right  . Cardiomyopathy   . Chronic kidney disease   . GERD (gastroesophageal reflux disease)   . Heart murmur   . Hiatal hernia   . Hypertension   . Mild mitral regurgitation by prior echocardiogram   . Moderate aortic stenosis   . Stroke ([place])   . Tricuspid regurgitation     Past Surgical History:  Procedure Laterality Date  . APPENDECTOMY    . CATARACT EXTRACTION  2013   left  . CHOLECYSTECTOMY    . FEMUR FRACTURE SURGERY  12/2011   Dr. Sabra Heck, s/p rehab at [facility]  . GALLBLADDER SURGERY      . VAGINAL DELIVERY      Current Outpatient Prescriptions  Medication Sig Dispense Refill  . aspirin EC 81 MG tablet Take 1 tablet (81 mg total) by mouth daily. 30 tablet 0  . clopidogrel (PLAVIX) 75 MG tablet Take 75 mg by mouth every other day.    . furosemide (LASIX) 20 MG tablet Take 1 tablet (20 mg total) by mouth every other day. 45 tablet 3  . isosorbide mononitrate (IMDUR) 30 MG 24 hr tablet TAKE 1 TABLET EVERY DAY 90 tablet 3  . lisinopril (PRINIVIL,ZESTRIL) 5 MG tablet Take 1 tablet (5 mg total) by mouth daily. 90 tablet 3  . metoprolol tartrate (LOPRESSOR) 25 MG tablet Take 12.5 mg by mouth 2 (two) times daily.    . pantoprazole (PROTONIX) 40 MG tablet TAKE 1 TABLET EVERY DAY 90 tablet 3  . potassium chloride (K-DUR,KLOR-CON) 10 MEQ tablet Take 1 tablet (10 mEq total) by mouth every other day. 135 tablet 3   No current facility-administered medications for this visit.     Allergies:   Pradaxa [dabigatran etexilate mesylate]; Clonidine derivatives; and Penicillins   Social History:  The patient  reports that she has never smoked. She has never used smokeless tobacco. She reports that she does not drink alcohol or use drugs.   Family History:  The patient's  family history includes Cancer in her brother and sister; Diabetes in her  brother; Stroke in her sister.   ROS:  Please see the history of present illness.  Otherwise, review of systems is positive for memory loss.  All other systems are reviewed and negative.   PHYSICAL EXAM: VS:  BP 130/82   Pulse 78   Ht 4\' 11"  (1.499 m)   Wt 141 lb 9.6 oz (64.2 kg)   SpO2 99%   BMI 28.60 kg/m  , BMI Body mass index is 28.6 kg/m. GEN: Well nourished, well developed, pleasant elderly woman in no acute distress  HEENT: normal  Neck: no JVD, no masses. bilateral carotid bruits with delayed upstrokes Cardiac: irregularly irregular with 3/6 harsh late peaking systolic murmur at the apex, A2 is absent               Respiratory:  clear  to auscultation bilaterally, normal work of breathing GI: soft, nontender, nondistended, + BS MS: no deformity or atrophy  Ext: no pretibial edema, pedal pulses 2+= bilaterally Skin: warm and dry, no rash Neuro:  Strength and sensation are intact Psych: euthymic mood, full affect  EKG:  EKG is not ordered today.  Recent Labs: 10/20/2015: Hemoglobin 11.9; Platelets 189.0 07/01/2016: ALT 12; BUN 24; Creatinine, Ser 1.41; Potassium 4.1; Sodium 140   Lipid Panel     Component Value Date/Time   CHOL 168 09/10/2014 0458   TRIG 90 09/10/2014 0458   HDL 30 (L) 09/10/2014 0458   CHOLHDL 5.6 09/10/2014 0458   VLDL 18 09/10/2014 0458   LDLCALC 120 (H) 09/10/2014 0458      Wt Readings from Last 3 Encounters:  07/12/16 141 lb 9.6 oz (64.2 kg)  07/01/16 142 lb 3.2 oz (64.5 kg)  05/27/16 138 lb 12 oz (62.9 kg)     Cardiac Studies Reviewed: 2D echo: Left ventricle:  The cavity size was normal. There was moderate concentric hypertrophy. Systolic function was normal. The estimated ejection fraction was in the range of 55% to 60%. Wall motion was normal; there were no regional wall motion abnormalities. The study was not technically sufficient to allow evaluation of LV diastolic dysfunction due to atrial fibrillation.  ------------------------------------------------------------------- Aortic valve:   Trileaflet; moderately thickened, severely calcified leaflets. Mobility was not restricted.  Doppler:   There was critical stenosis.   There was mild regurgitation.    VTI ratio of LVOT to aortic valve: 0.14. Valve area (VTI): 0.31 cm^2. Indexed valve area (VTI): 0.18 cm^2/m^2. Mean velocity ratio of LVOT to aortic valve: 0.13. Valve area (Vmean): 0.3 cm^2. Indexed valve area (Vmean): 0.18 cm^2/m^2.    Mean gradient (S): 41 mm Hg. Peak gradient (S): 72 mm Hg.  ------------------------------------------------------------------- Aorta:  Aortic root: The aortic root was normal in  size.  ------------------------------------------------------------------- Mitral valve:   Calcified annulus. Moderately thickened, mildly calcified leaflets . Mobility was not restricted.  Doppler: Transvalvular velocity was within the normal range. There was no evidence for stenosis. There was moderate regurgitation. Planimetered valve area: 2.02 cm^2. Indexed valve area by planimetry: 1.22 cm^2/m^2. Valve area by pressure half-time: 3.93 cm^2. Indexed valve area by pressure half-time: 2.36 cm^2/m^2. Peak gradient (D): 7 mm Hg.  ------------------------------------------------------------------- Left atrium:  The atrium was moderately dilated.  ------------------------------------------------------------------- Right ventricle:  The cavity size was normal. Wall thickness was normal. Systolic function was normal.  ------------------------------------------------------------------- Pulmonic valve:    Doppler:  Transvalvular velocity was within the normal range. There was no evidence for stenosis.  ------------------------------------------------------------------- Tricuspid valve:   Structurally normal valve.    Doppler: Transvalvular velocity  was within the normal range. There was mild regurgitation.  ------------------------------------------------------------------- Pulmonary artery:   The main pulmonary artery was normal-sized. Systolic pressure was moderately increased.  ------------------------------------------------------------------- Right atrium:  The atrium was mildly dilated.  ------------------------------------------------------------------- Pericardium:  There was no pericardial effusion.  ------------------------------------------------------------------- Systemic veins: Inferior vena cava: The vessel was mildly dilated. The respirophasic diameter changes were blunted (<  50%).  ------------------------------------------------------------------- Measurements   Left ventricle                           Value          Reference  LV ID, ED, PLAX chordal          (L)     34    mm       43 - 52  LV ID, ES, PLAX chordal                  25    mm       23 - 38  LV fx shortening, PLAX chordal   (L)     26    %        >=29  LV PW thickness, ED                      13    mm       ----------  IVS/LV PW ratio, ED              (H)     1.38           <=1.3  Stroke volume, 2D                        35    ml       ----------  Stroke volume/bsa, 2D                    21    ml/m^2   ----------  LV e&', lateral                           8.51  cm/s     ----------  LV E/e&', lateral                         15.75          ----------  LV e&', medial                            4.46  cm/s     ----------  LV E/e&', medial                          30.04          ----------  LV e&', average                           6.49  cm/s     ----------  LV E/e&', average                         20.66          ----------    Ventricular septum  Value          Reference  IVS thickness, ED                        18    mm       ----------    LVOT                                     Value          Reference  LVOT ID, S                               17    mm       ----------  LVOT area                                2.27  cm^2     ----------  LVOT ID                                  17    mm       ----------  LVOT mean velocity, S                    39.2  cm/s     ----------  LVOT VTI, S                              15.4  cm       ----------  Stroke volume (SV), LVOT DP              35    ml       ----------  Stroke index (SV/bsa), LVOT DP           21    ml/m^2   ----------    Aortic valve                             Value          Reference  Aortic valve peak velocity, S            424   cm/s     ----------  Aortic valve mean velocity, S            299   cm/s      ----------  Aortic valve VTI, S                      114   cm       ----------  Aortic mean gradient, S                  41    mm Hg    ----------  Aortic peak gradient, S                  72    mm Hg    ----------  VTI ratio, LVOT/AV                       0.14           ----------  Aortic valve area, VTI                   0.31  cm^2     ----------  Aortic valve area/bsa, VTI               0.18  cm^2/m^2 ----------  Velocity ratio, mean, LVOT/AV            0.13           ----------  Aortic valve area, mean velocity         0.3   cm^2     ----------  Aortic valve area/bsa, mean              0.18  cm^2/m^2 ----------  velocity    Aorta                                    Value          Reference  Aortic root ID, ED                       26    mm       ----------  Ascending aorta ID, A-P, S               32    mm       ----------    Left atrium                              Value          Reference  LA ID, A-P, ES                           41    mm       ----------  LA ID/bsa, A-P                   (H)     2.47  cm/m^2   <=2.2  LA volume, S                             82.5  ml       ----------  LA volume/bsa, S                         49.6  ml/m^2   ----------  LA volume, ES, 1-p A4C                   82.2  ml       ----------  LA volume/bsa, ES, 1-p A4C               49.5  ml/m^2   ----------  LA volume, ES, 1-p A2C                   77.3  ml       ----------  LA volume/bsa, ES, 1-p A2C               46.5  ml/m^2   ----------    Mitral valve                             Value  Reference  Mitral valve area, planimetry            2.02  cm^2     ----------  Mitral valve area/bsa,                   1.22  cm^2/m^2 ----------  planimetry  Mitral E-wave peak velocity              134   cm/s     ----------  Mitral deceleration time         (L)     141   ms       150 - 230  Mitral pressure half-time                41    ms       ----------  Mitral peak gradient, D                  7     mm  Hg    ----------  Mitral valve area, PHT, DP               3.93  cm^2     ----------  Mitral valve area/bsa, PHT, DP           2.36  cm^2/m^2 ----------    Pulmonary arteries                       Value          Reference  PA pressure, S, DP               (H)     55    mm Hg    <=30    Right atrium                             Value          Reference  RA ID, S-I, ES, A4C              (H)     51.9  mm       34 - 49  RA area, ES, A4C                 (H)     21.3  cm^2     8.3 - 19.5  RA volume, ES, A/L                       70    ml       ----------  RA volume/bsa, ES, A/L                   42.1  ml/m^2   ----------    Right ventricle                          Value          Reference  TAPSE                                    14.7  mm       ----------  RV s&', lateral, S                        7.51  cm/s     ----------  Pulmonic valve                           Value          Reference  Pulmonic valve peak velocity, S          65.5  cm/s     ----------  STS RISK CALCULATOR: Procedure: AV Replacement  Risk of Mortality: 9.782%  Morbidity or Mortality: 39.787%  Long Length of Stay: 25.507%  Short Length of Stay: 6.751%  Permanent Stroke: 6.904%  Prolonged Ventilation: 28.663%  DSW Infection: 0.123%  Renal Failure: 14.097%  Reoperation: 13.138%   ASSESSMENT AND PLAN: Severe, Stage D, Aortic stenosis with chronic diastolic heart failure NYHA II symptoms. The patient's echo study is reviewed and demonstrates severe/critical aortic stenosis with preserved LV systolic function. The aortic valve is severely calcified and restricted. The patient's exam is also consistent with severe aortic stenosis with a harsh late peaking murmur and inaudible aortic valve closure.    I have reviewed the natural history of aortic stenosis with the patient and her daughter who is present today. We have discussed the limitations of medical therapy and the poor prognosis associated with symptomatic aortic  stenosis. We have reviewed potential treatment options, including palliative medical therapy, conventional surgical aortic valve replacement, and transcatheter aortic valve replacement. We discussed treatment options in the context of this patient's specific comorbid medical conditions.   The patient would be at very high risk of conventional surgery in the context of her advanced age, reduced functional capacity, and comorbid medical conditions. TAVR would be a reasonable consideration. We discussed considerations of TAVR versus palliative medical therapy in the context of her advanced age. She clearly has progressive symptoms of severe aortic stenosis with increasing fatigue and shortness of breath. She has some personal issues that she would like to sort out and wants to delay any further consideration of treatment for approximately one month. We had a lengthy discussion regarding procedural expectations of TAVR, typical recovery, and potential complications. The TAVR procedural animation was shown to her today. All of her questions are answered. I will arrange a follow-up visit in approximately 4 weeks before scheduling any further testing. She understands that if she decides to proceed she will require a right and left heart catheterization, CT angiogram studies of the heart and abdominal and pelvic vessels, and formal cardiac surgical evaluation.  Current medicines are reviewed with the patient today.  The patient does not have concerns regarding medicines.  Labs/ tests ordered today include:  No orders of the defined types were placed in this encounter.  Disposition:   FU 1 month  Time spent conducting this evaluation is one hour, over half of this is spent in direct counseling and coordination of care related to her aortic stenosis as detailed above.   Deatra James, MD  07/12/2016 4:01 PM    [facility] [place], [place], [place]  [address] Phone: [phone]; Fax: [phone]

## 2016-08-07 NOTE — Progress Notes (Signed)
Assisted patient to BR to void. Tolerated activity well. Returned to Biomedical scientist.

## 2016-08-08 LAB — POCT I-STAT 3, VENOUS BLOOD GAS (G3P V)
Acid-base deficit: 2 mmol/L (ref 0.0–2.0)
BICARBONATE: 23.1 mmol/L (ref 20.0–28.0)
O2 SAT: 65 %
PCO2 VEN: 41.2 mmHg — AB (ref 44.0–60.0)
PH VEN: 7.356 (ref 7.250–7.430)
PO2 VEN: 35 mmHg (ref 32.0–45.0)
TCO2: 24 mmol/L (ref 0–100)

## 2016-08-09 ENCOUNTER — Emergency Department (HOSPITAL_COMMUNITY): Payer: Medicare Other

## 2016-08-09 ENCOUNTER — Inpatient Hospital Stay (HOSPITAL_COMMUNITY)
Admission: EM | Admit: 2016-08-09 | Discharge: 2016-08-14 | DRG: 246 | Disposition: A | Discharge status: Home / Self Care | Payer: Medicare Other | Attending: Cardiovascular Disease | Admitting: Cardiovascular Disease

## 2016-08-09 ENCOUNTER — Encounter (HOSPITAL_COMMUNITY): Payer: Self-pay | Admitting: Emergency Medicine

## 2016-08-09 ENCOUNTER — Encounter (HOSPITAL_COMMUNITY)
Admission: EM | Disposition: A | Discharge status: Home / Self Care | Payer: Self-pay | Source: Home / Self Care | Attending: Cardiovascular Disease

## 2016-08-09 DIAGNOSIS — I482 Chronic atrial fibrillation: Secondary | ICD-10-CM | POA: Diagnosis present

## 2016-08-09 DIAGNOSIS — Z79899 Other long term (current) drug therapy: Secondary | ICD-10-CM

## 2016-08-09 DIAGNOSIS — I252 Old myocardial infarction: Secondary | ICD-10-CM

## 2016-08-09 DIAGNOSIS — N183 Chronic kidney disease, stage 3 unspecified: Secondary | ICD-10-CM | POA: Diagnosis present

## 2016-08-09 DIAGNOSIS — R54 Age-related physical debility: Secondary | ICD-10-CM | POA: Diagnosis present

## 2016-08-09 DIAGNOSIS — I2102 ST elevation (STEMI) myocardial infarction involving left anterior descending coronary artery: Secondary | ICD-10-CM | POA: Diagnosis not present

## 2016-08-09 DIAGNOSIS — Z955 Presence of coronary angioplasty implant and graft: Secondary | ICD-10-CM

## 2016-08-09 DIAGNOSIS — I130 Hypertensive heart and chronic kidney disease with heart failure and stage 1 through stage 4 chronic kidney disease, or unspecified chronic kidney disease: Secondary | ICD-10-CM | POA: Diagnosis present

## 2016-08-09 DIAGNOSIS — I350 Nonrheumatic aortic (valve) stenosis: Secondary | ICD-10-CM

## 2016-08-09 DIAGNOSIS — I2510 Atherosclerotic heart disease of native coronary artery without angina pectoris: Secondary | ICD-10-CM | POA: Diagnosis not present

## 2016-08-09 DIAGNOSIS — Z888 Allergy status to other drugs, medicaments and biological substances status: Secondary | ICD-10-CM

## 2016-08-09 DIAGNOSIS — Z8673 Personal history of transient ischemic attack (TIA), and cerebral infarction without residual deficits: Secondary | ICD-10-CM

## 2016-08-09 DIAGNOSIS — K219 Gastro-esophageal reflux disease without esophagitis: Secondary | ICD-10-CM | POA: Diagnosis present

## 2016-08-09 DIAGNOSIS — I420 Dilated cardiomyopathy: Secondary | ICD-10-CM | POA: Diagnosis present

## 2016-08-09 DIAGNOSIS — I083 Combined rheumatic disorders of mitral, aortic and tricuspid valves: Secondary | ICD-10-CM | POA: Diagnosis present

## 2016-08-09 DIAGNOSIS — Z7902 Long term (current) use of antithrombotics/antiplatelets: Secondary | ICD-10-CM

## 2016-08-09 DIAGNOSIS — Z833 Family history of diabetes mellitus: Secondary | ICD-10-CM

## 2016-08-09 DIAGNOSIS — Z823 Family history of stroke: Secondary | ICD-10-CM

## 2016-08-09 DIAGNOSIS — I2119 ST elevation (STEMI) myocardial infarction involving other coronary artery of inferior wall: Secondary | ICD-10-CM | POA: Diagnosis not present

## 2016-08-09 DIAGNOSIS — R079 Chest pain, unspecified: Secondary | ICD-10-CM | POA: Diagnosis not present

## 2016-08-09 DIAGNOSIS — Z7982 Long term (current) use of aspirin: Secondary | ICD-10-CM

## 2016-08-09 DIAGNOSIS — I10 Essential (primary) hypertension: Secondary | ICD-10-CM | POA: Diagnosis present

## 2016-08-09 DIAGNOSIS — I255 Ischemic cardiomyopathy: Secondary | ICD-10-CM | POA: Diagnosis present

## 2016-08-09 DIAGNOSIS — Z9049 Acquired absence of other specified parts of digestive tract: Secondary | ICD-10-CM

## 2016-08-09 DIAGNOSIS — I5021 Acute systolic (congestive) heart failure: Secondary | ICD-10-CM | POA: Diagnosis present

## 2016-08-09 DIAGNOSIS — I447 Left bundle-branch block, unspecified: Secondary | ICD-10-CM | POA: Diagnosis present

## 2016-08-09 DIAGNOSIS — Z880 Allergy status to penicillin: Secondary | ICD-10-CM

## 2016-08-09 DIAGNOSIS — I454 Nonspecific intraventricular block: Secondary | ICD-10-CM | POA: Diagnosis present

## 2016-08-09 DIAGNOSIS — I213 ST elevation (STEMI) myocardial infarction of unspecified site: Secondary | ICD-10-CM

## 2016-08-09 HISTORY — PX: CORONARY STENT INTERVENTION: CATH118234

## 2016-08-09 HISTORY — PX: CORONARY ANGIOGRAPHY: CATH118303

## 2016-08-09 LAB — CBC WITH DIFFERENTIAL/PLATELET
Basophils Absolute: 0 10*3/uL (ref 0.0–0.1)
Basophils Relative: 0 %
EOS ABS: 0.4 10*3/uL (ref 0.0–0.7)
Eosinophils Relative: 4 %
HEMATOCRIT: 35 % — AB (ref 36.0–46.0)
HEMOGLOBIN: 12 g/dL (ref 12.0–15.0)
LYMPHS ABS: 4.2 10*3/uL — AB (ref 0.7–4.0)
Lymphocytes Relative: 47 %
MCH: 31.5 pg (ref 26.0–34.0)
MCHC: 34.3 g/dL (ref 30.0–36.0)
MCV: 91.9 fL (ref 78.0–100.0)
MONO ABS: 0.5 10*3/uL (ref 0.1–1.0)
MONOS PCT: 6 %
NEUTROS PCT: 43 %
Neutro Abs: 3.9 10*3/uL (ref 1.7–7.7)
Platelets: 193 10*3/uL (ref 150–400)
RBC: 3.81 MIL/uL — ABNORMAL LOW (ref 3.87–5.11)
RDW: 13.6 % (ref 11.5–15.5)
WBC: 9 10*3/uL (ref 4.0–10.5)

## 2016-08-09 LAB — I-STAT TROPONIN, ED: TROPONIN I, POC: 0.05 ng/mL (ref 0.00–0.08)

## 2016-08-09 SURGERY — CORONARY ANGIOGRAPHY (CATH LAB)
Anesthesia: LOCAL

## 2016-08-09 MED ORDER — CLOPIDOGREL BISULFATE 300 MG PO TABS
ORAL_TABLET | ORAL | Status: DC | PRN
  Administered 2016-08-09: 600 mg via ORAL
  Administered 2016-08-10: 300 mg via ORAL

## 2016-08-09 MED ORDER — SODIUM CHLORIDE 0.9 % IV SOLN: INTRAVENOUS | Status: DC

## 2016-08-09 MED ORDER — CLOPIDOGREL BISULFATE 300 MG PO TABS
ORAL_TABLET | ORAL | Status: AC
  Filled 2016-08-09: qty 1

## 2016-08-09 MED ORDER — ASPIRIN 81 MG PO CHEW
CHEWABLE_TABLET | ORAL | Status: AC
  Administered 2016-08-09: 324 mg via ORAL
  Filled 2016-08-09: qty 4

## 2016-08-09 MED ORDER — ASPIRIN 81 MG PO CHEW
324.0000 mg | CHEWABLE_TABLET | Freq: Once | ORAL | Status: AC
  Administered 2016-08-09: 324 mg via ORAL

## 2016-08-09 MED ORDER — BIVALIRUDIN TRIFLUOROACETATE 250 MG IV SOLR
INTRAVENOUS | Status: AC
  Filled 2016-08-09: qty 250

## 2016-08-09 MED ORDER — NITROGLYCERIN 0.4 MG SL SUBL
0.4000 mg | SUBLINGUAL_TABLET | SUBLINGUAL | Status: DC | PRN
  Filled 2016-08-09: qty 1

## 2016-08-09 MED ORDER — HEPARIN (PORCINE) IN NACL 2-0.9 UNIT/ML-% IJ SOLN
INTRAMUSCULAR | Status: AC | PRN
  Administered 2016-08-09: 1000 mL

## 2016-08-09 MED ORDER — IOPAMIDOL (ISOVUE-370) INJECTION 76%
INTRAVENOUS | Status: AC
  Filled 2016-08-09: qty 125

## 2016-08-09 MED ORDER — FAMOTIDINE IN NACL 20-0.9 MG/50ML-% IV SOLN
INTRAVENOUS | Status: AC
  Filled 2016-08-09: qty 50

## 2016-08-09 MED ORDER — FAMOTIDINE IN NACL 20-0.9 MG/50ML-% IV SOLN
INTRAVENOUS | Status: AC | PRN
  Administered 2016-08-09: 20 mg via INTRAVENOUS

## 2016-08-09 MED ORDER — LIDOCAINE HCL (PF) 1 % IJ SOLN
INTRAMUSCULAR | Status: DC | PRN
  Administered 2016-08-09: 20 mL

## 2016-08-09 MED ORDER — HEPARIN SODIUM (PORCINE) 5000 UNIT/ML IJ SOLN
60.0000 [IU]/kg | Freq: Once | INTRAMUSCULAR | Status: AC
  Administered 2016-08-09: 3750 [IU] via INTRAVENOUS
  Filled 2016-08-09: qty 1

## 2016-08-09 SURGICAL SUPPLY — 20 items
BALLN MOZEC 2.0X12 (BALLOONS) ×2
BALLN ~~LOC~~ EMERGE MR 2.75X12 (BALLOONS) ×2
BALLOON MOZEC 2.0X12 (BALLOONS) ×1 IMPLANT
BALLOON ~~LOC~~ EMERGE MR 2.75X12 (BALLOONS) ×1 IMPLANT
CATH INFINITI JR4 5F (CATHETERS) ×2 IMPLANT
CATH VISTA GUIDE 6FR XBLAD3.5 (CATHETERS) ×2 IMPLANT
GLIDESHEATH SLEND SS 6F .021 (SHEATH) ×2 IMPLANT
GUIDEWIRE 3MM J TIP .035 145 (WIRE) ×2 IMPLANT
GUIDEWIRE INQWIRE 1.5J.035X260 (WIRE) ×1 IMPLANT
INQWIRE 1.5J .035X260CM (WIRE) ×2
KIT HEART LEFT (KITS) ×2 IMPLANT
PACK CARDIAC CATHETERIZATION (CUSTOM PROCEDURE TRAY) ×2 IMPLANT
SHEATH PINNACLE 6F 10CM (SHEATH) ×2 IMPLANT
STENT SYNERGY DES 2.5X16 (Permanent Stent) ×2 IMPLANT
SYR MEDRAD MARK V 150ML (SYRINGE) ×2 IMPLANT
TRANSDUCER W/STOPCOCK (MISCELLANEOUS) ×2 IMPLANT
TUBING CIL FLEX 10 FLL-RA (TUBING) ×2 IMPLANT
WIRE COUGAR XT STRL 190CM (WIRE) ×2 IMPLANT
WIRE HI TORQ VERSACORE-J 145CM (WIRE) ×2 IMPLANT
WIRE HI TORQ WHISPER MS 190CM (WIRE) ×2 IMPLANT

## 2016-08-09 NOTE — ED Notes (Signed)
Transported to cath lab 

## 2016-08-09 NOTE — ED Notes (Signed)
Dr. Merlene Pulling ( cardiologist) explained plan of care to pt. And family .

## 2016-08-09 NOTE — ED Provider Notes (Signed)
[facility] DEPT Provider Note   CSN: [id number] Arrival date & time: 08/09/16  2315  By signing my name below, Alexia Dara Lords, attest that this documentation has been prepared under the direction and in the presence of Sherwood Gambler, MD. Electronically Signed: Wynelle Beckmann, [place] 08/10/2016. 12:13 AM.   History   Chief Complaint Chief Complaint  Patient presents with  . Chest Pain    The history is provided by the patient and the EMS personnel. No language interpreter was used.  HPI Comments:  Dawn Cooper is a 83 y.o. female with a history of atrial fibrillation, HTN, cardiomyopathy and moderate aortic stenosis who presents to the Emergency Department via EMS, complaining of sudden onset, constant mid-sternum chest pain that began 1.5 hours ago.  Pt woke up tonight with moderate chest pain. She reports associated epigastric pain, nausea, vomiting, pallor, and diaphoresis. No alleviating or exacerbating factors noted. No ASA was given PTA. Per EMS, she was seen at her cardiologist 2 days prior, where it was found that pt has a leaky valve and 99.9% blockage .Pt denies any SOB and has no other acute complaints at this time.  Past Medical History:  Diagnosis Date  . A-fib ([place])   . Broken leg    right  . Cardiomyopathy   . Chronic kidney disease   . GERD (gastroesophageal reflux disease)   . Heart murmur   . Hiatal hernia   . Hypertension   . Mild mitral regurgitation by prior echocardiogram   . Moderate aortic stenosis   . Stroke ([place])   . Tricuspid regurgitation     Patient Active Problem List   Diagnosis Date Noted  . Sleeping difficulty 07/01/2016  . B12 deficiency 04/01/2016  . Chronic fatigue 09/13/2014  . History of stroke 09/09/2014  . CKD (chronic kidney disease), stage III 06/16/2014  . CVA (cerebral vascular accident) ([place]) 06/02/2014  . Aortic valve stenosis, severe 05/05/2014  . Lightheadedness 03/22/2014  . Anemia 12/17/2013  . Gait instability  10/23/2011  . GERD (gastroesophageal reflux disease) 07/18/2011  . Pernicious anemia 03/13/2011  . Atrial fibrillation ([place]) 01/14/2011  . Edema 01/14/2011  . HTN (hypertension) 01/14/2011    Past Surgical History:  Procedure Laterality Date  . APPENDECTOMY    . CATARACT EXTRACTION  2013   left  . CHOLECYSTECTOMY    . FEMUR FRACTURE SURGERY  12/2011   Dr. Sabra Heck, s/p rehab at [facility]  . GALLBLADDER SURGERY    . IR RADIOLOGY PERIPHERAL GUIDED IV START  07/29/2016  . IR US GUIDE VASC ACCESS RIGHT  07/29/2016  . VAGINAL DELIVERY      OB History    No data available       Home Medications    Prior to Admission medications   Medication Sig Start Date End Date Taking? Authorizing Provider  aspirin EC 81 MG tablet Take 1 tablet (81 mg total) by mouth daily. 09/10/14   Demetrios Loll, MD  clopidogrel (PLAVIX) 75 MG tablet Take 75 mg by mouth every other day.    [provider]  furosemide (LASIX) 20 MG tablet Take 1 tablet (20 mg total) by mouth every other day. 12/22/15   Minna Merritts, MD  isosorbide mononitrate (IMDUR) 30 MG 24 hr tablet TAKE 1 TABLET EVERY DAY 12/22/15   Minna Merritts, MD  metoprolol tartrate (LOPRESSOR) 25 MG tablet Take 12.5 mg by mouth 2 (two) times daily.    [provider]  pantoprazole (PROTONIX) 40 MG tablet TAKE 1 TABLET  EVERY DAY 09/27/15   Jackolyn Confer, MD  potassium chloride (K-DUR,KLOR-CON) 10 MEQ tablet Take 1 tablet (10 mEq total) by mouth every other day. 03/05/16   Minna Merritts, MD    Family History Family History  Problem Relation Age of Onset  . Stroke Sister   . Cancer Brother        pancreatic  . Cancer Sister        pancreatic  . Diabetes Unknown   . Diabetes Brother   . Heart attack Neg Hx     Social History Social History  Substance Use Topics  . Smoking status: Never Smoker  . Smokeless tobacco: Never Used  . Alcohol use No     Allergies   Pradaxa [dabigatran etexilate mesylate]; Clonidine  derivatives; and Penicillins   Review of Systems Review of Systems  Constitutional: Positive for diaphoresis.  Respiratory: Negative for shortness of breath.   Cardiovascular: Positive for chest pain.  Gastrointestinal: Positive for nausea and vomiting.  Skin: Positive for pallor.  All other systems reviewed and are negative.  Physical Exam Updated Vital Signs BP (!) 153/101 (BP Location: Left Arm)   Pulse 82   Resp 20   SpO2 99%   Physical Exam  Constitutional: She is oriented to person, place, and time. She appears well-developed and well-nourished.  HENT:  Head: Normocephalic and atraumatic.  Right Ear: External ear normal.  Left Ear: External ear normal.  Nose: Nose normal.  Eyes: Right eye exhibits no discharge. Left eye exhibits no discharge.  Cardiovascular: Normal rate, regular rhythm and normal heart sounds.   2+ radial pulses bilaterally  Pulmonary/Chest: Effort normal and breath sounds normal.  Abdominal: Soft. There is no tenderness.  Musculoskeletal: She exhibits no edema.  Neurological: She is alert and oriented to person, place, and time.  Skin: Skin is warm. She is diaphoretic.  Nursing note and vitals reviewed.    ED Treatments / Results  DIAGNOSTIC STUDIES:  Oxygen Saturation is 99% on [place], normal by my interpretation.    COORDINATION OF CARE:  11:28 PM Discussed treatment plan with pt at bedside and pt agreed to plan.  Labs (all labs ordered are listed, but only abnormal results are displayed) Labs Reviewed  CBC WITH DIFFERENTIAL/PLATELET - Abnormal; Notable for the following:       Result Value   RBC 3.81 (*)    HCT 35.0 (*)    Lymphs Abs 4.2 (*)    All other components within normal limits  PROTIME-INR  APTT  COMPREHENSIVE METABOLIC PANEL  TROPONIN I  LIPID PANEL  I-STAT TROPOININ, ED    EKG  EKG Interpretation  Date/Time:  Friday Aug 09 2016 23:22:57 EDT Ventricular Rate:  76 PR Interval:    QRS Duration: 150 QT  Interval:  425 QTC Calculation: 478 R Axis:   -46 Text Interpretation:  Atrial fibrillation Left bundle branch block ST elevation I, AVL new compared to Aug 07 2016 ** ** ACUTE MI / STEMI ** ** Confirmed by Sherwood Gambler [phone]) on 08/09/2016 11:34:06 PM       Radiology Dg Chest Port 1 View  Result Date: 08/09/2016 CLINICAL DATA:  Chest pain EXAM: PORTABLE CHEST 1 VIEW COMPARISON:  09/09/2014 FINDINGS: Moderate cardiomegaly. Streaky bibasilar atelectasis or mild infiltrate. Tiny pleural effusion. Aortic atherosclerosis. No pneumothorax. IMPRESSION: Moderate cardiomegaly without edema. Tiny bilateral effusions and streaky bibasilar atelectasis or infiltrates. Electronically Signed   By: Donavan Foil M.D.   On: 08/09/2016 23:45    Procedures  Procedures (including critical care time)   Medications Ordered in ED Medications  0.9 %  sodium chloride infusion (not administered)  nitroGLYCERIN (NITROSTAT) SL tablet 0.4 mg ( Sublingual MAR Hold 08/09/16 2350)  heparin infusion 2 units/mL in 0.9 % sodium chloride (1,000 mLs Other New Bag/Given 08/09/16 2351)  clopidogrel (PLAVIX) tablet (600 mg Oral Given 08/09/16 2351)  lidocaine (PF) (XYLOCAINE) 1 % injection (20 mLs Infiltration Given 08/09/16 2353)  famotidine (PEPCID) IVPB 20 mg premix (20 mg Intravenous New Bag/Given 08/09/16 2355)  bivalirudin (ANGIOMAX) BOLUS via infusion (46.95 mg Intravenous Given 08/10/16 0001)  bivalirudin (ANGIOMAX) 250 mg in sodium chloride 0.9 % 50 mL (5 mg/mL) infusion (0.25 mg/kg/hr  62.6 kg Intravenous New Bag/Given 08/10/16 0004)  fentaNYL (SUBLIMAZE) injection (25 mcg Intravenous Given 08/10/16 0003)  aspirin chewable tablet 324 mg (324 mg Oral Given 08/09/16 2327)  heparin injection 3,750 Units (3,750 Units Intravenous Given 08/09/16 2331)     Initial Impression / Assessment and Plan / ED Course  I have reviewed the triage vital signs and the nursing notes.  Pertinent labs & imaging results that were  available during my care of the patient were reviewed by me and considered in my medical decision making (see chart for details).     Patient's ECG is consistent with STEMI. She was given aspirin and heparin. Discussion with cardiology and patient and family, patient and family and agreement to go to the Cath Lab and understand the risks and also will benefits. Otherwise she was hemodynamically stable while in the ED and continuing to have chest pain. Doubt dissection or PE. Taken emergently to the Cath Lab with cardiology.  Final Clinical Impressions(s) / ED Diagnoses   Final diagnoses:  ST elevation myocardial infarction (STEMI), unspecified artery [facility])    New Prescriptions Current Discharge Medication List      I personally performed the services described in this documentation, which was scribed in my presence. The recorded information has been reviewed and is accurate.     Sherwood Gambler, MD 08/10/16 [phone]

## 2016-08-09 NOTE — ED Triage Notes (Signed)
Patient arrived with EMS from home reports central chest pain with diaphoresis and nausea onset this evening , mild SOB , emesis x1 .

## 2016-08-09 NOTE — H&P (Addendum)
Cardiology History & Physical    Patient ID: Dawn Cooper MRN: [id number], DOB: 24-Mar-1919 Date of Encounter: 08/09/2016, 11:47 PM Primary Physician: Leone Haven, MD  Chief Complaint: CP  HPI: Dawn Cooper is a 83 y.o. female with history of severe AS (undergoing TAVR workup), CKD, who presents with CP, found to have STEMI.  Of note, pt had R/L heart cath as part of TAVR w/u 2 days prior, and was found to have severely calcified 90% pLAD stenosis, along with bifurcation disease at D1.  RHC showed normal filling pressures and normal CI.  This evening, she had acute onset CP at 10:45 PM, along with emesis.  EMS was called, and initial ECG showed left bundeloid IVCD (chronic) along with new anterolateral STE, and inferior STD.   In the ED she was hypertensive and received SL nitro, but continued to have ongoing CP.  Past Medical History:  Diagnosis Date  . A-fib ([place])   . Broken leg    right  . Cardiomyopathy   . Chronic kidney disease   . GERD (gastroesophageal reflux disease)   . Heart murmur   . Hiatal hernia   . Hypertension   . Mild mitral regurgitation by prior echocardiogram   . Moderate aortic stenosis   . Stroke ([place])   . Tricuspid regurgitation      Surgical History:  Past Surgical History:  Procedure Laterality Date  . APPENDECTOMY    . CATARACT EXTRACTION  2013   left  . CHOLECYSTECTOMY    . FEMUR FRACTURE SURGERY  12/2011   Dr. Sabra Heck, s/p rehab at [facility]  . GALLBLADDER SURGERY    . IR RADIOLOGY PERIPHERAL GUIDED IV START  07/29/2016  . IR US GUIDE VASC ACCESS RIGHT  07/29/2016  . VAGINAL DELIVERY       Home Meds: Prior to Admission medications   Medication Sig Start Date End Date Taking? Authorizing Provider  aspirin EC 81 MG tablet Take 1 tablet (81 mg total) by mouth daily. 09/10/14   Demetrios Loll, MD  clopidogrel (PLAVIX) 75 MG tablet Take 75 mg by mouth every other day.    [provider]  furosemide (LASIX) 20 MG tablet Take 1  tablet (20 mg total) by mouth every other day. 12/22/15   Minna Merritts, MD  isosorbide mononitrate (IMDUR) 30 MG 24 hr tablet TAKE 1 TABLET EVERY DAY 12/22/15   Minna Merritts, MD  metoprolol tartrate (LOPRESSOR) 25 MG tablet Take 12.5 mg by mouth 2 (two) times daily.    [provider]  pantoprazole (PROTONIX) 40 MG tablet TAKE 1 TABLET EVERY DAY 09/27/15   Jackolyn Confer, MD  potassium chloride (K-DUR,KLOR-CON) 10 MEQ tablet Take 1 tablet (10 mEq total) by mouth every other day. 03/05/16   Minna Merritts, MD    Allergies:  Allergies  Allergen Reactions  . Pradaxa [Dabigatran Etexilate Mesylate] Other (See Comments)    Reaction:  Bleeding   . Clonidine Derivatives Itching and Rash  . Penicillins Itching and Rash    Social History   Social History  . Marital status: Widowed    Spouse name: N/A  . Number of children: N/A  . Years of education: N/A   Occupational History  . Not on file.   Social History Main Topics  . Smoking status: Never Smoker  . Smokeless tobacco: Never Used  . Alcohol use No  . Drug use: No  . Sexual activity: No   Other Topics Concern  .  Not on file   Social History Narrative   Lives in [place] alone. 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     Family History  Problem Relation Age of Onset  . Stroke Sister   . Cancer Brother        pancreatic  . Cancer Sister        pancreatic  . Diabetes Unknown   . Diabetes Brother   . Heart attack Neg Hx     Review of Systems: All other systems reviewed and are otherwise negative except as noted above in HPI.  Labs:   Lab Results  Component Value Date   WBC 6.1 08/07/2016   HGB 12.2 08/07/2016   HCT 35.7 (L) 08/07/2016   MCV 91.1 08/07/2016   PLT 185 08/07/2016    Recent Labs Lab 08/07/16 0919  NA 139  K 4.5  CL 109  CO2 24  BUN 16  CREATININE 1.80*  CALCIUM 9.2  GLUCOSE 115*   No results for input(s): CKTOTAL, CKMB, TROPONINI in the last 72 hours. Lab Results    Component Value Date   CHOL 168 09/10/2014   HDL 30 (L) 09/10/2014   LDLCALC 120 (H) 09/10/2014   TRIG 90 09/10/2014   No results found for: DDIMER  Radiology/Studies:  Ir US Guide Vasc Access Right  Result Date: 07/29/2016 CLINICAL DATA:  Poor venous access. In need of intravenous access for impending contrast-enhanced CT. EXAM: ULTRASOUND GUIDED VENIPUNCTURE BY MD CONTRAST:  None COMPLICATIONS: None immediate TECHNIQUE: The right upper arm was prepped with chlorhexidine in a sterile fashion, and a sterile drape was applied covering the operative field. Local anesthesia was provided with 1% lidocaine. Under direct ultrasound guidance, the right basilic vein was accessed with a micropuncture kit after the overlying soft tissues were anesthetized with 1% lidocaine. An ultrasound image was saved for documentation purposes. The micropuncture sheath easily aspirated and flushed and was secured in place. A dressing was placed. The patient tolerated the procedure well without immediate post procedural complication. IMPRESSION: Successful ultrasound guided placement of a right basilic vein approach micropuncture sheath for temporary venous access. Electronically Signed   By: Sandi Mariscal M.D.   On: 07/29/2016 14:23   Ct Coronary Morph W/cta Cor W/score W/ca W/cm &/or Wo/cm  Addendum Date: 07/29/2016   ADDENDUM REPORT: 07/29/2016 17:39 CLINICAL DATA:  Aortic stenosis EXAM: Cardiac TAVR CT TECHNIQUE: The patient was scanned on a Siemens 128 scanner. A 120 kV retrospective scan was triggered in the ascending thoracic aorta at 129 HU's. Gantry rotation speed was 300 msecs and collimation was .6 mm. No beta blockade or nitro were given. The 3D data set was reconstructed in 10% intervals of the R-R cycle. Systolic and diastolic phases were analyzed on a dedicated work station using MPR, MIP and VRT modes. The patient received 80 cc of contrast. FINDINGS: Aortic Valve: Trileaflet and severely calcified with  restricted motion Aorta: Moderate calcific atherosclerosis with severe tortuosity of the descending thoracic aorta Sinotubular Junction:  28 mm Ascending Thoracic Aorta:  32 mm Aortic Arch:  27 mm Descending Thoracic Aorta:  25 mm Sinus of Valsalva Measurements: Non-coronary:  31 mm Right -coronary:  29 mm Left -coronary:  28 mm Coronary Artery Height above Annulus: Left Main:  10.4 mm Right Coronary:  13.7 mm Virtual Basal Annulus Measurements: Maximum/Minimum Diameter:  23.3 x 17.2 Perimeter:  65.4 mm Area:  354 mm2 Coronary Arteries:  Sufficient height above annulus for deployment Optimum Fluoroscopic Angle for Delivery: LAO 32 Cranial  12 or LAO 27 no cranial caudal IMPRESSION: 1) Tri-leaflet calcified aortic valve with annulus suitable for 23 mm Sapien 3 valve 2) Optimum angle for deployment LAO 32 Cranial 12 or LAO 27 degrees 3) Coronary arteries sufficient height above annulus for deployment 4) Marked tortuosity of descending thoracic aorta [place] Rouge Electronically Signed   By: Jenkins Rouge M.D.   On: 07/29/2016 17:39   Result Date: 07/29/2016 EXAM: OVER-READ INTERPRETATION  CT CHEST The following report is an over-read performed by radiologist Dr. Rebekah Chesterfield [facility] on 07/29/2016. This over-read does not include interpretation of cardiac or coronary anatomy or pathology. The coronary calcium score/coronary CTA interpretation by the cardiologist is attached. COMPARISON:  CTA of the chest, abdomen and pelvis 07/23/2016. FINDINGS: Aortic atherosclerosis. Within the visualized portions of the thorax there are no suspicious appearing pulmonary nodules or masses, there is no acute consolidative airspace disease, no pleural effusions, no pneumothorax and no lymphadenopathy. Visualized portions of the upper abdomen are unremarkable. There are no aggressive appearing lytic or blastic lesions noted in the visualized portions of the skeleton. IMPRESSION: 1. Aortic atherosclerosis.  Electronically Signed: By: Vinnie Langton M.D. On: 07/29/2016 17:10   Ct Angio Chest Aorta W &/or Wo Contrast  Result Date: 07/23/2016 CLINICAL DATA:  83 year old female with history of severe aortic stenosis. Preprocedural study prior to potential transcatheter aortic valve replacement (TAVR) procedure. EXAM: CT ANGIOGRAPHY CHEST, ABDOMEN AND PELVIS TECHNIQUE: Multidetector CT imaging through the chest, abdomen and pelvis was performed using the standard protocol during bolus administration of intravenous contrast. Multiplanar reconstructed images and MIPs were obtained and reviewed to evaluate the vascular anatomy. CONTRAST:  80 mL of Isovue 370. COMPARISON:  CT of the chest 01/02/2011. FINDINGS: CTA CHEST FINDINGS Cardiovascular: Heart size is mildly enlarged with left atrial dilatation. There is no significant pericardial fluid, thickening or pericardial calcification. There is aortic atherosclerosis, as well as atherosclerosis of the great vessels of the mediastinum and the coronary arteries, including calcified atherosclerotic plaque in the left main, left anterior descending, left circumflex and right coronary arteries. Severe thickening calcification of the aortic valve. Mild mitral annular calcification. Filling defect in the tip of the left atrial appendage concerning for thrombus. Mediastinum/Lymph Nodes: No pathologically enlarged mediastinal or hilar lymph nodes. Small hiatal hernia. No axillary lymphadenopathy. Lungs/Pleura: Patchy areas of mild scarring are noted throughout the lung bases bilaterally. No acute consolidative airspace disease. No pleural effusions. No suspicious appearing pulmonary nodules or masses. Musculoskeletal/Soft Tissues: There are no aggressive appearing lytic or blastic lesions noted in the visualized portions of the skeleton. Old healed fracture of the manubrium incidentally noted. CTA ABDOMEN AND PELVIS FINDINGS Hepatobiliary: The liver has a shrunken appearance and  nodular contour, suggestive of underlying cirrhosis. No discrete cystic or solid hepatic lesions. No intra or extrahepatic biliary ductal dilatation. Status post cholecystectomy. Pancreas: No pancreatic mass. No pancreatic ductal dilatation. No pancreatic or peripancreatic fluid or inflammatory changes. Spleen: Calcified granuloma in the spleen incidentally noted. Otherwise, unremarkable. Adrenals/Urinary Tract: Mild bilateral renal atrophy. No suspicious renal lesions. No hydroureteronephrosis. Urinary bladder is normal in appearance. Bilateral adrenal glands are normal in appearance. Stomach/Bowel: The appearance of the stomach is unremarkable. No pathologic dilatation of small bowel or colon. Small duodenal diverticulum extending off the posterior aspect of the second portion of the duodenum incidentally noted. No surrounding inflammatory changes. Numerous colonic diverticulae are noted, particularly throughout the sigmoid colon, without surrounding inflammatory changes to suggest an acute diverticulitis at this time. The appendix  is not confidently identified and may be surgically absent. Regardless, there are no inflammatory changes noted adjacent to the cecum to suggest the presence of an acute appendicitis at this time. Vascular/Lymphatic: Aortic atherosclerosis, with vascular findings and measurements pertinent to potential TAVR procedure, as detailed below. No aneurysm or dissection noted in the abdominal or pelvic vasculature. Celiac axis, superior mesenteric artery and inferior mesenteric artery are all widely patent without hemodynamically significant stenosis. Single renal artery is bilaterally also appear patent without significant stenoses. Reproductive: Uterus and ovaries are atrophic. Other: No significant volume of ascites.  No pneumoperitoneum. Musculoskeletal: There are no aggressive appearing lytic or blastic lesions noted in the visualized portions of the skeleton. Status post ORIF in the right  proximal femur. VASCULAR MEASUREMENTS PERTINENT TO TAVR: AORTA: Minimal Aortic Diameter -  12 x 14 mm Severity of Aortic Calcification -  moderate to severe RIGHT PELVIS: Right Common Iliac Artery - Minimal Diameter - 9.7 x 9.6 mm Tortuosity - mild Calcification - mild Right External Iliac Artery - Minimal Diameter - 5.4 x 5.7 mm Tortuosity - mild to moderate Calcification - none Right Common Femoral Artery - Minimal Diameter - 6.2 x 6.1 mm Tortuosity - mild Calcification - none LEFT PELVIS: Left Common Iliac Artery - Minimal Diameter - 6.7 x 7.7 mm Tortuosity - mild Calcification - mild Left External Iliac Artery - Minimal Diameter - 6.1 x 6.6 mm Tortuosity - moderate Calcification - none Left Common Femoral Artery - Minimal Diameter - 6.3 x 6.6 mm Tortuosity - mild Calcification - none Review of the MIP images confirms the above findings. IMPRESSION: 1. Vascular findings and measurements pertinent to potential TAVR procedure, as detailed above. This patient appears to have suitable pelvic arterial access bilaterally, more optimal on the left secondary to a slightly larger size of the left external iliac artery. 2. Severe thickening calcification of the aortic valve, compatible with the reported clinical history of severe aortic stenosis. 3. Filling defect in the tip of the left atrial appendage concerning for left atrial appendage thrombus. While there is the possibility that this could be so-called " "seudothrombus " " " to incomplete mixing of contrast material, if left atrial appendage thrombus is present, this places the patient at risk for potential systemic embolization. Correlation with transesophageal echocardiography is suggested if not recently performed. 4. Aortic atherosclerosis, in addition to left main and 3 vessel coronary artery disease. Please note that although the presence of coronary artery calcium documents the presence of coronary artery disease, the severity of this disease and any potential  stenosis cannot be assessed on this non-gated CT examination. Assessment for potential risk factor modification, dietary therapy or pharmacologic therapy may be warranted, if clinically indicated. 5. Cardiomegaly with mild left atrial dilatation. 6. Morphologic changes in the liver suggestive of underlying cirrhosis. 7. Colonic diverticulosis without evidence of acute diverticulitis at this time. 8. Additional incidental findings, as above. These results will be called to the ordering clinician or representative by the Radiologist Assistant, and communication documented in the PACS or zVision Dashboard. Electronically Signed   By: Vinnie Langton M.D.   On: 07/23/2016 13:54   Ir Radiology Peripheral Guided Iv Start  Result Date: 07/29/2016 CLINICAL DATA:  Poor venous access. In need of intravenous access for impending contrast-enhanced CT. EXAM: ULTRASOUND GUIDED VENIPUNCTURE BY MD CONTRAST:  None COMPLICATIONS: None immediate TECHNIQUE: The right upper arm was prepped with chlorhexidine in a sterile fashion, and a sterile drape was applied covering the operative field. Local  anesthesia was provided with 1% lidocaine. Under direct ultrasound guidance, the right basilic vein was accessed with a micropuncture kit after the overlying soft tissues were anesthetized with 1% lidocaine. An ultrasound image was saved for documentation purposes. The micropuncture sheath easily aspirated and flushed and was secured in place. A dressing was placed. The patient tolerated the procedure well without immediate post procedural complication. IMPRESSION: Successful ultrasound guided placement of a right basilic vein approach micropuncture sheath for temporary venous access. Electronically Signed   By: Sandi Mariscal M.D.   On: 07/29/2016 14:23   Ct Angio Abd/pel W/ And/or W/o  Result Date: 07/23/2016 CLINICAL DATA:  82 year old female with history of severe aortic stenosis. Preprocedural study prior to potential transcatheter  aortic valve replacement (TAVR) procedure. EXAM: CT ANGIOGRAPHY CHEST, ABDOMEN AND PELVIS TECHNIQUE: Multidetector CT imaging through the chest, abdomen and pelvis was performed using the standard protocol during bolus administration of intravenous contrast. Multiplanar reconstructed images and MIPs were obtained and reviewed to evaluate the vascular anatomy. CONTRAST:  80 mL of Isovue 370. COMPARISON:  CT of the chest 01/02/2011. FINDINGS: CTA CHEST FINDINGS Cardiovascular: Heart size is mildly enlarged with left atrial dilatation. There is no significant pericardial fluid, thickening or pericardial calcification. There is aortic atherosclerosis, as well as atherosclerosis of the great vessels of the mediastinum and the coronary arteries, including calcified atherosclerotic plaque in the left main, left anterior descending, left circumflex and right coronary arteries. Severe thickening calcification of the aortic valve. Mild mitral annular calcification. Filling defect in the tip of the left atrial appendage concerning for thrombus. Mediastinum/Lymph Nodes: No pathologically enlarged mediastinal or hilar lymph nodes. Small hiatal hernia. No axillary lymphadenopathy. Lungs/Pleura: Patchy areas of mild scarring are noted throughout the lung bases bilaterally. No acute consolidative airspace disease. No pleural effusions. No suspicious appearing pulmonary nodules or masses. Musculoskeletal/Soft Tissues: There are no aggressive appearing lytic or blastic lesions noted in the visualized portions of the skeleton. Old healed fracture of the manubrium incidentally noted. CTA ABDOMEN AND PELVIS FINDINGS Hepatobiliary: The liver has a shrunken appearance and nodular contour, suggestive of underlying cirrhosis. No discrete cystic or solid hepatic lesions. No intra or extrahepatic biliary ductal dilatation. Status post cholecystectomy. Pancreas: No pancreatic mass. No pancreatic ductal dilatation. No pancreatic or  peripancreatic fluid or inflammatory changes. Spleen: Calcified granuloma in the spleen incidentally noted. Otherwise, unremarkable. Adrenals/Urinary Tract: Mild bilateral renal atrophy. No suspicious renal lesions. No hydroureteronephrosis. Urinary bladder is normal in appearance. Bilateral adrenal glands are normal in appearance. Stomach/Bowel: The appearance of the stomach is unremarkable. No pathologic dilatation of small bowel or colon. Small duodenal diverticulum extending off the posterior aspect of the second portion of the duodenum incidentally noted. No surrounding inflammatory changes. Numerous colonic diverticulae are noted, particularly throughout the sigmoid colon, without surrounding inflammatory changes to suggest an acute diverticulitis at this time. The appendix is not confidently identified and may be surgically absent. Regardless, there are no inflammatory changes noted adjacent to the cecum to suggest the presence of an acute appendicitis at this time. Vascular/Lymphatic: Aortic atherosclerosis, with vascular findings and measurements pertinent to potential TAVR procedure, as detailed below. No aneurysm or dissection noted in the abdominal or pelvic vasculature. Celiac axis, superior mesenteric artery and inferior mesenteric artery are all widely patent without hemodynamically significant stenosis. Single renal artery is bilaterally also appear patent without significant stenoses. Reproductive: Uterus and ovaries are atrophic. Other: No significant volume of ascites.  No pneumoperitoneum. Musculoskeletal: There are no aggressive appearing lytic  or blastic lesions noted in the visualized portions of the skeleton. Status post ORIF in the right proximal femur. VASCULAR MEASUREMENTS PERTINENT TO TAVR: AORTA: Minimal Aortic Diameter -  12 x 14 mm Severity of Aortic Calcification -  moderate to severe RIGHT PELVIS: Right Common Iliac Artery - Minimal Diameter - 9.7 x 9.6 mm Tortuosity - mild  Calcification - mild Right External Iliac Artery - Minimal Diameter - 5.4 x 5.7 mm Tortuosity - mild to moderate Calcification - none Right Common Femoral Artery - Minimal Diameter - 6.2 x 6.1 mm Tortuosity - mild Calcification - none LEFT PELVIS: Left Common Iliac Artery - Minimal Diameter - 6.7 x 7.7 mm Tortuosity - mild Calcification - mild Left External Iliac Artery - Minimal Diameter - 6.1 x 6.6 mm Tortuosity - moderate Calcification - none Left Common Femoral Artery - Minimal Diameter - 6.3 x 6.6 mm Tortuosity - mild Calcification - none Review of the MIP images confirms the above findings. IMPRESSION: 1. Vascular findings and measurements pertinent to potential TAVR procedure, as detailed above. This patient appears to have suitable pelvic arterial access bilaterally, more optimal on the left secondary to a slightly larger size of the left external iliac artery. 2. Severe thickening calcification of the aortic valve, compatible with the reported clinical history of severe aortic stenosis. 3. Filling defect in the tip of the left atrial appendage concerning for left atrial appendage thrombus. While there is the possibility that this could be so-called " "seudothrombus " " " to incomplete mixing of contrast material, if left atrial appendage thrombus is present, this places the patient at risk for potential systemic embolization. Correlation with transesophageal echocardiography is suggested if not recently performed. 4. Aortic atherosclerosis, in addition to left main and 3 vessel coronary artery disease. Please note that although the presence of coronary artery calcium documents the presence of coronary artery disease, the severity of this disease and any potential stenosis cannot be assessed on this non-gated CT examination. Assessment for potential risk factor modification, dietary therapy or pharmacologic therapy may be warranted, if clinically indicated. 5. Cardiomegaly with mild left atrial dilatation.  6. Morphologic changes in the liver suggestive of underlying cirrhosis. 7. Colonic diverticulosis without evidence of acute diverticulitis at this time. 8. Additional incidental findings, as above. These results will be called to the ordering clinician or representative by the Radiologist Assistant, and communication documented in the PACS or zVision Dashboard. Electronically Signed   By: Vinnie Langton M.D.   On: 07/23/2016 13:54   Wt Readings from Last 3 Encounters:  08/07/16 62.6 kg (138 lb)  07/12/16 64.2 kg (141 lb 9.6 oz)  07/01/16 64.5 kg (142 lb 3.2 oz)    EKG: AF, left bundleoid IVCD, anterolateral STE, inferior STD.  Physical Exam: Blood pressure (!) 153/101, pulse 82, resp. rate 20, SpO2 99 %. There is no height or weight on file to calculate BMI. General: Elderly, in moderate distress Head: Normocephalic, atraumatic, sclera non-icteric, no xanthomas, nares are without discharge.  Neck: Negative for carotid bruits. JVD not elevated. Lungs: Clear bilaterally to auscultation without wheezes, rales, or rhonchi. Breathing is unlabored. Heart: Irregularly irregular, 3/6 SEM at base, S2 diminished. Abdomen: Soft, non-tender, non-distended with normoactive bowel sounds. No hepatomegaly. No rebound/guarding. No obvious abdominal masses. Msk:  Strength and tone appear normal for age. Extremities: No clubbing or cyanosis. No edema.  Distal pedal pulses are 2+ and equal bilaterally. Neuro: Alert and oriented X 3. No focal deficit. No facial asymmetry. Moves all extremities  spontaneously. Psych:  Responds to questions appropriately with a normal affect.    Assessment and Plan   83 y.o. female with history of severe AS (undergoing TAVR workup), CKD, who presents with CP, found to have anterolateral STEMI.  After discussion of risks and benefits with patient and family, they wish to proceed with catheterization at present, and confirmed that she is full code.  We will plan to take her  emergently to cath lab.    Merrilee Seashore MD 08/09/2016, 11:47 PM

## 2016-08-10 ENCOUNTER — Inpatient Hospital Stay (HOSPITAL_COMMUNITY): Payer: Medicare Other

## 2016-08-10 DIAGNOSIS — Z833 Family history of diabetes mellitus: Secondary | ICD-10-CM | POA: Diagnosis not present

## 2016-08-10 DIAGNOSIS — Z880 Allergy status to penicillin: Secondary | ICD-10-CM | POA: Diagnosis not present

## 2016-08-10 DIAGNOSIS — Z888 Allergy status to other drugs, medicaments and biological substances status: Secondary | ICD-10-CM | POA: Diagnosis not present

## 2016-08-10 DIAGNOSIS — I454 Nonspecific intraventricular block: Secondary | ICD-10-CM | POA: Diagnosis present

## 2016-08-10 DIAGNOSIS — R54 Age-related physical debility: Secondary | ICD-10-CM | POA: Diagnosis present

## 2016-08-10 DIAGNOSIS — Z9049 Acquired absence of other specified parts of digestive tract: Secondary | ICD-10-CM | POA: Diagnosis not present

## 2016-08-10 DIAGNOSIS — Z7982 Long term (current) use of aspirin: Secondary | ICD-10-CM | POA: Diagnosis not present

## 2016-08-10 DIAGNOSIS — I252 Old myocardial infarction: Secondary | ICD-10-CM

## 2016-08-10 DIAGNOSIS — I340 Nonrheumatic mitral (valve) insufficiency: Secondary | ICD-10-CM | POA: Diagnosis not present

## 2016-08-10 DIAGNOSIS — I2102 ST elevation (STEMI) myocardial infarction involving left anterior descending coronary artery: Principal | ICD-10-CM

## 2016-08-10 DIAGNOSIS — Z7902 Long term (current) use of antithrombotics/antiplatelets: Secondary | ICD-10-CM | POA: Diagnosis not present

## 2016-08-10 DIAGNOSIS — I482 Chronic atrial fibrillation: Secondary | ICD-10-CM | POA: Diagnosis not present

## 2016-08-10 DIAGNOSIS — I420 Dilated cardiomyopathy: Secondary | ICD-10-CM | POA: Diagnosis present

## 2016-08-10 DIAGNOSIS — N183 Chronic kidney disease, stage 3 (moderate): Secondary | ICD-10-CM | POA: Diagnosis present

## 2016-08-10 DIAGNOSIS — I5021 Acute systolic (congestive) heart failure: Secondary | ICD-10-CM | POA: Diagnosis present

## 2016-08-10 DIAGNOSIS — Z79899 Other long term (current) drug therapy: Secondary | ICD-10-CM | POA: Diagnosis not present

## 2016-08-10 DIAGNOSIS — N184 Chronic kidney disease, stage 4 (severe): Secondary | ICD-10-CM | POA: Diagnosis not present

## 2016-08-10 DIAGNOSIS — I130 Hypertensive heart and chronic kidney disease with heart failure and stage 1 through stage 4 chronic kidney disease, or unspecified chronic kidney disease: Secondary | ICD-10-CM | POA: Diagnosis present

## 2016-08-10 DIAGNOSIS — I350 Nonrheumatic aortic (valve) stenosis: Secondary | ICD-10-CM | POA: Diagnosis not present

## 2016-08-10 DIAGNOSIS — I447 Left bundle-branch block, unspecified: Secondary | ICD-10-CM | POA: Diagnosis present

## 2016-08-10 DIAGNOSIS — K219 Gastro-esophageal reflux disease without esophagitis: Secondary | ICD-10-CM | POA: Diagnosis present

## 2016-08-10 DIAGNOSIS — Z823 Family history of stroke: Secondary | ICD-10-CM | POA: Diagnosis not present

## 2016-08-10 DIAGNOSIS — R079 Chest pain, unspecified: Secondary | ICD-10-CM | POA: Diagnosis not present

## 2016-08-10 DIAGNOSIS — I083 Combined rheumatic disorders of mitral, aortic and tricuspid valves: Secondary | ICD-10-CM | POA: Diagnosis present

## 2016-08-10 DIAGNOSIS — Z8673 Personal history of transient ischemic attack (TIA), and cerebral infarction without residual deficits: Secondary | ICD-10-CM | POA: Diagnosis not present

## 2016-08-10 DIAGNOSIS — I255 Ischemic cardiomyopathy: Secondary | ICD-10-CM | POA: Diagnosis not present

## 2016-08-10 DIAGNOSIS — I2510 Atherosclerotic heart disease of native coronary artery without angina pectoris: Secondary | ICD-10-CM | POA: Diagnosis present

## 2016-08-10 LAB — COMPREHENSIVE METABOLIC PANEL
ALBUMIN: 3.1 g/dL — AB (ref 3.5–5.0)
ALT: 10 U/L — ABNORMAL LOW (ref 14–54)
ANION GAP: 7 (ref 5–15)
AST: 21 U/L (ref 15–41)
Alkaline Phosphatase: 58 U/L (ref 38–126)
BILIRUBIN TOTAL: 0.5 mg/dL (ref 0.3–1.2)
BUN: 18 mg/dL (ref 6–20)
CALCIUM: 9.1 mg/dL (ref 8.9–10.3)
CO2: 21 mmol/L — AB (ref 22–32)
CREATININE: 1.66 mg/dL — AB (ref 0.44–1.00)
Chloride: 111 mmol/L (ref 101–111)
GFR calc Af Amer: 29 mL/min — ABNORMAL LOW (ref >60)
GFR calc non Af Amer: 25 mL/min — ABNORMAL LOW (ref >60)
Glucose, Bld: 161 mg/dL — ABNORMAL HIGH (ref 65–99)
POTASSIUM: 4 mmol/L (ref 3.5–5.1)
SODIUM: 139 mmol/L (ref 135–145)
Total Protein: 6 g/dL — ABNORMAL LOW (ref 6.5–8.1)

## 2016-08-10 LAB — BASIC METABOLIC PANEL
ANION GAP: 8 (ref 5–15)
Anion gap: 8 (ref 5–15)
BUN: 17 mg/dL (ref 6–20)
BUN: 16 mg/dL (ref 6–20)
CALCIUM: 9.1 mg/dL (ref 8.9–10.3)
CO2: 19 mmol/L — ABNORMAL LOW (ref 22–32)
CO2: 20 mmol/L — ABNORMAL LOW (ref 22–32)
CREATININE: 1.67 mg/dL — AB (ref 0.44–1.00)
Calcium: 8.8 mg/dL — ABNORMAL LOW (ref 8.9–10.3)
Chloride: 109 mmol/L (ref 101–111)
Chloride: 108 mmol/L (ref 101–111)
Creatinine, Ser: 1.56 mg/dL — ABNORMAL HIGH (ref 0.44–1.00)
GFR calc Af Amer: 29 mL/min — ABNORMAL LOW (ref >60)
GFR, EST AFRICAN AMERICAN: 31 mL/min — AB (ref >60)
GFR, EST NON AFRICAN AMERICAN: 27 mL/min — AB (ref >60)
GFR, EST NON AFRICAN AMERICAN: 25 mL/min — AB (ref >60)
GLUCOSE: 179 mg/dL — AB (ref 65–99)
Glucose, Bld: 188 mg/dL — ABNORMAL HIGH (ref 65–99)
POTASSIUM: 4.1 mmol/L (ref 3.5–5.1)
POTASSIUM: 4.8 mmol/L (ref 3.5–5.1)
SODIUM: 136 mmol/L (ref 135–145)
SODIUM: 136 mmol/L (ref 135–145)

## 2016-08-10 LAB — PROTIME-INR
INR: 1.15
PROTHROMBIN TIME: 14.7 s (ref 11.4–15.2)

## 2016-08-10 LAB — LIPID PANEL
CHOLESTEROL: 154 mg/dL (ref 0–200)
HDL: 33 mg/dL — ABNORMAL LOW (ref >40)
LDL Cholesterol: 102 mg/dL — ABNORMAL HIGH (ref 0–99)
Total CHOL/HDL Ratio: 4.7 RATIO
Triglycerides: 94 mg/dL (ref <150)
VLDL: 19 mg/dL (ref 0–40)

## 2016-08-10 LAB — ECHOCARDIOGRAM COMPLETE
HEIGHTINCHES: 60 in
Weight: 2253.98 oz

## 2016-08-10 LAB — CBC
HCT: 34.3 % — ABNORMAL LOW (ref 36.0–46.0)
HEMATOCRIT: 34.1 % — AB (ref 36.0–46.0)
HEMOGLOBIN: 11.5 g/dL — AB (ref 12.0–15.0)
Hemoglobin: 11.4 g/dL — ABNORMAL LOW (ref 12.0–15.0)
MCH: 30.9 pg (ref 26.0–34.0)
MCH: 31.1 pg (ref 26.0–34.0)
MCHC: 33.7 g/dL (ref 30.0–36.0)
MCHC: 33.2 g/dL (ref 30.0–36.0)
MCV: 91.7 fL (ref 78.0–100.0)
MCV: 93.5 fL (ref 78.0–100.0)
PLATELETS: 164 10*3/uL (ref 150–400)
Platelets: 191 10*3/uL (ref 150–400)
RBC: 3.72 MIL/uL — AB (ref 3.87–5.11)
RBC: 3.67 MIL/uL — ABNORMAL LOW (ref 3.87–5.11)
RDW: 13.4 % (ref 11.5–15.5)
RDW: 13.5 % (ref 11.5–15.5)
WBC: 8.1 10*3/uL (ref 4.0–10.5)
WBC: 7.7 10*3/uL (ref 4.0–10.5)

## 2016-08-10 LAB — APTT: APTT: 35 s (ref 24–36)

## 2016-08-10 LAB — TROPONIN I
TROPONIN I: 0.03 ng/mL — AB (ref <0.03)
Troponin I: 32.21 ng/mL (ref <0.03)
Troponin I: >65 ng/mL (ref <0.03)

## 2016-08-10 LAB — POCT ACTIVATED CLOTTING TIME
ACTIVATED CLOTTING TIME: 169 s
Activated Clotting Time: 202 seconds

## 2016-08-10 LAB — MRSA PCR SCREENING: MRSA by PCR: NEGATIVE

## 2016-08-10 MED ORDER — ONDANSETRON HCL 4 MG/2ML IJ SOLN
INTRAMUSCULAR | Status: AC
  Filled 2016-08-10: qty 2

## 2016-08-10 MED ORDER — ONDANSETRON HCL 4 MG/2ML IJ SOLN
4.0000 mg | Freq: Four times a day (QID) | INTRAMUSCULAR | Status: DC | PRN
  Administered 2016-08-10: 4 mg via INTRAVENOUS

## 2016-08-10 MED ORDER — SODIUM CHLORIDE 0.9 % IV SOLN
INTRAVENOUS | Status: DC | PRN
  Administered 2016-08-10: 1 mg/kg/h via INTRAVENOUS

## 2016-08-10 MED ORDER — HYDRALAZINE HCL 20 MG/ML IJ SOLN: 5.0000 mg | INTRAMUSCULAR | Status: AC | PRN

## 2016-08-10 MED ORDER — ATORVASTATIN CALCIUM 80 MG PO TABS
80.0000 mg | ORAL_TABLET | Freq: Every day | ORAL | Status: DC
  Administered 2016-08-10 – 2016-08-13 (×4): 80 mg via ORAL
  Filled 2016-08-10 (×7): qty 1

## 2016-08-10 MED ORDER — FENTANYL CITRATE (PF) 100 MCG/2ML IJ SOLN
INTRAMUSCULAR | Status: AC
  Filled 2016-08-10: qty 2

## 2016-08-10 MED ORDER — LABETALOL HCL 5 MG/ML IV SOLN: 10.0000 mg | INTRAVENOUS | Status: AC | PRN

## 2016-08-10 MED ORDER — CLOPIDOGREL BISULFATE 300 MG PO TABS
ORAL_TABLET | ORAL | Status: AC
  Filled 2016-08-10: qty 1

## 2016-08-10 MED ORDER — ENOXAPARIN SODIUM 40 MG/0.4ML ~~LOC~~ SOLN
30.0000 mg | SUBCUTANEOUS | Status: DC
  Administered 2016-08-12 – 2016-08-14 (×3): 30 mg via SUBCUTANEOUS
  Filled 2016-08-10 (×4): qty 0.4

## 2016-08-10 MED ORDER — HEPARIN (PORCINE) IN NACL 2-0.9 UNIT/ML-% IJ SOLN
INTRAMUSCULAR | Status: AC
  Filled 2016-08-10: qty 500

## 2016-08-10 MED ORDER — ASPIRIN EC 81 MG PO TBEC
81.0000 mg | DELAYED_RELEASE_TABLET | Freq: Every day | ORAL | Status: DC
  Administered 2016-08-10 – 2016-08-14 (×5): 81 mg via ORAL
  Filled 2016-08-10 (×5): qty 1

## 2016-08-10 MED ORDER — FENTANYL CITRATE (PF) 100 MCG/2ML IJ SOLN
INTRAMUSCULAR | Status: DC | PRN
  Administered 2016-08-10: 25 ug via INTRAVENOUS

## 2016-08-10 MED ORDER — ENOXAPARIN SODIUM 40 MG/0.4ML ~~LOC~~ SOLN
40.0000 mg | SUBCUTANEOUS | Status: DC
  Administered 2016-08-10: 40 mg via SUBCUTANEOUS
  Filled 2016-08-10: qty 0.4

## 2016-08-10 MED ORDER — FENTANYL CITRATE (PF) 100 MCG/2ML IJ SOLN
12.5000 ug | Freq: Once | INTRAMUSCULAR | Status: AC
  Administered 2016-08-10: 12.5 ug via INTRAVENOUS

## 2016-08-10 MED ORDER — CLOPIDOGREL BISULFATE 75 MG PO TABS
75.0000 mg | ORAL_TABLET | ORAL | Status: DC
  Administered 2016-08-10 – 2016-08-14 (×3): 75 mg via ORAL
  Filled 2016-08-10 (×5): qty 1

## 2016-08-10 MED ORDER — ATROPINE SULFATE 1 MG/10ML IJ SOSY
PREFILLED_SYRINGE | INTRAMUSCULAR | Status: AC
  Filled 2016-08-10: qty 10

## 2016-08-10 MED ORDER — ACETAMINOPHEN 325 MG PO TABS
650.0000 mg | ORAL_TABLET | ORAL | Status: DC | PRN
  Administered 2016-08-10: 650 mg via ORAL
  Filled 2016-08-10: qty 2

## 2016-08-10 MED ORDER — SODIUM CHLORIDE 0.9% FLUSH: 3.0000 mL | INTRAVENOUS | Status: DC | PRN

## 2016-08-10 MED ORDER — SODIUM CHLORIDE 0.9% FLUSH
3.0000 mL | Freq: Two times a day (BID) | INTRAVENOUS | Status: DC
  Administered 2016-08-10 – 2016-08-14 (×4): 3 mL via INTRAVENOUS

## 2016-08-10 MED ORDER — SODIUM CHLORIDE 0.9 % IV SOLN: INTRAVENOUS | Status: AC

## 2016-08-10 MED ORDER — PANTOPRAZOLE SODIUM 40 MG PO TBEC
40.0000 mg | DELAYED_RELEASE_TABLET | Freq: Every day | ORAL | Status: DC
  Administered 2016-08-10 – 2016-08-14 (×5): 40 mg via ORAL
  Filled 2016-08-10 (×5): qty 1

## 2016-08-10 MED ORDER — BIVALIRUDIN BOLUS VIA INFUSION - CUPID
INTRAVENOUS | Status: DC | PRN
  Administered 2016-08-10: 46.95 mg via INTRAVENOUS

## 2016-08-10 MED ORDER — ONDANSETRON HCL 4 MG/2ML IJ SOLN
INTRAMUSCULAR | Status: DC | PRN
  Administered 2016-08-10: 4 mg via INTRAVENOUS

## 2016-08-10 MED ORDER — METOPROLOL TARTRATE 12.5 MG HALF TABLET
12.5000 mg | ORAL_TABLET | Freq: Two times a day (BID) | ORAL | Status: DC
  Administered 2016-08-10 – 2016-08-14 (×5): 12.5 mg via ORAL
  Filled 2016-08-10 (×7): qty 1

## 2016-08-10 MED ORDER — IOPAMIDOL (ISOVUE-370) INJECTION 76%
INTRAVENOUS | Status: DC | PRN
  Administered 2016-08-10: 85 mL via INTRA_ARTERIAL

## 2016-08-10 MED ORDER — SODIUM CHLORIDE 0.9 % IV SOLN: 250.0000 mL | INTRAVENOUS | Status: DC | PRN

## 2016-08-10 NOTE — Progress Notes (Signed)
Checked on STEMI pt, she and family member sleeping in rm, offered silent prayer. Chaplain available for f/u.    08/10/16 0500  Clinical Encounter Type  Visited With Patient not available  Visit Type Initial;Psychological support;Spiritual support;Social support;Critical Care  Referral From Nurse   Gerrit Heck, Chaplain

## 2016-08-10 NOTE — Progress Notes (Signed)
Dr. Tulare Lions text paged about critical troponin 32.21 from 0.03. Patient sleeping and vital signs stable. Will continue to monitor.

## 2016-08-10 NOTE — Progress Notes (Signed)
   08/10/16 0345  Vitals  BP 126/83  MAP (mmHg) 96  Pulse Rate 74  ECG Heart Rate 73  Resp 16  Oxygen Therapy  SpO2 100 %  Art Line  Arterial Line BP 129/74  Arterial Line MAP (mmHg) 96 mmHg  Right femoral arterial sheath pulled. Manual pressure held for 20 min. Hemostasis achieved. Prior to sheath pull, groin site level 1 with bruising noted. After pressure held, site remains level 1. Small hematoma decompressed from lateral side of puncture site. Patient tolerated well. Vital signs stable through out the procedure. Patient instructed on bedrest and site care. Verbalized and demonstrated understanding. Primary nurse observed site before gauze pressure dressing applied. Will monitor.

## 2016-08-10 NOTE — Progress Notes (Signed)
CARDIAC REHAB PHASE I   PRE:  Rate/Rhythm: 75 afib  BP:   Sitting: 96/76   Standing 99/69     SaO2: 99% RA  MODE:  Ambulation: 190 ft   POST:  Rate/Rhythm: 83 afib  BP:   Sitting: 94/59  Sitting: recheck 93/68     SaO2: 85% c PLB/rest 99%   1140-1227  Pt ambulated 125ft with one person A, gait belt and rolling walker. Pt maintained a somewhat steady gait. Pt did complain of dizziness half way through, was able to continue to room. Pt desaturated for less than a minute to 85% and with rest and PLB quickly increased to 99%. Returned to recliner with VSS and LE elevated. Education completed with patient and daughter. Discussed stent card, antiplatelets, risk factors, activity restrictions, HH diet, exercise guidelines, NTG use, emergency and temperature precautions. Referral sent to [place] [facility].  Gianah Batt D Elekt[employer] Wartman,MS,ACSM-RCEP 08/10/2016 12:15 PM

## 2016-08-10 NOTE — Progress Notes (Signed)
  Echocardiogram 2D Echocardiogram has been performed.  Dawn Cooper 08/10/2016, 1:44 PM

## 2016-08-10 NOTE — Progress Notes (Signed)
Progress Note  Patient Name: Dawn Cooper Date of Encounter: 08/10/2016  Primary Cardiologist: Dr Rosary Lively Burt Knack  Subjective   Denies CP or dyspnea this AM  Inpatient Medications    Scheduled Meds: . aspirin EC  81 mg Oral Daily  . atorvastatin  80 mg Oral q1800  . clopidogrel  75 mg Oral QODAY  . metoprolol tartrate  12.5 mg Oral BID  . pantoprazole  40 mg Oral Daily  . sodium chloride flush  3 mL Intravenous Q12H   Continuous Infusions: . sodium chloride    . sodium chloride     PRN Meds: sodium chloride, acetaminophen, nitroGLYCERIN, ondansetron (ZOFRAN) IV, sodium chloride flush   Vital Signs    Vitals:   08/10/16 0600 08/10/16 0630 08/10/16 0700 08/10/16 0854  BP: (!) 106/93 96/75 117/86   Pulse: 71 75 73   Resp: 14 16 17    Temp:    98 F (36.7 C)  TempSrc:    Oral  SpO2: 97% 98% 99%   Weight:      Height:        Intake/Output Summary (Last 24 hours) at 08/10/16 0925 Last data filed at 08/10/16 0700  Gross per 24 hour  Intake              300 ml  Output              150 ml  Net              150 ml   Filed Weights   08/10/16 0106  Weight: 63.9 kg (140 lb 14 oz)    Telemetry    Atrial fibrillation; rate controlled- Personally Reviewed  Physical Exam   GEN: [place], No acute distress.   Neck: supple Cardiac: irregular Respiratory: Clear to auscultation bilaterally. GI: Soft, nontender, non-distended; right groin with no hematoma; no bruit MS: No edema; No deformity. Neuro:  Nonfocal  Psych: Normal affect   Labs    Chemistry Recent Labs Lab 08/07/16 0919 08/09/16 2325 08/10/16 0424  NA 139 139 136  K 4.5 4.0 4.1  CL 109 111 109  CO2 24 21* 19*  GLUCOSE 115* 161* 179*  BUN 16 18 17   CREATININE 1.80* 1.66* 1.56*  CALCIUM 9.2 9.1 8.8*  PROT  --  6.0*  --   ALBUMIN  --  3.1*  --   AST  --  21  --   ALT  --  10*  --   ALKPHOS  --  58  --   BILITOT  --  0.5  --   GFRNONAA 23* 25* 27*  GFRAA 26* 29* 31*  ANIONGAP 6 7 8      Hematology Recent Labs Lab 08/07/16 0919 08/09/16 2325 08/10/16 0424  WBC 6.1 9.0 8.1  RBC 3.92 3.81* 3.72*  HGB 12.2 12.0 11.5*  HCT 35.7* 35.0* 34.1*  MCV 91.1 91.9 91.7  MCH 31.1 31.5 30.9  MCHC 34.2 34.3 33.7  RDW 13.2 13.6 13.4  PLT 185 193 191    Cardiac Enzymes Recent Labs Lab 08/09/16 2325 08/10/16 0058  TROPONINI 0.03* 32.21*    Recent Labs Lab 08/09/16 2327  TROPIPOC 0.05     Radiology    Dg Chest Port 1 View  Result Date: 08/09/2016 CLINICAL DATA:  Chest pain EXAM: PORTABLE CHEST 1 VIEW COMPARISON:  09/09/2014 FINDINGS: Moderate cardiomegaly. Streaky bibasilar atelectasis or mild infiltrate. Tiny pleural effusion. Aortic atherosclerosis. No pneumothorax. IMPRESSION: Moderate cardiomegaly without edema. Tiny bilateral effusions and streaky bibasilar  atelectasis or infiltrates. Electronically Signed   By: Donavan Foil M.D.   On: 08/09/2016 23:45    Patient Profile     83 y.o. female with severe aortic stenosis (undergoing evaluation for TAVR), permanent atrial fibrillation (not on anticoagulation because of recurrent GI bleeding) admitted with acute anterior myocardial infarction. Now status post PCI of diagonal and LAD.  Assessment & Plan    1 anterior myocardial infarction-patient is now status post PCI of diagonal and LAD. She is not having chest pain at present. Continue aspirin, Plavix, statin and beta blocker. Echocardiogram to assess LV function.  2 permanent atrial fibrillation-continue beta blocker for rate control. She has been felt not to be a candidate for anticoagulation previously because of GI bleeding. Note there is a possible left atrial appendage thrombus on her recent CT. Continue aspirin and Plavix. I have not added anticoagulation given need for dual antiplatelet therapy and GI bleeding previously as outlined. CHADSvasc 7. It may be worth reconsidering anticoagulation after she completes one month of dual antiplatelet therapy (could then  be on plavix and apixaban 2.5 BID long term). I will leave this to [place].  3 severe AS-undergoing evaluation for TAVR; will complete WU following DC.   4 Chronic stage III kidney disease-recheck renal function tomorrow morning. If stable I will resume her home dose of Lasix at that point.    5 Hypertension-blood pressure controlled. Continue present medications.    Signed, Kirk Ruths, MD  08/10/2016, 9:25 AM

## 2016-08-11 ENCOUNTER — Encounter (HOSPITAL_COMMUNITY): Payer: Self-pay

## 2016-08-11 DIAGNOSIS — I350 Nonrheumatic aortic (valve) stenosis: Secondary | ICD-10-CM

## 2016-08-11 DIAGNOSIS — N184 Chronic kidney disease, stage 4 (severe): Secondary | ICD-10-CM

## 2016-08-11 LAB — CBC
HCT: 30.8 % — ABNORMAL LOW (ref 36.0–46.0)
HEMOGLOBIN: 10.2 g/dL — AB (ref 12.0–15.0)
MCH: 30.6 pg (ref 26.0–34.0)
MCHC: 33.1 g/dL (ref 30.0–36.0)
MCV: 92.5 fL (ref 78.0–100.0)
Platelets: 141 10*3/uL — ABNORMAL LOW (ref 150–400)
RBC: 3.33 MIL/uL — ABNORMAL LOW (ref 3.87–5.11)
RDW: 13.5 % (ref 11.5–15.5)
WBC: 8.3 10*3/uL (ref 4.0–10.5)

## 2016-08-11 LAB — BASIC METABOLIC PANEL
ANION GAP: 5 (ref 5–15)
BUN: 16 mg/dL (ref 6–20)
CALCIUM: 8.8 mg/dL — AB (ref 8.9–10.3)
CO2: 23 mmol/L (ref 22–32)
Chloride: 107 mmol/L (ref 101–111)
Creatinine, Ser: 1.61 mg/dL — ABNORMAL HIGH (ref 0.44–1.00)
GFR, EST AFRICAN AMERICAN: 30 mL/min — AB (ref >60)
GFR, EST NON AFRICAN AMERICAN: 26 mL/min — AB (ref >60)
GLUCOSE: 109 mg/dL — AB (ref 65–99)
Potassium: 4.3 mmol/L (ref 3.5–5.1)
SODIUM: 135 mmol/L (ref 135–145)

## 2016-08-11 NOTE — Progress Notes (Signed)
Progress Note  Patient Name: Dawn Cooper Date of Encounter: 08/11/2016  Primary Cardiologist: Dr. Rockey Situ  Subjective   Sitting in chair. Smiling. No shortness of breath, no chest pain. Granddaughter in room.  Inpatient Medications    Scheduled Meds: . aspirin EC  81 mg Oral Daily  . atorvastatin  80 mg Oral q1800  . clopidogrel  75 mg Oral QODAY  . enoxaparin  30 mg Subcutaneous Q24H  . metoprolol tartrate  12.5 mg Oral BID  . pantoprazole  40 mg Oral Daily  . sodium chloride flush  3 mL Intravenous Q12H   Continuous Infusions: . sodium chloride    . sodium chloride     PRN Meds: sodium chloride, acetaminophen, nitroGLYCERIN, ondansetron (ZOFRAN) IV, sodium chloride flush   Vital Signs    Vitals:   08/10/16 2100 08/10/16 2204 08/10/16 2223 08/11/16 0618  BP: 95/68 103/76 124/63 91/61  Pulse: 75 78 83 80  Resp: 18  18 18   Temp:   97.7 F (36.5 C) 98.2 F (36.8 C)  TempSrc:   Oral Oral  SpO2: 100%  100% 97%  Weight:   140 lb 14.4 oz (63.9 kg) 140 lb 3.2 oz (63.6 kg)  Height:   4\' 11"  (1.499 m)     Intake/Output Summary (Last 24 hours) at 08/11/16 9675 Last data filed at 08/10/16 2000  Gross per 24 hour  Intake              120 ml  Output              200 ml  Net              -80 ml   Filed Weights   08/10/16 0106 08/10/16 2223 08/11/16 0618  Weight: 140 lb 14 oz (63.9 kg) 140 lb 14.4 oz (63.9 kg) 140 lb 3.2 oz (63.6 kg)    Telemetry    AFIB 80's - rate controlled - Personally Reviewed  ECG    AFIB, IVCD - Personally Reviewed  Physical Exam   GEN: No acute distress. Elderly  Neck: No JVD Cardiac: RRR, 3/6 SEM, no rubs, or gallops.  Respiratory: Clear to auscultation bilaterally. GI: Soft, nontender, non-distended  MS: No edema; No deformity. Neuro:  Nonfocal  Psych: Normal affect   Labs    Chemistry  Recent Labs Lab 08/09/16 2325 08/10/16 0424 08/10/16 0951 08/11/16 0217  NA 139 136 136 135  K 4.0 4.1 4.8 4.3  CL 111 109 108  107  CO2 21* 19* 20* 23  GLUCOSE 161* 179* 188* 109*  BUN 18 17 16 16   CREATININE 1.66* 1.56* 1.67* 1.61*  CALCIUM 9.1 8.8* 9.1 8.8*  PROT 6.0*  --   --   --   ALBUMIN 3.1*  --   --   --   AST 21  --   --   --   ALT 10*  --   --   --   ALKPHOS 58  --   --   --   BILITOT 0.5  --   --   --   GFRNONAA 25* 27* 25* 26*  GFRAA 29* 31* 29* 30*  ANIONGAP 7 8 8 5      Hematology  Recent Labs Lab 08/10/16 0424 08/10/16 0951 08/11/16 0217  WBC 8.1 7.7 8.3  RBC 3.72* 3.67* 3.33*  HGB 11.5* 11.4* 10.2*  HCT 34.1* 34.3* 30.8*  MCV 91.7 93.5 92.5  MCH 30.9 31.1 30.6  MCHC 33.7 33.2 33.1  RDW 13.4 13.5 13.5  PLT 191 164 141*    Cardiac Enzymes  Recent Labs Lab 08/09/16 2325 08/10/16 0058 08/10/16 0951 08/10/16 1334  TROPONINI 0.03* 32.21* >65.00* >65.00*     Recent Labs Lab 08/09/16 2327  TROPIPOC 0.05     BNPNo results for input(s): BNP, PROBNP in the last 168 hours.   DDimer No results for input(s): DDIMER in the last 168 hours.   Radiology    Dg Chest Port 1 View  Result Date: 08/09/2016 CLINICAL DATA:  Chest pain EXAM: PORTABLE CHEST 1 VIEW COMPARISON:  09/09/2014 FINDINGS: Moderate cardiomegaly. Streaky bibasilar atelectasis or mild infiltrate. Tiny pleural effusion. Aortic atherosclerosis. No pneumothorax. IMPRESSION: Moderate cardiomegaly without edema. Tiny bilateral effusions and streaky bibasilar atelectasis or infiltrates. Electronically Signed   By: Donavan Foil M.D.   On: 08/09/2016 23:45    Cardiac Studies   Cath: 08/09/16 PCI to LAD Echocardiogram 08/10/16 personally viewed - Left ventricle: The cavity size was normal. There was severe   focal basal hypertrophy of the septum. Systolic function was   severely reduced. The estimated ejection fraction was in the   range of 25% to 30%. There is akinesis of the   mid-apicalanteroseptal, anterolateral, and apical myocardium. The   study is not technically sufficient to allow evaluation of LV   diastolic  function. - Aortic valve: Trileaflet; moderately thickened, severely   calcified leaflets. Valve mobility was restricted. There was   moderate to severe stenosis. There was mild regurgitation. Valve   area (VTI): 0.28 cm^2. Valve area (Vmax): 0.25 cm^2. Valve area   (Vmean): 0.27 cm^2. - Aorta: Ascending aortic diameter: 38 mm (S). - Ascending aorta: The ascending aorta was mildly dilated. - Mitral valve: There was mild regurgitation. - Left atrium: The atrium was mildly dilated. - Tricuspid valve: There was moderate regurgitation. - Pulmonary arteries: Systolic pressure was moderately increased.   PA peak pressure: 48 mm Hg (S).  Impressions:  - EF is markedly reduced when compared to prior study.  Patient Profile     83 y.o. female with severe aortic stenosis (undergoing evaluation for TAVR), permanent atrial fibrillation (not on anticoagulation because of recurrent GI bleeding) admitted with acute anterior myocardial infarction. Now status post PCI of diagonal and LAD.  Assessment & Plan    Anterior ST elevation myocardial infarction  - LAD DES placed.  - Markedly reduced ejection fraction in the 30% range. Previously 50%.  - As of note, had right and left heart cath 2 days prior to STEMI and was found to have a severely calcified proximal 90% mid LAD stenosis. Right-sided pressures at that time were normal.  - Troponin 65 or greater  Acute systolic heart failure/dilated cardiomyopathy/ischemic cardiomyopathy  - She is currently on very low-dose beta blocker metoprolol tartrate 12.5 g twice a day. Blood pressure at last check was 91 systolic. We do not have room to increase her beta blocker at this time. We will convert her over to Toprol when able.  - No ACE inhibitor because of chronic kidney disease creatinine 88.38 in a 83 year old.  - Does not appear to be volume overloaded currently.  Permanent atrial fibrillation  - Low-dose beta blocker for rate control.  - Prior GI  bleed, not a candidate for anticoagulation previously.  - There is a possible left atrial appendage thrombus on her recent CT scan.  - Currently on aspirin, Plavix.  - CHADSvasc 7. It may be worth reconsidering anticoagulation after she completes one month of dual  antiplatelet therapy (could then be on plavix and apixaban 2.5 BID long term).   Severe aortic stenosis  - Currently undergoing evaluation for TAVR.   - With her large anterior MI, markedly reduced ejection fraction, plans for valve replacement are currently on hold.  Chronic kidney disease  - Creatinine 1.6, GFR 26  - Creat range 1.4-1.8 through past year.   - With BP low, will hold off home lasix for now. She is comfortable, no SOB.    Signed, Candee Furbish, MD  08/11/2016, 9:22 AM

## 2016-08-12 ENCOUNTER — Encounter (HOSPITAL_COMMUNITY): Payer: Self-pay | Admitting: Cardiovascular Disease

## 2016-08-12 ENCOUNTER — Encounter: Payer: Medicare Other | Admitting: Thoracic Surgery (Cardiothoracic Vascular Surgery)

## 2016-08-12 DIAGNOSIS — I482 Chronic atrial fibrillation: Secondary | ICD-10-CM

## 2016-08-12 DIAGNOSIS — I255 Ischemic cardiomyopathy: Secondary | ICD-10-CM

## 2016-08-12 LAB — POCT ACTIVATED CLOTTING TIME: Activated Clotting Time: 813 seconds

## 2016-08-12 NOTE — Consult Note (Signed)
[facility] CM Primary Care Navigator  08/12/2016  Sariyah Corcino 04/22/1918 [id number]   Met with patient and her daughter Katharine Look) to identify possible discharge needs. Patient reports having "chest pain" that had led to this admission. Patient end[facility] her[facility].   Patientshared using [employer] [facility] and [facility] [place] [facility] [place] [facility].   Patient states that shemanages her medications at home straight out of the containers with her own organizing system (check off list).  She states that her daughter providestransportation to her doctors'appointments.  [employer] transportation benefits discussed with patient and daughter as well.  She lives with daughter who will be assisting with hercare needs at homeas stated.  Anticipated discharge plan is home per patient/ daughter.  Patient and daughter voiced understanding to call primary care provider's office when she returns back home, for a post discharge follow-up appointment within a week or sooner if needs arise.Patient letter (with PCP's contact number) was provided as theirreminder.  Both patient and daughter denies any health management needs or concerns at this time. [facility] care management contact information provided for future needs that she may have.    For questions, please contact:  Dannielle Huh, BSN, RN- [facility] Primary Care Navigator  Telephone: [phone] [place]

## 2016-08-12 NOTE — Progress Notes (Signed)
Progress Note  Patient Name: Dawn Cooper Date of Encounter: 08/12/2016  Primary Cardiologist: Rockey Situ  Subjective   The patient denies chest pain or shortness of breath. She did ambulate with assistance today. She is postop day 3 large anterior STEMI with moderately severe LV dysfunction and severe aortic stenosis.  Inpatient Medications    Scheduled Meds: . aspirin EC  81 mg Oral Daily  . atorvastatin  80 mg Oral q1800  . clopidogrel  75 mg Oral QODAY  . enoxaparin  30 mg Subcutaneous Q24H  . metoprolol tartrate  12.5 mg Oral BID  . pantoprazole  40 mg Oral Daily  . sodium chloride flush  3 mL Intravenous Q12H   Continuous Infusions: . sodium chloride    . sodium chloride     PRN Meds: sodium chloride, acetaminophen, nitroGLYCERIN, ondansetron (ZOFRAN) IV, sodium chloride flush   Vital Signs    Vitals:   08/11/16 1948 08/11/16 2313 08/12/16 0518 08/12/16 0919  BP: 92/64 (!) 93/45 109/75 (!) 109/56  Pulse: 81  88 68  Resp: 19  18   Temp: 99 F (37.2 C)  98.5 F (36.9 C)   TempSrc: Oral  Oral   SpO2: 100%  97%   Weight:   140 lb (63.5 kg)   Height:        Intake/Output Summary (Last 24 hours) at 08/12/16 1143 Last data filed at 08/11/16 1700  Gross per 24 hour  Intake              240 ml  Output                0 ml  Net              240 ml   Filed Weights   08/10/16 2223 08/11/16 0618 08/12/16 0518  Weight: 140 lb 14.4 oz (63.9 kg) 140 lb 3.2 oz (63.6 kg) 140 lb (63.5 kg)    Telemetry    Sinus rhythm, sinus tach with occasional PVCs - Personally Reviewed  ECG    Not performed today - Personally Reviewed  Physical Exam   GEN: No acute distress.   Neck: Mild  JVD Cardiac: RRR, no murmurs, rubs, or gallops.  Respiratory: Clear to auscultation bilaterally. GI: Soft, nontender, non-distended  MS: No edema; No deformity. Neuro:  Nonfocal  Psych: Normal affect  Right femoral arterial puncture site looks well-healed Labs    Chemistry Recent  Labs Lab 08/09/16 2325 08/10/16 0424 08/10/16 0951 08/11/16 0217  NA 139 136 136 135  K 4.0 4.1 4.8 4.3  CL 111 109 108 107  CO2 21* 19* 20* 23  GLUCOSE 161* 179* 188* 109*  BUN 18 17 16 16   CREATININE 1.66* 1.56* 1.67* 1.61*  CALCIUM 9.1 8.8* 9.1 8.8*  PROT 6.0*  --   --   --   ALBUMIN 3.1*  --   --   --   AST 21  --   --   --   ALT 10*  --   --   --   ALKPHOS 58  --   --   --   BILITOT 0.5  --   --   --   GFRNONAA 25* 27* 25* 26*  GFRAA 29* 31* 29* 30*  ANIONGAP 7 8 8 5      Hematology Recent Labs Lab 08/10/16 0424 08/10/16 0951 08/11/16 0217  WBC 8.1 7.7 8.3  RBC 3.72* 3.67* 3.33*  HGB 11.5* 11.4* 10.2*  HCT 34.1* 34.3* 30.8*  MCV 91.7 93.5 92.5  MCH 30.9 31.1 30.6  MCHC 33.7 33.2 33.1  RDW 13.4 13.5 13.5  PLT 191 164 141*    Cardiac Enzymes Recent Labs Lab 08/09/16 2325 08/10/16 0058 08/10/16 0951 08/10/16 1334  TROPONINI 0.03* 32.21* >65.00* >65.00*    Recent Labs Lab 08/09/16 2327  TROPIPOC 0.05     BNPNo results for input(s): BNP, PROBNP in the last 168 hours.   DDimer No results for input(s): DDIMER in the last 168 hours.   Radiology    No results found.  Cardiac Studies   2D Echo  Study Conclusions  - Left ventricle: The cavity size was normal. There was severe   focal basal hypertrophy of the septum. Systolic function was   severely reduced. The estimated ejection fraction was in the   range of 25% to 30%. There is akinesis of the   mid-apicalanteroseptal, anterolateral, and apical myocardium. The   study is not technically sufficient to allow evaluation of LV   diastolic function. - Aortic valve: Trileaflet; moderately thickened, severely   calcified leaflets. Valve mobility was restricted. There was   moderate to severe stenosis. There was mild regurgitation. Valve   area (VTI): 0.28 cm^2. Valve area (Vmax): 0.25 cm^2. Valve area   (Vmean): 0.27 cm^2. - Aorta: Ascending aortic diameter: 38 mm (S). - Ascending aorta: The  ascending aorta was mildly dilated. - Mitral valve: There was mild regurgitation. - Left atrium: The atrium was mildly dilated. - Tricuspid valve: There was moderate regurgitation. - Pulmonary arteries: Systolic pressure was moderately increased.   PA peak pressure: 48 mm Hg (S).  Impressions:  - EF is markedly reduced when compared to prior study.   Cath-  Conclusion     Ost 1st Diag lesion, 70 %stenosed.  A STENT SYNERGY DES 2.5X16 drug eluting stent was successfully placed.  Prox LAD lesion, 100 %stenosed.  Post intervention, there is a 0% residual stenosis.   1. Acute anterolateral STEMI secondary to thrombotic occlusion of known severe mid LAD and diagonal stenosis.  2. Successful PTCA/DES x 1 mid LAD   Recommendations: Will continue DAPT with ASA and Plavix. Start statin. Continue beta blocker. Can repeat echo before discharge to look at LV function. Admit to ICU tonight (2H). Would monitor in 2H for 24 hours before sending to telemetry unit. Continue TAVR workup as outpatient.       Patient Profile     83 y.o. female with a history of severe aortic stenosis and known CAD who is being considered for TAVR by Dr. Burt Knack. She underwent right and left heart cath last Wednesday in anticipation of the T TAVR procedure. She was noted to have calcified proximal LAD disease. She developed chest pain Friday night with anterior ST segment elevation and was brought to the Cath Lab by Dr. Angelena Form who demonstrated an occlusion of the proximal LAD. He did place a Synergy drug-eluting stent restoring antegrade flow with excellent result. Her troponin was greater than 65. Her EF is in the 35% range which is significantly reduced from her normal EF prior to that. She does have moderate renal insufficiency with a creatinine in the 1.6 range which has remained stable. She ambulated with assistance today and denies chest pain or shortness of breath.  Assessment & Plan    1: Anterior  STEMI-postoperative day 3 anterior STEMI treated with PCI and drug-eluting stenting using a Synergy drug-eluting stent of the proximal LAD. Circumflex and RCA are free of significant disease. She is  on dual Therapy including aspirin and Plavix. In addition, she is on beta blocker as well. There is no ACE inhibitor because of her renal insufficiency.  2: Ischemic cardiomyopathy-ejection fraction during this admission is 35%, significantly reduced from her normal ejection for traction devastated by echo late last year. This is as a direct result of her anterior STEMI. I suspect some of this will recover. She is on appropriate medications including beta blocker which will be titrated depending on her blood pressure.  3: Permanent fibrillation-rate controlled on beta blocker. She is not an oral anticoagulant candidate because of prior GI bleeds. She is on dual antiplatelet therapy.  4: Chronic renal insufficiency-serum creatinine 1.61, stable  Given the patient's age and relative frailty I do not think it's prudent to discharge her home today. We'll continue to treat her medically. She'll be ambulatory by cardiac rehabilitation. She may be able to be discharged home tomorrow. She will undergo continued outpatient TAVR  workup.  Angelina Sheriff, MD  08/12/2016, 11:43 AM

## 2016-08-12 NOTE — Progress Notes (Signed)
Pt currently has no IV access. Pt states there is a possibility she will be going home;therefore, she will wait for MD rounding to decide if she needs another IV access or not.

## 2016-08-12 NOTE — Progress Notes (Signed)
CARDIAC REHAB PHASE I   PRE:  Rate/Rhythm: 84 afib  BP:  Supine: 98/70  Sitting:   Standing:    SaO2: 97%RA  MODE:  Ambulation: 200 ft   POST:  Rate/Rhythm: 98  BP:  Supine: 1-2/66  Sitting:   Standing:    SaO2: 98-100%RA  In hall and room 0952-1030 Gave pt CHF booklet and low sodium handouts due to low EF. Discussed watching sodium in diet and importance of daily weights. Pt walked 200 ft after bathroom trip with rolling walker and asst x 1. Has cane and walker at home. Checked sats several times. Back to bed after walk at pt's request. Daughter present for CHF ed.      Graylon Good, RN BSN  08/12/2016 10:25 AM

## 2016-08-13 NOTE — Progress Notes (Signed)
Progress Note  Patient Name: Dawn Cooper Date of Encounter: 08/13/2016  Primary Cardiologist: Rockey Situ  Subjective   The patient denies chest pain or shortness of breath. She did ambulate with assistance today. She is postop day 4 large anterior STEMI with moderately severe LV dysfunction and severe aortic stenosis.  Inpatient Medications    Scheduled Meds: . aspirin EC  81 mg Oral Daily  . atorvastatin  80 mg Oral q1800  . clopidogrel  75 mg Oral QODAY  . enoxaparin  30 mg Subcutaneous Q24H  . metoprolol tartrate  12.5 mg Oral BID  . pantoprazole  40 mg Oral Daily  . sodium chloride flush  3 mL Intravenous Q12H   Continuous Infusions: . sodium chloride    . sodium chloride     PRN Meds: sodium chloride, acetaminophen, nitroGLYCERIN, ondansetron (ZOFRAN) IV, sodium chloride flush   Vital Signs    Vitals:   08/12/16 2120 08/12/16 2121 08/13/16 0500 08/13/16 0943  BP: (!) 82/48 (!) 99/58 91/60 107/68  Pulse: 78 75 75 86  Resp:   17   Temp:   97.9 F (36.6 C)   TempSrc:   Oral   SpO2:   97%   Weight:   139 lb 11.2 oz (63.4 kg)   Height:        Intake/Output Summary (Last 24 hours) at 08/13/16 1002 Last data filed at 08/12/16 1645  Gross per 24 hour  Intake              480 ml  Output                0 ml  Net              480 ml   Filed Weights   08/11/16 0618 08/12/16 0518 08/13/16 0500  Weight: 140 lb 3.2 oz (63.6 kg) 140 lb (63.5 kg) 139 lb 11.2 oz (63.4 kg)    Telemetry    Atrial fibrillation with controlled ventricular response with occasional PVCs - Personally Reviewed  ECG    Not performed today - Personally Reviewed  Physical Exam   GEN: No acute distress.   Neck: Mild  JVD Cardiac: RRR, no murmurs, rubs, or gallops.  Respiratory: Clear to auscultation bilaterally. GI: Soft, nontender, non-distended  MS: No edema; No deformity. Neuro:  Nonfocal  Psych: Normal affect  Right femoral arterial puncture site looks well-healed Labs      Chemistry  Recent Labs Lab 08/09/16 2325 08/10/16 0424 08/10/16 0951 08/11/16 0217  NA 139 136 136 135  K 4.0 4.1 4.8 4.3  CL 111 109 108 107  CO2 21* 19* 20* 23  GLUCOSE 161* 179* 188* 109*  BUN 18 17 16 16   CREATININE 1.66* 1.56* 1.67* 1.61*  CALCIUM 9.1 8.8* 9.1 8.8*  PROT 6.0*  --   --   --   ALBUMIN 3.1*  --   --   --   AST 21  --   --   --   ALT 10*  --   --   --   ALKPHOS 58  --   --   --   BILITOT 0.5  --   --   --   GFRNONAA 25* 27* 25* 26*  GFRAA 29* 31* 29* 30*  ANIONGAP 7 8 8 5      Hematology  Recent Labs Lab 08/10/16 0424 08/10/16 0951 08/11/16 0217  WBC 8.1 7.7 8.3  RBC 3.72* 3.67* 3.33*  HGB 11.5* 11.4* 10.2*  HCT 34.1* 34.3* 30.8*  MCV 91.7 93.5 92.5  MCH 30.9 31.1 30.6  MCHC 33.7 33.2 33.1  RDW 13.4 13.5 13.5  PLT 191 164 141*    Cardiac Enzymes  Recent Labs Lab 08/09/16 2325 08/10/16 0058 08/10/16 0951 08/10/16 1334  TROPONINI 0.03* 32.21* >65.00* >65.00*     Recent Labs Lab 08/09/16 2327  TROPIPOC 0.05     BNPNo results for input(s): BNP, PROBNP in the last 168 hours.   DDimer No results for input(s): DDIMER in the last 168 hours.   Radiology    No results found.  Cardiac Studies   2D Echo  Study Conclusions  - Left ventricle: The cavity size was normal. There was severe   focal basal hypertrophy of the septum. Systolic function was   severely reduced. The estimated ejection fraction was in the   range of 25% to 30%. There is akinesis of the   mid-apicalanteroseptal, anterolateral, and apical myocardium. The   study is not technically sufficient to allow evaluation of LV   diastolic function. - Aortic valve: Trileaflet; moderately thickened, severely   calcified leaflets. Valve mobility was restricted. There was   moderate to severe stenosis. There was mild regurgitation. Valve   area (VTI): 0.28 cm^2. Valve area (Vmax): 0.25 cm^2. Valve area   (Vmean): 0.27 cm^2. - Aorta: Ascending aortic diameter: 38 mm  (S). - Ascending aorta: The ascending aorta was mildly dilated. - Mitral valve: There was mild regurgitation. - Left atrium: The atrium was mildly dilated. - Tricuspid valve: There was moderate regurgitation. - Pulmonary arteries: Systolic pressure was moderately increased.   PA peak pressure: 48 mm Hg (S).  Impressions:  - EF is markedly reduced when compared to prior study.   Cath-  Conclusion     Ost 1st Diag lesion, 70 %stenosed.  A STENT SYNERGY DES 2.5X16 drug eluting stent was successfully placed.  Prox LAD lesion, 100 %stenosed.  Post intervention, there is a 0% residual stenosis.   1. Acute anterolateral STEMI secondary to thrombotic occlusion of known severe mid LAD and diagonal stenosis.  2. Successful PTCA/DES x 1 mid LAD   Recommendations: Will continue DAPT with ASA and Plavix. Start statin. Continue beta blocker. Can repeat echo before discharge to look at LV function. Admit to ICU tonight (2H). Would monitor in 2H for 24 hours before sending to telemetry unit. Continue TAVR workup as outpatient.       Patient Profile     83 y.o. female with a history of severe aortic stenosis and known CAD who is being considered for TAVR by Dr. Burt Knack. She underwent right and left heart cath last Wednesday in anticipation of the T TAVR procedure. She was noted to have calcified proximal LAD disease. She developed chest pain Friday night with anterior ST segment elevation and was brought to the Cath Lab by Dr. Angelena Form who demonstrated an occlusion of the proximal LAD. He did place a Synergy drug-eluting stent restoring antegrade flow with excellent result. Her troponin was greater than 65. Her EF is in the 35% range which is significantly reduced from her normal EF prior to that. She does have moderate renal insufficiency with a creatinine in the 1.6 range which has remained stable. She ambulated with assistance today and denies chest pain or shortness of  breath.  Assessment & Plan    1: Anterior STEMI-postoperative day 4 anterior STEMI treated with PCI and drug-eluting stenting using a Synergy drug-eluting stent of the proximal LAD. Circumflex and RCA  are free of significant disease. She is on dual Therapy including aspirin and Plavix. In addition, she is on beta blocker as well. There is no ACE inhibitor because of her renal insufficiency.  2: Ischemic cardiomyopathy-ejection fraction during this admission is 35%, significantly reduced from her normal ejection for traction devastated by echo late last year. This is as a direct result of her anterior STEMI. I suspect some of this will recover. She is on appropriate medications including beta blocker which will be titrated depending on her blood pressure.  3: Permanent fibrillation-rate controlled on beta blocker. She is not an oral anticoagulant candidate because of prior GI bleeds. She is on dual antiplatelet therapy.  4: Chronic renal insufficiency-serum creatinine 1.61, stable  Given the patient's age and relative frailty I do not think it's prudent to discharge her home today. We'll continue to treat her medically. She'll be ambulatory by cardiac rehabilitation. She may be able to be discharged home tomorrow. She will undergo continued outpatient TAVR  workup.  Angelina Sheriff, MD  08/13/2016, 10:02 AM

## 2016-08-13 NOTE — Progress Notes (Signed)
Pt ambulated in hallway with walker. Tolerated well. No distress noted.

## 2016-08-13 NOTE — Progress Notes (Signed)
CARDIAC REHAB PHASE I   PRE:  Rate/Rhythm: 78 afib  BP:  Supine:   Sitting: 124/71  Standing:    SaO2: 99%RA  MODE:  Ambulation: 150 ft   POST:  Rate/Rhythm: 78  BP:  Supine: 110/68  Sitting:   Standing:    SaO2: 100%RA 1022-1047 Pt walked 150 ft on RA with rolling walker and asst x 1. Pt stated legs a little weak but she felt a little stronger than yesterday. To bed at her request. Daughter stated she is leaving for a little while. Put bed alarm on, gave pt call light and notified NT. Pt knows to call if she needs to get up. To bathroom prior to walk.    Graylon Good, RN BSN  08/13/2016 10:43 AM

## 2016-08-14 ENCOUNTER — Ambulatory Visit: Payer: Medicare Other | Admitting: Cardiovascular Disease

## 2016-08-14 MED ORDER — NITROGLYCERIN 0.4 MG SL SUBL: 0.4000 mg | SUBLINGUAL_TABLET | SUBLINGUAL | 12 refills | Status: DC | PRN

## 2016-08-14 MED ORDER — ATORVASTATIN CALCIUM 80 MG PO TABS: 80.0000 mg | ORAL_TABLET | Freq: Every day | ORAL | 6 refills | Status: DC

## 2016-08-14 NOTE — Progress Notes (Signed)
CARDIAC REHAB PHASE I   PRE:  Rate/Rhythm: 81 afib  BP:  Supine: 79/44  Sitting: 105/65  Standing:    SaO2: 94%RA  MODE:  Ambulation: 150 ft   POST:  Rate/Rhythm: 98 afib  BP:  Supine: 101/65  Sitting:   Standing:    SaO2: 96%RA [id number] Took BP twice lying and systolic in 64H. Sat pt up for few minutes and BP at 105/65. Pt assisted to bathroom and then walked 150 ft with rolling walker. Denied CP or dizziness. To bed with bed alarm. Stated her knees keep her from walking far. Some DOE noted.   Graylon Good, RN BSN  08/14/2016 9:33 AM

## 2016-08-14 NOTE — Discharge Summary (Signed)
Discharge Summary    Patient ID: Dawn Cooper,  MRN: [id number], DOB/AGE: Jan 31, 1919 83 y.o.  Admit date: 08/09/2016 Discharge date: 08/14/2016  Primary Care Provider: Leone [facility] Primary Cardiologist: Dr. Pascal Lux work up: Dr. Burt Knack  Discharge Diagnoses    Principal Problem:   Acute ST elevation myocardial infarction (STEMI) involving left anterior descending (LAD) coronary artery [facility]) Active Problems:   HTN (hypertension)   Aortic valve stenosis, severe   CKD (chronic kidney disease), stage III   History of stroke   Allergies Allergies  Allergen Reactions  . Pradaxa [Dabigatran Etexilate Mesylate] Other (See Comments)    Reaction:  Bleeding   . Clonidine Derivatives Itching and Rash  . Penicillins Itching and Rash    Diagnostic Studies/Procedures    2D Echo  Study Conclusions  - Left ventricle: The cavity size was normal. There was severe focal basal hypertrophy of the septum. Systolic function was severely reduced. The estimated ejection fraction was in the range of 25% to 30%. There is akinesis of the mid-apicalanteroseptal, anterolateral, and apical myocardium. The study is not technically sufficient to allow evaluation of LV diastolic function. - Aortic valve: Trileaflet; moderately thickened, severely calcified leaflets. Valve mobility was restricted. There was moderate to severe stenosis. There was mild regurgitation. Valve area (VTI): 0.28 cm^2. Valve area (Vmax): 0.25 cm^2. Valve area (Vmean): 0.27 cm^2. - Aorta: Ascending aortic diameter: 38 mm (S). - Ascending aorta: The ascending aorta was mildly dilated. - Mitral valve: There was mild regurgitation. - Left atrium: The atrium was mildly dilated. - Tricuspid valve: There was moderate regurgitation. - Pulmonary arteries: Systolic pressure was moderately increased. PA peak pressure: 48 mm Hg (S).  Impressions:  - EF is markedly reduced when compared to  prior study.   Cath-  Conclusion     Ost 1st Diag lesion, 70 %stenosed.  A STENT SYNERGY DES 2.5X16 drug eluting stent was successfully placed.  Prox LAD lesion, 100 %stenosed.  Post intervention, there is a 0% residual stenosis.  1. Acute anterolateral STEMI secondary to thrombotic occlusion of known severe mid LAD and diagonal stenosis.  2. Successful PTCA/DES x 1 mid LAD   Recommendations: Will continue DAPT with ASA and Plavix. Start statin. Continue beta blocker. Can repeat echo before discharge to look at LV function. Admit to ICU tonight (2H). Would monitor in 2H for 24 hours before sending to telemetry unit. Continue TAVR workup as outpatient.       History of Present Illness     Dawn Cooper is a 83 y.o. female with history of severe AS (undergoing TAVR workup), CKD, who presents with CP 08/09/16, found to have STEMI.    Pt had R/L heart cath as part of TAVR 08/07/16 and was found to have severely calcified 90% pLAD stenosis, along with bifurcation disease at D1.  RHC showed normal filling pressures and normal CI.  She had acute onset CP at 10:45 PM, along with emesis.  EMS was called, and initial ECG showed left bundeloid IVCD (chronic) along with new anterolateral STE, and inferior STD.   In the ED she was hypertensive and received SL nitro, but continued to have ongoing CP.  Hospital Course     Consultants: None  1: Anterior STEMI- Emergent Cath Lab by Dr. Angelena Form demonstrated an occlusion of the proximal LAD s/p Synergy drug-eluting stent restoring antegrade flow with excellent result. Her troponin was greater than 65.. Circumflex and RCA are free of significant disease. Ambulated well with cardiac rehab.  -  Continue dual Antiplatelet therapy including aspirin and Plavix. Continue beta blocker and Statin. Will continue to hold home dose of Imdur given soft low BP. No chest pain with activity.   2: Ischemic cardiomyopathy-ejection fraction during this  admission is 35%, significantly reduced from her normal ejection for traction devastated by echo late last year. This is as a direct result of her anterior STEMI.   She is on appropriate medications including beta blocker which will be titrated depending on her blood pressure. Not on ACE/ARB due to CKD. Continue home lasix.   3: Permanent fibrillation-rate controlled on beta blocker. She is not an oral anticoagulant candidate because of prior GI bleeds. She is on dual antiplatelet therapy.  4: Chronic renal insufficiency-serum creatinine 1.61, stable  5. Severe AS: Continue outpatient TVAR work with Dr. Burt Knack and Dr. Roxy Manns. Spoke with Thurmond Butts [facility] co-ordinate) who will arrange outpatient follow up.   6. HLD - 08/09/2016: Cholesterol 154; HDL 33; LDL Cholesterol 102; Triglycerides 94; VLDL 19  - Continue high dose statin. Consider OP f/u labs 6-8 weeks given statin initiation this admission.  The patient has been seen by Dr. Gwenlyn Found  today and deemed ready for discharge home. All follow-up appointments have been scheduled. Discharge medications are listed below.  _____________   Discharge Vitals Blood pressure 91/62, pulse 70, temperature 98.1 F (36.7 C), temperature source Oral, resp. rate 18, height _0  (1.499 m), weight 140 lb 3.2 oz (63.6 kg), SpO2 97 %.  Filed Weights   08/12/16 0518 08/13/16 0500 08/14/16 0429  Weight: 140 lb (63.5 kg) 139 lb 11.2 oz (63.4 kg) 140 lb 3.2 oz (63.6 kg)    Labs & Radiologic Studies     CBC No results for input(s): WBC, NEUTROABS, HGB, HCT, MCV, PLT in the last 72 hours. Basic Metabolic Panel No results for input(s): NA, K, CL, CO2, GLUCOSE, BUN, CREATININE, CALCIUM, MG, PHOS in the last 72 hours. Liver Function Tests No results for input(s): AST, ALT, ALKPHOS, BILITOT, PROT, ALBUMIN in the last 72 hours. No results for input(s): LIPASE, AMYLASE in the last 72 hours. Cardiac Enzymes No results for input(s): CKTOTAL, CKMB, CKMBINDEX, TROPONINI in  the last 72 hours. BNP Invalid input(s): POCBNP D-Dimer No results for input(s): DDIMER in the last 72 hours. Hemoglobin A1C No results for input(s): HGBA1C in the last 72 hours. Fasting Lipid Panel No results for input(s): CHOL, HDL, LDLCALC, TRIG, CHOLHDL, LDLDIRECT in the last 72 hours. Thyroid Function Tests No results for input(s): TSH, T4TOTAL, T3FREE, THYROIDAB in the last 72 hours.  Invalid input(s): FREET3  Ir US Guide Vasc Access Right  Result Date: 07/29/2016 CLINICAL DATA:  Poor venous access. In need of intravenous access for impending contrast-enhanced CT. EXAM: ULTRASOUND GUIDED VENIPUNCTURE BY MD CONTRAST:  None COMPLICATIONS: None immediate TECHNIQUE: The right upper arm was prepped with chlorhexidine in a sterile fashion, and a sterile drape was applied covering the operative field. Local anesthesia was provided with 1% lidocaine. Under direct ultrasound guidance, the right basilic vein was accessed with a micropuncture kit after the overlying soft tissues were anesthetized with 1% lidocaine. An ultrasound image was saved for documentation purposes. The micropuncture sheath easily aspirated and flushed and was secured in place. A dressing was placed. The patient tolerated the procedure well without immediate post procedural complication. IMPRESSION: Successful ultrasound guided placement of a right basilic vein approach micropuncture sheath for temporary venous access. Electronically Signed   By: Sandi Mariscal M.D.   On: 07/29/2016 14:23  Ct Coronary Morph W/cta Cor W/score W/ca W/cm &/or Wo/cm  Addendum Date: 07/29/2016   ADDENDUM REPORT: 07/29/2016 17:39 CLINICAL DATA:  Aortic stenosis EXAM: Cardiac TAVR CT TECHNIQUE: The patient was scanned on a Siemens 128 scanner. A 120 kV retrospective scan was triggered in the ascending thoracic aorta at 129 HU's. Gantry rotation speed was 300 msecs and collimation was .6 mm. No beta blockade or nitro were given. The 3D data set was  reconstructed in 10% intervals of the R-R cycle. Systolic and diastolic phases were analyzed on a dedicated work station using MPR, MIP and VRT modes. The patient received 80 cc of contrast. FINDINGS: Aortic Valve: Trileaflet and severely calcified with restricted motion Aorta: Moderate calcific atherosclerosis with severe tortuosity of the descending thoracic aorta Sinotubular Junction:  28 mm Ascending Thoracic Aorta:  32 mm Aortic Arch:  27 mm Descending Thoracic Aorta:  25 mm Sinus of Valsalva Measurements: Non-coronary:  31 mm Right -coronary:  29 mm Left -coronary:  28 mm Coronary Artery Height above Annulus: Left Main:  10.4 mm Right Coronary:  13.7 mm Virtual Basal Annulus Measurements: Maximum/Minimum Diameter:  23.3 x 17.2 Perimeter:  65.4 mm Area:  354 mm2 Coronary Arteries:  Sufficient height above annulus for deployment Optimum Fluoroscopic Angle for Delivery: LAO 32 Cranial 12 or LAO 27 no cranial caudal IMPRESSION: 1) Tri-leaflet calcified aortic valve with annulus suitable for 23 mm Sapien 3 valve 2) Optimum angle for deployment LAO 32 Cranial 12 or LAO 27 degrees 3) Coronary arteries sufficient height above annulus for deployment 4) Marked tortuosity of descending thoracic aorta Jenkins Rouge Electronically Signed   By: Jenkins Rouge M.D.   On: 07/29/2016 17:39   Result Date: 07/29/2016 EXAM: OVER-READ INTERPRETATION  CT CHEST The following report is an over-read performed by radiologist Dr. Rebekah Chesterfield [facility] on 07/29/2016. This over-read does not include interpretation of cardiac or coronary anatomy or pathology. The coronary calcium score/coronary CTA interpretation by the cardiologist is attached. COMPARISON:  CTA of the chest, abdomen and pelvis 07/23/2016. FINDINGS: Aortic atherosclerosis. Within the visualized portions of the thorax there are no suspicious appearing pulmonary nodules or masses, there is no acute consolidative airspace disease, no pleural effusions, no  pneumothorax and no lymphadenopathy. Visualized portions of the upper abdomen are unremarkable. There are no aggressive appearing lytic or blastic lesions noted in the visualized portions of the skeleton. IMPRESSION: 1. Aortic atherosclerosis. Electronically Signed: By: Vinnie Langton M.D. On: 07/29/2016 17:10   Dg Chest Port 1 View  Result Date: 08/09/2016 CLINICAL DATA:  Chest pain EXAM: PORTABLE CHEST 1 VIEW COMPARISON:  09/09/2014 FINDINGS: Moderate cardiomegaly. Streaky bibasilar atelectasis or mild infiltrate. Tiny pleural effusion. Aortic atherosclerosis. No pneumothorax. IMPRESSION: Moderate cardiomegaly without edema. Tiny bilateral effusions and streaky bibasilar atelectasis or infiltrates. Electronically Signed   By: Donavan Foil M.D.   On: 08/09/2016 23:45   Ct Angio Chest Aorta W &/or Wo Contrast  Result Date: 07/23/2016 CLINICAL DATA:  83 year old female with history of severe aortic stenosis. Preprocedural study prior to potential transcatheter aortic valve replacement (TAVR) procedure. EXAM: CT ANGIOGRAPHY CHEST, ABDOMEN AND PELVIS TECHNIQUE: Multidetector CT imaging through the chest, abdomen and pelvis was performed using the standard protocol during bolus administration of intravenous contrast. Multiplanar reconstructed images and MIPs were obtained and reviewed to evaluate the vascular anatomy. CONTRAST:  80 mL of Isovue 370. COMPARISON:  CT of the chest 01/02/2011. FINDINGS: CTA CHEST FINDINGS Cardiovascular: Heart size is mildly enlarged with left atrial dilatation.  There is no significant pericardial fluid, thickening or pericardial calcification. There is aortic atherosclerosis, as well as atherosclerosis of the great vessels of the mediastinum and the coronary arteries, including calcified atherosclerotic plaque in the left main, left anterior descending, left circumflex and right coronary arteries. Severe thickening calcification of the aortic valve. Mild mitral annular  calcification. Filling defect in the tip of the left atrial appendage concerning for thrombus. Mediastinum/Lymph Nodes: No pathologically enlarged mediastinal or hilar lymph nodes. Small hiatal hernia. No axillary lymphadenopathy. Lungs/Pleura: Patchy areas of mild scarring are noted throughout the lung bases bilaterally. No acute consolidative airspace disease. No pleural effusions. No suspicious appearing pulmonary nodules or masses. Musculoskeletal/Soft Tissues: There are no aggressive appearing lytic or blastic lesions noted in the visualized portions of the skeleton. Old healed fracture of the manubrium incidentally noted. CTA ABDOMEN AND PELVIS FINDINGS Hepatobiliary: The liver has a shrunken appearance and nodular contour, suggestive of underlying cirrhosis. No discrete cystic or solid hepatic lesions. No intra or extrahepatic biliary ductal dilatation. Status post cholecystectomy. Pancreas: No pancreatic mass. No pancreatic ductal dilatation. No pancreatic or peripancreatic fluid or inflammatory changes. Spleen: Calcified granuloma in the spleen incidentally noted. Otherwise, unremarkable. Adrenals/Urinary Tract: Mild bilateral renal atrophy. No suspicious renal lesions. No hydroureteronephrosis. Urinary bladder is normal in appearance. Bilateral adrenal glands are normal in appearance. Stomach/Bowel: The appearance of the stomach is unremarkable. No pathologic dilatation of small bowel or colon. Small duodenal diverticulum extending off the posterior aspect of the second portion of the duodenum incidentally noted. No surrounding inflammatory changes. Numerous colonic diverticulae are noted, particularly throughout the sigmoid colon, without surrounding inflammatory changes to suggest an acute diverticulitis at this time. The appendix is not confidently identified and may be surgically absent. Regardless, there are no inflammatory changes noted adjacent to the cecum to suggest the presence of an acute  appendicitis at this time. Vascular/Lymphatic: Aortic atherosclerosis, with vascular findings and measurements pertinent to potential TAVR procedure, as detailed below. No aneurysm or dissection noted in the abdominal or pelvic vasculature. Celiac axis, superior mesenteric artery and inferior mesenteric artery are all widely patent without hemodynamically significant stenosis. Single renal artery is bilaterally also appear patent without significant stenoses. Reproductive: Uterus and ovaries are atrophic. Other: No significant volume of ascites.  No pneumoperitoneum. Musculoskeletal: There are no aggressive appearing lytic or blastic lesions noted in the visualized portions of the skeleton. Status post ORIF in the right proximal femur. VASCULAR MEASUREMENTS PERTINENT TO TAVR: AORTA: Minimal Aortic Diameter -  12 x 14 mm Severity of Aortic Calcification -  moderate to severe RIGHT PELVIS: Right Common Iliac Artery - Minimal Diameter - 9.7 x 9.6 mm Tortuosity - mild Calcification - mild Right External Iliac Artery - Minimal Diameter - 5.4 x 5.7 mm Tortuosity - mild to moderate Calcification - none Right Common Femoral Artery - Minimal Diameter - 6.2 x 6.1 mm Tortuosity - mild Calcification - none LEFT PELVIS: Left Common Iliac Artery - Minimal Diameter - 6.7 x 7.7 mm Tortuosity - mild Calcification - mild Left External Iliac Artery - Minimal Diameter - 6.1 x 6.6 mm Tortuosity - moderate Calcification - none Left Common Femoral Artery - Minimal Diameter - 6.3 x 6.6 mm Tortuosity - mild Calcification - none Review of the MIP images confirms the above findings. IMPRESSION: 1. Vascular findings and measurements pertinent to potential TAVR procedure, as detailed above. This patient appears to have suitable pelvic arterial access bilaterally, more optimal on the left secondary to a slightly larger size  of the left external iliac artery. 2. Severe thickening calcification of the aortic valve, compatible with the reported  clinical history of severe aortic stenosis. 3. Filling defect in the tip of the left atrial appendage concerning for left atrial appendage thrombus. While there is the possibility that this could be so-called " "seudothrombus " " " to incomplete mixing of contrast material, if left atrial appendage thrombus is present, this places the patient at risk for potential systemic embolization. Correlation with transesophageal echocardiography is suggested if not recently performed. 4. Aortic atherosclerosis, in addition to left main and 3 vessel coronary artery disease. Please note that although the presence of coronary artery calcium documents the presence of coronary artery disease, the severity of this disease and any potential stenosis cannot be assessed on this non-gated CT examination. Assessment for potential risk factor modification, dietary therapy or pharmacologic therapy may be warranted, if clinically indicated. 5. Cardiomegaly with mild left atrial dilatation. 6. Morphologic changes in the liver suggestive of underlying cirrhosis. 7. Colonic diverticulosis without evidence of acute diverticulitis at this time. 8. Additional incidental findings, as above. These results will be called to the ordering clinician or representative by the Radiologist Assistant, and communication documented in the PACS or zVision Dashboard. Electronically Signed   By: Vinnie Langton M.D.   On: 07/23/2016 13:54   Ir Radiology Peripheral Guided Iv Start  Result Date: 07/29/2016 CLINICAL DATA:  Poor venous access. In need of intravenous access for impending contrast-enhanced CT. EXAM: ULTRASOUND GUIDED VENIPUNCTURE BY MD CONTRAST:  None COMPLICATIONS: None immediate TECHNIQUE: The right upper arm was prepped with chlorhexidine in a sterile fashion, and a sterile drape was applied covering the operative field. Local anesthesia was provided with 1% lidocaine. Under direct ultrasound guidance, the right basilic vein was accessed with  a micropuncture kit after the overlying soft tissues were anesthetized with 1% lidocaine. An ultrasound image was saved for documentation purposes. The micropuncture sheath easily aspirated and flushed and was secured in place. A dressing was placed. The patient tolerated the procedure well without immediate post procedural complication. IMPRESSION: Successful ultrasound guided placement of a right basilic vein approach micropuncture sheath for temporary venous access. Electronically Signed   By: Sandi Mariscal M.D.   On: 07/29/2016 14:23   Ct Angio Abd/pel W/ And/or W/o  Result Date: 07/23/2016 CLINICAL DATA:  83 year old female with history of severe aortic stenosis. Preprocedural study prior to potential transcatheter aortic valve replacement (TAVR) procedure. EXAM: CT ANGIOGRAPHY CHEST, ABDOMEN AND PELVIS TECHNIQUE: Multidetector CT imaging through the chest, abdomen and pelvis was performed using the standard protocol during bolus administration of intravenous contrast. Multiplanar reconstructed images and MIPs were obtained and reviewed to evaluate the vascular anatomy. CONTRAST:  80 mL of Isovue 370. COMPARISON:  CT of the chest 01/02/2011. FINDINGS: CTA CHEST FINDINGS Cardiovascular: Heart size is mildly enlarged with left atrial dilatation. There is no significant pericardial fluid, thickening or pericardial calcification. There is aortic atherosclerosis, as well as atherosclerosis of the great vessels of the mediastinum and the coronary arteries, including calcified atherosclerotic plaque in the left main, left anterior descending, left circumflex and right coronary arteries. Severe thickening calcification of the aortic valve. Mild mitral annular calcification. Filling defect in the tip of the left atrial appendage concerning for thrombus. Mediastinum/Lymph Nodes: No pathologically enlarged mediastinal or hilar lymph nodes. Small hiatal hernia. No axillary lymphadenopathy. Lungs/Pleura: Patchy areas of  mild scarring are noted throughout the lung bases bilaterally. No acute consolidative airspace disease. No pleural effusions.  No suspicious appearing pulmonary nodules or masses. Musculoskeletal/Soft Tissues: There are no aggressive appearing lytic or blastic lesions noted in the visualized portions of the skeleton. Old healed fracture of the manubrium incidentally noted. CTA ABDOMEN AND PELVIS FINDINGS Hepatobiliary: The liver has a shrunken appearance and nodular contour, suggestive of underlying cirrhosis. No discrete cystic or solid hepatic lesions. No intra or extrahepatic biliary ductal dilatation. Status post cholecystectomy. Pancreas: No pancreatic mass. No pancreatic ductal dilatation. No pancreatic or peripancreatic fluid or inflammatory changes. Spleen: Calcified granuloma in the spleen incidentally noted. Otherwise, unremarkable. Adrenals/Urinary Tract: Mild bilateral renal atrophy. No suspicious renal lesions. No hydroureteronephrosis. Urinary bladder is normal in appearance. Bilateral adrenal glands are normal in appearance. Stomach/Bowel: The appearance of the stomach is unremarkable. No pathologic dilatation of small bowel or colon. Small duodenal diverticulum extending off the posterior aspect of the second portion of the duodenum incidentally noted. No surrounding inflammatory changes. Numerous colonic diverticulae are noted, particularly throughout the sigmoid colon, without surrounding inflammatory changes to suggest an acute diverticulitis at this time. The appendix is not confidently identified and may be surgically absent. Regardless, there are no inflammatory changes noted adjacent to the cecum to suggest the presence of an acute appendicitis at this time. Vascular/Lymphatic: Aortic atherosclerosis, with vascular findings and measurements pertinent to potential TAVR procedure, as detailed below. No aneurysm or dissection noted in the abdominal or pelvic vasculature. Celiac axis, superior  mesenteric artery and inferior mesenteric artery are all widely patent without hemodynamically significant stenosis. Single renal artery is bilaterally also appear patent without significant stenoses. Reproductive: Uterus and ovaries are atrophic. Other: No significant volume of ascites.  No pneumoperitoneum. Musculoskeletal: There are no aggressive appearing lytic or blastic lesions noted in the visualized portions of the skeleton. Status post ORIF in the right proximal femur. VASCULAR MEASUREMENTS PERTINENT TO TAVR: AORTA: Minimal Aortic Diameter -  12 x 14 mm Severity of Aortic Calcification -  moderate to severe RIGHT PELVIS: Right Common Iliac Artery - Minimal Diameter - 9.7 x 9.6 mm Tortuosity - mild Calcification - mild Right External Iliac Artery - Minimal Diameter - 5.4 x 5.7 mm Tortuosity - mild to moderate Calcification - none Right Common Femoral Artery - Minimal Diameter - 6.2 x 6.1 mm Tortuosity - mild Calcification - none LEFT PELVIS: Left Common Iliac Artery - Minimal Diameter - 6.7 x 7.7 mm Tortuosity - mild Calcification - mild Left External Iliac Artery - Minimal Diameter - 6.1 x 6.6 mm Tortuosity - moderate Calcification - none Left Common Femoral Artery - Minimal Diameter - 6.3 x 6.6 mm Tortuosity - mild Calcification - none Review of the MIP images confirms the above findings. IMPRESSION: 1. Vascular findings and measurements pertinent to potential TAVR procedure, as detailed above. This patient appears to have suitable pelvic arterial access bilaterally, more optimal on the left secondary to a slightly larger size of the left external iliac artery. 2. Severe thickening calcification of the aortic valve, compatible with the reported clinical history of severe aortic stenosis. 3. Filling defect in the tip of the left atrial appendage concerning for left atrial appendage thrombus. While there is the possibility that this could be so-called " "seudothrombus " " " to incomplete mixing of contrast  material, if left atrial appendage thrombus is present, this places the patient at risk for potential systemic embolization. Correlation with transesophageal echocardiography is suggested if not recently performed. 4. Aortic atherosclerosis, in addition to left main and 3 vessel coronary artery disease. Please note that although  the presence of coronary artery calcium documents the presence of coronary artery disease, the severity of this disease and any potential stenosis cannot be assessed on this non-gated CT examination. Assessment for potential risk factor modification, dietary therapy or pharmacologic therapy may be warranted, if clinically indicated. 5. Cardiomegaly with mild left atrial dilatation. 6. Morphologic changes in the liver suggestive of underlying cirrhosis. 7. Colonic diverticulosis without evidence of acute diverticulitis at this time. 8. Additional incidental findings, as above. These results will be called to the ordering clinician or representative by the Radiologist Assistant, and communication documented in the PACS or zVision Dashboard. Electronically Signed   By: Vinnie Langton M.D.   On: 07/23/2016 13:54    Disposition   Pt is being discharged home today in good condition.  Follow-up Plans & Appointments    Follow-up Information    Minna Merritts, MD. Go on 08/30/2016.   Specialty:  Cardiology Why:  _0 :00 pm for post hospital. Office will call if any sooner appointment available Contact information: [place] [address] [phone]        Rexene Alberts, MD Follow up.   Specialty:  Cardiothoracic Surgery Why:  office will call with [facility] follow up Contact information: [place] [address] [phone]          Discharge Instructions    Amb Referral to Cardiac Rehabilitation    Complete by:  As directed    Diagnosis:   Coronary Stents STEMI     Diet - low sodium heart healthy     Complete by:  As directed    Discharge instructions    Complete by:  As directed    No driving for 2 weeks. No lifting over 10 lbs for 4 weeks. No sexual activity for 4 weeks.  Keep procedure site clean & dry. If you notice increased pain, swelling, bleeding or pus, call/return!  You may shower, but no soaking baths/hot tubs/pools for 1 week.   Increase activity slowly    Complete by:  As directed       Discharge Medications   Current Discharge Medication List    START taking these medications   Details  atorvastatin (LIPITOR) 80 MG tablet Take 1 tablet (80 mg total) by mouth daily at 6 PM. Qty: 30 tablet, Refills: 6    nitroGLYCERIN (NITROSTAT) 0.4 MG SL tablet Place 1 tablet (0.4 mg total) under the tongue every 5 (five) minutes x 3 doses as needed for chest pain. Qty: 25 tablet, Refills: 12      CONTINUE these medications which have NOT CHANGED   Details  aspirin EC 81 MG tablet Take 1 tablet (81 mg total) by mouth daily. Qty: 30 tablet, Refills: 0    clopidogrel (PLAVIX) 75 MG tablet Take 75 mg by mouth every other day.    furosemide (LASIX) 20 MG tablet Take 1 tablet (20 mg total) by mouth every other day. Qty: 45 tablet, Refills: 3   Associated Diagnoses: Edema, unspecified type    metoprolol tartrate (LOPRESSOR) 25 MG tablet Take 12.5 mg by mouth 2 (two) times daily.    pantoprazole (PROTONIX) 40 MG tablet TAKE 1 TABLET EVERY DAY Qty: 90 tablet, Refills: 3    potassium chloride (K-DUR,KLOR-CON) 10 MEQ tablet Take 1 tablet (10 mEq total) by mouth every other day. Qty: 135 tablet, Refills: 3      STOP taking these medications     isosorbide mononitrate (IMDUR) 30  MG 24 hr tablet          Aspirin prescribed at discharge?  Yes High Intensity Statin Prescribed? (Lipitor 40-11m or Crestor 20-469m: Yes Beta Blocker Prescribed? Yes For EF 45% or less, Was ACEI/ARB Prescribed? BP soft ADP Receptor Inhibitor Prescribed? (i.e. Plavix etc.-Includes Medically Managed  Patients): Yes For EF <40%, Aldosterone Inhibitor Prescribed? BP soft  Was EF assessed during THIS hospitalization? Yes Was Cardiac Rehab II ordered? (Included Medically managed Patients): Yes   Outstanding Labs/Studies   Consider OP f/u labs 6-8 weeks given statin initiation this admission. BMET during follow up  Duration of Discharge Encounter   Greater than 30 minutes including physician time.  Signed, Bhagat,Bhavinkumar PA-C 08/14/2016, 11:22 AM  Agree with note by ViRobbie LisA-C  Recent Ant STEMI s/p acute intervention. EF 35%. Crit AS being w/u for TAVR. OK for DC home. ROV witrh MLP TOC 7, then Dr CoBurt KnackJoLorretta HarpM.D., FAMorrisFATowne Centre Surgery Center LLCFALaverta BaltimoreSShively[address]Su[place]live Branch[place]  [phone]/23/2018 1:18 PM

## 2016-08-14 NOTE — Care Management Important Message (Signed)
Important Message  Patient Details  Name: Dawn Cooper MRN: [id number] Date of Birth: 12/31/1918   Medicare Important Message Given:  Yes    [name] 08/14/2016, 10:10 AM

## 2016-08-14 NOTE — Progress Notes (Signed)
Progress Note  Patient Name: Dawn Cooper Date of Encounter: 08/14/2016  Primary Cardiologist: Rockey Situ  Subjective   The patient denies chest pain or shortness of breath. She did ambulate with assistance today. She is postop day 5 large anterior STEMI with moderately severe LV dysfunction and severe aortic stenosis. She denies chest pain but does have some dyspnea on exertion.  Inpatient Medications    Scheduled Meds: . aspirin EC  81 mg Oral Daily  . atorvastatin  80 mg Oral q1800  . clopidogrel  75 mg Oral QODAY  . enoxaparin  30 mg Subcutaneous Q24H  . metoprolol tartrate  12.5 mg Oral BID  . pantoprazole  40 mg Oral Daily  . sodium chloride flush  3 mL Intravenous Q12H   Continuous Infusions: . sodium chloride    . sodium chloride     PRN Meds: sodium chloride, acetaminophen, nitroGLYCERIN, ondansetron (ZOFRAN) IV, sodium chloride flush   Vital Signs    Vitals:   08/13/16 2023 08/13/16 2303 08/13/16 2304 08/14/16 0429  BP: 100/60 (!) 83/56 100/66 91/62  Pulse: 76 80 85 70  Resp: 18   18  Temp: 98.2 F (36.8 C)   98.1 F (36.7 C)  TempSrc: Oral   Oral  SpO2: 96%   97%  Weight:    140 lb 3.2 oz (63.6 kg)  Height:        Intake/Output Summary (Last 24 hours) at 08/14/16 1037 Last data filed at 08/13/16 2304  Gross per 24 hour  Intake              480 ml  Output                0 ml  Net              480 ml   Filed Weights   08/12/16 0518 08/13/16 0500 08/14/16 0429  Weight: 140 lb (63.5 kg) 139 lb 11.2 oz (63.4 kg) 140 lb 3.2 oz (63.6 kg)    Telemetry    Atrial fibrillation with controlled ventricular response with occasional PVCs - Personally Reviewed  ECG    Not performed today - Personally Reviewed  Physical Exam   GEN: No acute distress.   Neck: Mild  JVD Cardiac: RRR, no murmurs, rubs, or gallops.  Respiratory: Clear to auscultation bilaterally. GI: Soft, nontender, non-distended  MS: No edema; No deformity. Neuro:  Nonfocal  Psych:  Normal affect  Right femoral arterial puncture site looks well-healed Labs    Chemistry  Recent Labs Lab 08/09/16 2325 08/10/16 0424 08/10/16 0951 08/11/16 0217  NA 139 136 136 135  K 4.0 4.1 4.8 4.3  CL 111 109 108 107  CO2 21* 19* 20* 23  GLUCOSE 161* 179* 188* 109*  BUN 18 17 16 16   CREATININE 1.66* 1.56* 1.67* 1.61*  CALCIUM 9.1 8.8* 9.1 8.8*  PROT 6.0*  --   --   --   ALBUMIN 3.1*  --   --   --   AST 21  --   --   --   ALT 10*  --   --   --   ALKPHOS 58  --   --   --   BILITOT 0.5  --   --   --   GFRNONAA 25* 27* 25* 26*  GFRAA 29* 31* 29* 30*  ANIONGAP 7 8 8 5      Hematology  Recent Labs Lab 08/10/16 0424 08/10/16 0951 08/11/16 0217  WBC 8.1 7.7  8.3  RBC 3.72* 3.67* 3.33*  HGB 11.5* 11.4* 10.2*  HCT 34.1* 34.3* 30.8*  MCV 91.7 93.5 92.5  MCH 30.9 31.1 30.6  MCHC 33.7 33.2 33.1  RDW 13.4 13.5 13.5  PLT 191 164 141*    Cardiac Enzymes  Recent Labs Lab 08/09/16 2325 08/10/16 0058 08/10/16 0951 08/10/16 1334  TROPONINI 0.03* 32.21* >65.00* >65.00*     Recent Labs Lab 08/09/16 2327  TROPIPOC 0.05     BNPNo results for input(s): BNP, PROBNP in the last 168 hours.   DDimer No results for input(s): DDIMER in the last 168 hours.   Radiology    No results found.  Cardiac Studies   2D Echo  Study Conclusions  - Left ventricle: The cavity size was normal. There was severe   focal basal hypertrophy of the septum. Systolic function was   severely reduced. The estimated ejection fraction was in the   range of 25% to 30%. There is akinesis of the   mid-apicalanteroseptal, anterolateral, and apical myocardium. The   study is not technically sufficient to allow evaluation of LV   diastolic function. - Aortic valve: Trileaflet; moderately thickened, severely   calcified leaflets. Valve mobility was restricted. There was   moderate to severe stenosis. There was mild regurgitation. Valve   area (VTI): 0.28 cm^2. Valve area (Vmax): 0.25 cm^2.  Valve area   (Vmean): 0.27 cm^2. - Aorta: Ascending aortic diameter: 38 mm (S). - Ascending aorta: The ascending aorta was mildly dilated. - Mitral valve: There was mild regurgitation. - Left atrium: The atrium was mildly dilated. - Tricuspid valve: There was moderate regurgitation. - Pulmonary arteries: Systolic pressure was moderately increased.   PA peak pressure: 48 mm Hg (S).  Impressions:  - EF is markedly reduced when compared to prior study.   Cath-  Conclusion     Ost 1st Diag lesion, 70 %stenosed.  A STENT SYNERGY DES 2.5X16 drug eluting stent was successfully placed.  Prox LAD lesion, 100 %stenosed.  Post intervention, there is a 0% residual stenosis.   1. Acute anterolateral STEMI secondary to thrombotic occlusion of known severe mid LAD and diagonal stenosis.  2. Successful PTCA/DES x 1 mid LAD   Recommendations: Will continue DAPT with ASA and Plavix. Start statin. Continue beta blocker. Can repeat echo before discharge to look at LV function. Admit to ICU tonight (2H). Would monitor in 2H for 24 hours before sending to telemetry unit. Continue TAVR workup as outpatient.       Patient Profile     83 y.o. female with a history of severe aortic stenosis and known CAD who is being considered for TAVR by Dr. Burt Knack. She underwent right and left heart cath last Wednesday in anticipation of the T TAVR procedure. She was noted to have calcified proximal LAD disease. She developed chest pain Friday night with anterior ST segment elevation and was brought to the Cath Lab by Dr. Angelena Form who demonstrated an occlusion of the proximal LAD. He did place a Synergy drug-eluting stent restoring antegrade flow with excellent result. Her troponin was greater than 65. Her EF is in the 35% range which is significantly reduced from her normal EF prior to that. She does have moderate renal insufficiency with a creatinine in the 1.6 range which has remained stable. She ambulated  with assistance today and denies chest pain or shortness of breath.  Assessment & Plan    1: Anterior STEMI-postoperative day 5anterior STEMI treated with PCI and drug-eluting stenting using  a Synergy drug-eluting stent of the proximal LAD. Circumflex and RCA are free of significant disease. She is on dual Antiplatelet therapy including aspirin and Plavix. In addition, she is on beta blocker as well. There is no ACE inhibitor because of her renal insufficiency.  2: Ischemic cardiomyopathy-ejection fraction during this admission is 35%, significantly reduced from her normal ejection for traction devastated by echo late last year. This is as a direct result of her anterior STEMI. I suspect some of this will recover. She is on appropriate medications including beta blocker which will be titrated depending on her blood pressure.  3: Permanent fibrillation-rate controlled on beta blocker. She is not an oral anticoagulant candidate because of prior GI bleeds. She is on dual antiplatelet therapy.  4: Chronic renal insufficiency-serum creatinine 1.61, stable  Ms. Rolston appears stronger today. She is ambulating with cardiac rehabilitation. Her vital signs are stable. I feel comfortable discharging her home. She'll need a TLC 7 appointment with a mid-level provider and ultimately a return office visit to see Dr. Rockey Situ and Dr. Burt Knack for further evaluation of her planned T aVR procedure.  Angelina Sheriff, MD  08/14/2016, 10:37 AM

## 2016-08-14 NOTE — Care Management Note (Signed)
Case Management Note Marvetta Gibbons RN, BSN Unit 2W-Case Manager [phone]  Patient Details  Name: Deardra Hinkley MRN: [id number] Date of Birth: [birth year]  Subjective/Objective:  Pt admitted with STEMI, s/p stent- has been placed on ASA/Plavix                  Action/Plan: PTA pt lived at home with daughter- plan to return home- no CM needs noted for discharge  Expected Discharge Date:  08/14/16               Expected Discharge Plan:  Home/Self Care  In-House Referral:     Discharge planning Services  CM Consult  Post Acute Care Choice:  NA Choice offered to:  NA  DME Arranged:    DME Agency:     HH Arranged:    [place] Agency:     Status of Service:  Completed, signed off  If discussed at [place] of Stay Meetings, dates discussed:    Discharge Disposition: home/self care  Additional Comments:  Dawayne Patricia, RN 08/14/2016, 11:43 AM

## 2016-08-16 ENCOUNTER — Telehealth: Payer: Self-pay | Admitting: Family Medicine

## 2016-08-16 NOTE — Telephone Encounter (Addendum)
Transition Care Management Follow-up Telephone Call  How have you been since you were released from the hospital? Feeling better since hospital stay.   Do you understand why you were in the hospital? yes   Do you understand the discharge instrcutions? yes  Items Reviewed:  Medications reviewed: yes  Allergies reviewed: yes  Dietary changes reviewed: yes  Referrals reviewed:yes   Functional Questionnaire:   Activities of Daily Living (ADLs):   She states they are independent in the following: Independent In all ADLs States they require assistance with the following: No assist needed at thistime.   Any transportation issues/concerns?: no   Any patient concerns? no   Confirmed importance and date/time of follow-up visits scheduled: yes   Confirmed with patient if condition begins to worsen call PCP or go to the ER.  Patient was given the Call-a-Nurse line [phone]: yes

## 2016-08-20 ENCOUNTER — Ambulatory Visit: Payer: Medicare Other

## 2016-08-26 ENCOUNTER — Encounter: Payer: Self-pay | Admitting: Family Medicine

## 2016-08-26 ENCOUNTER — Ambulatory Visit (INDEPENDENT_AMBULATORY_CARE_PROVIDER_SITE_OTHER): Payer: Medicare Other | Admitting: Family Medicine

## 2016-08-26 VITALS — BP 138/90 | HR 76 | Temp 97.9°F | Wt 137.1 lb

## 2016-08-26 DIAGNOSIS — K219 Gastro-esophageal reflux disease without esophagitis: Secondary | ICD-10-CM

## 2016-08-26 DIAGNOSIS — I219 Acute myocardial infarction, unspecified: Secondary | ICD-10-CM

## 2016-08-26 DIAGNOSIS — D510 Vitamin B12 deficiency anemia due to intrinsic factor deficiency: Secondary | ICD-10-CM

## 2016-08-26 DIAGNOSIS — I252 Old myocardial infarction: Secondary | ICD-10-CM

## 2016-08-26 DIAGNOSIS — E538 Deficiency of other specified B group vitamins: Secondary | ICD-10-CM | POA: Diagnosis not present

## 2016-08-26 MED ORDER — CYANOCOBALAMIN 1000 MCG/ML IJ SOLN
1000.0000 ug | Freq: Once | INTRAMUSCULAR | Status: AC
  Administered 2016-08-26: 1000 ug via INTRAMUSCULAR

## 2016-08-26 NOTE — Assessment & Plan Note (Signed)
B12 injection today 

## 2016-08-26 NOTE — Assessment & Plan Note (Signed)
Seems to be well controlled on Protonix. Continue to monitor.

## 2016-08-26 NOTE — Progress Notes (Signed)
  Tommi Rumps, MD Phone: [phone]  Dawn Cooper is a 83 y.o. female who presents today for hospital follow-up.  Patient was hospitalized from 08/09/16-08/14/16 for a STEMI. She underwent cardiac catheterization with stent placement. She had resolution of her chest pain following this. They sent in Lipitor for her to start on though she has not started this yet as she has not received it from her pharmacy. She's currently taking aspirin and Plavix. Also on metoprolol. Takes Lasix every other day. Occasionally has a small amount of swelling. No orthopnea or PND. She did have an echo that revealed reduced ejection fraction from prior. EF 25-30%. She reports no recurrence of chest pain. She notes feeling possibly a small amount of shortness of breath only in the morning after she sits up from sleeping. Resolves quickly. She has no exertional shortness of breath or shortness of breath throughout the rest of the day. No orthopnea or PND. She notes no abdominal pain. She does note bruising over the left side of her abdomen where she reports they gave her several shots. She has follow-up with cardiology later this week. Does note occasional cough when feeling as though her throat has been tickled by something she swallows. She notes no reflux symptoms. She is on Protonix. Medications reviewed.  PMH: nonsmoker.   ROS see history of present illness  Objective  Physical Exam Vitals:   08/26/16 1115  BP: 138/90  Pulse: 76  Temp: 97.9 F (36.6 C)    BP Readings from Last 3 Encounters:  08/26/16 138/90  08/14/16 91/62  08/07/16 (!) 143/77   Wt Readings from Last 3 Encounters:  08/26/16 137 lb 1.9 oz (62.2 kg)  08/14/16 140 lb 3.2 oz (63.6 kg)  08/07/16 138 lb (62.6 kg)    Physical Exam  Constitutional: No distress.  Cardiovascular: Normal rate and regular rhythm.   Murmur (2/6 systolic) heard. Pulmonary/Chest: Effort normal and breath sounds normal.  Abdominal: Soft. Bowel sounds  are normal. She exhibits no distension. There is no rebound and no guarding.  Bruising over left lower abdomen, slight bruising suprapubically as well, minimal tenderness in areas of bruising, well-healed right groin, no evidence of hematomas in any of these areas  Musculoskeletal: She exhibits no edema.  Neurological: She is alert.  Skin: Skin is warm and dry. She is not diaphoretic.     Assessment/Plan: Please see individual problem list.  History of ST elevation myocardial infarction Patient recently hospitalized for STEMI. Underwent stent placement. Symptoms have improved. She has not had any recurrent chest pain. Minimal described shortness of breath only in the morning after sitting up in bed. No other shortness of breath. Discussed having her start on the Lipitor. Her daughter called while she was in the office and confirmed that she will be receiving this today. Discussed appropriate use of nitroglycerin if needed. She'll continue aspirin and Plavix. Continue metoprolol. She'll monitor for recurrence of symptoms and seek medical attention if this does occur. Discussed that bruising take some time to improve. Advised to monitor this. She's given return precautions.  GERD (gastroesophageal reflux disease) Seems to be well controlled on Protonix. Continue to monitor.  Pernicious anemia B12 injection today.   Tommi Rumps, MD [place]

## 2016-08-26 NOTE — Patient Instructions (Signed)
Nice to see you. I'm glad you're doing better from your hospitalization. I would start on the Lipitor. You should continue aspirin and Plavix. Please continue the metoprolol. If you develop recurrence of chest pain or shortness of breath or any new or changing symptoms please seek medical attention immediately.

## 2016-08-26 NOTE — Assessment & Plan Note (Signed)
Patient recently hospitalized for STEMI. Underwent stent placement. Symptoms have improved. She has not had any recurrent chest pain. Minimal described shortness of breath only in the morning after sitting up in bed. No other shortness of breath. Discussed having her start on the Lipitor. Her daughter called while she was in the office and confirmed that she will be receiving this today. Discussed appropriate use of nitroglycerin if needed. She'll continue aspirin and Plavix. Continue metoprolol. She'll monitor for recurrence of symptoms and seek medical attention if this does occur. Discussed that bruising take some time to improve. Advised to monitor this. She's given return precautions.

## 2016-08-30 ENCOUNTER — Ambulatory Visit (INDEPENDENT_AMBULATORY_CARE_PROVIDER_SITE_OTHER): Payer: Medicare Other | Admitting: Cardiovascular Disease

## 2016-08-30 ENCOUNTER — Encounter: Payer: Self-pay | Admitting: Cardiovascular Disease

## 2016-08-30 VITALS — BP 124/84 | HR 78 | Ht 59.0 in | Wt 135.0 lb

## 2016-08-30 DIAGNOSIS — I10 Essential (primary) hypertension: Secondary | ICD-10-CM | POA: Diagnosis not present

## 2016-08-30 DIAGNOSIS — I482 Chronic atrial fibrillation, unspecified: Secondary | ICD-10-CM

## 2016-08-30 DIAGNOSIS — I6310 Cerebral infarction due to embolism of unspecified precerebral artery: Secondary | ICD-10-CM | POA: Diagnosis not present

## 2016-08-30 DIAGNOSIS — I350 Nonrheumatic aortic (valve) stenosis: Secondary | ICD-10-CM | POA: Diagnosis not present

## 2016-08-30 NOTE — Progress Notes (Signed)
Cardiology Office Note  Date:  08/30/2016   ID:  Zaide Mcclenahan, DOB 09-Mar-1919, MRN [id number]  PCP:  Leone Haven, MD   Chief Complaint  Patient presents with  . other    F/u ED c/o sob with exertion and pt would like to discuss Lipitor . Meds reviewed verbally with pt.    HPI:  Ms. Pugmire is a very pleasant 83 year old woman with  severe aortic valve stenosis,   atrial fibrillation (not on anticoagulation secondary to GI bleed) ,  GI bleed from AVM,  stroke June 2016, documented on MRI,  who presents for follow-up of her aortic valve stenosis , chronic atrial fibrillation, Recent STEMI  STEMI 08/09/2016  Ost 1st Diag lesion, 70 %stenosed.  A STENT SYNERGY DES 2.5X16 drug eluting stent was successfully placed.  Prox LAD lesion, 100 %stenosed.  Post intervention, there is a 0% residual stenosis.  1. Acute anterolateral STEMI secondary to thrombotic occlusion of known severe mid LAD and diagonal stenosis.  Successful PTCA/DES x 1 mid LAD  Other records reviewed on today's visit with her Echo 08/10/2016 EF 25 to 30%  In follow-up she reports feeling somewhat weak but okay Ambulating with a walker, denies significant shortness of breath, no chest pain on exertion  no near syncope or syncope. Denies any orthostasis symptoms.  no leg swelling  echocardiogram results  September 2017  Severe aortic valve stenosis, increase in valve velocity, mean and peak gradient  EKG on today's visit shows atrial fibrillation with ventricular rate 78 bpm, left bundle branch block  Other past medical history reviewed  presented to the hospital on January 02 2011 with atrial fibrillation with RVR, elevated BNP, CT scan showing bilateral moderate sized pleural effusions who was started on rate control and pradaxa.  She refused cardioversion after 4 weeks on anticoagulation. At approximately 6 weeks out, she had a AVM bleed and presented to the hospital with melena and hematocrit of  18. She had been having melena for at least one week and did not go to the hospital. She had EGD with treatment of her AVM. All anticoagulation was held.   We had discussed the case with Dr. Gustavo Lah (GI) and it was felt that given her risk of further bleeding from additional AVMs and given her age and the extent of her recent bleed, that no further anticoagulation be used.    on aspirin and Plavix , does not want stronger blood thinners as she is doing well  Previous EKG showing atrial fibrillation with left bundle branch block, rate in the 69s  admitted to the hospital 09/10/2014 for stroke, acute stroke on the left On MRI She recovered well despite initial slurred speech  Last echocardiogram 2/ 2016  hospitalization at the end of February 2016 with stroke type symptoms, stroke on MRI Family had reported a mechanical fall, imaging showed pubic ramus fracture, day prior to admission she was found to be unresponsive and would not answer verbal commands. She became more alert during the daytime, slurred speech. They brought her to the hospital 05/19/2014. She is lethargic, bradycardia, hypotension, given IV fluids with improvement of her symptoms. MRI showing multiple strokes, old Felt to be a high risk bleeding candidate, started on Plavix  Echocardiogram in the hospital showed ejection fraction 70%, moderate to severe aortic valve stenosis, moderate MR Carotid ultrasound 05/20/2014 showing moderate bilateral carotid arterial disease worse on the left  fall in November 2013. She broke her leg and had surgery with a rod placed  on the right.   Echocardiogram while she was in atrial fibrillation showed ejection fraction 35-45%, normal left ventricular systolic pressures, moderate aortic valve stenosis with mean gradient of 32 mmHg  Previous lab work: total cholesterol 132, LDL 82, HDL 40, triglycerides 502, hemoglobin A1c 4.9  Previous CT Scan of the chest showing moderate  bilateral pleural effusions with compressive atelectasis in both lower lobes, no PE   PMH:   has a past medical history of A-fib ([place]); Broken leg; Cardiomyopathy; Chronic kidney disease; GERD (gastroesophageal reflux disease); Heart murmur; Hiatal hernia; Hypertension; Mild mitral regurgitation by prior echocardiogram; Moderate aortic stenosis; Stroke [facility]); and Tricuspid regurgitation.  PSH:    Past Surgical History:  Procedure Laterality Date  . APPENDECTOMY    . CATARACT EXTRACTION  2013   left  . CHOLECYSTECTOMY    . CORONARY ANGIOGRAPHY N/A 08/09/2016   Procedure: Coronary Angiography;  Surgeon: Burnell Blanks, MD;  Location: [facility] [facility];  Service: Cardiovascular;  Laterality: N/A;  . CORONARY STENT INTERVENTION N/A 08/09/2016   Procedure: Coronary Stent Intervention;  Surgeon: Burnell Blanks, MD;  Location: [place] [facility];  Service: Cardiovascular;  Laterality: N/A;  . FEMUR FRACTURE SURGERY  12/2011   Dr. Sabra Heck, s/p rehab at [facility]  . GALLBLADDER SURGERY    . IR RADIOLOGY PERIPHERAL GUIDED IV START  07/29/2016  . IR US GUIDE VASC ACCESS RIGHT  07/29/2016  . VAGINAL DELIVERY      Current Outpatient Prescriptions  Medication Sig Dispense Refill  . aspirin EC 81 MG tablet Take 1 tablet (81 mg total) by mouth daily. 30 tablet 0  . clopidogrel (PLAVIX) 75 MG tablet Take 75 mg by mouth every other day.    . furosemide (LASIX) 20 MG tablet Take 1 tablet (20 mg total) by mouth every other day. 45 tablet 3  . metoprolol tartrate (LOPRESSOR) 25 MG tablet Take 12.5 mg by mouth 2 (two) times daily.    . nitroGLYCERIN (NITROSTAT) 0.4 MG SL tablet Place 1 tablet (0.4 mg total) under the tongue every 5 (five) minutes x 3 doses as needed for chest pain. 25 tablet 12  . pantoprazole (PROTONIX) 40 MG tablet TAKE 1 TABLET EVERY DAY 90 tablet 3  . potassium chloride (K-DUR,KLOR-CON) 10 MEQ tablet Take 1 tablet (10 mEq total) by mouth every other day. 135 tablet 3  .  atorvastatin (LIPITOR) 80 MG tablet Take 1 tablet (80 mg total) by mouth daily at 6 PM. (Patient not taking: Reported on 08/30/2016) 30 tablet 6   No current facility-administered medications for this visit.      Allergies:   Pradaxa [dabigatran etexilate mesylate]; Clonidine derivatives; and Penicillins   Social History:  The patient  reports that she has never smoked. She has never used smokeless tobacco. She reports that she does not drink alcohol or use drugs.   Family History:   family history includes Cancer in her brother and sister; Diabetes in her brother; Stroke in her sister.    Review of Systems: Review of Systems  Constitutional: Negative.   Respiratory: Negative.   Cardiovascular: Negative.   Gastrointestinal: Negative.   Musculoskeletal: Negative.        Leg weakness  Neurological: Negative.   Psychiatric/Behavioral: Negative.   All other systems reviewed and are negative.    PHYSICAL EXAM: VS:  BP 124/84 (BP Location: Right Arm, Patient Position: Sitting, Cuff Size: Normal)   Pulse 78   Ht 4\' 11"  (1.499 m)   Wt 135  lb (61.2 kg)   BMI 27.27 kg/m  , BMI Body mass index is 27.27 kg/m. GEN: Well nourished, well developed, in no acute distress , walks with a walker HEENT: normal  Neck: no JVD, carotid bruits, or masses Cardiac: RRR; 3/6 SEM RSB,  no rubs, or gallops,no edema  Respiratory:  clear to auscultation bilaterally, normal work of breathing GI: soft, nontender, nondistended, + BS MS: no deformity or atrophy  Skin: warm and dry, no rash Neuro:  Strength and sensation are intact Psych: euthymic mood, full affect    Recent Labs: 08/09/2016: ALT 10 08/11/2016: BUN 16; Creatinine, Ser 1.61; Hemoglobin 10.2; Platelets 141; Potassium 4.3; Sodium 135    Lipid Panel Lab Results  Component Value Date   CHOL 154 08/09/2016   HDL 33 (L) 08/09/2016   LDLCALC 102 (H) 08/09/2016   TRIG 94 08/09/2016      Wt Readings from Last 3 Encounters:  08/30/16 135  lb (61.2 kg)  08/26/16 137 lb 1.9 oz (62.2 kg)  08/14/16 140 lb 3.2 oz (63.6 kg)       ASSESSMENT AND PLAN:  Chronic atrial fibrillation (HCC) - Plan: EKG 12-Lead, ECHOCARDIOGRAM COMPLETE Rate relatively well-controlled Currently on aspirin and Plavix given  history of strokes.   she has declined warfarin or other more aggressive anticoagulation for atrial fibrillation. She is aware of risk of stroke on her current regimen. She is concerned about recurrent GI bleeding  Cerebrovascular accident (CVA) due to embolism of precerebral artery ([place]) - Plan: EKG 12-Lead, ECHOCARDIOGRAM COMPLETE Documented on MRI.   declined aggressive anticoagulation for atrial fibrillation given history of GI bleeding  Essential hypertension - Plan: EKG 12-Lead, ECHOCARDIOGRAM COMPLETE Blood pressure is well controlled on today's visit. No changes made to the medications. We'll not add ACE or ARB given chronic renal insufficiency  Aortic valve stenosis, severe - Plan: EKG 12-Lead, ECHOCARDIOGRAM COMPLETE I will forward to Dr. Burt Knack, she is still interested in TAVR. Tolerating aspirin and Plavix with no GI bleed  CKD (chronic kidney disease), stage III  stable renal function,   STEMI Mid LAD stent, recovered well On aspirin Plavix   Total encounter time more than 25 minutes  Greater than 50% was spent in counseling and coordination of care with the patient   Disposition:   F/U  6 months   No orders of the defined types were placed in this encounter.    Signed, Esmond Plants, M.D., Ph.D. 08/30/2016  [place] [address], [place]

## 2016-08-30 NOTE — Patient Instructions (Signed)

## 2016-09-02 NOTE — Progress Notes (Signed)
thx Tim. Will arrange FU echo and office visit to see if she's had some early recovery from the MI and I will discuss next steps with them.   Ronalee Belts

## 2016-09-03 ENCOUNTER — Other Ambulatory Visit: Payer: Self-pay

## 2016-09-03 DIAGNOSIS — I2102 ST elevation (STEMI) myocardial infarction involving left anterior descending coronary artery: Secondary | ICD-10-CM

## 2016-09-03 DIAGNOSIS — I350 Nonrheumatic aortic (valve) stenosis: Secondary | ICD-10-CM

## 2016-09-03 NOTE — Progress Notes (Signed)
I spoke with the pt's daughter Katharine Look and scheduled the pt for an echocardiogram and office visit with Dr Burt Knack on 09/23/16.

## 2016-09-23 ENCOUNTER — Ambulatory Visit (HOSPITAL_COMMUNITY): Payer: Medicare Other | Attending: Internal Medicine

## 2016-09-23 ENCOUNTER — Encounter: Payer: Self-pay | Admitting: Cardiovascular Disease

## 2016-09-23 ENCOUNTER — Ambulatory Visit (INDEPENDENT_AMBULATORY_CARE_PROVIDER_SITE_OTHER): Payer: Medicare Other | Admitting: Cardiovascular Disease

## 2016-09-23 ENCOUNTER — Other Ambulatory Visit: Payer: Self-pay | Admitting: Cardiovascular Disease

## 2016-09-23 ENCOUNTER — Other Ambulatory Visit: Payer: Self-pay

## 2016-09-23 VITALS — BP 124/80 | HR 82 | Ht 59.0 in | Wt 133.1 lb

## 2016-09-23 DIAGNOSIS — I2102 ST elevation (STEMI) myocardial infarction involving left anterior descending coronary artery: Secondary | ICD-10-CM

## 2016-09-23 DIAGNOSIS — I350 Nonrheumatic aortic (valve) stenosis: Secondary | ICD-10-CM

## 2016-09-23 DIAGNOSIS — I340 Nonrheumatic mitral (valve) insufficiency: Secondary | ICD-10-CM | POA: Diagnosis not present

## 2016-09-23 DIAGNOSIS — I6310 Cerebral infarction due to embolism of unspecified precerebral artery: Secondary | ICD-10-CM

## 2016-09-23 DIAGNOSIS — R29898 Other symptoms and signs involving the musculoskeletal system: Secondary | ICD-10-CM | POA: Insufficient documentation

## 2016-09-23 DIAGNOSIS — I517 Cardiomegaly: Secondary | ICD-10-CM | POA: Insufficient documentation

## 2016-09-23 DIAGNOSIS — I361 Nonrheumatic tricuspid (valve) insufficiency: Secondary | ICD-10-CM | POA: Insufficient documentation

## 2016-09-23 NOTE — Patient Instructions (Signed)
Medication Instructions:  Your physician recommends that you continue on your current medications as directed. Please refer to the Current Medication list given to you today.  Labwork: No new orders.   Testing/Procedures: No new orders.   Follow-Up: Your physician recommends that you schedule a follow-up appointment as needed with Dr Burt Knack.   Please continue routine cardiology follow-up with Dr Rockey Situ.   Any Other Special Instructions Will Be Listed Below (If Applicable).     If you need a refill on your cardiac medications before your next appointment, please call your pharmacy.

## 2016-09-23 NOTE — Progress Notes (Signed)
Cardiology Office Note Date:  09/23/2016   ID:  Dawn Cooper, DOB Feb 06, 1919, MRN [id number]  PCP:  Leone Haven, MD  Cardiologist:  Sherren Mocha, MD    Chief Complaint  Patient presents with  . Follow-up     History of Present Illness: Dawn Cooper is a 83 y.o. female who presents for follow-up of aortic valve disease. She was initially seen 07/12/2016 for severe symptomatic aortic stenosis. She has multiple comorbid conditions including history of stroke, GI bleeding secondary to AVMs unable to tolerate oral anticoagulation, and permanent atrial fibrillation. She was undergoing evaluation for TAVR and cardiac catheterization demonstrated severe LAD stenosis. 48 hours later she presented with an anterior STEMI treated with PCI. She had severe LV dysfunction with LVEF 25-30% following her infarct. She returns today for a follow-up echocardiogram and office visit to discuss whether to proceed with TAVR.  The patient is getting along okay. She ambulates with a cane. She is living with her daughter. The patient has shortness of breath with low-level activity but she is comfortable at rest. She denies any recurrence of chest pain or pressure. She denies orthopnea or PND. She has chronic lightheadedness without any history of syncope. States that she feels "lazy" and that she has not returned to her baseline activity level since her heart attack.   Past Medical History:  Diagnosis Date  . A-fib ([place])   . Broken leg    right  . Cardiomyopathy   . Chronic kidney disease   . GERD (gastroesophageal reflux disease)   . Heart murmur   . Hiatal hernia   . Hypertension   . Mild mitral regurgitation by prior echocardiogram   . Moderate aortic stenosis   . Stroke ([place])   . Tricuspid regurgitation     Past Surgical History:  Procedure Laterality Date  . APPENDECTOMY    . CATARACT EXTRACTION  2013   left  . CHOLECYSTECTOMY    . CORONARY ANGIOGRAPHY N/A 08/09/2016   Procedure:  Coronary Angiography;  Surgeon: Burnell Blanks, MD;  Location: [facility];  Service: Cardiovascular;  Laterality: N/A;  . CORONARY STENT INTERVENTION N/A 08/09/2016   Procedure: Coronary Stent Intervention;  Surgeon: Burnell Blanks, MD;  Location: [facility];  Service: Cardiovascular;  Laterality: N/A;  . FEMUR FRACTURE SURGERY  12/2011   Dr. Sabra Heck, s/p rehab at [facility]  . GALLBLADDER SURGERY    . IR RADIOLOGY PERIPHERAL GUIDED IV START  07/29/2016  . IR US GUIDE VASC ACCESS RIGHT  07/29/2016  . VAGINAL DELIVERY      Current Outpatient Prescriptions  Medication Sig Dispense Refill  . aspirin EC 81 MG tablet Take 1 tablet (81 mg total) by mouth daily. 30 tablet 0  . atorvastatin (LIPITOR) 80 MG tablet Take 80 mg by mouth as directed. Patient states she doesn't take it every day. May take it every other day or every 2 days    . clopidogrel (PLAVIX) 75 MG tablet Take 75 mg by mouth every other day.    . furosemide (LASIX) 20 MG tablet Take 1 tablet (20 mg total) by mouth every other day. 45 tablet 3  . metoprolol tartrate (LOPRESSOR) 25 MG tablet Take 12.5 mg by mouth 2 (two) times daily.    . nitroGLYCERIN (NITROSTAT) 0.4 MG SL tablet Place 1 tablet (0.4 mg total) under the tongue every 5 (five) minutes x 3 doses as needed for chest pain. 25 tablet 12  . pantoprazole (PROTONIX) 40 MG  tablet Take 40 mg by mouth daily.    . potassium chloride (K-DUR,KLOR-CON) 10 MEQ tablet Take 1 tablet (10 mEq total) by mouth every other day. 135 tablet 3   No current facility-administered medications for this visit.     Allergies:   Pradaxa [dabigatran etexilate mesylate]; Clonidine derivatives; and Penicillins   Social History:  The patient  reports that she has never smoked. She has never used smokeless tobacco. She reports that she does not drink alcohol or use drugs.   Family History:  The patient's family history includes Cancer in her brother and sister; Diabetes in her  brother; Stroke in her sister.    ROS:  Please see the history of present illness.  Otherwise, review of systems is positive for Fatigue, dizziness, easy bruising.  All other systems are reviewed and negative.    PHYSICAL EXAM: VS:  BP 124/80   Pulse 82   Ht 4\' 11"  (1.499 m)   Wt 133 lb 1.9 oz (60.4 kg)   BMI 26.89 kg/m  , BMI Body mass index is 26.89 kg/m. GEN: Pleasant elderly woman, in no acute distress  HEENT: normal  Neck: no JVD, no masses.  Cardiac: irregularly irregular with 3/6 harsh systolic murmur at the RUSB               Respiratory:  clear to auscultation bilaterally, normal work of breathing GI: soft, nontender, nondistended, + BS MS: no deformity or atrophy  Ext: trace bilateral pretibial edema Skin: warm and dry, no rash Neuro:  Strength and sensation are intact Psych: euthymic mood, full affect  EKG:  EKG is not ordered today.  Recent Labs: 08/09/2016: ALT 10 08/11/2016: BUN 16; Creatinine, Ser 1.61; Hemoglobin 10.2; Platelets 141; Potassium 4.3; Sodium 135   Lipid Panel     Component Value Date/Time   CHOL 154 08/09/2016 2325   TRIG 94 08/09/2016 2325   HDL 33 (L) 08/09/2016 2325   CHOLHDL 4.7 08/09/2016 2325   VLDL 19 08/09/2016 2325   LDLCALC 102 (H) 08/09/2016 2325      Wt Readings from Last 3 Encounters:  09/23/16 133 lb 1.9 oz (60.4 kg)  08/30/16 135 lb (61.2 kg)  08/26/16 137 lb 1.9 oz (62.2 kg)     Cardiac Studies Reviewed: Formal interpretation is pending  ASSESSMENT AND PLAN: 1.  Severe, stage D, aortic stenosis 2. Coronary artery disease status post anterior MI treated with primary PCI 3. Chronic systolic heart failure, New York Heart Association functional class III 4. Chronic kidney disease stage IV 5. Permanent atrial fibrillation 6. Cerebrovascular disease status post stroke  I have personally reviewed the patients echo images today. Aortic valve assessment is not done. LV function remains severely depressed with a large  anterolateral/apical wall motion abnormality. The anteroseptal wall and apex are akinetic. We discussed considerations of treatment options which include proceeding with TAVR versus a palliative medical approach. I think considering the patient's poor functional status, very advanced age, recent MI with severe residual LV dysfunction, and other comorbid medical conditions, it is best to treat her with a palliative medical approach. The patient and her daughter are counseled at length and understand the rationale for this. They agree with this plan and she prefers to avoid any more procedures if at all possible. I would be happy to see her back as needed if I can help with her care in the future.  Current medicines are reviewed with the patient today.  The patient does not have concerns  regarding medicines.  Labs/ tests ordered today include:  No orders of the defined types were placed in this encounter.   Disposition:   FU prn. FU Dr Rockey Situ as scheduled.   Deatra James, MD  09/23/2016 5:37 PM    [facility] [address], [place], [place]  [address] Phone: [phone]; Fax: [phone]

## 2016-09-24 ENCOUNTER — Other Ambulatory Visit: Payer: Self-pay

## 2016-09-24 ENCOUNTER — Telehealth: Payer: Self-pay | Admitting: Cardiovascular Disease

## 2016-09-24 ENCOUNTER — Telehealth: Payer: Self-pay | Admitting: Family Medicine

## 2016-09-24 MED ORDER — CLOPIDOGREL BISULFATE 75 MG PO TABS: 75.0000 mg | ORAL_TABLET | ORAL | 1 refills | Status: DC

## 2016-09-24 MED ORDER — METOPROLOL TARTRATE 25 MG PO TABS: 12.5000 mg | ORAL_TABLET | Freq: Two times a day (BID) | ORAL | 2 refills | Status: DC

## 2016-09-24 NOTE — Telephone Encounter (Signed)
Pt daughter called about pt needing a refill for clopidogrel (PLAVIX) 75 MG tablet.  Pharmacy is [place], [place]  Call daughter Katharine Look if needed @ [phone]. Thank you!

## 2016-09-24 NOTE — Telephone Encounter (Signed)
Patients daughter states she takes this every other day

## 2016-09-24 NOTE — Telephone Encounter (Signed)
Last OV 08/26/16 last filed under historical

## 2016-09-24 NOTE — Telephone Encounter (Signed)
Sent to pharmacy. Please confirm if she takes this daily or every other day. Thanks.

## 2016-09-24 NOTE — Telephone Encounter (Signed)
Refills sent.   Requested Prescriptions   Signed Prescriptions Disp Refills  . metoprolol tartrate (LOPRESSOR) 25 MG tablet 60 tablet 2    Sig: Take 0.5 tablets (12.5 mg total) by mouth 2 (two) times daily.    Authorizing Provider: Minna Merritts    Ordering User: Janan Ridge

## 2016-09-24 NOTE — Telephone Encounter (Signed)
Requested Prescriptions   Signed Prescriptions Disp Refills  . metoprolol tartrate (LOPRESSOR) 25 MG tablet 60 tablet 2    Sig: Take 0.5 tablets (12.5 mg total) by mouth 2 (two) times daily.    Authorizing Provider: Minna Merritts    Ordering User: Janan Ridge

## 2016-09-24 NOTE — Telephone Encounter (Signed)
°*  STAT* If patient is at the pharmacy, call can be transferred to refill team.   1. Which medications need to be refilled? (please list name of each medication and dose if known)  Metoprolol   2. Which pharmacy/location (including street and city if local pharmacy) is medication to be sent to? [facility]   3. Do they need a 30 day or 90 day supply? 90 day

## 2016-09-26 MED ORDER — CLOPIDOGREL BISULFATE 75 MG PO TABS: 75.0000 mg | ORAL_TABLET | ORAL | 1 refills | Status: DC

## 2016-09-26 NOTE — Telephone Encounter (Signed)
Sent to correct pharmacy

## 2016-09-26 NOTE — Telephone Encounter (Signed)
Pt daughter called and stated that the medication was called into the wrong pharmacy. They would like to have it sent into [place], [place]. Please advise, thank you.

## 2016-10-01 ENCOUNTER — Ambulatory Visit (INDEPENDENT_AMBULATORY_CARE_PROVIDER_SITE_OTHER): Payer: Medicare Other | Admitting: *Deleted

## 2016-10-01 DIAGNOSIS — E538 Deficiency of other specified B group vitamins: Secondary | ICD-10-CM

## 2016-10-01 MED ORDER — CYANOCOBALAMIN 1000 MCG/ML IJ SOLN
1000.0000 ug | Freq: Once | INTRAMUSCULAR | Status: AC
  Administered 2016-10-01: 1000 ug via INTRAMUSCULAR

## 2016-10-01 NOTE — Progress Notes (Signed)
I have reviewed the above note and agree.  Delois Silvester, M.D.  

## 2016-10-01 NOTE — Progress Notes (Signed)
Patient presented for B 12 injection to right deltoid, patient voiced no concerns nor showed any signs of distress during injection. 

## 2016-11-05 ENCOUNTER — Ambulatory Visit (INDEPENDENT_AMBULATORY_CARE_PROVIDER_SITE_OTHER): Payer: Medicare Other | Admitting: *Deleted

## 2016-11-05 DIAGNOSIS — E538 Deficiency of other specified B group vitamins: Secondary | ICD-10-CM | POA: Diagnosis not present

## 2016-11-05 IMAGING — MR MRI HEAD WITHOUT CONTRAST
9 of 10 series · 39 of 48 positions shown · non-contrast
Comparison: Head CT 05/20/2014

CLINICAL DATA: Stroke.  Dizziness and blurred vision for 1 day.

EXAM:
MRI HEAD WITHOUT CONTRAST
TECHNIQUE: Multiplanar, multiecho pulse sequences of the brain and surrounding
structures were obtained without intravenous contrast.

[Series 4: DWI · axial · 5.0mm · 0.94mm/px · z∈[-46,+106]mm · 4 of 27 slices shown (1 of 4)]
[im 1/27]
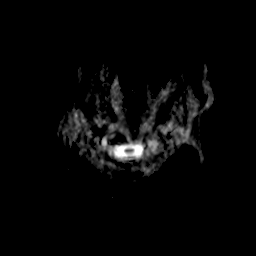
[im 9/27]
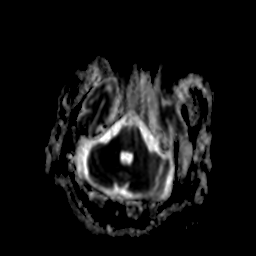
[im 18/27]
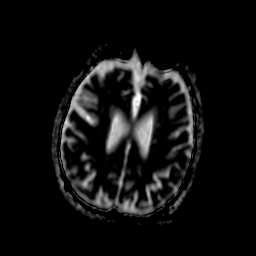
[im 27/27]
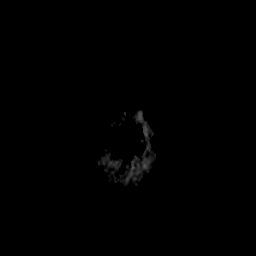

[Series 6: DWI · coronal · 5.0mm · 0.94mm/px · 6 of 37 slices shown (2 of 4)]
[im 1/37]
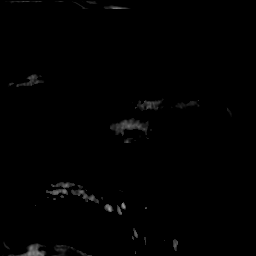
[im 8/37]
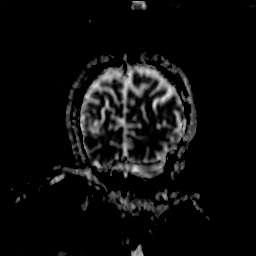
[im 15/37]
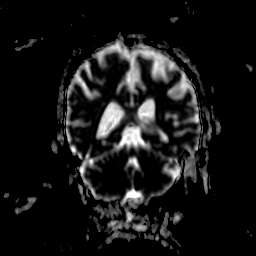
[im 22/37]
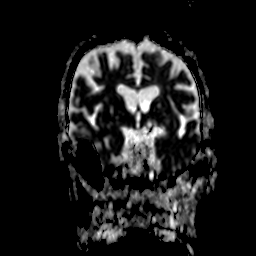
[im 29/37]
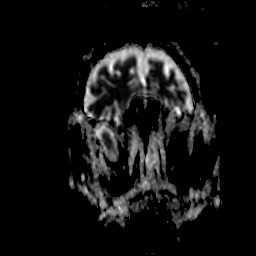
[im 37/37]
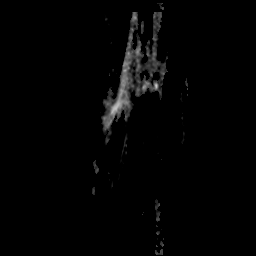

[Series 7: DWI · axial · 5.0mm · 0.94mm/px · z∈[-46,+106]mm · 4 of 27 slices shown (3 of 4)]
[im 1/27]
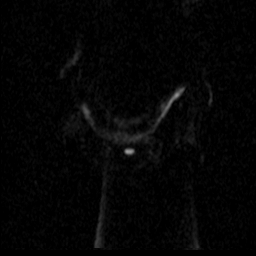
[im 9/27]
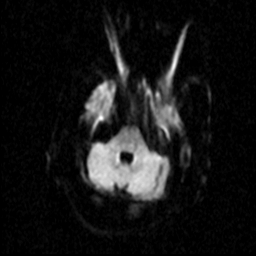
[im 18/27]
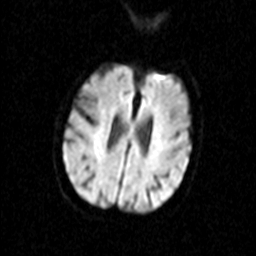
[im 27/27]
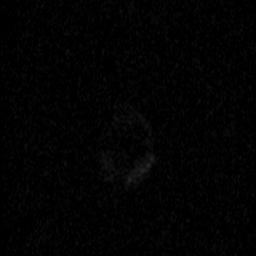

[Series 8: T2 · axial · 5.0mm · 0.81mm/px · z∈[-48,+103]mm · 4 of 27 slices shown (1 of 3)]
[im 1/27]
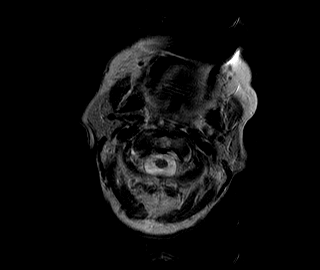
[im 9/27]
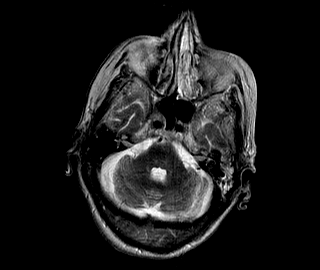
[im 18/27]
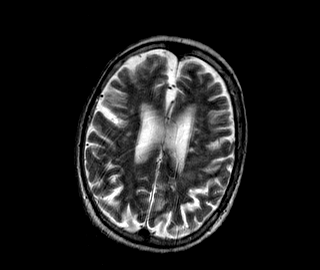
[im 27/27]
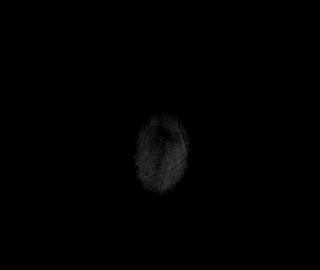

[Series 9: DWI · coronal · 5.0mm · 0.94mm/px · 5 of 30 slices shown (4 of 4)]
[im 1/30]
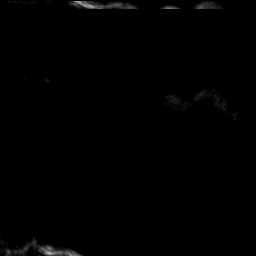
[im 8/30]
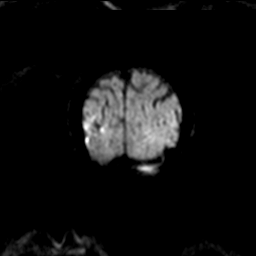
[im 15/30]
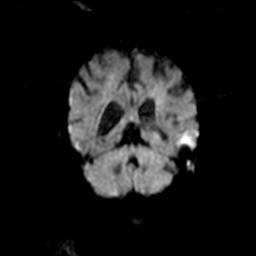
[im 22/30]
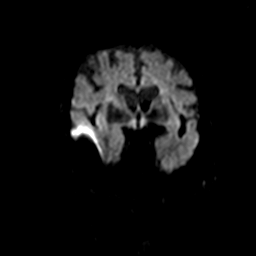
[im 30/30]
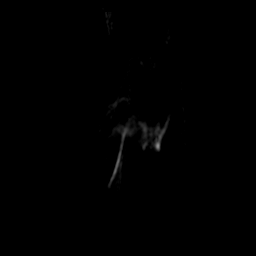

[Series 10: T1 dynamic · axial · 3.0mm · 0.38mm/px · z∈[-56,+8]mm · 4 of 60 slices shown]
[im 1/60]
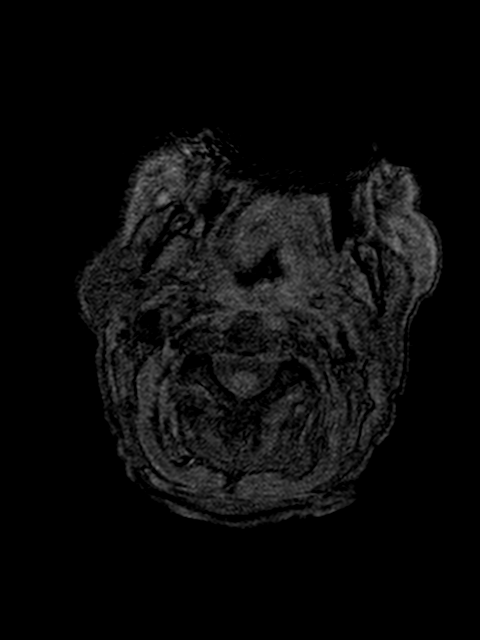
[im 8/60]
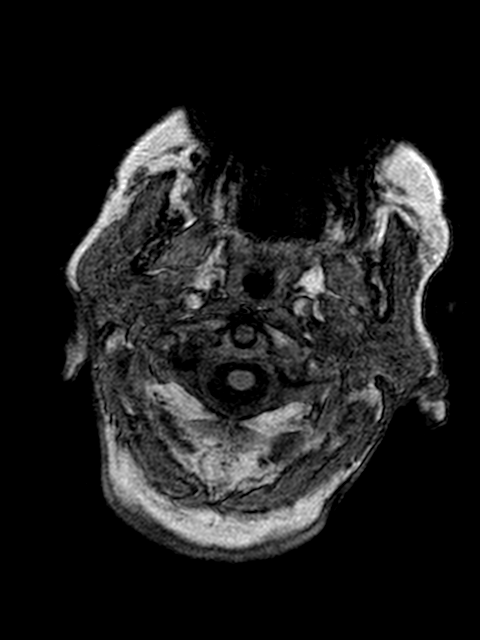
[im 15/60]
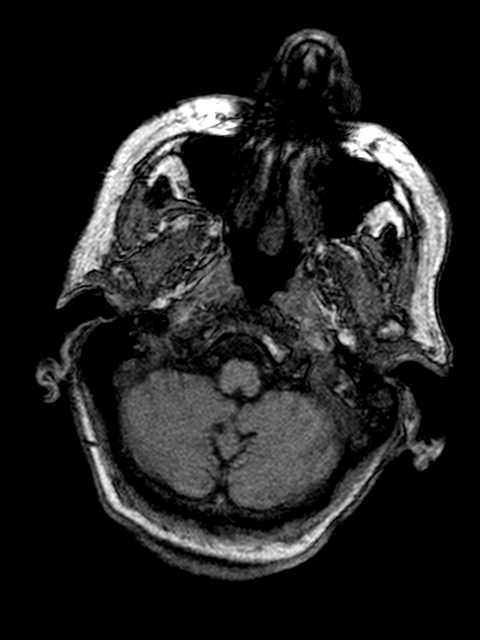
[im 23/60]
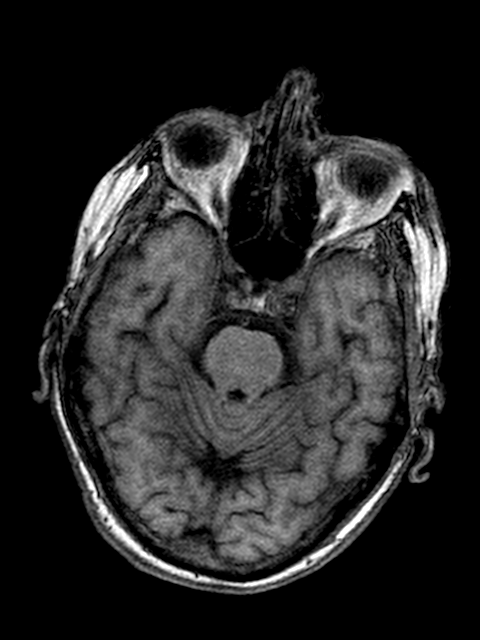

[Series 11: T2 · axial · 5.0mm · 0.45mm/px · z∈[-47,+105]mm · 4 of 27 slices shown (2 of 3)]
[im 1/27]
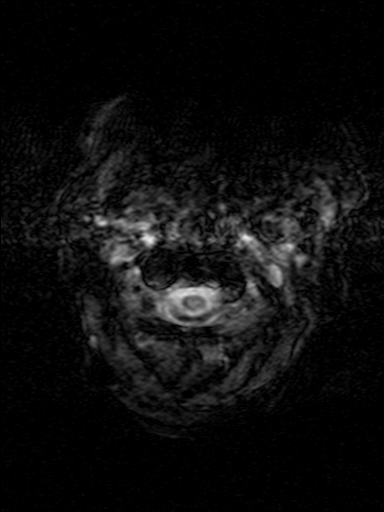
[im 9/27]
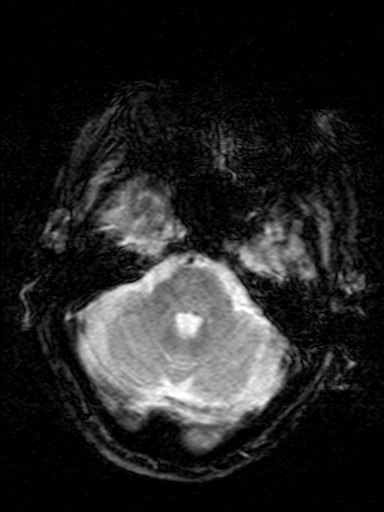
[im 18/27]
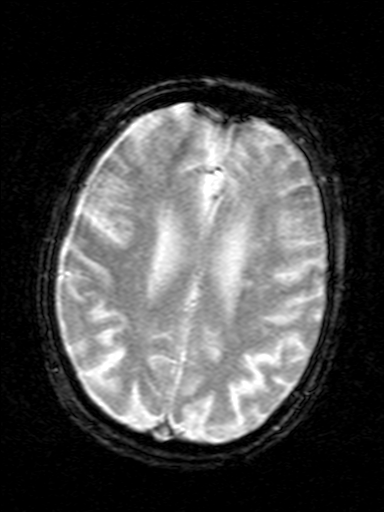
[im 27/27]
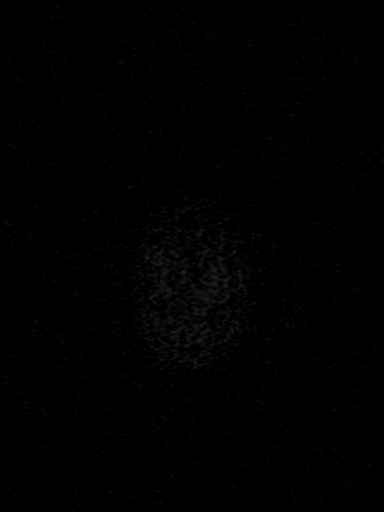

[Series 12: FLAIR · axial · 5.0mm · 0.90mm/px · z∈[-47,+105]mm · 4 of 27 slices shown]
[im 1/27]
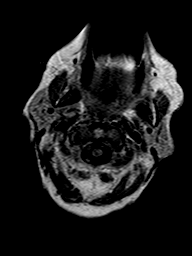
[im 9/27]
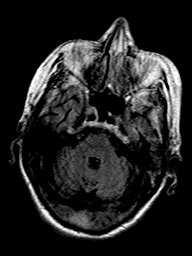
[im 18/27]
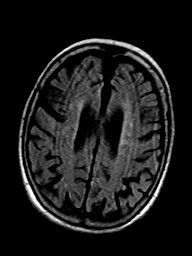
[im 27/27]
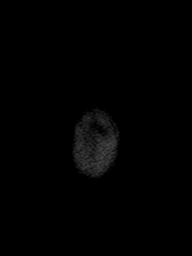

[Series 13: T2 · coronal · 5.0mm · 0.51mm/px · 4 of 29 slices shown (3 of 3)]
[im 1/29]
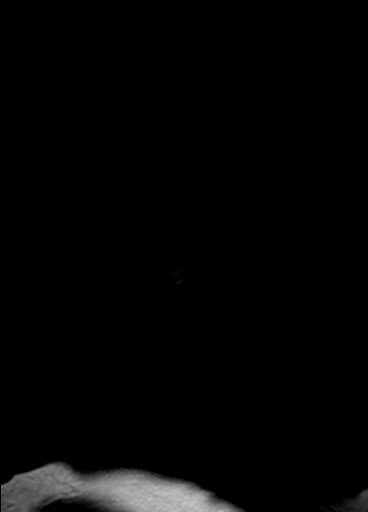
[im 10/29]
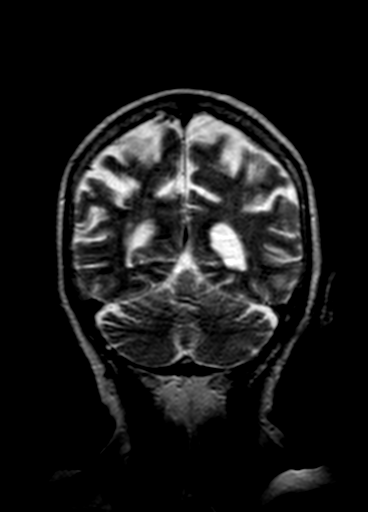
[im 19/29]
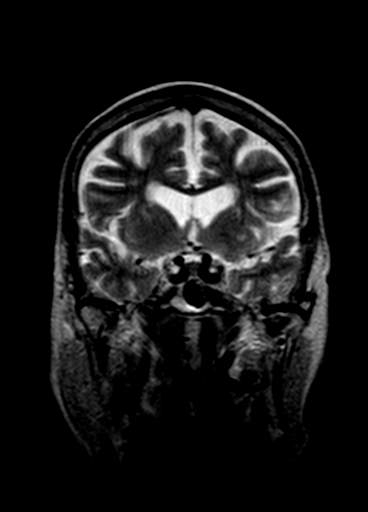
[im 29/29]
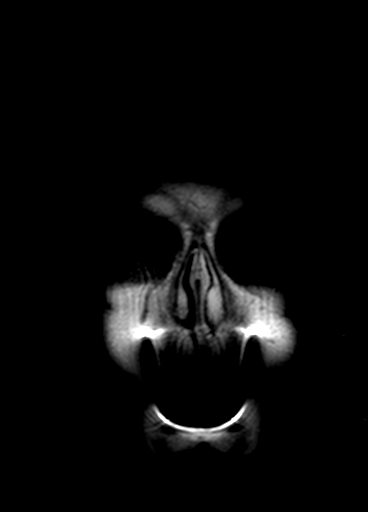

[39 of 48 positions shown; findings below may reference images not displayed]

FINDINGS: Images are mildly to moderately degraded by motion artifact.

There are small acute infarcts symmetrically involving the medial
thalami with extension into the upper midbrain left of midline.
There is may also be a punctate acute cortical infarct in the right
occipital lobe. There is a small, chronic right parieto-occipital
cortical infarct with a small amount of associated chronic blood
products.

There is mild-to-moderate generalized cerebral atrophy. Foci of T2
hyperintensity in the subcortical and deep cerebral white matter
elsewhere are nonspecific but compatible with mild chronic small
vessel ischemic disease. There is no mass, midline shift, or
extra-axial fluid collection.

Prior bilateral cataract extraction is noted. Paranasal sinuses are
clear. There is a moderate left mastoid effusion. Major intracranial
vascular flow voids are grossly preserved.
IMPRESSION: 1. Small, acute infarcts involving the medial thalami and upper
midbrain. Configuration is consistent with an artery of [place]
infarct.
2. Possible punctate acute right occipital cortical infarct.
3. Chronic right parieto-occipital infarct.
4. Mild chronic small vessel ischemic disease.

## 2016-11-05 MED ORDER — CYANOCOBALAMIN 1000 MCG/ML IJ SOLN
1000.0000 ug | Freq: Once | INTRAMUSCULAR | Status: AC
  Administered 2016-11-05: 1000 ug via INTRAMUSCULAR

## 2016-11-05 NOTE — Progress Notes (Signed)
I have reviewed the above note and agree.  Dwain Huhn, M.D.  

## 2016-11-05 NOTE — Progress Notes (Signed)
Patient presented for B 12 injection to right deltoid, patient voiced no concerns nor showed any signs of distress during injection. 

## 2016-11-22 ENCOUNTER — Other Ambulatory Visit: Payer: Self-pay

## 2016-11-22 NOTE — Telephone Encounter (Signed)
Last office visit 08/26/16 Next office visit 01/01/17 Last prescribed by Dr Gilford Rile

## 2016-11-23 MED ORDER — PANTOPRAZOLE SODIUM 40 MG PO TBEC: 40.0000 mg | DELAYED_RELEASE_TABLET | Freq: Every day | ORAL | 1 refills | Status: DC

## 2016-12-02 ENCOUNTER — Telehealth: Payer: Self-pay | Admitting: *Deleted

## 2016-12-02 MED ORDER — PANTOPRAZOLE SODIUM 40 MG PO TBEC: 40.0000 mg | DELAYED_RELEASE_TABLET | Freq: Every day | ORAL | 1 refills | Status: DC

## 2016-12-02 NOTE — Telephone Encounter (Signed)
Pt requested a medication refill for pantoprazole Pharmacy [facility]

## 2016-12-02 NOTE — Telephone Encounter (Signed)
Sent to [facility] 

## 2016-12-10 ENCOUNTER — Ambulatory Visit (INDEPENDENT_AMBULATORY_CARE_PROVIDER_SITE_OTHER): Payer: Medicare Other | Admitting: *Deleted

## 2016-12-10 DIAGNOSIS — E538 Deficiency of other specified B group vitamins: Secondary | ICD-10-CM | POA: Diagnosis not present

## 2016-12-10 MED ORDER — CYANOCOBALAMIN 1000 MCG/ML IJ SOLN
1000.0000 ug | Freq: Once | INTRAMUSCULAR | Status: AC
  Administered 2016-12-10: 1000 ug via INTRAMUSCULAR

## 2016-12-10 NOTE — Progress Notes (Signed)
Patient presented for B 12 injection to left deltoid, patient voiced no concerns nor showed any signs of distress during injection. 

## 2016-12-10 NOTE — Progress Notes (Signed)
I have reviewed the above note and agree.  Eric Sonnenberg, M.D.  

## 2016-12-30 ENCOUNTER — Other Ambulatory Visit: Payer: Self-pay | Admitting: Cardiovascular Disease

## 2016-12-30 DIAGNOSIS — R609 Edema, unspecified: Secondary | ICD-10-CM

## 2017-01-01 ENCOUNTER — Ambulatory Visit (INDEPENDENT_AMBULATORY_CARE_PROVIDER_SITE_OTHER): Payer: Medicare Other | Admitting: Family Medicine

## 2017-01-01 ENCOUNTER — Encounter: Payer: Self-pay | Admitting: Family Medicine

## 2017-01-01 VITALS — BP 124/90 | HR 77 | Temp 97.7°F | Wt 137.0 lb

## 2017-01-01 DIAGNOSIS — I482 Chronic atrial fibrillation, unspecified: Secondary | ICD-10-CM

## 2017-01-01 DIAGNOSIS — I252 Old myocardial infarction: Secondary | ICD-10-CM

## 2017-01-01 DIAGNOSIS — K219 Gastro-esophageal reflux disease without esophagitis: Secondary | ICD-10-CM

## 2017-01-01 DIAGNOSIS — Z23 Encounter for immunization: Secondary | ICD-10-CM | POA: Diagnosis not present

## 2017-01-01 DIAGNOSIS — M7542 Impingement syndrome of left shoulder: Secondary | ICD-10-CM | POA: Insufficient documentation

## 2017-01-01 DIAGNOSIS — D649 Anemia, unspecified: Secondary | ICD-10-CM | POA: Diagnosis not present

## 2017-01-01 DIAGNOSIS — I6310 Cerebral infarction due to embolism of unspecified precerebral artery: Secondary | ICD-10-CM

## 2017-01-01 DIAGNOSIS — L989 Disorder of the skin and subcutaneous tissue, unspecified: Secondary | ICD-10-CM | POA: Insufficient documentation

## 2017-01-01 DIAGNOSIS — I10 Essential (primary) hypertension: Secondary | ICD-10-CM | POA: Diagnosis not present

## 2017-01-01 LAB — BASIC METABOLIC PANEL
BUN: 22 mg/dL (ref 6–23)
CALCIUM: 9.7 mg/dL (ref 8.4–10.5)
CHLORIDE: 107 meq/L (ref 96–112)
CO2: 26 meq/L (ref 19–32)
Creatinine, Ser: 1.43 mg/dL — ABNORMAL HIGH (ref 0.40–1.20)
GFR: 36.15 mL/min — ABNORMAL LOW (ref >60.00)
GLUCOSE: 98 mg/dL (ref 70–99)
Potassium: 4 mEq/L (ref 3.5–5.1)
SODIUM: 140 meq/L (ref 135–145)

## 2017-01-01 NOTE — Assessment & Plan Note (Signed)
Well-controlled. Continue to monitor. 

## 2017-01-01 NOTE — Assessment & Plan Note (Signed)
Due for recheck of hemoglobin.

## 2017-01-01 NOTE — Assessment & Plan Note (Signed)
Discussed options for treatment. Patient did not want to see a specialist. She preferred home exercises. If they are not beneficial consider physical therapy.

## 2017-01-01 NOTE — Patient Instructions (Addendum)
Nice to see you. Please do the following exercises for your shoulders. We will get you to see dermatology.   Shoulder Impingement Syndrome Rehab Ask your health care provider which exercises are safe for you. Do exercises exactly as told by your health care provider and adjust them as directed. It is normal to feel mild stretching, pulling, tightness, or discomfort as you do these exercises, but you should stop right away if you feel sudden pain or your pain gets worse.Do not begin these exercises until told by your health care provider. Stretching and range of motion exercise This exercise warms up your muscles and joints and improves the movement and flexibility of your shoulder. This exercise also helps to relieve pain and stiffness. Exercise A: Passive horizontal adduction  1. Sit or stand and pull your left / right elbow across your chest, toward your other shoulder. Stop when you feel a gentle stretch in the back of your shoulder and upper arm. ? Keep your arm at shoulder height. ? Keep your arm as close to your body as you comfortably can. 2. Hold for __________ seconds. 3. Slowly return to the starting position. Repeat __________ times. Complete this exercise __________ times a day. Strengthening exercises These exercises build strength and endurance in your shoulder. Endurance is the ability to use your muscles for a long time, even after they get tired. Exercise B: External rotation, isometric 1. Stand or sit in a doorway, facing the door frame. 2. Bend your left / right elbow and place the back of your wrist against the door frame. Only your wrist should be touching the frame. Keep your upper arm at your side. 3. Gently press your wrist against the door frame, as if you are trying to push your arm away from your abdomen. ? Avoid shrugging your shoulder while you press your hand against the door frame. Keep your shoulder blade tucked down toward the middle of your back. 4. Hold for  __________ seconds. 5. Slowly release the tension, and relax your muscles completely before you do the exercise again. Repeat __________ times. Complete this exercise __________ times a day. Exercise C: Internal rotation, isometric  1. Stand or sit in a doorway, facing the door frame. 2. Bend your left / right elbow and place the inside of your wrist against the door frame. Only your wrist should be touching the frame. Keep your upper arm at your side. 3. Gently press your wrist against the door frame, as if you are trying to push your arm toward your abdomen. ? Avoid shrugging your shoulder while you press your hand against the door frame. Keep your shoulder blade tucked down toward the middle of your back. 4. Hold for __________ seconds. 5. Slowly release the tension, and relax your muscles completely before you do the exercise again. Repeat __________ times. Complete this exercise __________ times a day. Exercise D: Scapular protraction, supine  1. Lie on your back on a firm surface. Hold a __________ weight in your left / right hand. 2. Raise your left / right arm straight into the air so your hand is directly above your shoulder joint. 3. Push the weight into the air so your shoulder lifts off of the surface that you are lying on. Do not move your head, neck, or back. 4. Hold for __________ seconds. 5. Slowly return to the starting position. Let your muscles relax completely before you repeat this exercise. Repeat __________ times. Complete this exercise __________ times a day. Exercise E:  Scapular retraction  1. Sit in a stable chair without armrests, or stand. 2. Secure an exercise band to a stable object in front of you so the band is at shoulder height. 3. Hold one end of the exercise band in each hand. Your palms should face down. 4. Squeeze your shoulder blades together and move your elbows slightly behind you. Do not shrug your shoulders while you do this. 5. Hold for __________  seconds. 6. Slowly return to the starting position. Repeat __________ times. Complete this exercise __________ times a day. Exercise F: Shoulder extension  1. Sit in a stable chair without armrests, or stand. 2. Secure an exercise band to a stable object in front of you where the band is above shoulder height. 3. Hold one end of the exercise band in each hand. 4. Straighten your elbows and lift your hands up to shoulder height. 5. Squeeze your shoulder blades together and pull your hands down to the sides of your thighs. Stop when your hands are straight down by your sides. Do not let your hands go behind your body. 6. Hold for __________ seconds. 7. Slowly return to the starting position. Repeat __________ times. Complete this exercise __________ times a day. This information is not intended to replace advice given to you by your health care provider. Make sure you discuss any questions you have with your health care provider. Document Released: 03/11/2005 Document Revised: 11/16/2015 Document Reviewed: 02/11/2015 Elsevier Interactive Patient Education  [employer].

## 2017-01-01 NOTE — Progress Notes (Signed)
Tommi Rumps, MD Phone: [phone]  Dawn Cooper is a 83 y.o. female who presents today for follow-up.  Patient has bilateral shoulder pain left greater than right. Has been going on couple weeks. No injury. Notes abduction hurts in the left shoulder. Slightly hurts in the right shoulder. She's been using icy hot with no benefit.  GERD: Taking Protonix. No reflux or blood in her stool.  On exam it was noted that she had a large skin lesion on her right lateral lower arm. This was covered by her sleeve and I noted it during the exam of her shoulders as I moved her arms around. She reports she tries to keep this area covered so that nobody can see this. She has not brought this up previously. She notes she would not have mentioned that if I had not felt the area. Picture will be inserted the chart. It has been there for years.  Patient is on aspirin and Plavix. Also metoprolol. She notes no chest pain or exertional dyspnea. Notes occasionally if she sits up when she is lying down and then lays down she has to take a few deeper breaths though otherwise no breathing issues. No numbness. No focal weakness. Notes occasionally her legs bilaterally feel little weak and this is chronic.  PMH: nonsmoker.   ROS see history of present illness  Objective  Physical Exam Vitals:   01/01/17 1343  BP: 124/90  Pulse: 77  Temp: 97.7 F (36.5 C)  SpO2: 99%    BP Readings from Last 3 Encounters:  01/01/17 124/90  09/23/16 124/80  08/30/16 124/84   Wt Readings from Last 3 Encounters:  01/01/17 137 lb (62.1 kg)  09/23/16 133 lb 1.9 oz (60.4 kg)  08/30/16 135 lb (61.2 kg)    Physical Exam  Constitutional: No distress.  Cardiovascular: Normal rate and regular rhythm.   Murmur (3/6 SEM RUSB) heard. Pulmonary/Chest: Effort normal and breath sounds normal.  Musculoskeletal: She exhibits no edema.  Full range of motion bilateral shoulders, no discomfort on passive range of motion,  discomfort in left shoulder on abduction actively, no other discomfort on active range of motion, no shoulder tenderness or asymmetry, positive empty can on the left  Neurological: She is alert. Gait normal.  Skin: Skin is warm and dry. She is not diaphoretic.       Assessment/Plan: Please see individual problem list.  HTN (hypertension) Well-controlled. Continue to monitor.  Anemia Due for recheck of hemoglobin.  History of ST elevation myocardial infarction Has done well recently. Asymptomatic. Continue current medications.  Atrial fibrillation ([place]) Rate controlled. Continue to monitor.  GERD (gastroesophageal reflux disease) Well-controlled. Continue to monitor.  Rotator cuff impingement syndrome of left shoulder Discussed options for treatment. Patient did not want to see a specialist. She preferred home exercises. If they are not beneficial consider physical therapy.  Skin lesion Concerning for basal cell carcinoma. Picture inserted into chart. I spoke with our referral coordinator to send an urgent referral to dermatology. Referring to [facility], [facility], [facility] dermatology in [place].   Orders Placed This Encounter  Procedures  . Flu vaccine HIGH DOSE PF  . CBC  . Basic Metabolic Panel (BMET)  . Ambulatory referral to Dermatology    Referral Priority:   Routine    Referral Type:   Consultation    Referral Reason:   Specialty Services Required    Requested Specialty:   Dermatology    Number of Visits Requested:   1    Tommi Rumps, MD [facility]  [place]

## 2017-01-01 NOTE — Assessment & Plan Note (Signed)
Concerning for basal cell carcinoma. Picture inserted into chart. I spoke with our referral coordinator to send an urgent referral to dermatology. Referring to [facility] dermatology in [place].

## 2017-01-01 NOTE — Assessment & Plan Note (Signed)
Rate controlled. Continue to monitor.

## 2017-01-01 NOTE — Assessment & Plan Note (Signed)
Has done well recently. Asymptomatic. Continue current medications.

## 2017-01-02 LAB — CBC
HEMATOCRIT: 40.7 % (ref 36.0–46.0)
Hemoglobin: 13.8 g/dL (ref 12.0–15.0)
MCHC: 33.8 g/dL (ref 30.0–36.0)
MCV: 95 fl (ref 78.0–100.0)
Platelets: 192 10*3/uL (ref 150.0–400.0)
RBC: 4.29 Mil/uL (ref 3.87–5.11)
RDW: 14.6 % (ref 11.5–15.5)
WBC: 7.4 10*3/uL (ref 4.0–10.5)

## 2017-01-07 DIAGNOSIS — C44612 Basal cell carcinoma of skin of right upper limb, including shoulder: Secondary | ICD-10-CM | POA: Diagnosis not present

## 2017-01-07 DIAGNOSIS — L988 Other specified disorders of the skin and subcutaneous tissue: Secondary | ICD-10-CM | POA: Diagnosis not present

## 2017-01-09 ENCOUNTER — Other Ambulatory Visit: Payer: Medicare Other

## 2017-01-14 ENCOUNTER — Ambulatory Visit (INDEPENDENT_AMBULATORY_CARE_PROVIDER_SITE_OTHER): Payer: Medicare Other | Admitting: *Deleted

## 2017-01-14 DIAGNOSIS — E538 Deficiency of other specified B group vitamins: Secondary | ICD-10-CM

## 2017-01-14 MED ORDER — CYANOCOBALAMIN 1000 MCG/ML IJ SOLN
1000.0000 ug | Freq: Once | INTRAMUSCULAR | Status: AC
Start: 2017-01-14 — End: 2017-01-14
  Administered 2017-01-14: 1000 ug via INTRAMUSCULAR

## 2017-01-14 NOTE — Progress Notes (Signed)
Patient presented for B 12 injection to right deltoid, patient voiced no concerns nor showed any signs of distress during injection. 

## 2017-01-14 NOTE — Progress Notes (Signed)
I have reviewed the above note and agree.  Kmarion Rawl, M.D.  

## 2017-02-04 DIAGNOSIS — C44612 Basal cell carcinoma of skin of right upper limb, including shoulder: Secondary | ICD-10-CM | POA: Diagnosis not present

## 2017-02-05 ENCOUNTER — Other Ambulatory Visit: Payer: Self-pay | Admitting: Cardiovascular Disease

## 2017-02-18 ENCOUNTER — Ambulatory Visit: Payer: Medicare Other

## 2017-02-18 DIAGNOSIS — T8130XA Disruption of wound, unspecified, initial encounter: Secondary | ICD-10-CM | POA: Diagnosis not present

## 2017-02-19 ENCOUNTER — Ambulatory Visit (INDEPENDENT_AMBULATORY_CARE_PROVIDER_SITE_OTHER): Payer: Medicare Other

## 2017-02-19 VITALS — BP 118/70 | HR 69 | Temp 97.6°F | Resp 14 | Ht 59.0 in | Wt 140.8 lb

## 2017-02-19 DIAGNOSIS — Z0000 Encounter for general adult medical examination without abnormal findings: Secondary | ICD-10-CM | POA: Diagnosis not present

## 2017-02-19 DIAGNOSIS — E538 Deficiency of other specified B group vitamins: Secondary | ICD-10-CM

## 2017-02-19 DIAGNOSIS — Z1331 Encounter for screening for depression: Secondary | ICD-10-CM | POA: Diagnosis not present

## 2017-02-19 MED ORDER — CYANOCOBALAMIN 1000 MCG/ML IJ SOLN
1000.0000 ug | Freq: Once | INTRAMUSCULAR | Status: AC
  Administered 2017-02-19: 1000 ug via INTRAMUSCULAR

## 2017-02-19 NOTE — Patient Instructions (Addendum)
  Dawn Cooper , Thank you for taking time to come for your Medicare Wellness Visit. I appreciate your ongoing commitment to your health goals. Please review the following plan we discussed and let me know if I can assist you in the future.   These are the goals we discussed: Goals    . Healthy Lifestyle     Stay hydrated Walk for exercise Healthy diet       This is a list of the screening recommended for you and due dates:  Health Maintenance  Topic Date Due  . Tetanus Vaccine  10[birth year]17/1939  . DEXA scan (bone density measurement)  10/16/1985  . Flu Shot  Completed  . Pneumonia vaccines  Completed

## 2017-02-19 NOTE — Progress Notes (Signed)
Subjective:   Dawn Cooper is a 83 y.o. female who presents for Medicare Annual (Subsequent) preventive examination.  Review of Systems:  No ROS.  Medicare Wellness Visit. Additional risk factors are reflected in the social history.  Cardiac Risk Factors include: advanced age (>29men, >18 women);hypertension     Objective:     Vitals: BP 118/70 (BP Location: Left Arm, Patient Position: Sitting, Cuff Size: Normal)   Pulse 69   Temp 97.6 F (36.4 C) (Oral)   Resp 14   Ht 4\' 11"  (1.499 m)   Wt 140 lb 12.8 oz (63.9 kg)   SpO2 99%   BMI 28.44 kg/m   Body mass index is 28.44 kg/m.   Tobacco Social History   Tobacco Use  Smoking Status Never Smoker  Smokeless Tobacco Never Used     Counseling given: Not Answered   Past Medical History:  Diagnosis Date  . A-fib ([place])   . Broken leg    right  . Cardiomyopathy   . Chronic kidney disease   . GERD (gastroesophageal reflux disease)   . Heart murmur   . Hiatal hernia   . Hypertension   . Mild mitral regurgitation by prior echocardiogram   . Moderate aortic stenosis   . Stroke ([place])   . Tricuspid regurgitation    Past Surgical History:  Procedure Laterality Date  . APPENDECTOMY    . CATARACT EXTRACTION  2013   left  . CHOLECYSTECTOMY    . CORONARY ANGIOGRAPHY N/A 08/09/2016   Procedure: Coronary Angiography;  Surgeon: Burnell Blanks, MD;  Location: [facility];  Service: Cardiovascular;  Laterality: N/A;  . CORONARY STENT INTERVENTION N/A 08/09/2016   Procedure: Coronary Stent Intervention;  Surgeon: Burnell Blanks, MD;  Location: [facility];  Service: Cardiovascular;  Laterality: N/A;  . FEMUR FRACTURE SURGERY  12/2011   Dr. Sabra Heck, s/p rehab at [facility]  . GALLBLADDER SURGERY    . IR RADIOLOGY PERIPHERAL GUIDED IV START  07/29/2016  . IR US GUIDE VASC ACCESS RIGHT  07/29/2016  . VAGINAL DELIVERY     Family History  Problem Relation Age of Onset  . Stroke Sister   . Cancer  Brother        pancreatic  . Cancer Sister        pancreatic  . Diabetes Unknown   . Diabetes Brother   . Heart attack Neg Hx    Social History   Substance and Sexual Activity  Sexual Activity No    Outpatient Encounter Medications as of 02/19/2017  Medication Sig  . aspirin EC 81 MG tablet Take 1 tablet (81 mg total) by mouth daily.  . clopidogrel (PLAVIX) 75 MG tablet Take 1 tablet (75 mg total) by mouth every other day.  . furosemide (LASIX) 20 MG tablet TAKE 1 TABLET EVERY OTHER DAY.  . metoprolol tartrate (LOPRESSOR) 25 MG tablet TAKE 1/2 TABLET TWICE DAILY  . nitroGLYCERIN (NITROSTAT) 0.4 MG SL tablet Place 1 tablet (0.4 mg total) under the tongue every 5 (five) minutes x 3 doses as needed for chest pain.  . pantoprazole (PROTONIX) 40 MG tablet Take 1 tablet (40 mg total) by mouth daily.  . potassium chloride (K-DUR,KLOR-CON) 10 MEQ tablet Take 1 tablet (10 mEq total) by mouth every other day.  . [EXPIRED] cyanocobalamin ((VITAMIN B-12)) injection 1,000 mcg    No facility-administered encounter medications on file as of 02/19/2017.     Activities of Daily Living In your present state of  health, do you have any difficulty performing the following activities: 02/19/2017 08/13/2016  Hearing? N -  Vision? N -  Difficulty concentrating or making decisions? Y -  Comment Difficulty remembering; age appropriate -  Walking or climbing stairs? Y -  Comment Unsteady gait -  Dressing or bathing? N -  Doing errands, shopping? Y N  Comment She does not drive Facilities manager and eating ? N -  Using the Toilet? N -  In the past six months, have you accidently leaked urine? N -  Do you have problems with loss of bowel control? N -  Managing your Medications? Y -  Comment Daughter assists as needed -  Managing your Finances? Y -  Comment Daughter assists -  Housekeeping or managing your Housekeeping? N -  Comment Daughter assists as needed -  Some recent data might be hidden     Patient Care Team: Leone Haven, MD as PCP - General (Family Medicine) Minna Merritts, MD as Consulting Physician (Cardiology) Lollie Sails, MD as Physician Assistant (Internal Medicine)    Assessment:    This is a routine wellness examination for [facility]. The goal of the wellness visit is to assist the patient how to close the gaps in care and create a preventative care plan for the patient.   The roster of all physicians providing medical care to patient is listed in the Snapshot section of the chart.  Osteoporosis risk reviewed.    Safety issues reviewed; Smoke and carbon monoxide detectors in the home. No firearms in the home.  Wears seatbelts when riding with others. Patient does wear sunscreen or protective clothing when in direct sunlight. No violence in the home.  Depression- PHQ 2 &9 complete.  No signs/symptoms or verbal communication regarding little pleasure in doing things, feeling down, depressed or hopeless. No changes in sleeping, energy, eating, concentrating.  No thoughts of self harm or harm towards others.  Time spent on this topic is 8 minutes.   Patient is alert, normal appearance, oriented to person/place/and time.  Did not correctly identify the president of the [place]. Recall of 2/3 words, and counted backwards from 20 to 1.  Displays appropriate judgement and can read correct time from watch face.   No new identified risk were noted.  No failures at ADL's or IADL's.   BMI- discussed the importance of a healthy diet, water intake and the benefits of aerobic exercise. Educational material provided.   24 hour diet recall: Breakfast: toast, jelly  Lunch: chicken nuggets, apples Dinner: vegetable soup  Daily fluid intake: 1 cups of caffeine, 4 cups of water  Dental- dentures  Eye- Visual acuity not assessed per patient preference since they have regular follow up with the ophthalmologist.  Wears corrective lenses.  Annual exam scheduled  next month.  Sleep patterns- Sleeps 4-6 hours at night.  Wakes feeling rested.  Monthly B12 injection administered per PCP order, L deltoid.  Tolerated well.   TDAP vaccine deferred per patient preference.  Follow up with insurance.  Educational material provided.  Dexa Scan discussed.  Patient Concerns: None at this time. Follow up with PCP as needed.  Exercise Activities and Dietary recommendations Current Exercise Habits: Home exercise routine, Type of exercise: stretching(Purposefully walks around the home.  Chair/bed exercises.), Time (Minutes): 10, Frequency (Times/Week): 3, Weekly Exercise (Minutes/Week): 30, Intensity: Mild  Goals    . Healthy Lifestyle     Stay hydrated Walk for exercise Healthy diet  Fall Risk Fall Risk  02/19/2017 04/01/2016 02/20/2016 06/14/2013  Falls in the past year? No No Yes No  Number falls in past yr: - - 2 or more -  Follow up - - Education provided;Falls prevention discussed -   Depression Screen PHQ 2/9 Scores 02/19/2017 02/20/2016 06/14/2013  PHQ - 2 Score 0 1 0  PHQ- 9 Score 1 - -     Cognitive Function     6CIT Screen 02/19/2017 02/20/2016  What Year? 4 points 0 points  What month? 0 points 0 points  What time? 0 points 0 points  Count back from 20 0 points 0 points  Months in reverse 0 points 0 points  Repeat phrase 0 points -  Total Score 4 -    Immunization History  Administered Date(s) Administered  . Influenza, High Dose Seasonal PF 12/12/2015, 01/01/2017  . Influenza,inj,Quad PF,6+ Mos 02/11/2013, 12/17/2013, 12/20/2014  . Influenza-Unspecified 12/25/2011  . Pneumococcal Conjugate-13 03/22/2014  . Pneumococcal Polysaccharide-23 12/25/2011   Screening Tests Health Maintenance  Topic Date Due  . TETANUS/TDAP  10/17/1939  . DEXA SCAN  10/16/1985  . INFLUENZA VACCINE  Completed  . PNA vac Low Risk Adult  Completed      Plan:   End of life planning; Advanced aging; Advanced directives discussed.  No  HCPOA/Living Will.  Additional information provided to help them start the conversation with family.  Copy of HCPOA/Living Will requested upon completion. Time spent on this topic is 19 minutes.  I have personally reviewed and noted the following in the patient's chart:   . Medical and social history . Use of alcohol, tobacco or illicit drugs  . Current medications and supplements . Functional ability and status . Nutritional status . Physical activity . Advanced directives . List of other physicians . Hospitalizations, surgeries, and ER visits in previous 12 months . Vitals . Screenings to include cognitive, depression, and falls . Referrals and appointments  In addition, I have reviewed and discussed with patient certain preventive protocols, quality metrics, and best practice recommendations. A written personalized care plan for preventive services as well as general preventive health recommendations were provided to patient.     Varney Biles, LPN  [id number]

## 2017-02-25 DIAGNOSIS — T8130XA Disruption of wound, unspecified, initial encounter: Secondary | ICD-10-CM | POA: Diagnosis not present

## 2017-03-12 ENCOUNTER — Other Ambulatory Visit: Payer: Self-pay | Admitting: Cardiovascular Disease

## 2017-03-12 DIAGNOSIS — T8130XA Disruption of wound, unspecified, initial encounter: Secondary | ICD-10-CM | POA: Diagnosis not present

## 2017-03-26 ENCOUNTER — Ambulatory Visit (INDEPENDENT_AMBULATORY_CARE_PROVIDER_SITE_OTHER): Payer: Medicare Other | Admitting: *Deleted

## 2017-03-26 DIAGNOSIS — E538 Deficiency of other specified B group vitamins: Secondary | ICD-10-CM

## 2017-03-26 MED ORDER — CYANOCOBALAMIN 1000 MCG/ML IJ SOLN
1000.0000 ug | Freq: Once | INTRAMUSCULAR | Status: AC
  Administered 2017-03-26: 1000 ug via INTRAMUSCULAR

## 2017-03-26 NOTE — Progress Notes (Signed)
Patient presented for B 12 injection to right deltoid, patient voiced no concerns nor showed any signs of distress during injection. 

## 2017-03-26 NOTE — Progress Notes (Signed)
I have reviewed the above note and agree.  Adham Johnson, M.D.  

## 2017-04-21 ENCOUNTER — Other Ambulatory Visit: Payer: Self-pay | Admitting: Family Medicine

## 2017-04-28 ENCOUNTER — Ambulatory Visit: Payer: Medicare Other | Admitting: Cardiovascular Disease

## 2017-04-30 ENCOUNTER — Ambulatory Visit (INDEPENDENT_AMBULATORY_CARE_PROVIDER_SITE_OTHER): Payer: Medicare Other | Admitting: *Deleted

## 2017-04-30 DIAGNOSIS — E538 Deficiency of other specified B group vitamins: Secondary | ICD-10-CM

## 2017-04-30 MED ORDER — CYANOCOBALAMIN 1000 MCG/ML IJ SOLN
1000.0000 ug | Freq: Once | INTRAMUSCULAR | Status: AC
  Administered 2017-04-30: 1000 ug via INTRAMUSCULAR

## 2017-04-30 NOTE — Progress Notes (Signed)
Patient presented for B 12 injection to left deltoid, patient voiced no concerns nor showed any signs of distress during injection. 

## 2017-04-30 NOTE — Progress Notes (Signed)
I have reviewed the above note and agree.  Mikeya Tomasetti, M.D.  

## 2017-05-01 NOTE — Progress Notes (Signed)
Cardiology Office Note  Date:  05/05/2017   ID:  Abbigail Anstey, DOB 03/19/1919, MRN [id number]  PCP:  Leone Haven, MD   Chief Complaint  Patient presents with  . other    6 month follow up. Meds reviewed by the pt. verbally. Pt. c/o weakness.     HPI:  Dawn Cooper is a very pleasant 84 year old woman with  severe aortic valve stenosis,   atrial fibrillation (not on anticoagulation secondary to GI bleed) ,  GI bleed from AVM,  stroke June 2016, documented on MRI,  who presents for follow-up of her aortic valve stenosis , chronic atrial fibrillation, hx of STEMI  STEMI 08/09/2016  Ost 1st Diag lesion, 70 %stenosed.  A STENT SYNERGY DES 2.5X16 drug eluting stent was successfully placed.  Prox LAD lesion, 100 %stenosed.  Post intervention, there is a 0% residual stenosis.  1. Acute anterolateral STEMI secondary to thrombotic occlusion of known severe mid LAD and diagonal stenosis.  Successful PTCA/DES x 1 mid LAD  Echocardiogram September 23, 2016 LV function remains severely depressed with a large anterolateral/apical wall motion abnormality. The anteroseptal wall and apex are akinetic  Echo 08/10/2016 EF 25 to 30%  Ambulating with a cane no regular No regular exercise Spending most of her time in the house Feels somewhat depressed with the gloomy weather Family nearby Denies significant shortness of breath She does have occasional dizziness if she stands up too quickly, will only last a second  no near syncope or syncope.  no leg swelling  EKG on today's visit shows atrial fibrillation with ventricular rate 78 bpm, left bundle branch block, no change from previous EKG   Other past medical history reviewed  presented to the hospital on January 02 2011 with atrial fibrillation with RVR, elevated BNP, CT scan showing bilateral moderate sized pleural effusions who was started on rate control and pradaxa.  She refused cardioversion after 4 weeks on anticoagulation. At  approximately 6 weeks out, she had a AVM bleed and presented to the hospital with melena and hematocrit of 18. She had been having melena for at least one week and did not go to the hospital. She had EGD with treatment of her AVM. All anticoagulation was held.   We had discussed the case with Dr. Gustavo Lah (GI) and it was felt that given her risk of further bleeding from additional AVMs and given her age and the extent of her recent bleed, that no further anticoagulation be used.    on aspirin and Plavix , does not want stronger blood thinners as she is doing well  Previous EKG showing atrial fibrillation with left bundle branch block, rate in the 69s  admitted to the hospital 09/10/2014 for stroke, acute stroke on the left On MRI She recovered well despite initial slurred speech  Last echocardiogram 2/ 2016  hospitalization at the end of February 2016 with stroke type symptoms, stroke on MRI Family had reported a mechanical fall, imaging showed pubic ramus fracture, day prior to admission she was found to be unresponsive and would not answer verbal commands. She became more alert during the daytime, slurred speech. They brought her to the hospital 05/19/2014. She is lethargic, bradycardia, hypotension, given IV fluids with improvement of her symptoms. MRI showing multiple strokes, old Felt to be a high risk bleeding candidate, started on Plavix  Echocardiogram in the hospital showed ejection fraction 70%, moderate to severe aortic valve stenosis, moderate MR Carotid ultrasound 05/20/2014 showing moderate bilateral carotid arterial disease worse  on the left  fall in November 2013. She broke her leg and had surgery with a rod placed on the right.   Echocardiogram while she was in atrial fibrillation showed ejection fraction 35-45%, normal left ventricular systolic pressures, moderate aortic valve stenosis with mean gradient of 32 mmHg  Previous lab work: total cholesterol 132,  LDL 82, HDL 40, triglycerides 502, hemoglobin A1c 4.9  Previous CT Scan of the chest showing moderate bilateral pleural effusions with compressive atelectasis in both lower lobes, no PE   PMH:   has a past medical history of A-fib ([place]), Broken leg, Cardiomyopathy, Chronic kidney disease, GERD (gastroesophageal reflux disease), Heart murmur, Hiatal hernia, Hypertension, Mild mitral regurgitation by prior echocardiogram, Moderate aortic stenosis, Stroke ([place]), and Tricuspid regurgitation.  PSH:    Past Surgical History:  Procedure Laterality Date  . APPENDECTOMY    . CATARACT EXTRACTION  2013   left  . CHOLECYSTECTOMY    . CORONARY ANGIOGRAPHY N/A 08/09/2016   Procedure: Coronary Angiography;  Surgeon: Burnell Blanks, MD;  Location: [facility];  Service: Cardiovascular;  Laterality: N/A;  . CORONARY STENT INTERVENTION N/A 08/09/2016   Procedure: Coronary Stent Intervention;  Surgeon: Burnell Blanks, MD;  Location: [facility];  Service: Cardiovascular;  Laterality: N/A;  . FEMUR FRACTURE SURGERY  12/2011   Dr. Sabra Heck, s/p rehab at [facility]  . GALLBLADDER SURGERY    . IR RADIOLOGY PERIPHERAL GUIDED IV START  07/29/2016  . IR US GUIDE VASC ACCESS RIGHT  07/29/2016  . VAGINAL DELIVERY      Current Outpatient Medications  Medication Sig Dispense Refill  . aspirin EC 81 MG tablet Take 1 tablet (81 mg total) by mouth daily. 30 tablet 0  . clopidogrel (PLAVIX) 75 MG tablet Take 1 tablet (75 mg total) by mouth every other day. 90 tablet 1  . furosemide (LASIX) 20 MG tablet TAKE 1 TABLET EVERY OTHER DAY. 45 tablet 3  . metoprolol tartrate (LOPRESSOR) 25 MG tablet TAKE 1/2 TABLET TWICE DAILY 90 tablet 2  . nitroGLYCERIN (NITROSTAT) 0.4 MG SL tablet Place 1 tablet (0.4 mg total) under the tongue every 5 (five) minutes x 3 doses as needed for chest pain. 25 tablet 12  . pantoprazole (PROTONIX) 40 MG tablet TAKE 1 TABLET EVERY DAY 90 tablet 1  . potassium chloride  (K-DUR,KLOR-CON) 10 MEQ tablet TAKE 1 TABLET EVERY OTHER DAY 45 tablet 3   No current facility-administered medications for this visit.      Allergies:   Dabigatran; Dabigatran etexilate mesylate; Clonidine; Clonidine derivatives; and Penicillins   Social History:  The patient  reports that  has never smoked. she has never used smokeless tobacco. She reports that she does not drink alcohol or use drugs.   Family History:   family history includes Cancer in her brother and sister; Diabetes in her brother and unknown relative; Stroke in her sister.    Review of Systems: Review of Systems  Constitutional: Negative.   Respiratory: Negative.   Cardiovascular: Negative.   Gastrointestinal: Negative.   Musculoskeletal: Negative.        Leg weakness  Neurological: Negative.   Psychiatric/Behavioral: Negative.   All other systems reviewed and are negative.    PHYSICAL EXAM: VS:  BP 128/89 (BP Location: Left Arm, Patient Position: Sitting, Cuff Size: Normal)   Pulse 78   Ht 4\' 11"  (1.499 m)   Wt 139 lb 12 oz (63.4 kg)   BMI 28.23 kg/m  , BMI Body  mass index is 28.23 kg/m. GEN: Well nourished, well developed, in no acute distress , walks with a walker HEENT: normal  Neck: no JVD, carotid bruits, or masses Cardiac: RRR; 3/6 SEM RSB,  no rubs, or gallops,no edema  Respiratory:  clear to auscultation bilaterally, normal work of breathing GI: soft, nontender, nondistended, + BS MS: no deformity or atrophy  Skin: warm and dry, no rash Neuro:  Strength and sensation are intact Psych: euthymic mood, full affect    Recent Labs: 08/09/2016: ALT 10 01/01/2017: BUN 22; Creatinine, Ser 1.43; Hemoglobin 13.8; Platelets 192.0; Potassium 4.0; Sodium 140    Lipid Panel Lab Results  Component Value Date   CHOL 154 08/09/2016   HDL 33 (L) 08/09/2016   LDLCALC 102 (H) 08/09/2016   TRIG 94 08/09/2016      Wt Readings from Last 3 Encounters:  05/05/17 139 lb 12 oz (63.4 kg)  02/19/17  140 lb 12.8 oz (63.9 kg)  01/01/17 137 lb (62.1 kg)       ASSESSMENT AND PLAN:  Chronic atrial fibrillation (HCC) -   history of strokes.    she has declined warfarin or other more aggressive anticoagulation for atrial fibrillation. She is aware of risk of stroke on her current regimen. She is concerned about recurrent GI bleeding.  Discussed again today with her   Cerebrovascular accident (CVA) due to embolism of precerebral artery ([place])  Documented on MRI.   declined aggressive anticoagulation for atrial fibrillation given history of GI bleeding High fall risk and patient has declined  Essential hypertension - Plan: EKG 12-Lead, ECHOCARDIOGRAM COMPLETE Blood pressure is well controlled on today's visit. No changes made to the medications. Stable  Aortic valve stenosis, severe -  After STEMI, felt to be not a candidate for TAVR Last seen July 2018 Currently asymptomatic  CKD (chronic kidney disease), stage III  stable renal function,   STEMI Mid LAD stent, recovered well On aspirin Plavix   Total encounter time more than 25 minutes  Greater than 50% was spent in counseling and coordination of care with the patient   Disposition:   F/U  6 months   Orders Placed This Encounter  Procedures  . EKG 12-Lead     Signed, Esmond Plants, M.D., Ph.D. 05/05/2017  [place], [place]

## 2017-05-05 ENCOUNTER — Encounter: Payer: Self-pay | Admitting: Cardiovascular Disease

## 2017-05-05 ENCOUNTER — Ambulatory Visit (INDEPENDENT_AMBULATORY_CARE_PROVIDER_SITE_OTHER): Payer: Medicare Other | Admitting: Cardiovascular Disease

## 2017-05-05 VITALS — BP 128/89 | HR 78 | Ht 59.0 in | Wt 139.8 lb

## 2017-05-05 DIAGNOSIS — N183 Chronic kidney disease, stage 3 unspecified: Secondary | ICD-10-CM

## 2017-05-05 DIAGNOSIS — I25118 Atherosclerotic heart disease of native coronary artery with other forms of angina pectoris: Secondary | ICD-10-CM

## 2017-05-05 DIAGNOSIS — I6310 Cerebral infarction due to embolism of unspecified precerebral artery: Secondary | ICD-10-CM

## 2017-05-05 DIAGNOSIS — I482 Chronic atrial fibrillation, unspecified: Secondary | ICD-10-CM

## 2017-05-05 DIAGNOSIS — I209 Angina pectoris, unspecified: Secondary | ICD-10-CM | POA: Diagnosis not present

## 2017-05-05 DIAGNOSIS — I10 Essential (primary) hypertension: Secondary | ICD-10-CM

## 2017-05-05 DIAGNOSIS — I350 Nonrheumatic aortic (valve) stenosis: Secondary | ICD-10-CM | POA: Diagnosis not present

## 2017-05-05 NOTE — Patient Instructions (Signed)

## 2017-06-03 ENCOUNTER — Ambulatory Visit (INDEPENDENT_AMBULATORY_CARE_PROVIDER_SITE_OTHER): Payer: Medicare Other

## 2017-06-03 DIAGNOSIS — E538 Deficiency of other specified B group vitamins: Secondary | ICD-10-CM

## 2017-06-03 MED ORDER — CYANOCOBALAMIN 1000 MCG/ML IJ SOLN
1000.0000 ug | Freq: Once | INTRAMUSCULAR | Status: AC
  Administered 2017-06-03: 1000 ug via INTRAMUSCULAR

## 2017-06-03 NOTE — Progress Notes (Signed)
Patient presented today for B12 injection. Left deltoid per pt request. Patient voiced no concerns nor showed any signs of distress during injection.

## 2017-06-03 NOTE — Progress Notes (Signed)
I have reviewed the above note and agree.  Zarinah Oviatt, M.D.  

## 2017-07-07 ENCOUNTER — Other Ambulatory Visit: Payer: Self-pay | Admitting: Family Medicine

## 2017-07-08 ENCOUNTER — Ambulatory Visit (INDEPENDENT_AMBULATORY_CARE_PROVIDER_SITE_OTHER): Payer: Medicare Other | Admitting: *Deleted

## 2017-07-08 DIAGNOSIS — E538 Deficiency of other specified B group vitamins: Secondary | ICD-10-CM

## 2017-07-08 MED ORDER — CYANOCOBALAMIN 1000 MCG/ML IJ SOLN
1000.0000 ug | Freq: Once | INTRAMUSCULAR | Status: AC
  Administered 2017-07-08: 1000 ug via INTRAMUSCULAR

## 2017-07-08 NOTE — Progress Notes (Signed)
Patient presented for B 12 injection to right deltoid, patient voiced no concerns nor showed any signs of distress during injection. 

## 2017-08-12 ENCOUNTER — Ambulatory Visit (INDEPENDENT_AMBULATORY_CARE_PROVIDER_SITE_OTHER): Payer: Medicare Other | Admitting: *Deleted

## 2017-08-12 DIAGNOSIS — E538 Deficiency of other specified B group vitamins: Secondary | ICD-10-CM

## 2017-08-12 MED ORDER — CYANOCOBALAMIN 1000 MCG/ML IJ SOLN
1000.0000 ug | Freq: Once | INTRAMUSCULAR | Status: AC
  Administered 2017-08-12: 1000 ug via INTRAMUSCULAR

## 2017-08-12 NOTE — Progress Notes (Signed)
Patient presented for B 12 injection to left deltoid, patient voiced no concerns nor showed any signs of distress during injection. 

## 2017-09-10 ENCOUNTER — Other Ambulatory Visit: Payer: Self-pay | Admitting: Cardiovascular Disease

## 2017-09-15 ENCOUNTER — Telehealth: Payer: Self-pay | Admitting: Family Medicine

## 2017-09-15 ENCOUNTER — Encounter: Payer: Self-pay | Admitting: Family Medicine

## 2017-09-15 ENCOUNTER — Ambulatory Visit (INDEPENDENT_AMBULATORY_CARE_PROVIDER_SITE_OTHER): Payer: Medicare Other | Admitting: Family Medicine

## 2017-09-15 VITALS — BP 140/90 | HR 72 | Temp 97.7°F | Ht 59.0 in | Wt 140.0 lb

## 2017-09-15 DIAGNOSIS — Z8673 Personal history of transient ischemic attack (TIA), and cerebral infarction without residual deficits: Secondary | ICD-10-CM

## 2017-09-15 DIAGNOSIS — I482 Chronic atrial fibrillation, unspecified: Secondary | ICD-10-CM

## 2017-09-15 DIAGNOSIS — I10 Essential (primary) hypertension: Secondary | ICD-10-CM

## 2017-09-15 DIAGNOSIS — R531 Weakness: Secondary | ICD-10-CM | POA: Diagnosis not present

## 2017-09-15 DIAGNOSIS — I6310 Cerebral infarction due to embolism of unspecified precerebral artery: Secondary | ICD-10-CM

## 2017-09-15 DIAGNOSIS — F0390 Unspecified dementia without behavioral disturbance: Secondary | ICD-10-CM | POA: Insufficient documentation

## 2017-09-15 DIAGNOSIS — L989 Disorder of the skin and subcutaneous tissue, unspecified: Secondary | ICD-10-CM | POA: Diagnosis not present

## 2017-09-15 DIAGNOSIS — R945 Abnormal results of liver function studies: Principal | ICD-10-CM

## 2017-09-15 DIAGNOSIS — R7989 Other specified abnormal findings of blood chemistry: Secondary | ICD-10-CM

## 2017-09-15 DIAGNOSIS — E538 Deficiency of other specified B group vitamins: Secondary | ICD-10-CM

## 2017-09-15 LAB — COMPREHENSIVE METABOLIC PANEL
ALT: 82 U/L — ABNORMAL HIGH (ref 0–35)
AST: 114 U/L — AB (ref 0–37)
Albumin: 3.4 g/dL — ABNORMAL LOW (ref 3.5–5.2)
Alkaline Phosphatase: 321 U/L — ABNORMAL HIGH (ref 39–117)
BUN: 19 mg/dL (ref 6–23)
CHLORIDE: 105 meq/L (ref 96–112)
CO2: 23 mEq/L (ref 19–32)
CREATININE: 1.62 mg/dL — AB (ref 0.40–1.20)
Calcium: 9.3 mg/dL (ref 8.4–10.5)
GFR: 31.25 mL/min — ABNORMAL LOW (ref >60.00)
GLUCOSE: 152 mg/dL — AB (ref 70–99)
Potassium: 3.8 mEq/L (ref 3.5–5.1)
SODIUM: 138 meq/L (ref 135–145)
TOTAL PROTEIN: 6.9 g/dL (ref 6.0–8.3)
Total Bilirubin: 5.3 mg/dL — ABNORMAL HIGH (ref 0.2–1.2)

## 2017-09-15 LAB — TSH: TSH: 3.26 u[IU]/mL (ref 0.35–4.50)

## 2017-09-15 LAB — VITAMIN B12: Vitamin B-12: >1500 pg/mL — ABNORMAL HIGH (ref 211–911)

## 2017-09-15 LAB — CBC
HCT: 37.8 % (ref 36.0–46.0)
HEMOGLOBIN: 13 g/dL (ref 12.0–15.0)
MCHC: 34.3 g/dL (ref 30.0–36.0)
MCV: 95.7 fl (ref 78.0–100.0)
Platelets: 223 10*3/uL (ref 150.0–400.0)
RBC: 3.95 Mil/uL (ref 3.87–5.11)
RDW: 14.6 % (ref 11.5–15.5)
WBC: 6.6 10*3/uL (ref 4.0–10.5)

## 2017-09-15 MED ORDER — CYANOCOBALAMIN 1000 MCG/ML IJ SOLN
1000.0000 ug | Freq: Once | INTRAMUSCULAR | Status: AC
  Administered 2017-09-15: 1000 ug via INTRAMUSCULAR

## 2017-09-15 NOTE — Assessment & Plan Note (Signed)
1/5 on mini cog testing.  Potentially with some level of dementia or age-related cognitive decline.  Could be vascular dementia given chronic microvascular ischemic changes noted on prior MRI.  We will check a TSH and B12.

## 2017-09-15 NOTE — Assessment & Plan Note (Signed)
Status post removal by dermatology.

## 2017-09-15 NOTE — Telephone Encounter (Signed)
I spoke with the patient and her daughter regarding her lab results.  Bilirubin is elevated as well as AST and ALT and alkaline phosphatase.  The patient reports one episode of abdominal discomfort that lasted very briefly sometime in the last week.  She notes no discomfort at this time.  She reports her urine is normal in color and her stool is normal in color.  Her daughter reports her urine has been a little darker though does not know her stool color.  I discussed that the things to worry about with these findings would be something with her biliary tract versus her pancreas versus her liver.  We will obtain an ultrasound to start the evaluation.  This has been ordered stat to be performed tomorrow.  I will forward this to our referral coordinator to get this set up.  The patient's daughter is aware that somebody will be contacting her tomorrow to arrange this.

## 2017-09-15 NOTE — Progress Notes (Signed)
  Tommi Rumps, MD Phone: [phone]  Dawn Cooper is a 84 y.o. female who presents today for f/u  CC: htn, afib, memory, weakness  HYPERTENSION  Disease Monitoring  Home BP Monitoring not checking Chest pain- no    Dyspnea- no Medications  Compliance-  Taking metoprolol, lasix qod.  Edema- no  A. fib: Taking Plavix and aspirin.  No palpitations.  No bleeding issues.  History of CVA: She notes no new numbness or focal weakness.  Patient reports overall she feels generally weak.  This has been going on for a number of months.  She was not able get much activity while it was cold and then it got hot and she has not been able to get outside.  She notes no focal symptoms.  They report some issues with memory.  It is mostly short-term memory issues.  She still bathes herself, dresses herself, feeds herself, and goes to the bathroom by herself.  She does have some help with her finances.  She does not drive anymore.    Social History   Tobacco Use  Smoking Status Never Smoker  Smokeless Tobacco Never Used     ROS see history of present illness  Objective  Physical Exam Vitals:   09/15/17 1127  BP: 140/90  Pulse: 72  Temp: 97.7 F (36.5 C)  SpO2: 98%    BP Readings from Last 3 Encounters:  09/15/17 140/90  05/05/17 128/89  02/19/17 118/70   Wt Readings from Last 3 Encounters:  09/15/17 140 lb (63.5 kg)  05/05/17 139 lb 12 oz (63.4 kg)  02/19/17 140 lb 12.8 oz (63.9 kg)    Physical Exam  Constitutional: No distress.  Cardiovascular: Normal rate and normal heart sounds. An irregularly irregular rhythm present.  Pulmonary/Chest: Effort normal and breath sounds normal.  Musculoskeletal: She exhibits no edema.  Neurological: She is alert.  CN 2-12 intact, 5/5 strength in bilateral biceps, triceps, grip, quads, hamstrings, plantar and dorsiflexion, sensation to light touch intact in bilateral UE and LE, patient is able to get on the exam table with no  difficulty  Skin: Skin is warm and dry. She is not diaphoretic.  Area on her right upper arm appears well-healing after procedure through dermatology   Assessment/Plan: Please see individual problem list.  Atrial fibrillation ([place]) Rate controlled.  She will continue with her current regimen.  HTN (hypertension) Adequately controlled for age.  Check CMP.  Continue current regimen.  History of stroke No new focal deficits.  She will continue aspirin and Plavix.  Skin lesion Status post removal by dermatology.  B12 deficiency B12 given today.  Check level.  Generalized weakness Reports some generalized weakness.  I suspect this is related to her not having been active.  No focal neurological issues.  No cardiac symptoms.  Offered physical therapy though she declined.  She will try to increase her activity level.  Dementia without behavioral disturbance 1/5 on mini cog testing.  Potentially with some level of dementia or age-related cognitive decline.  Could be vascular dementia given chronic microvascular ischemic changes noted on prior MRI.  We will check a TSH and B12.   Orders Placed This Encounter  Procedures  . CBC  . Comp Met (CMET)  . TSH  . B12    Meds ordered this encounter  Medications  . cyanocobalamin ((VITAMIN B-12)) injection 1,000 mcg     Tommi Rumps, MD [place]

## 2017-09-15 NOTE — Assessment & Plan Note (Signed)
No new focal deficits.  She will continue aspirin and Plavix.

## 2017-09-15 NOTE — Assessment & Plan Note (Signed)
Adequately controlled for age.  Check CMP.  Continue current regimen.

## 2017-09-15 NOTE — Assessment & Plan Note (Addendum)
Rate controlled.  She will continue with her current regimen.

## 2017-09-15 NOTE — Assessment & Plan Note (Signed)
Reports some generalized weakness.  I suspect this is related to her not having been active.  No focal neurological issues.  No cardiac symptoms.  Offered physical therapy though she declined.  She will try to increase her activity level.

## 2017-09-15 NOTE — Assessment & Plan Note (Signed)
B12 given today.  Check level.

## 2017-09-15 NOTE — Patient Instructions (Signed)
Nice to see you. We will get lab work today and contact you with results. Please try to stay as active as you are able to do help with your strength. Please monitor your memory and if it worsens please let us know.

## 2017-09-16 ENCOUNTER — Ambulatory Visit
Admission: RE | Admit: 2017-09-16 | Discharge: 2017-09-16 | Disposition: A | Discharge status: Home / Self Care | Payer: Medicare Other | Source: Ambulatory Visit | Attending: Family Medicine | Admitting: Family Medicine

## 2017-09-16 ENCOUNTER — Telehealth: Payer: Self-pay

## 2017-09-16 DIAGNOSIS — R17 Unspecified jaundice: Secondary | ICD-10-CM

## 2017-09-16 DIAGNOSIS — Z9049 Acquired absence of other specified parts of digestive tract: Secondary | ICD-10-CM | POA: Insufficient documentation

## 2017-09-16 DIAGNOSIS — R945 Abnormal results of liver function studies: Secondary | ICD-10-CM | POA: Insufficient documentation

## 2017-09-16 DIAGNOSIS — R7989 Other specified abnormal findings of blood chemistry: Secondary | ICD-10-CM | POA: Diagnosis not present

## 2017-09-16 NOTE — Telephone Encounter (Signed)
Thanks for letting me know and for getting this scheduled.

## 2017-09-16 NOTE — Telephone Encounter (Signed)
She is scheduled today at 2:30 since she had already ate two pieces of toast and a cup a coffee this morning. Her daughter Dawn Cooper wants to thank you for getting this taken care  for her mother so fast and taking your time yesterday to call.

## 2017-09-16 NOTE — Telephone Encounter (Signed)
Results in epic

## 2017-09-16 NOTE — Telephone Encounter (Signed)
Copied from [place] [id number]. Topic: General - Other >> Sep 16, 2017  3:14 PM Keene Breath wrote: Reason for CRM: Anderson Malta from [facility] called to give an ultrasound report on patient.  CB# [phone].

## 2017-09-17 ENCOUNTER — Other Ambulatory Visit (INDEPENDENT_AMBULATORY_CARE_PROVIDER_SITE_OTHER): Payer: Medicare Other

## 2017-09-17 DIAGNOSIS — R17 Unspecified jaundice: Secondary | ICD-10-CM | POA: Diagnosis not present

## 2017-09-17 DIAGNOSIS — R7989 Other specified abnormal findings of blood chemistry: Secondary | ICD-10-CM

## 2017-09-17 DIAGNOSIS — R945 Abnormal results of liver function studies: Secondary | ICD-10-CM | POA: Diagnosis not present

## 2017-09-17 LAB — COMPREHENSIVE METABOLIC PANEL
ALT: 46 U/L — AB (ref 0–35)
AST: 47 U/L — ABNORMAL HIGH (ref 0–37)
Albumin: 3.3 g/dL — ABNORMAL LOW (ref 3.5–5.2)
Alkaline Phosphatase: 256 U/L — ABNORMAL HIGH (ref 39–117)
BUN: 21 mg/dL (ref 6–23)
CO2: 26 meq/L (ref 19–32)
Calcium: 9.3 mg/dL (ref 8.4–10.5)
Chloride: 107 mEq/L (ref 96–112)
Creatinine, Ser: 1.43 mg/dL — ABNORMAL HIGH (ref 0.40–1.20)
GFR: 36.09 mL/min — AB (ref >60.00)
GLUCOSE: 126 mg/dL — AB (ref 70–99)
POTASSIUM: 4 meq/L (ref 3.5–5.1)
SODIUM: 141 meq/L (ref 135–145)
Total Bilirubin: 2 mg/dL — ABNORMAL HIGH (ref 0.2–1.2)
Total Protein: 6.8 g/dL (ref 6.0–8.3)

## 2017-09-17 LAB — LIPASE: Lipase: 70 U/L — ABNORMAL HIGH (ref 11.0–59.0)

## 2017-09-17 NOTE — Addendum Note (Signed)
Addended by: Leone Haven on: 09/17/2017 08:21 AM   Modules accepted: Orders

## 2017-09-17 NOTE — Telephone Encounter (Signed)
Please advise 

## 2017-09-17 NOTE — Telephone Encounter (Signed)
Dawn Cooper, Daughter would like the nurse to call her back about the ultra sound yesterday. Call back @ [phone]

## 2017-09-17 NOTE — Telephone Encounter (Signed)
Please let the patient or her daughter know that the [place] did not reveal any specific cause for her abnormal liver function tests. I would like to recheck them today. I have placed orders. We may need to obtain a CT scan to evaluate for a cause, though I would like to see the repeat labs first. Thanks.

## 2017-09-17 NOTE — Telephone Encounter (Signed)
Patients daughter notified and scheduled

## 2017-09-18 LAB — BILIRUBIN, FRACTIONATED(TOT/DIR/INDIR)
BILIRUBIN DIRECT: 1 mg/dL — AB (ref 0.0–0.2)
BILIRUBIN TOTAL: 2 mg/dL — AB (ref 0.2–1.2)
Indirect Bilirubin: 1 mg/dL (calc) (ref 0.2–1.2)

## 2017-09-22 ENCOUNTER — Other Ambulatory Visit: Payer: Self-pay | Admitting: Family Medicine

## 2017-09-22 DIAGNOSIS — R7989 Other specified abnormal findings of blood chemistry: Secondary | ICD-10-CM

## 2017-09-22 DIAGNOSIS — R17 Unspecified jaundice: Secondary | ICD-10-CM

## 2017-09-22 DIAGNOSIS — R945 Abnormal results of liver function studies: Principal | ICD-10-CM

## 2017-09-22 DIAGNOSIS — R748 Abnormal levels of other serum enzymes: Secondary | ICD-10-CM

## 2017-09-23 ENCOUNTER — Telehealth: Payer: Self-pay | Admitting: Family Medicine

## 2017-09-23 DIAGNOSIS — R17 Unspecified jaundice: Secondary | ICD-10-CM

## 2017-09-23 DIAGNOSIS — R945 Abnormal results of liver function studies: Secondary | ICD-10-CM

## 2017-09-23 NOTE — Telephone Encounter (Signed)
Please contact the patients daughter and let her know I heard back from GI regarding her mothers labs. They recommended an MRI to evaluate further. Please find out if the patient has a pacemaker or any metal in her body. If she does not have these, I will order the MRI ahead of her seeing GI. Thanks.

## 2017-09-23 NOTE — Telephone Encounter (Signed)
-----  Message from Jonathon Bellows, MD sent at 09/23/2017  2:19 PM EDT ----- Good afternoon   Noted message below. I agree with your assesment . Probably MRI would be the next step to determine if there is any obstruction since she has an elevated Tbilirunbin which is a mixture of conjugated and unconjugated. Her alk phos is also elevated.   Either you can order the MRCP or we can when she sees [place]. IF MRCP is negative then will look for causes of hepatitis in her. I assume she has not been started on any new medications/antibiotics recently ?  Regards  Kiran  ----- Message ----- From: Leone Haven, MD Sent: 09/23/2017   2:07 PM To: Jonathon Bellows, MD  Delene Ruffini,  I wanted to see if you could help me with a question. This patient was incidentally noted to have an elevated bilirubin, alk phos, AST, and ALT on lab work recently. I repeated the labs and they were still elevated though had trended down. I obtained a RUQ [place] that showed a mild biliary prominence that the radiologist suspected was related to her prior cholecystectomy though no other changes. She has not had any symptoms. I have referred her to you all for evaluation, though wanted to see if there was anything else that needed to be done prior to her seeing you. I discussed doing a CT scan with the patient though her renal function is not great and I did not want to obtain this study if she needed MRCP. Please let me know your thoughts. Thanks for your help.   Randall Hiss ----- Message ----- From: Juanda Chance, CMA Sent: 09/22/2017   9:25 AM To: Leone Haven, MD  fyi

## 2017-09-23 NOTE — Telephone Encounter (Signed)
Patients daughter notified and states the patient had broke her right leg and has metal in her leg. She does not have a Psychologist, forensic.

## 2017-09-29 NOTE — Telephone Encounter (Signed)
Patient daughter notified and patient is scheduled for labs tomorrow.  FYI Patients daughter just wanted to let you know that the patient did have a stent placed last year.

## 2017-09-29 NOTE — Telephone Encounter (Signed)
I will forward to Melissa to check with radiology regarding whether this MRCP should be with or without contrast. It is to evaluate for obstruction of her bile duct and is to be done due to elevated LFTs and bilirubin. Most recent GFR was 36. Please let me know if you have any other questions.

## 2017-09-29 NOTE — Telephone Encounter (Signed)
Noted. I will place an order for this though I want to see if she can come back in to recheck her labs and renal function before we complete the MRCP so we can make sure her renal function can tolerate the contrast that would need to be given. Please contact the patients daughter to see if we can have her come in for labs on Tuesday and see if they are willing to do the MRCP which is an MRI that looks at the bile duct more closely to see if there is an abostruction. I have placed lab orders. Thanks.

## 2017-09-29 NOTE — Telephone Encounter (Signed)
Per Dr. Clovis Riley at [facility] it needs to be with contrast.

## 2017-09-29 NOTE — Addendum Note (Signed)
Addended by: Caryl Bis ERIC G on: 09/29/2017 02:57 PM   Modules accepted: Orders

## 2017-09-30 ENCOUNTER — Other Ambulatory Visit (INDEPENDENT_AMBULATORY_CARE_PROVIDER_SITE_OTHER): Payer: Medicare Other

## 2017-09-30 DIAGNOSIS — R17 Unspecified jaundice: Secondary | ICD-10-CM | POA: Diagnosis not present

## 2017-09-30 LAB — COMPREHENSIVE METABOLIC PANEL
ALBUMIN: 3.4 g/dL — AB (ref 3.5–5.2)
ALT: 52 U/L — ABNORMAL HIGH (ref 0–35)
AST: 58 U/L — ABNORMAL HIGH (ref 0–37)
Alkaline Phosphatase: 221 U/L — ABNORMAL HIGH (ref 39–117)
BUN: 15 mg/dL (ref 6–23)
CO2: 28 mEq/L (ref 19–32)
CREATININE: 1.33 mg/dL — AB (ref 0.40–1.20)
Calcium: 9.7 mg/dL (ref 8.4–10.5)
Chloride: 104 mEq/L (ref 96–112)
GFR: 39.24 mL/min — ABNORMAL LOW (ref >60.00)
Glucose, Bld: 103 mg/dL — ABNORMAL HIGH (ref 70–99)
Potassium: 4.2 mEq/L (ref 3.5–5.1)
SODIUM: 139 meq/L (ref 135–145)
TOTAL PROTEIN: 7 g/dL (ref 6.0–8.3)
Total Bilirubin: 1.4 mg/dL — ABNORMAL HIGH (ref 0.2–1.2)

## 2017-09-30 LAB — LIPASE: Lipase: 34 U/L (ref 11.0–59.0)

## 2017-09-30 NOTE — Telephone Encounter (Signed)
Noted. MRCP ordered. We will check with radiology to ensure that the stent will not be an issue.

## 2017-09-30 NOTE — Addendum Note (Signed)
Addended by: Leone Haven on: 09/30/2017 08:05 AM   Modules accepted: Orders

## 2017-10-01 LAB — BILIRUBIN, FRACTIONATED(TOT/DIR/INDIR)
BILIRUBIN DIRECT: 0.5 mg/dL — AB (ref 0.0–0.2)
Indirect Bilirubin: 0.9 mg/dL (calc) (ref 0.2–1.2)
Total Bilirubin: 1.4 mg/dL — ABNORMAL HIGH (ref 0.2–1.2)

## 2017-10-01 NOTE — Telephone Encounter (Signed)
Centralized scheduling states that it needs to be changed to with and without contrast.  Stent is not an issue

## 2017-10-01 NOTE — Telephone Encounter (Signed)
Order changed. Thanks for checking on the stent. Is there anyway to get this done by some time next week?

## 2017-10-01 NOTE — Telephone Encounter (Signed)
She is scheduled for Friday July 12. Her daughter Katharine Look is aware.

## 2017-10-01 NOTE — Addendum Note (Signed)
Addended by: Leone Haven on: 10/01/2017 10:56 AM   Modules accepted: Orders

## 2017-10-03 ENCOUNTER — Other Ambulatory Visit: Payer: Self-pay | Admitting: Family Medicine

## 2017-10-03 ENCOUNTER — Ambulatory Visit
Admission: RE | Admit: 2017-10-03 | Discharge: 2017-10-03 | Disposition: A | Discharge status: Home / Self Care | Payer: Medicare Other | Source: Ambulatory Visit | Attending: Family Medicine | Admitting: Family Medicine

## 2017-10-03 ENCOUNTER — Telehealth: Payer: Self-pay | Admitting: Family Medicine

## 2017-10-03 DIAGNOSIS — J90 Pleural effusion, not elsewhere classified: Secondary | ICD-10-CM | POA: Diagnosis not present

## 2017-10-03 DIAGNOSIS — R945 Abnormal results of liver function studies: Secondary | ICD-10-CM | POA: Insufficient documentation

## 2017-10-03 DIAGNOSIS — I517 Cardiomegaly: Secondary | ICD-10-CM | POA: Insufficient documentation

## 2017-10-03 DIAGNOSIS — K8050 Calculus of bile duct without cholangitis or cholecystitis without obstruction: Secondary | ICD-10-CM | POA: Diagnosis not present

## 2017-10-03 DIAGNOSIS — K8020 Calculus of gallbladder without cholecystitis without obstruction: Secondary | ICD-10-CM | POA: Diagnosis not present

## 2017-10-03 DIAGNOSIS — I10 Essential (primary) hypertension: Secondary | ICD-10-CM

## 2017-10-03 MED ORDER — GADOBENATE DIMEGLUMINE 529 MG/ML IV SOLN
10.0000 mL | Freq: Once | INTRAVENOUS | Status: AC | PRN
  Administered 2017-10-03: 7 mL via INTRAVENOUS

## 2017-10-03 NOTE — Telephone Encounter (Signed)
I heard back from GI regarding the qtc prolongation. I advised her qtc was 501. Which they felt was elevated. They noted that another option would be augmentin though the patient has a penicillin allergy. They suggested obtaining an EKG and electrolytes tomorrow. I noted that we could get the electrolytes done tomorrow, though our office is closed over the weekend and I do not know of another way to get an EKG over the weekend. I advised we could get the EKG done on Monday in our office. He noted we should plan for that. I called and spoke with the patients daughter and advised of the need for labs and an EKG prior to determining if she could be placed on ciprofloxacin. I advised that the patient has a finding on her EKG that potentially puts her at risk for an arrhythmia particularly if she were to be on a medication that increased that risk. The daughter was glad that I caught this and noted that she would not want that risk for her mother. Orders placed for labs to be done at the hospital lab. Patient will go tomorrow. We will contact her to get her set up for the EKG on Monday.

## 2017-10-03 NOTE — Telephone Encounter (Signed)
Called and spoke with the patient and her daughter regarding the finding of choledocolithiasis on MRCP. The patient notes she has not had any abdominal pain or other symptoms. I messaged Dr Vicente Males of GI regarding to see if he was available to discuss further. No identifying information was included in the message. He responded and asked for me to forward him the report so he could review and then he would get back to me. He reviewed the report and advised that she needed an ERCP and that he would discuss with Dr Allen Norris about getting this set up. I called the patients daughter back and advised her of this. We briefly discussed the ERCP procedure and I discussed that I felt she should have this completed due to risk for pancreatitis though noted that GI would contact her and discuss further. I subsequently received another message from Dr Vicente Males regarding his discussion with Dr Allen Norris. They advised they would get her set up for ERCP next week. She would need to be off her plavix starting today. She should be placed on ciprofloxacin renally dose. They also advised return precautions. I called the patients daughter and relayed this information. She has had >12 months of DAPT with aspirin and plavix and is acceptable to hold the plavix in the setting of the procedure. I discussed that if she were to develop any chest pain or SOB she would need to be evaluated. They will hold plavix at this time. Creatinine clearance 25. On review of the patients chart it appears she has a prolonged qtc interval. I messaged GI again to determine if they would prefer to proceed with cipro or if there was an alternative choice. I will await their response.

## 2017-10-04 ENCOUNTER — Other Ambulatory Visit
Admission: RE | Admit: 2017-10-04 | Discharge: 2017-10-04 | Disposition: A | Discharge status: Home / Self Care | Payer: Medicare Other | Source: Ambulatory Visit | Attending: Family Medicine | Admitting: Family Medicine

## 2017-10-04 DIAGNOSIS — I10 Essential (primary) hypertension: Secondary | ICD-10-CM | POA: Diagnosis not present

## 2017-10-04 LAB — BASIC METABOLIC PANEL
Anion gap: 6 (ref 5–15)
BUN: 17 mg/dL (ref 8–23)
CHLORIDE: 109 mmol/L (ref 98–111)
CO2: 25 mmol/L (ref 22–32)
Calcium: 9.1 mg/dL (ref 8.9–10.3)
Creatinine, Ser: 1.28 mg/dL — ABNORMAL HIGH (ref 0.44–1.00)
GFR calc Af Amer: 40 mL/min — ABNORMAL LOW (ref >60)
GFR calc non Af Amer: 34 mL/min — ABNORMAL LOW (ref >60)
Glucose, Bld: 172 mg/dL — ABNORMAL HIGH (ref 70–99)
Potassium: 4 mmol/L (ref 3.5–5.1)
SODIUM: 140 mmol/L (ref 135–145)

## 2017-10-06 ENCOUNTER — Telehealth: Payer: Self-pay | Admitting: Family Medicine

## 2017-10-06 ENCOUNTER — Ambulatory Visit (INDEPENDENT_AMBULATORY_CARE_PROVIDER_SITE_OTHER): Payer: Medicare Other | Admitting: *Deleted

## 2017-10-06 ENCOUNTER — Other Ambulatory Visit: Payer: Self-pay

## 2017-10-06 DIAGNOSIS — I482 Chronic atrial fibrillation, unspecified: Secondary | ICD-10-CM

## 2017-10-06 DIAGNOSIS — R7989 Other specified abnormal findings of blood chemistry: Secondary | ICD-10-CM

## 2017-10-06 DIAGNOSIS — R945 Abnormal results of liver function studies: Principal | ICD-10-CM

## 2017-10-06 DIAGNOSIS — K831 Obstruction of bile duct: Secondary | ICD-10-CM

## 2017-10-06 NOTE — Telephone Encounter (Signed)
Patient notified and is scheduled for EKG today

## 2017-10-06 NOTE — Telephone Encounter (Signed)
Hi, Dr. Vicente Males called me on Friday and told me about the case. I did not know about the QT but it is fine. I will address it when she comes for the ERCP. As always, thank you for trusting your patient with our group.  Cordell Coke

## 2017-10-06 NOTE — Telephone Encounter (Signed)
Patient was in the office today to have her EKG completed to see what her QTc interval was.  It is 476.  The patient's daughter was on the phone with GI.  She notes that they are getting her set up for the ERCP tomorrow.  They are aware she discontinued the Plavix on Friday.  I spoke with Dr. Dorothey Baseman nurse.  I advised that I had spoken with Dr. Vicente Males last week regarding this patient and that he had relayed the information to Dr. Allen Norris who gave recommendations for ERCP, holding Plavix, and starting Cipro.  I discussed after they spoke I spoke again with Dr. Vicente Males regarding the patient's prolonged QTc interval and the risk of further prolongation of this with ciprofloxacin.  Please see that phone note.  We discussed obtaining labs on Saturday and repeating an EKG today to check her QTC prior to placing her on ciprofloxacin.  I discussed that the patient's QTC had improved to 476.  I asked if she would pass the information along that the patient has not been on ciprofloxacin and I asked that they let me know if the patient needed to be started on Cipro prior to the procedure as it would be acceptable at this point with her current QTc.  If she does not need to be on the ciprofloxacin prior to the procedure that would be fine from my perspective.  She advised she would relay this information to Dr. Allen Norris and he would contact me if he had any further questions. I will send as an FYI to Dr Allen Norris so he could message if needed.

## 2017-10-07 ENCOUNTER — Ambulatory Visit: Payer: Medicare Other

## 2017-10-07 ENCOUNTER — Encounter
Admission: RE | Disposition: A | Discharge status: Home / Self Care | Payer: Self-pay | Source: Ambulatory Visit | Attending: Gastroenterology

## 2017-10-07 ENCOUNTER — Encounter: Payer: Self-pay | Admitting: Emergency Medicine

## 2017-10-07 ENCOUNTER — Ambulatory Visit: Payer: Medicare Other | Admitting: Certified Registered"

## 2017-10-07 ENCOUNTER — Other Ambulatory Visit: Payer: Self-pay

## 2017-10-07 ENCOUNTER — Ambulatory Visit (HOSPITAL_BASED_OUTPATIENT_CLINIC_OR_DEPARTMENT_OTHER)
Admission: RE | Admit: 2017-10-07 | Discharge: 2017-10-07 | Disposition: A | Discharge status: Home / Self Care | Payer: Medicare Other | Source: Ambulatory Visit | Attending: Gastroenterology | Admitting: Gastroenterology

## 2017-10-07 ENCOUNTER — Encounter: Payer: Self-pay | Admitting: *Deleted

## 2017-10-07 DIAGNOSIS — I252 Old myocardial infarction: Secondary | ICD-10-CM

## 2017-10-07 DIAGNOSIS — K264 Chronic or unspecified duodenal ulcer with hemorrhage: Secondary | ICD-10-CM | POA: Diagnosis present

## 2017-10-07 DIAGNOSIS — Z8673 Personal history of transient ischemic attack (TIA), and cerebral infarction without residual deficits: Secondary | ICD-10-CM

## 2017-10-07 DIAGNOSIS — Z955 Presence of coronary angioplasty implant and graft: Secondary | ICD-10-CM

## 2017-10-07 DIAGNOSIS — K8050 Calculus of bile duct without cholangitis or cholecystitis without obstruction: Secondary | ICD-10-CM

## 2017-10-07 DIAGNOSIS — Z7902 Long term (current) use of antithrombotics/antiplatelets: Secondary | ICD-10-CM

## 2017-10-07 DIAGNOSIS — I9589 Other hypotension: Secondary | ICD-10-CM | POA: Diagnosis present

## 2017-10-07 DIAGNOSIS — I2510 Atherosclerotic heart disease of native coronary artery without angina pectoris: Secondary | ICD-10-CM | POA: Diagnosis present

## 2017-10-07 DIAGNOSIS — I082 Rheumatic disorders of both aortic and tricuspid valves: Secondary | ICD-10-CM | POA: Diagnosis present

## 2017-10-07 DIAGNOSIS — Z79899 Other long term (current) drug therapy: Secondary | ICD-10-CM

## 2017-10-07 DIAGNOSIS — I129 Hypertensive chronic kidney disease with stage 1 through stage 4 chronic kidney disease, or unspecified chronic kidney disease: Secondary | ICD-10-CM | POA: Diagnosis present

## 2017-10-07 DIAGNOSIS — I429 Cardiomyopathy, unspecified: Secondary | ICD-10-CM | POA: Diagnosis present

## 2017-10-07 DIAGNOSIS — K922 Gastrointestinal hemorrhage, unspecified: Secondary | ICD-10-CM | POA: Diagnosis not present

## 2017-10-07 DIAGNOSIS — Z66 Do not resuscitate: Secondary | ICD-10-CM | POA: Diagnosis not present

## 2017-10-07 DIAGNOSIS — Z7982 Long term (current) use of aspirin: Secondary | ICD-10-CM

## 2017-10-07 DIAGNOSIS — I4891 Unspecified atrial fibrillation: Secondary | ICD-10-CM | POA: Diagnosis present

## 2017-10-07 DIAGNOSIS — K831 Obstruction of bile duct: Secondary | ICD-10-CM

## 2017-10-07 DIAGNOSIS — Z951 Presence of aortocoronary bypass graft: Secondary | ICD-10-CM

## 2017-10-07 DIAGNOSIS — Y838 Other surgical procedures as the cause of abnormal reaction of the patient, or of later complication, without mention of misadventure at the time of the procedure: Secondary | ICD-10-CM | POA: Diagnosis present

## 2017-10-07 DIAGNOSIS — K219 Gastro-esophageal reflux disease without esophagitis: Secondary | ICD-10-CM | POA: Diagnosis present

## 2017-10-07 DIAGNOSIS — N183 Chronic kidney disease, stage 3 (moderate): Secondary | ICD-10-CM | POA: Diagnosis not present

## 2017-10-07 DIAGNOSIS — K5730 Diverticulosis of large intestine without perforation or abscess without bleeding: Secondary | ICD-10-CM | POA: Diagnosis not present

## 2017-10-07 DIAGNOSIS — N189 Chronic kidney disease, unspecified: Secondary | ICD-10-CM

## 2017-10-07 DIAGNOSIS — Z880 Allergy status to penicillin: Secondary | ICD-10-CM

## 2017-10-07 DIAGNOSIS — K91840 Postprocedural hemorrhage and hematoma of a digestive system organ or structure following a digestive system procedure: Principal | ICD-10-CM | POA: Diagnosis present

## 2017-10-07 DIAGNOSIS — R17 Unspecified jaundice: Secondary | ICD-10-CM | POA: Diagnosis not present

## 2017-10-07 DIAGNOSIS — Z888 Allergy status to other drugs, medicaments and biological substances status: Secondary | ICD-10-CM

## 2017-10-07 DIAGNOSIS — Y92019 Unspecified place in single-family (private) house as the place of occurrence of the external cause: Secondary | ICD-10-CM

## 2017-10-07 DIAGNOSIS — Y836 Removal of other organ (partial) (total) as the cause of abnormal reaction of the patient, or of later complication, without mention of misadventure at the time of the procedure: Secondary | ICD-10-CM | POA: Diagnosis present

## 2017-10-07 HISTORY — DX: Acute myocardial infarction, unspecified: I21.9

## 2017-10-07 HISTORY — PX: ERCP: SHX5425

## 2017-10-07 LAB — CBC
HCT: 35 % (ref 35.0–47.0)
Hemoglobin: 12.1 g/dL (ref 12.0–16.0)
MCH: 32.4 pg (ref 26.0–34.0)
MCHC: 34.6 g/dL (ref 32.0–36.0)
MCV: 93.9 fL (ref 80.0–100.0)
Platelets: 206 10*3/uL (ref 150–440)
RBC: 3.72 MIL/uL — AB (ref 3.80–5.20)
RDW: 13.8 % (ref 11.5–14.5)
WBC: 8.2 10*3/uL (ref 3.6–11.0)

## 2017-10-07 LAB — COMPREHENSIVE METABOLIC PANEL
ALK PHOS: 143 U/L — AB (ref 38–126)
ALT: 22 U/L (ref 0–44)
AST: 34 U/L (ref 15–41)
Albumin: 2.9 g/dL — ABNORMAL LOW (ref 3.5–5.0)
Anion gap: 7 (ref 5–15)
BUN: 31 mg/dL — ABNORMAL HIGH (ref 8–23)
CALCIUM: 9 mg/dL (ref 8.9–10.3)
CO2: 22 mmol/L (ref 22–32)
CREATININE: 1.36 mg/dL — AB (ref 0.44–1.00)
Chloride: 110 mmol/L (ref 98–111)
GFR calc non Af Amer: 32 mL/min — ABNORMAL LOW (ref >60)
GFR, EST AFRICAN AMERICAN: 37 mL/min — AB (ref >60)
Glucose, Bld: 135 mg/dL — ABNORMAL HIGH (ref 70–99)
Potassium: 4.9 mmol/L (ref 3.5–5.1)
Sodium: 139 mmol/L (ref 135–145)
Total Bilirubin: 1.6 mg/dL — ABNORMAL HIGH (ref 0.3–1.2)
Total Protein: 6.3 g/dL — ABNORMAL LOW (ref 6.5–8.1)

## 2017-10-07 SURGERY — ERCP, WITH INTERVENTION IF INDICATED
Anesthesia: General

## 2017-10-07 MED ORDER — INDOMETHACIN 50 MG RE SUPP
RECTAL | Status: AC
  Filled 2017-10-07: qty 2

## 2017-10-07 MED ORDER — LIDOCAINE HCL (PF) 2 % IJ SOLN
INTRAMUSCULAR | Status: AC
  Filled 2017-10-07: qty 10

## 2017-10-07 MED ORDER — PROPOFOL 10 MG/ML IV BOLUS
INTRAVENOUS | Status: DC | PRN
  Administered 2017-10-07 (×2): 10 mg via INTRAVENOUS
  Administered 2017-10-07 (×2): 20 mg via INTRAVENOUS
  Administered 2017-10-07: 10 mg via INTRAVENOUS
  Administered 2017-10-07: 20 mg via INTRAVENOUS

## 2017-10-07 MED ORDER — LIDOCAINE HCL (CARDIAC) PF 100 MG/5ML IV SOSY
PREFILLED_SYRINGE | INTRAVENOUS | Status: DC | PRN
  Administered 2017-10-07: 50 mg via INTRAVENOUS

## 2017-10-07 MED ORDER — INDOMETHACIN 50 MG RE SUPP
RECTAL | Status: DC | PRN
  Administered 2017-10-07: 100 mg via RECTAL

## 2017-10-07 MED ORDER — DEXMEDETOMIDINE HCL IN NACL 200 MCG/50ML IV SOLN
INTRAVENOUS | Status: DC | PRN
  Administered 2017-10-07: .2 ug/kg/h via INTRAVENOUS

## 2017-10-07 MED ORDER — PHENYLEPHRINE HCL 10 MG/ML IJ SOLN
INTRAMUSCULAR | Status: DC | PRN
  Administered 2017-10-07: 50 ug via INTRAVENOUS

## 2017-10-07 MED ORDER — SODIUM CHLORIDE 0.9 % IV SOLN
INTRAVENOUS | Status: DC
  Administered 2017-10-07: 12:00:00 via INTRAVENOUS

## 2017-10-07 MED ORDER — DEXMEDETOMIDINE HCL IN NACL 200 MCG/50ML IV SOLN
INTRAVENOUS | Status: AC
  Filled 2017-10-07: qty 50

## 2017-10-07 MED ORDER — INDOMETHACIN 50 MG RE SUPP: 100.0000 mg | Freq: Once | RECTAL | Status: DC

## 2017-10-07 NOTE — Op Note (Addendum)
[facility] Gastroenterology Patient Name: Dawn Cooper Procedure Date: 10/07/2017 11:43 AM MRN: [id number] Account #: [id number] Date of Birth: [birth year] Admit Type: Outpatient Age: 84 Room: [facility] ENDO ROOM 4 Gender: Female Note Status: Finalized Procedure:            ERCP Indications:          Common bile duct stone(s) Providers:            Lucilla Lame MD, MD Referring MD:         Angela Adam. Caryl Bis (Referring MD) Medicines:            Propofol per Anesthesia Complications:        No immediate complications. Procedure:            Pre-Anesthesia Assessment:                       - Prior to the procedure, a History and Physical was                        performed, and patient medications and allergies were                        reviewed. The patient's tolerance of previous                        anesthesia was also reviewed. The risks and benefits of                        the procedure and the sedation options and risks were                        discussed with the patient. All questions were                        answered, and informed consent was obtained. Prior                        Anticoagulants: The patient has taken Plavix                        (clopidogrel), last dose was 5 days prior to procedure.                        ASA Grade Assessment: III - A patient with severe                        systemic disease. After reviewing the risks and                        benefits, the patient was deemed in satisfactory                        condition to undergo the procedure.                       After obtaining informed consent, the scope was passed                        under direct vision. Throughout the procedure, the  patient's blood pressure, pulse, and oxygen saturations                        were monitored continuously. The Duodenoscope was                        introduced through the mouth, and used to inject                contrast into and used to cannulate the bile duct. The                        ERCP was accomplished without difficulty. The patient                        tolerated the procedure well. Findings:      A scout film of the abdomen was obtained. Surgical clips, consistent       with a previous cholecystectomy, were seen in the area of the right       upper quadrant of the abdomen. The major papilla was located entirely       within a diverticulum. The major papilla was normal. A wire was passed       into the biliary tree. The bile duct was deeply cannulated with the       short-nosed traction sphincterotome. Contrast was injected. I personally       interpreted the bile duct images. There was brisk flow of contrast       through the ducts. Image quality was excellent. Contrast extended to the       main bile duct. The main bile duct contained three stones mm. A 9 mm       biliary sphincterotomy was made with a traction (standard)       sphincterotome using ERBE electrocautery. The sphincterotomy oozed       blood. The biliary tree was swept with a 15 mm balloon starting at the       bifurcation. All stones were removed. Nothing was found. Impression:           - The major papilla was located entirely within a                        diverticulum.                       - The major papilla appeared normal.                       - Choledocholithiasis was found. Complete removal was                        accomplished by biliary sphincterotomy and balloon                        extraction.                       - A biliary sphincterotomy was performed.                       - The biliary tree was swept and nothing was found. Recommendation:       - Discharge patient to home.                       -  Clear liquid diet today. Procedure Code(s):    --- Professional ---                       [phone], Endoscopic retrograde cholangiopancreatography                        (ERCP); with removal  of calculi/debris from                        biliary/pancreatic duct(s)                       43262, Endoscopic retrograde cholangiopancreatography                        (ERCP); with sphincterotomy/papillotomy                       [phone], Endoscopic catheterization of the biliary ductal                        system, radiological supervision and interpretation Diagnosis Code(s):    --- Professional ---                       K80.50, Calculus of bile duct without cholangitis or                        cholecystitis without obstruction CPT copyright 2017 American Medical Association. All rights reserved. The codes documented in this report are preliminary and upon coder review may  be revised to meet current compliance requirements. Lucilla Lame MD, MD 10/07/2017 12:13:57 PM This report has been signed electronically. Number of Addenda: 0 Note Initiated On: 10/07/2017 11:43 AM      [facility]

## 2017-10-07 NOTE — Transfer of Care (Signed)
Immediate Anesthesia Transfer of Care Note  Patient: Dawn Cooper  Procedure(s) Performed: Procedure(s): ENDOSCOPIC RETROGRADE CHOLANGIOPANCREATOGRAPHY (ERCP) (N/A)  Patient Location: PACU  Anesthesia Type:General  Level of Consciousness: sedated  Airway & Oxygen Therapy: Patient Spontanous Breathing and Patient connected to face mask oxygen  Post-op Assessment: Report given to RN and Post -op Vital signs reviewed and stable  Post vital signs: Reviewed and stable  Last Vitals:  Vitals:   10/07/17 1219  BP: (!) 90/53  Pulse: 62  Resp: 18  Temp: (!) 36.1 C  SpO2: 17%    Complications: No apparent anesthesia complications

## 2017-10-07 NOTE — ED Triage Notes (Signed)
Patient to ER for c/o emesis x1 after ERCP today. Patient vomited x1 at approx 2230. Prior to vomiting, patient had been feeling fine and was able to eat chicken noodle soup. Denies any current nausea.

## 2017-10-07 NOTE — Progress Notes (Signed)
Patient seen ECG per Dr. Caryl Bis order.

## 2017-10-07 NOTE — H&P (Signed)
Dawn Lame, MD [facility] [address] [place], [place] [address] Phone:[phone] Fax : [phone]  Primary Care Physician:  Leone Haven, MD Primary Gastroenterologist:  Dr. Allen Norris  Pre-Procedure History & Physical: HPI:  Dawn Cooper is a 84 y.o. female is here for an ERCP.   Past Medical History:  Diagnosis Date  . A-fib ([place])   . Broken leg    right  . Cardiomyopathy   . Chronic kidney disease   . GERD (gastroesophageal reflux disease)   . Heart murmur   . Hiatal hernia   . Hypertension   . Mild mitral regurgitation by prior echocardiogram   . Moderate aortic stenosis   . Myocardial infarction ([place])   . Stroke ([place])   . Tricuspid regurgitation     Past Surgical History:  Procedure Laterality Date  . APPENDECTOMY    . CATARACT EXTRACTION  2013   left  . CHOLECYSTECTOMY    . CORONARY ANGIOGRAPHY N/A 08/09/2016   Procedure: Coronary Angiography;  Surgeon: Burnell Blanks, MD;  Location: [place] [facility];  Service: Cardiovascular;  Laterality: N/A;  . CORONARY ANGIOPLASTY    . CORONARY ARTERY BYPASS GRAFT    . CORONARY STENT INTERVENTION N/A 08/09/2016   Procedure: Coronary Stent Intervention;  Surgeon: Burnell Blanks, MD;  Location: [place] [facility];  Service: Cardiovascular;  Laterality: N/A;  . EYE SURGERY    . FEMUR FRACTURE SURGERY  12/2011   Dr. Sabra Heck, s/p rehab at [facility]  . FRACTURE SURGERY    . GALLBLADDER SURGERY    . IR RADIOLOGY PERIPHERAL GUIDED IV START  07/29/2016  . IR US GUIDE VASC ACCESS RIGHT  07/29/2016  . VAGINAL DELIVERY      Prior to Admission medications   Medication Sig Start Date End Date Taking? Authorizing Provider  aspirin EC 81 MG tablet Take 1 tablet (81 mg total) by mouth daily. 09/10/14  Yes Demetrios Loll, MD  clopidogrel (PLAVIX) 75 MG tablet TAKE 1 TABLET EVERY OTHER DAY 07/14/17  Yes Leone Haven, MD  furosemide (LASIX) 20 MG tablet TAKE 1 TABLET EVERY OTHER DAY. 12/30/16  Yes Minna Merritts, MD  metoprolol tartrate (LOPRESSOR) 25 MG tablet TAKE 1/2 TABLET TWICE DAILY 09/10/17  Yes Minna Merritts, MD  pantoprazole (PROTONIX) 40 MG tablet TAKE 1 TABLET EVERY DAY 04/23/17  Yes Leone Haven, MD  potassium chloride (K-DUR,KLOR-CON) 10 MEQ tablet TAKE 1 TABLET EVERY OTHER DAY 03/13/17  Yes Gollan, Kathlene November, MD  nitroGLYCERIN (NITROSTAT) 0.4 MG SL tablet Place 1 tablet (0.4 mg total) under the tongue every 5 (five) minutes x 3 doses as needed for chest pain. 08/14/16   Leanor Kail, PA    Allergies as of 10/06/2017 - Review Complete 10/06/2017  Allergen Reaction Noted  . Dabigatran Other (See Comments) 06/07/2014  . Dabigatran etexilate mesylate Other (See Comments) 09/09/2014  . Clonidine Itching and Rash 09/23/2012  . Clonidine derivatives Itching and Rash 09/23/2012  . Penicillins Itching, Rash, and Other (See Comments) 01/14/2011    Family History  Problem Relation Age of Onset  . Stroke Sister   . Cancer Brother        pancreatic  . Cancer Sister        pancreatic  . Diabetes Unknown   . Diabetes Brother   . Heart attack Neg Hx     Social History   Socioeconomic History  . Marital status: Widowed    Spouse name: Not on file  .  Number of children: Not on file  . Years of education: Not on file  . Highest education level: Not on file  Occupational History    Comment: Retired  Scientific laboratory technician  . Financial resource strain: Not hard at all  . Food insecurity:    Worry: Never true    Inability: Never true  . Transportation needs:    Medical: No    Non-medical: No  Tobacco Use  . Smoking status: Never Smoker  . Smokeless tobacco: Never Used  Substance and Sexual Activity  . Alcohol use: Never    Frequency: Never  . Drug use: Never  . Sexual activity: Never  Lifestyle  . Physical activity:    Days per week: 0 days    Minutes per session: Not on file  . Stress: Not at all  Relationships  . Social connections:    Talks on phone:  Patient refused    Gets together: Patient refused    Attends religious service: Patient refused    Active member of club or organization: Patient refused    Attends meetings of clubs or organizations: Patient refused    Relationship status: Patient refused  . Intimate partner violence:    Fear of current or ex partner: No    Emotionally abused: No    Physically abused: No    Forced sexual activity: No  Other Topics Concern  . Not on file  Social History Narrative   Lives in [place] alone. 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    Review of Systems: See HPI, otherwise negative ROS  Physical Exam: There were no vitals taken for this visit. General:   Alert,  pleasant and cooperative in NAD Head:  Normocephalic and atraumatic. Neck:  Supple; no masses or thyromegaly. Lungs:  Clear throughout to auscultation.    Heart:  Regular rate and rhythm. Abdomen:  Soft, nontender and nondistended. Normal bowel sounds, without guarding, and without rebound.   Neurologic:  Alert and  oriented x4;  grossly normal neurologically.  Impression/Plan: Dawn Cooper is here for an ERCP to be performed for CBD stones  Risks, benefits, limitations, and alternatives regarding  ERCP have been reviewed with the patient.  Questions have been answered.  All parties agreeable.   Dawn Lame, MD  10/07/2017, 11:37 AM

## 2017-10-07 NOTE — Anesthesia Postprocedure Evaluation (Signed)
Anesthesia Post Note  Patient: Dawn Cooper  Procedure(s) Performed: ENDOSCOPIC RETROGRADE CHOLANGIOPANCREATOGRAPHY (ERCP) (N/A )  Patient location during evaluation: Endoscopy Anesthesia Type: General Level of consciousness: awake and alert Pain management: pain level controlled Vital Signs Assessment: post-procedure vital signs reviewed and stable Respiratory status: spontaneous breathing, nonlabored ventilation, respiratory function stable and patient connected to nasal cannula oxygen Cardiovascular status: blood pressure returned to baseline and stable Postop Assessment: no apparent nausea or vomiting Anesthetic complications: no     Last Vitals:  Vitals:   10/07/17 1219 10/07/17 1229  BP: (!) 90/53 (!) 94/56  Pulse: 62   Resp: 18   Temp: (!) 36.1 C   SpO2: 99%     Last Pain:  Vitals:   10/07/17 1219  TempSrc: Tympanic                 Martha Clan

## 2017-10-07 NOTE — Anesthesia Post-op Follow-up Note (Signed)
Anesthesia QCDR form completed.        

## 2017-10-07 NOTE — Anesthesia Preprocedure Evaluation (Signed)
Anesthesia Evaluation  Patient identified by MRN, date of birth, ID band Patient awake    Reviewed: Allergy & Precautions, H&P , NPO status , Patient's Chart, lab work & pertinent test results, reviewed documented beta blocker date and time   History of Anesthesia Complications Negative for: history of anesthetic complications  Airway Mallampati: I  TM Distance: >3 FB Neck ROM: full    Dental  (+) Edentulous Upper, Edentulous Lower, Upper Dentures, Lower Dentures   Pulmonary neg pulmonary ROS,           Cardiovascular Exercise Tolerance: Good hypertension, (-) angina+ CAD, + Past MI, + Cardiac Stents and + DOE  (-) CABG + dysrhythmias Atrial Fibrillation + Valvular Problems/Murmurs AS and MR      Neuro/Psych neg Seizures PSYCHIATRIC DISORDERS Dementia TIACVA, Residual Symptoms    GI/Hepatic Neg liver ROS, hiatal hernia, GERD  ,  Endo/Other  negative endocrine ROS  Renal/GU CRFRenal disease  negative genitourinary   Musculoskeletal   Abdominal   Peds  Hematology  (+) anemia ,   Anesthesia Other Findings Past Medical History: No date: A-fib ([facility]) No date: Broken leg     Comment:  right No date: Cardiomyopathy No date: Chronic kidney disease No date: GERD (gastroesophageal reflux disease) No date: Heart murmur No date: Hiatal hernia No date: Hypertension No date: Mild mitral regurgitation by prior echocardiogram No date: Moderate aortic stenosis No date: Myocardial infarction ([place]) No date: Stroke [facility]) No date: Tricuspid regurgitation   Reproductive/Obstetrics negative OB ROS                             Anesthesia Physical Anesthesia Plan  ASA: III  Anesthesia Plan: General   Post-op Pain Management:    Induction: Intravenous  PONV Risk Score and Plan: 3 and Propofol infusion and TIVA  Airway Management Planned: Nasal Cannula  Additional Equipment:   Intra-op Plan:    Post-operative Plan:   Informed Consent: I have reviewed the patients History and Physical, chart, labs and discussed the procedure including the risks, benefits and alternatives for the proposed anesthesia with the patient or authorized representative who has indicated his/her understanding and acceptance.   Dental Advisory Given  Plan Discussed with: Anesthesiologist, CRNA and Surgeon  Anesthesia Plan Comments:         Anesthesia Quick Evaluation

## 2017-10-08 ENCOUNTER — Inpatient Hospital Stay: Payer: Medicare Other | Admitting: Anesthesiology

## 2017-10-08 ENCOUNTER — Encounter
Admission: EM | Disposition: A | Discharge status: Home / Self Care | Payer: Self-pay | Source: Home / Self Care | Attending: Internal Medicine

## 2017-10-08 ENCOUNTER — Encounter: Payer: Self-pay | Admitting: Gastroenterology

## 2017-10-08 ENCOUNTER — Emergency Department: Payer: Medicare Other

## 2017-10-08 ENCOUNTER — Inpatient Hospital Stay
Admission: EM | Admit: 2017-10-08 | Discharge: 2017-10-09 | DRG: 919 | Disposition: A | Discharge status: Home / Self Care | Payer: Medicare Other | Attending: Internal Medicine | Admitting: Internal Medicine

## 2017-10-08 DIAGNOSIS — Y838 Other surgical procedures as the cause of abnormal reaction of the patient, or of later complication, without mention of misadventure at the time of the procedure: Secondary | ICD-10-CM | POA: Diagnosis present

## 2017-10-08 DIAGNOSIS — Z880 Allergy status to penicillin: Secondary | ICD-10-CM | POA: Diagnosis not present

## 2017-10-08 DIAGNOSIS — R109 Unspecified abdominal pain: Secondary | ICD-10-CM | POA: Diagnosis not present

## 2017-10-08 DIAGNOSIS — Z79899 Other long term (current) drug therapy: Secondary | ICD-10-CM | POA: Diagnosis not present

## 2017-10-08 DIAGNOSIS — I429 Cardiomyopathy, unspecified: Secondary | ICD-10-CM | POA: Diagnosis present

## 2017-10-08 DIAGNOSIS — Y836 Removal of other organ (partial) (total) as the cause of abnormal reaction of the patient, or of later complication, without mention of misadventure at the time of the procedure: Secondary | ICD-10-CM | POA: Diagnosis present

## 2017-10-08 DIAGNOSIS — K269 Duodenal ulcer, unspecified as acute or chronic, without hemorrhage or perforation: Secondary | ICD-10-CM | POA: Diagnosis not present

## 2017-10-08 DIAGNOSIS — Z888 Allergy status to other drugs, medicaments and biological substances status: Secondary | ICD-10-CM | POA: Diagnosis not present

## 2017-10-08 DIAGNOSIS — I129 Hypertensive chronic kidney disease with stage 1 through stage 4 chronic kidney disease, or unspecified chronic kidney disease: Secondary | ICD-10-CM | POA: Diagnosis present

## 2017-10-08 DIAGNOSIS — I252 Old myocardial infarction: Secondary | ICD-10-CM | POA: Diagnosis not present

## 2017-10-08 DIAGNOSIS — K920 Hematemesis: Secondary | ICD-10-CM | POA: Diagnosis not present

## 2017-10-08 DIAGNOSIS — N183 Chronic kidney disease, stage 3 (moderate): Secondary | ICD-10-CM | POA: Diagnosis not present

## 2017-10-08 DIAGNOSIS — K219 Gastro-esophageal reflux disease without esophagitis: Secondary | ICD-10-CM | POA: Diagnosis present

## 2017-10-08 DIAGNOSIS — K8050 Calculus of bile duct without cholangitis or cholecystitis without obstruction: Secondary | ICD-10-CM | POA: Diagnosis present

## 2017-10-08 DIAGNOSIS — I10 Essential (primary) hypertension: Secondary | ICD-10-CM | POA: Diagnosis not present

## 2017-10-08 DIAGNOSIS — K922 Gastrointestinal hemorrhage, unspecified: Secondary | ICD-10-CM | POA: Diagnosis not present

## 2017-10-08 DIAGNOSIS — I2510 Atherosclerotic heart disease of native coronary artery without angina pectoris: Secondary | ICD-10-CM | POA: Diagnosis present

## 2017-10-08 DIAGNOSIS — I4891 Unspecified atrial fibrillation: Secondary | ICD-10-CM | POA: Diagnosis not present

## 2017-10-08 DIAGNOSIS — I082 Rheumatic disorders of both aortic and tricuspid valves: Secondary | ICD-10-CM | POA: Diagnosis present

## 2017-10-08 DIAGNOSIS — I9589 Other hypotension: Secondary | ICD-10-CM | POA: Diagnosis present

## 2017-10-08 DIAGNOSIS — Y92019 Unspecified place in single-family (private) house as the place of occurrence of the external cause: Secondary | ICD-10-CM | POA: Diagnosis not present

## 2017-10-08 DIAGNOSIS — Z951 Presence of aortocoronary bypass graft: Secondary | ICD-10-CM | POA: Diagnosis not present

## 2017-10-08 DIAGNOSIS — Z66 Do not resuscitate: Secondary | ICD-10-CM | POA: Diagnosis present

## 2017-10-08 DIAGNOSIS — K264 Chronic or unspecified duodenal ulcer with hemorrhage: Secondary | ICD-10-CM | POA: Diagnosis present

## 2017-10-08 DIAGNOSIS — K5730 Diverticulosis of large intestine without perforation or abscess without bleeding: Secondary | ICD-10-CM | POA: Diagnosis not present

## 2017-10-08 DIAGNOSIS — Z7902 Long term (current) use of antithrombotics/antiplatelets: Secondary | ICD-10-CM | POA: Diagnosis not present

## 2017-10-08 DIAGNOSIS — Z8673 Personal history of transient ischemic attack (TIA), and cerebral infarction without residual deficits: Secondary | ICD-10-CM | POA: Diagnosis not present

## 2017-10-08 DIAGNOSIS — K91840 Postprocedural hemorrhage and hematoma of a digestive system organ or structure following a digestive system procedure: Secondary | ICD-10-CM | POA: Diagnosis present

## 2017-10-08 DIAGNOSIS — Z7982 Long term (current) use of aspirin: Secondary | ICD-10-CM | POA: Diagnosis not present

## 2017-10-08 DIAGNOSIS — K8689 Other specified diseases of pancreas: Secondary | ICD-10-CM | POA: Diagnosis not present

## 2017-10-08 DIAGNOSIS — Z955 Presence of coronary angioplasty implant and graft: Secondary | ICD-10-CM | POA: Diagnosis not present

## 2017-10-08 HISTORY — PX: ESOPHAGOGASTRODUODENOSCOPY (EGD) WITH PROPOFOL: SHX5813

## 2017-10-08 LAB — CBC
HEMATOCRIT: 29.4 % — AB (ref 35.0–47.0)
HEMOGLOBIN: 10.1 g/dL — AB (ref 12.0–16.0)
MCH: 32.6 pg (ref 26.0–34.0)
MCHC: 34.3 g/dL (ref 32.0–36.0)
MCV: 94.9 fL (ref 80.0–100.0)
Platelets: 190 10*3/uL (ref 150–440)
RBC: 3.1 MIL/uL — AB (ref 3.80–5.20)
RDW: 14.1 % (ref 11.5–14.5)
WBC: 7.4 10*3/uL (ref 3.6–11.0)

## 2017-10-08 LAB — BASIC METABOLIC PANEL
ANION GAP: 5 (ref 5–15)
BUN: 42 mg/dL — ABNORMAL HIGH (ref 8–23)
CHLORIDE: 111 mmol/L (ref 98–111)
CO2: 23 mmol/L (ref 22–32)
CREATININE: 1.32 mg/dL — AB (ref 0.44–1.00)
Calcium: 8.7 mg/dL — ABNORMAL LOW (ref 8.9–10.3)
GFR, EST AFRICAN AMERICAN: 38 mL/min — AB (ref >60)
GFR, EST NON AFRICAN AMERICAN: 33 mL/min — AB (ref >60)
Glucose, Bld: 123 mg/dL — ABNORMAL HIGH (ref 70–99)
Potassium: 4.9 mmol/L (ref 3.5–5.1)
Sodium: 139 mmol/L (ref 135–145)

## 2017-10-08 LAB — LIPASE, BLOOD: Lipase: 41 U/L (ref 11–51)

## 2017-10-08 LAB — TSH: TSH: 1.544 u[IU]/mL (ref 0.350–4.500)

## 2017-10-08 SURGERY — ESOPHAGOGASTRODUODENOSCOPY (EGD) WITH PROPOFOL
Anesthesia: General

## 2017-10-08 MED ORDER — POTASSIUM CHLORIDE CRYS ER 10 MEQ PO TBCR: 10.0000 meq | EXTENDED_RELEASE_TABLET | ORAL | Status: DC

## 2017-10-08 MED ORDER — OXYCODONE HCL 5 MG PO TABS: 5.0000 mg | ORAL_TABLET | Freq: Once | ORAL | Status: DC | PRN

## 2017-10-08 MED ORDER — SODIUM CHLORIDE 0.9 % IV SOLN
INTRAVENOUS | Status: DC
  Administered 2017-10-08: 21:00:00 via INTRAVENOUS

## 2017-10-08 MED ORDER — PROPOFOL 10 MG/ML IV BOLUS
INTRAVENOUS | Status: AC
  Filled 2017-10-08: qty 20

## 2017-10-08 MED ORDER — FENTANYL CITRATE (PF) 100 MCG/2ML IJ SOLN: 25.0000 ug | INTRAMUSCULAR | Status: DC | PRN

## 2017-10-08 MED ORDER — OXYCODONE HCL 5 MG/5ML PO SOLN
5.0000 mg | Freq: Once | ORAL | Status: DC | PRN
  Filled 2017-10-08: qty 5

## 2017-10-08 MED ORDER — PANTOPRAZOLE SODIUM 40 MG PO TBEC
40.0000 mg | DELAYED_RELEASE_TABLET | Freq: Every day | ORAL | Status: DC
  Administered 2017-10-08 – 2017-10-09 (×2): 40 mg via ORAL
  Filled 2017-10-08 (×2): qty 1

## 2017-10-08 MED ORDER — ONDANSETRON HCL 4 MG/2ML IJ SOLN
4.0000 mg | Freq: Once | INTRAMUSCULAR | Status: AC
  Administered 2017-10-08: 4 mg via INTRAVENOUS

## 2017-10-08 MED ORDER — ONDANSETRON HCL 4 MG/2ML IJ SOLN: 4.0000 mg | Freq: Four times a day (QID) | INTRAMUSCULAR | Status: DC | PRN

## 2017-10-08 MED ORDER — ONDANSETRON HCL 4 MG/2ML IJ SOLN
INTRAMUSCULAR | Status: AC
  Filled 2017-10-08: qty 2

## 2017-10-08 MED ORDER — ACETAMINOPHEN 650 MG RE SUPP
650.0000 mg | Freq: Four times a day (QID) | RECTAL | Status: DC | PRN
  Filled 2017-10-08: qty 1

## 2017-10-08 MED ORDER — LIDOCAINE HCL (PF) 2 % IJ SOLN
INTRAMUSCULAR | Status: AC
  Filled 2017-10-08: qty 10

## 2017-10-08 MED ORDER — DOCUSATE SODIUM 100 MG PO CAPS
100.0000 mg | ORAL_CAPSULE | Freq: Two times a day (BID) | ORAL | Status: DC
  Administered 2017-10-08 – 2017-10-09 (×2): 100 mg via ORAL
  Filled 2017-10-08 (×2): qty 1

## 2017-10-08 MED ORDER — ACETAMINOPHEN 325 MG PO TABS: 650.0000 mg | ORAL_TABLET | Freq: Four times a day (QID) | ORAL | Status: DC | PRN

## 2017-10-08 MED ORDER — ONDANSETRON HCL 4 MG PO TABS: 4.0000 mg | ORAL_TABLET | Freq: Four times a day (QID) | ORAL | Status: DC | PRN

## 2017-10-08 MED ORDER — PROPOFOL 500 MG/50ML IV EMUL
INTRAVENOUS | Status: DC | PRN
  Administered 2017-10-08: 125 ug/kg/min via INTRAVENOUS

## 2017-10-08 MED ORDER — LIDOCAINE HCL (CARDIAC) PF 100 MG/5ML IV SOSY
PREFILLED_SYRINGE | INTRAVENOUS | Status: DC | PRN
  Administered 2017-10-08: 50 mg via INTRAVENOUS
  Administered 2017-10-08: 200 mg via INTRAVENOUS

## 2017-10-08 MED ORDER — NITROGLYCERIN 0.4 MG SL SUBL: 0.4000 mg | SUBLINGUAL_TABLET | SUBLINGUAL | Status: DC | PRN

## 2017-10-08 MED ORDER — SODIUM CHLORIDE 0.9 % IV SOLN
INTRAVENOUS | Status: DC
  Administered 2017-10-08 (×2): via INTRAVENOUS

## 2017-10-08 MED ORDER — PROPOFOL 10 MG/ML IV BOLUS
INTRAVENOUS | Status: DC | PRN
  Administered 2017-10-08: 40 mg via INTRAVENOUS
  Administered 2017-10-08: 30 mg via INTRAVENOUS

## 2017-10-08 NOTE — ED Notes (Signed)
Pt daughter came to desk and is frustrated that it is taking so long for the pt to get a room upstairs - attempted to explain the delay - she is also frustrated because the pt has an NPO status - she feels like the pt will become dehydrated and die - attempted to explain to pt daughter the process related to diet and potential for surgery

## 2017-10-08 NOTE — ED Provider Notes (Signed)
[facility] Emergency Department Provider Note ___   First MD Initiated Contact with Patient 10/08/17 0617     (approximate)  I have reviewed the triage vital signs and the nursing notes.   HISTORY  Chief Complaint Emesis    HPI Dawn Cooper is a 84 y.o. female with below list of chronic medical conditions presents to the emergency department with history of hematemesis and dark stools tonight following ERCP performed by Dr. Allen Norris yesterday secondary choledocholithiasis.  Patient states no abdominal pain at present.  Patient denies any fever.  Patient denies any nausea at present.   Past Medical History:  Diagnosis Date  . A-fib ([place])   . Broken leg    right  . Cardiomyopathy   . Chronic kidney disease   . GERD (gastroesophageal reflux disease)   . Heart murmur   . Hiatal hernia   . Hypertension   . Mild mitral regurgitation by prior echocardiogram   . Moderate aortic stenosis   . Myocardial infarction ([place])   . Stroke ([place])   . Tricuspid regurgitation     Patient Active Problem List   Diagnosis Date Noted  . Hematemesis 10/08/2017  . Common bile duct stone   . Bile duct obstruction   . Dementia without behavioral disturbance 09/15/2017  . Rotator cuff impingement syndrome of left shoulder 01/01/2017  . Skin lesion 01/01/2017  . History of ST elevation myocardial infarction   . Sleeping difficulty 07/01/2016  . B12 deficiency 04/01/2016  . Generalized weakness 09/13/2014  . CKD (chronic kidney disease), stage III ([place]) 06/16/2014  . History of stroke 06/02/2014  . Aortic valve stenosis, severe 05/05/2014  . Lightheadedness 03/22/2014  . Anemia 12/17/2013  . Gait instability 10/23/2011  . GERD (gastroesophageal reflux disease) 07/18/2011  . Pernicious anemia 03/13/2011  . Atrial fibrillation ([place]) 01/14/2011  . Edema 01/14/2011  . HTN (hypertension) 01/14/2011    Past Surgical History:  Procedure Laterality Date  .  APPENDECTOMY    . CATARACT EXTRACTION  2013   left  . CHOLECYSTECTOMY    . CORONARY ANGIOGRAPHY N/A 08/09/2016   Procedure: Coronary Angiography;  Surgeon: Burnell Blanks, MD;  Location: [place] [facility];  Service: Cardiovascular;  Laterality: N/A;  . CORONARY ANGIOPLASTY    . CORONARY ARTERY BYPASS GRAFT    . CORONARY STENT INTERVENTION N/A 08/09/2016   Procedure: Coronary Stent Intervention;  Surgeon: Burnell Blanks, MD;  Location: [place] [facility];  Service: Cardiovascular;  Laterality: N/A;  . EYE SURGERY    . FEMUR FRACTURE SURGERY  12/2011   Dr. Sabra Heck, s/p rehab at [facility]  . FRACTURE SURGERY    . GALLBLADDER SURGERY    . IR RADIOLOGY PERIPHERAL GUIDED IV START  07/29/2016  . IR US GUIDE VASC ACCESS RIGHT  07/29/2016  . VAGINAL DELIVERY      Prior to Admission medications   Medication Sig Start Date End Date Taking? Authorizing Provider  aspirin EC 81 MG tablet Take 1 tablet (81 mg total) by mouth daily. 09/10/14  Yes Demetrios Loll, MD  clopidogrel (PLAVIX) 75 MG tablet TAKE 1 TABLET EVERY OTHER DAY 07/14/17  Yes Leone Haven, MD  furosemide (LASIX) 20 MG tablet TAKE 1 TABLET EVERY OTHER DAY. 12/30/16  Yes Gollan, Kathlene November, MD  metoprolol tartrate (LOPRESSOR) 25 MG tablet TAKE 1/2 TABLET TWICE DAILY 09/10/17  Yes Gollan, Kathlene November, MD  nitroGLYCERIN (NITROSTAT) 0.4 MG SL tablet Place 1 tablet (0.4 mg total) under the tongue  every 5 (five) minutes x 3 doses as needed for chest pain. 08/14/16  Yes Bhagat, Bhavinkumar, PA  pantoprazole (PROTONIX) 40 MG tablet TAKE 1 TABLET EVERY DAY 04/23/17  Yes Leone Haven, MD  potassium chloride (K-DUR,KLOR-CON) 10 MEQ tablet TAKE 1 TABLET EVERY OTHER DAY 03/13/17  Yes Gollan, Kathlene November, MD    Allergies Dabigatran; Dabigatran etexilate mesylate; Clonidine; Clonidine derivatives; and Penicillins  Family History  Problem Relation Age of Onset  . Stroke Sister   . Cancer Brother        pancreatic  . Cancer Sister         pancreatic  . Diabetes Unknown   . Diabetes Brother   . Heart attack Neg Hx     Social History Social History   Tobacco Use  . Smoking status: Never Smoker  . Smokeless tobacco: Never Used  Substance Use Topics  . Alcohol use: Never    Frequency: Never  . Drug use: Never    Review of Systems Constitutional: No fever/chills Eyes: No visual changes. ENT: No sore throat. Cardiovascular: Denies chest pain. Respiratory: Denies shortness of breath. Gastrointestinal: Positive for hematemesis and melena   No diarrhea.  No constipation. Genitourinary: Negative for dysuria. Musculoskeletal: Negative for neck pain.  Negative for back pain. Integumentary: Negative for rash. Neurological: Negative for headaches, focal weakness or numbness.   ____________________________________________   PHYSICAL EXAM:  VITAL SIGNS: ED Triage Vitals [10/07/17 2316]  Enc Vitals Group     BP 122/60     Pulse Rate 76     Resp 20     Temp 98.6 F (37 C)     Temp Source Oral     SpO2 97 %     Weight 63.5 kg (140 lb)     Height 1.473 m (4\' 10" )     Head Circumference      Peak Flow      Pain Score 0     Pain Loc      Pain Edu?      Excl. in [place]?     Constitutional: Alert and oriented. Well appearing and in no acute distress. Eyes: Conjunctivae are normal.  Head: Atraumatic. Mouth/Throat: Mucous membranes are moist.  Oropharynx non-erythematous. Neck: No stridor.  Cardiovascular: Normal rate, regular rhythm. Good peripheral circulation. Grossly normal heart sounds. Respiratory: Normal respiratory effort.  No retractions. Lungs CTAB. Gastrointestinal: Soft and nontender. No distention.  Musculoskeletal: No lower extremity tenderness nor edema. No gross deformities of extremities. Neurologic:  Normal speech and language. No gross focal neurologic deficits are appreciated.  Skin:  Skin is warm, dry and intact. No rash noted. Psychiatric: Mood and affect are normal. Speech and behavior are  normal.  ____________________________________________   LABS (all labs ordered are listed, but only abnormal results are displayed)  Labs Reviewed  COMPREHENSIVE METABOLIC PANEL - Abnormal; Notable for the following components:      Result Value   Glucose, Bld 135 (*)    BUN 31 (*)    Creatinine, Ser 1.36 (*)    Total Protein 6.3 (*)    Albumin 2.9 (*)    Alkaline Phosphatase 143 (*)    Total Bilirubin 1.6 (*)    GFR calc non Af Amer 32 (*)    GFR calc Af Amer 37 (*)    All other components within normal limits  CBC - Abnormal; Notable for the following components:   RBC 3.72 (*)    All other components within normal limits  LIPASE,  BLOOD    RADIOLOGY I, [place] N BROWN, personally viewed and evaluated these images (plain radiographs) as part of my medical decision making, as well as reviewing the written report by the radiologist.  ED MD interpretation: No acute intra-abdominal or pelvic pathology per the radiologist  Official radiology report(s): Ct Abdomen Pelvis Wo Contrast  Result Date: 10/08/2017 CLINICAL DATA:  84 year old female with abdominal pain and vomiting after ERCP. EXAM: CT ABDOMEN AND PELVIS WITHOUT CONTRAST TECHNIQUE: Multidetector CT imaging of the abdomen and pelvis was performed following the standard protocol without IV contrast. COMPARISON:  MRI dated 10/03/2017 FINDINGS: Evaluation of this exam is limited in the absence of intravenous contrast. Lower chest: Partially visualized small left pleural effusion. There are diffuse chronic interstitial coarsening. Coronary vascular calcification as well as calcification of the aortic valve. No intra-abdominal free air or free fluid. Hepatobiliary: The liver is unremarkable. No intrahepatic biliary ductal dilatation. Cholecystectomy. There is pneumobilia. No retained calcified stone noted in the CBD. Mild dilatation of the common bile duct measuring 11 mm, likely post cholecystectomy. Pancreas: Unremarkable. No  pancreatic ductal dilatation or surrounding inflammatory changes. Spleen: Small calcified splenic granuloma. The spleen is otherwise unremarkable. Adrenals/Urinary Tract: The adrenal glands, kidneys, visualized ureters, and urinary bladder appear unremarkable. Stomach/Bowel: There is sigmoid diverticulosis without active inflammatory changes. A 2.5 cm distal duodenal diverticulum. There is a small hiatal hernia. There is no bowel obstruction or active inflammation. Appendectomy. Vascular/Lymphatic: Advanced aortoiliac atherosclerotic disease. The aorta is tortuous. There is a 2.5 cm infrarenal aortic ectasia. The IVC is grossly unremarkable. Evaluation of the vasculature is limited in the absence of intravenous contrast. No portal venous gas. There is no adenopathy. Reproductive: The uterus and ovaries are grossly unremarkable. No pelvic mass. Other: Small fat containing umbilical hernia. Musculoskeletal: Osteopenia with degenerative changes and disc desiccation. No acute osseous pathology. Right femoral intra measure rod and transcervical fixation screw. No acute osseous pathology. IMPRESSION: 1. No acute intra-abdominal or pelvic pathology. No pneumoperitoneum or evidence of bowel perforation. 2. Colonic diverticulosis. No bowel obstruction or active inflammation. 3.  Aortic Atherosclerosis (ICD10-I70.0). 4. A 2.5 cm infrarenal aortic ectasia. Ectatic abdominal aorta at risk for aneurysm development. Recommend followup by ultrasound in 5 years. This recommendation follows ACR consensus guidelines: White Paper of the ACR Incidental Findings Committee II on Vascular Findings. J Am Coll Radiol 2013; 10:789-794. Electronically Signed   By: Anner Crete M.D.   On: 10/08/2017 04:56   Dg C-arm 1-60 Min-no Report  Result Date: 10/07/2017 Fluoroscopy was utilized by the requesting physician.  No radiographic interpretation.      Procedures   ____________________________________________   INITIAL  IMPRESSION / ASSESSMENT AND PLAN / ED COURSE  As part of my medical decision making, I reviewed the following data within the electronic MEDICAL RECORD NUMBER   84 year old female presenting with above-stated history and physical exam concerning for GI bleed following ERCP.Marland Kitchen  Patient's laboratory data revealed no evidence of anemia.  CT scan revealed no acute intra-abdominal pathology.  Patient discussed with Dr. Allen Norris gastroenterologist who advised that during the ERCP procedure the patient did have some bleeding following the sphincterotomy.  As such Dr. Allen Norris recommended hospital admission for observation with possible endoscopy today. ____________________________________________  FINAL CLINICAL IMPRESSION(S) / ED DIAGNOSES  Final diagnoses:  Gastrointestinal hemorrhage with hematemesis     MEDICATIONS GIVEN DURING THIS VISIT:  Medications  ondansetron (ZOFRAN) injection 4 mg (4 mg Intravenous Given 10/08/17 0538)     ED Discharge Orders  None       Note:  This document was prepared using Dragon voice recognition software and may include unintentional dictation errors.    Gregor Hams, MD 10/08/17 [phone]

## 2017-10-08 NOTE — Anesthesia Procedure Notes (Signed)
Date/Time: 10/08/2017 11:35 AM Performed by: Johnna Acosta, CRNA Pre-anesthesia Checklist: Patient identified, Emergency Drugs available, Suction available, Patient being monitored and Timeout performed Patient Re-evaluated:Patient Re-evaluated prior to induction Oxygen Delivery Method: Nasal cannula Preoxygenation: Pre-oxygenation with 100% oxygen

## 2017-10-08 NOTE — Consult Note (Signed)
Vonda Antigua, MD [address], [place], [place], [place], [address] [address], [place], [place], [place], [address] Phone: [phone]  Fax: [phone]  Consultation  Referring Provider:     Dr. Posey Pronto Primary Care Physician:  Leone Haven, MD Reason for Consultation:     Hematemesis  Date of Admission:  10/08/2017 Date of Consultation:  10/08/2017         HPI:   Dawn Cooper is a 84 y.o. female who presents with 1 day history of hematemesis.  Patient underwent an outpatient ERCP yesterday, 10/07/2017 due to choledocholithiasis seen on MRCP on October 03, 2017.  See detailed report by Dr. Allen Norris who performed the ERCP.  Major papilla was found to be located within a diverticulum.  Choledocholithiasis was found, complete removal was accomplished by biliary sphincterotomy and balloon extraction.  Patient is on Plavix at home and had 1 episode of hematemesis and returned to the ER.  No abdominal pain, nausea or vomiting, melena, hematochezia.  Past Medical History:  Diagnosis Date  . A-fib ([place])   . Broken leg    right  . Cardiomyopathy   . Chronic kidney disease   . GERD (gastroesophageal reflux disease)   . Heart murmur   . Hiatal hernia   . Hypertension   . Mild mitral regurgitation by prior echocardiogram   . Moderate aortic stenosis   . Myocardial infarction ([place])   . Stroke ([place])   . Tricuspid regurgitation     Past Surgical History:  Procedure Laterality Date  . APPENDECTOMY    . CATARACT EXTRACTION  2013   left  . CHOLECYSTECTOMY    . CORONARY ANGIOGRAPHY N/A 08/09/2016   Procedure: Coronary Angiography;  Surgeon: Burnell Blanks, MD;  Location: [place] [facility];  Service: Cardiovascular;  Laterality: N/A;  . CORONARY ANGIOPLASTY    . CORONARY ARTERY BYPASS GRAFT    . CORONARY STENT INTERVENTION N/A 08/09/2016   Procedure: Coronary Stent Intervention;  Surgeon: Burnell Blanks, MD;  Location: [place] [facility];  Service:  Cardiovascular;  Laterality: N/A;  . ERCP N/A 10/07/2017   Procedure: ENDOSCOPIC RETROGRADE CHOLANGIOPANCREATOGRAPHY (ERCP);  Surgeon: Lucilla Lame, MD;  Location: [facility] ENDOSCOPY;  Service: Endoscopy;  Laterality: N/A;  . EYE SURGERY    . FEMUR FRACTURE SURGERY  12/2011   Dr. Sabra Heck, s/p rehab at [facility]  . FRACTURE SURGERY    . GALLBLADDER SURGERY    . IR RADIOLOGY PERIPHERAL GUIDED IV START  07/29/2016  . IR US GUIDE VASC ACCESS RIGHT  07/29/2016  . VAGINAL DELIVERY      Prior to Admission medications   Medication Sig Start Date End Date Taking? Authorizing Provider  aspirin EC 81 MG tablet Take 1 tablet (81 mg total) by mouth daily. 09/10/14  Yes Demetrios Loll, MD  clopidogrel (PLAVIX) 75 MG tablet TAKE 1 TABLET EVERY OTHER DAY 07/14/17  Yes Leone Haven, MD  furosemide (LASIX) 20 MG tablet TAKE 1 TABLET EVERY OTHER DAY. 12/30/16  Yes Gollan, Kathlene November, MD  metoprolol tartrate (LOPRESSOR) 25 MG tablet TAKE 1/2 TABLET TWICE DAILY 09/10/17  Yes Gollan, Kathlene November, MD  nitroGLYCERIN (NITROSTAT) 0.4 MG SL tablet Place 1 tablet (0.4 mg total) under the tongue every 5 (five) minutes x 3 doses as needed for chest pain. 08/14/16  Yes Bhagat, Bhavinkumar, PA  pantoprazole (PROTONIX) 40 MG tablet TAKE 1 TABLET EVERY DAY 04/23/17  Yes Leone Haven, MD  potassium chloride (K-DUR,KLOR-CON) 10 MEQ tablet TAKE  1 TABLET EVERY OTHER DAY 03/13/17  Yes Gollan, Kathlene November, MD    Family History  Problem Relation Age of Onset  . Stroke Sister   . Cancer Brother        pancreatic  . Cancer Sister        pancreatic  . Diabetes Unknown   . Diabetes Brother   . Heart attack Neg Hx      Social History   Tobacco Use  . Smoking status: Never Smoker  . Smokeless tobacco: Never Used  Substance Use Topics  . Alcohol use: Never    Frequency: Never  . Drug use: Never    Allergies as of 10/07/2017 - Review Complete 10/07/2017  Allergen Reaction Noted  . Dabigatran Other (See Comments) 06/07/2014  .  Dabigatran etexilate mesylate Other (See Comments) 09/09/2014  . Clonidine Itching and Rash 09/23/2012  . Clonidine derivatives Itching and Rash 09/23/2012  . Penicillins Itching, Rash, and Other (See Comments) 01/14/2011    Review of Systems:    All systems reviewed and negative except where noted in HPI.   Physical Exam:  Vital signs in last 24 hours: Vitals:   10/08/17 1203 10/08/17 1243 10/08/17 1253 10/08/17 1325  BP: 109/83   104/66  Pulse:    71  Resp:  16 16 16   Temp:    (!) 97.4 F (36.3 C)  TempSrc:    Oral  SpO2:    100%  Weight:      Height:         General:   Pleasant, cooperative in NAD Head:  Normocephalic and atraumatic. Eyes:   No icterus.   Conjunctiva pink. PERRLA. Ears:  Normal auditory acuity. Neck:  Supple; no masses or thyroidomegaly Lungs: Respirations even and unlabored. Lungs clear to auscultation bilaterally.   No wheezes, crackles, or rhonchi.  Abdomen:  Soft, nondistended, nontender. Normal bowel sounds. No appreciable masses or hepatomegaly.  No rebound or guarding.  Neurologic:  Alert and oriented x3;  grossly normal neurologically. Skin:  Intact without significant lesions or rashes. Cervical Nodes:  No significant cervical adenopathy. Psych:  Alert and cooperative. Normal affect.  LAB RESULTS: Recent Labs    10/07/17 2318 10/08/17 1036  WBC 8.2 7.4  HGB 12.1 10.1*  HCT 35.0 29.4*  PLT 206 190   BMET Recent Labs    10/07/17 2318 10/08/17 1036  NA 139 139  K 4.9 4.9  CL 110 111  CO2 22 23  GLUCOSE 135* 123*  BUN 31* 42*  CREATININE 1.36* 1.32*  CALCIUM 9.0 8.7*   LFT Recent Labs    10/07/17 2318  PROT 6.3*  ALBUMIN 2.9*  AST 34  ALT 22  ALKPHOS 143*  BILITOT 1.6*   PT/INR No results for input(s): LABPROT, INR in the last 72 hours.  STUDIES: Ct Abdomen Pelvis Wo Contrast  Result Date: 10/08/2017 CLINICAL DATA:  84 year old female with abdominal pain and vomiting after ERCP. EXAM: CT ABDOMEN AND PELVIS WITHOUT  CONTRAST TECHNIQUE: Multidetector CT imaging of the abdomen and pelvis was performed following the standard protocol without IV contrast. COMPARISON:  MRI dated 10/03/2017 FINDINGS: Evaluation of this exam is limited in the absence of intravenous contrast. Lower chest: Partially visualized small left pleural effusion. There are diffuse chronic interstitial coarsening. Coronary vascular calcification as well as calcification of the aortic valve. No intra-abdominal free air or free fluid. Hepatobiliary: The liver is unremarkable. No intrahepatic biliary ductal dilatation. Cholecystectomy. There is pneumobilia. No retained calcified stone noted in  the CBD. Mild dilatation of the common bile duct measuring 11 mm, likely post cholecystectomy. Pancreas: Unremarkable. No pancreatic ductal dilatation or surrounding inflammatory changes. Spleen: Small calcified splenic granuloma. The spleen is otherwise unremarkable. Adrenals/Urinary Tract: The adrenal glands, kidneys, visualized ureters, and urinary bladder appear unremarkable. Stomach/Bowel: There is sigmoid diverticulosis without active inflammatory changes. A 2.5 cm distal duodenal diverticulum. There is a small hiatal hernia. There is no bowel obstruction or active inflammation. Appendectomy. Vascular/Lymphatic: Advanced aortoiliac atherosclerotic disease. The aorta is tortuous. There is a 2.5 cm infrarenal aortic ectasia. The IVC is grossly unremarkable. Evaluation of the vasculature is limited in the absence of intravenous contrast. No portal venous gas. There is no adenopathy. Reproductive: The uterus and ovaries are grossly unremarkable. No pelvic mass. Other: Small fat containing umbilical hernia. Musculoskeletal: Osteopenia with degenerative changes and disc desiccation. No acute osseous pathology. Right femoral intra measure rod and transcervical fixation screw. No acute osseous pathology. IMPRESSION: 1. No acute intra-abdominal or pelvic pathology. No  pneumoperitoneum or evidence of bowel perforation. 2. Colonic diverticulosis. No bowel obstruction or active inflammation. 3.  Aortic Atherosclerosis (ICD10-I70.0). 4. A 2.5 cm infrarenal aortic ectasia. Ectatic abdominal aorta at risk for aneurysm development. Recommend followup by ultrasound in 5 years. This recommendation follows ACR consensus guidelines: White Paper of the ACR Incidental Findings Committee II on Vascular Findings. J Am Coll Radiol 2013; 10:789-794. Electronically Signed   By: Anner Crete M.D.   On: 10/08/2017 04:56   Dg C-arm 1-60 Min-no Report  Result Date: 10/07/2017 Fluoroscopy was utilized by the requesting physician.  No radiographic interpretation.      Impression / Plan:   Dawn Cooper is a 84 y.o. y/o female with choledocholithiasis on MRCP on July 12, on Plavix at home, and underwent ERCP on July 16 with biliary sphincterotomy and stone extraction , with subsequent GI bleed  Patient is now status post EGD today, October 08, 2017 by Dr. Allen Norris,  for GI bleed See procedure report for detailed findings 1 superficial duodenal ulcer with pigmented material was seen.  This was likely at the site of the biliary sphincterotomy.  This was treated with coagulation and patient has been stable since then.  No episodes of emesis or hematemesis since presentation to hospital.  PPI IV twice daily  Continue serial CBCs and transfuse PRN Avoid NSAIDs Maintain 2 large-bore IV lines Please page GI with any acute hemodynamic changes, or signs of active GI bleeding  Patient is hemodynamically stable We will continue to follow  Thank you for involving me in the care of this patient.      LOS: 0 days   Virgel Manifold, MD  10/08/2017, 2:28 PM

## 2017-10-08 NOTE — ED Notes (Addendum)
BM - black in color and thick loose stool

## 2017-10-08 NOTE — Progress Notes (Signed)
Advanced care plan.  Purpose of the Encounter: CODE STATUS  Parties in Attendance: Patient herself and daughter  Patient's Decision Capacity: Intact  Subjective/Patient's story: The patient with past medical history of atrial fibrillation, hypertension, CAD status post MI and stroke presents to the emergency department planing of hematemesis.  Patient had a ERCP with sphincterectomy yesterday.  Now admitted with hematemesis     Objective/Medical story I discussed with patient and her daughter regarding her desires for resuscitation based on her advanced age   Goals of care determination: DNR    CODE STATUS: DNR   Time spent discussing advanced care planning: 16 minutes

## 2017-10-08 NOTE — H&P (Signed)
Dawn Cooper is an 84 y.o. female.   Chief Complaint: Vomiting HPI: The patient with past medical history of atrial fibrillation, hypertension, CAD status post MI and stroke presents to the emergency department planing of hematemesis.  The patient underwent ERCP yesterday for gallstones lodged within the common bile duct remnant and intrahepatic ducts.  (She is status post cholecystectomy) she underwent sphincterotomy but has developed some bleeding.  She is hemodynamically stable and her abdominal pain has subsided but due to risk of ongoing bleeding the emergency department staff called the hospitalist service for admission.  Past Medical History:  Diagnosis Date  . A-fib ([place])   . Broken leg    right  . Cardiomyopathy   . Chronic kidney disease   . GERD (gastroesophageal reflux disease)   . Heart murmur   . Hiatal hernia   . Hypertension   . Mild mitral regurgitation by prior echocardiogram   . Moderate aortic stenosis   . Myocardial infarction ([place])   . Stroke ([place])   . Tricuspid regurgitation     Past Surgical History:  Procedure Laterality Date  . APPENDECTOMY    . CATARACT EXTRACTION  2013   left  . CHOLECYSTECTOMY    . CORONARY ANGIOGRAPHY N/A 08/09/2016   Procedure: Coronary Angiography;  Surgeon: Burnell Blanks, MD;  Location: [place] [facility];  Service: Cardiovascular;  Laterality: N/A;  . CORONARY ANGIOPLASTY    . CORONARY ARTERY BYPASS GRAFT    . CORONARY STENT INTERVENTION N/A 08/09/2016   Procedure: Coronary Stent Intervention;  Surgeon: Burnell Blanks, MD;  Location: [place] [facility];  Service: Cardiovascular;  Laterality: N/A;  . EYE SURGERY    . FEMUR FRACTURE SURGERY  12/2011   Dr. Sabra Heck, s/p rehab at [facility]  . FRACTURE SURGERY    . GALLBLADDER SURGERY    . IR RADIOLOGY PERIPHERAL GUIDED IV START  07/29/2016  . IR US GUIDE VASC ACCESS RIGHT  07/29/2016  . VAGINAL DELIVERY      Family History  Problem Relation Age of Onset  .  Stroke Sister   . Cancer Brother        pancreatic  . Cancer Sister        pancreatic  . Diabetes Unknown   . Diabetes Brother   . Heart attack Neg Hx    Social History:  reports that she has never smoked. She has never used smokeless tobacco. She reports that she does not drink alcohol or use drugs.  Allergies:  Allergies  Allergen Reactions  . Dabigatran Other (See Comments)    Internal Bleeding Other reaction(s): Other (See Comments) Internal Bleeding   . Dabigatran Etexilate Mesylate Other (See Comments)    Reaction:  Bleeding  Other reaction(s): Other (See Comments) Reaction:Bleeding   . Clonidine Itching and Rash  . Clonidine Derivatives Itching and Rash  . Penicillins Itching, Rash and Other (See Comments)    Other reaction(s): Other (See Comments)      (Not in a hospital admission)  Results for orders placed or performed during the hospital encounter of 10/08/17 (from the past 48 hour(s))  Comprehensive metabolic panel     Status: Abnormal   Collection Time: 10/07/17 11:18 PM  Result Value Ref Range   Sodium 139 135 - 145 mmol/L   Potassium 4.9 3.5 - 5.1 mmol/L   Chloride 110 98 - 111 mmol/L    Comment: Please note change in reference range.   CO2 22 22 - 32 mmol/L   Glucose,  Bld 135 (H) 70 - 99 mg/dL    Comment: Please note change in reference range.   BUN 31 (H) 8 - 23 mg/dL    Comment: Please note change in reference range.   Creatinine, Ser 1.36 (H) 0.44 - 1.00 mg/dL   Calcium 9.0 8.9 - 10.3 mg/dL   Total Protein 6.3 (L) 6.5 - 8.1 g/dL   Albumin 2.9 (L) 3.5 - 5.0 g/dL   AST 34 15 - 41 U/L   ALT 22 0 - 44 U/L    Comment: Please note change in reference range.   Alkaline Phosphatase 143 (H) 38 - 126 U/L   Total Bilirubin 1.6 (H) 0.3 - 1.2 mg/dL   GFR calc non Af Amer 32 (L) >60 mL/min   GFR calc Af Amer 37 (L) >60 mL/min    Comment: (NOTE) The eGFR has been calculated using the CKD EPI equation. This calculation has not been validated in all  clinical situations. eGFR's persistently <60 mL/min signify possible Chronic Kidney Disease.    Anion gap 7 5 - 15    Comment: Performed at [facility], [address] [place], [place] [address]  CBC     Status: Abnormal   Collection Time: 10/07/17 11:18 PM  Result Value Ref Range   WBC 8.2 3.6 - 11.0 K/uL   RBC 3.72 (L) 3.80 - 5.20 MIL/uL   Hemoglobin 12.1 12.0 - 16.0 g/dL   HCT 35.0 35.0 - 47.0 %   MCV 93.9 80.0 - 100.0 fL   MCH 32.4 26.0 - 34.0 pg   MCHC 34.6 32.0 - 36.0 g/dL   RDW 13.8 11.5 - 14.5 %   Platelets 206 150 - 440 K/uL    Comment: Performed at [facility], [address] [place], [place] [address]  Lipase, blood     Status: None   Collection Time: 10/07/17 11:18 PM  Result Value Ref Range   Lipase 41 11 - 51 U/L    Comment: Performed at [facility], [place], [place] [address]   Ct Abdomen Pelvis Wo Contrast  Result Date: 10/08/2017 CLINICAL DATA:  84 year old female with abdominal pain and vomiting after ERCP. EXAM: CT ABDOMEN AND PELVIS WITHOUT CONTRAST TECHNIQUE: Multidetector CT imaging of the abdomen and pelvis was performed following the standard protocol without IV contrast. COMPARISON:  MRI dated 10/03/2017 FINDINGS: Evaluation of this exam is limited in the absence of intravenous contrast. Lower chest: Partially visualized small left pleural effusion. There are diffuse chronic interstitial coarsening. Coronary vascular calcification as well as calcification of the aortic valve. No intra-abdominal free air or free fluid. Hepatobiliary: The liver is unremarkable. No intrahepatic biliary ductal dilatation. Cholecystectomy. There is pneumobilia. No retained calcified stone noted in the CBD. Mild dilatation of the common bile duct measuring 11 mm, likely post cholecystectomy. Pancreas: Unremarkable. No pancreatic ductal dilatation or surrounding inflammatory changes. Spleen: Small calcified splenic granuloma. The spleen  is otherwise unremarkable. Adrenals/Urinary Tract: The adrenal glands, kidneys, visualized ureters, and urinary bladder appear unremarkable. Stomach/Bowel: There is sigmoid diverticulosis without active inflammatory changes. A 2.5 cm distal duodenal diverticulum. There is a small hiatal hernia. There is no bowel obstruction or active inflammation. Appendectomy. Vascular/Lymphatic: Advanced aortoiliac atherosclerotic disease. The aorta is tortuous. There is a 2.5 cm infrarenal aortic ectasia. The IVC is grossly unremarkable. Evaluation of the vasculature is limited in the absence of intravenous contrast. No portal venous gas. There is no adenopathy. Reproductive: The uterus and ovaries are grossly  unremarkable. No pelvic mass. Other: Small fat containing umbilical hernia. Musculoskeletal: Osteopenia with degenerative changes and disc desiccation. No acute osseous pathology. Right femoral intra measure rod and transcervical fixation screw. No acute osseous pathology. IMPRESSION: 1. No acute intra-abdominal or pelvic pathology. No pneumoperitoneum or evidence of bowel perforation. 2. Colonic diverticulosis. No bowel obstruction or active inflammation. 3.  Aortic Atherosclerosis (ICD10-I70.0). 4. A 2.5 cm infrarenal aortic ectasia. Ectatic abdominal aorta at risk for aneurysm development. Recommend followup by ultrasound in 5 years. This recommendation follows ACR consensus guidelines: White Paper of the ACR Incidental Findings Committee II on Vascular Findings. J Am Coll Radiol 2013; 10:789-794. Electronically Signed   By: Anner Crete M.D.   On: 10/08/2017 04:56   Dg C-arm 1-60 Min-no Report  Result Date: 10/07/2017 Fluoroscopy was utilized by the requesting physician.  No radiographic interpretation.    Review of Systems  Constitutional: Negative for chills and fever.  HENT: Negative for sore throat and tinnitus.   Eyes: Negative for blurred vision and redness.  Respiratory: Negative for cough and  shortness of breath.   Cardiovascular: Negative for chest pain, palpitations, orthopnea and PND.  Gastrointestinal: Positive for abdominal pain, blood in stool, melena, nausea and vomiting. Negative for diarrhea.  Genitourinary: Negative for dysuria, frequency and urgency.  Musculoskeletal: Negative for joint pain and myalgias.  Skin: Negative for rash.       No lesions  Neurological: Negative for speech change, focal weakness and weakness.  Endo/Heme/Allergies: Does not bruise/bleed easily.       No temperature intolerance  Psychiatric/Behavioral: Negative for depression and suicidal ideas.    Blood pressure (!) 113/59, pulse 62, temperature 98.6 F (37 C), temperature source Oral, resp. rate 20, height _0  (1.473 m), weight 63.5 kg (140 lb), SpO2 97 %. Physical Exam  Vitals reviewed. Constitutional: She is oriented to person, place, and time. She appears well-developed and well-nourished. No distress.  HENT:  Head: Normocephalic and atraumatic.  Mouth/Throat: Oropharynx is clear and moist.  Eyes: Pupils are equal, round, and reactive to light. Conjunctivae and EOM are normal. No scleral icterus.  Neck: Normal range of motion. Neck supple. No JVD present. No tracheal deviation present. No thyromegaly present.  Cardiovascular: Normal rate, regular rhythm and normal heart sounds. Exam reveals no gallop and no friction rub.  No murmur heard. Respiratory: Effort normal and breath sounds normal.  GI: Soft. Bowel sounds are normal. She exhibits no distension. There is no tenderness.  Genitourinary:  Genitourinary Comments: Deferred  Musculoskeletal: Normal range of motion. She exhibits no edema.  Lymphadenopathy:    She has no cervical adenopathy.  Neurological: She is alert and oriented to person, place, and time. No cranial nerve deficit. She exhibits normal muscle tone.  Skin: Skin is warm and dry. No rash noted. No erythema. There is pallor.  Psychiatric: She has a normal mood and  affect. Her behavior is normal. Judgment and thought content normal.     Assessment/Plan This is a 84 year old female admitted for hematemesis. 1.  Hematemesis: Consult gastroenterology for possible repeat endoscopy to achieve hemostasis. 2.  Atrial fibrillation: Rate controlled; off anticoagulations for now.  Continue to monitor telemetry. 3.  Hypertension: Controlled; continue metoprolol.  H&H is good. 4.  CKD: Stage III; avoid nephrotoxic agents. 5.  DVT prophylaxis: SCDs 6.  GI prophylaxis: None The patient is a full code.  Time spent on admission orders and patient care approximately 45 minutes  Harrie Foreman, MD 10/08/2017, 8:40 AM

## 2017-10-08 NOTE — ED Notes (Addendum)
Pt reports increased weakness and dizziness - pt remains alert and oriented and is able to ambulate to toilet with assistance - paged admitted doctor to discuss symptoms and decrease in BP

## 2017-10-08 NOTE — Anesthesia Post-op Follow-up Note (Signed)
Anesthesia QCDR form completed.        

## 2017-10-08 NOTE — ED Notes (Signed)
BM - black in color and thick loose stool

## 2017-10-08 NOTE — Anesthesia Postprocedure Evaluation (Signed)
Anesthesia Post Note  Patient: Dawn Cooper  Procedure(s) Performed: ESOPHAGOGASTRODUODENOSCOPY (EGD) WITH PROPOFOL (N/A )  Patient location during evaluation: Endoscopy Anesthesia Type: General Level of consciousness: awake and alert Pain management: pain level controlled Vital Signs Assessment: post-procedure vital signs reviewed and stable Respiratory status: spontaneous breathing, nonlabored ventilation, respiratory function stable and patient connected to nasal cannula oxygen Cardiovascular status: blood pressure returned to baseline and stable Postop Assessment: no apparent nausea or vomiting Anesthetic complications: no     Last Vitals:  Vitals:   10/08/17 1153 10/08/17 1203  BP: 100/71 109/83  Pulse: 83   Resp: (!) 26   Temp: (!) 36.2 C   SpO2: 100%     Last Pain:  Vitals:   10/08/17 1233  TempSrc:   PainSc: 0-No pain                 Precious Haws Piscitello

## 2017-10-08 NOTE — Transfer of Care (Signed)
Immediate Anesthesia Transfer of Care Note  Patient: Dawn Cooper  Procedure(s) Performed: ESOPHAGOGASTRODUODENOSCOPY (EGD) WITH PROPOFOL (N/A )  Patient Location: PACU  Anesthesia Type:General  Level of Consciousness: sedated  Airway & Oxygen Therapy: Patient Spontanous Breathing and Patient connected to nasal cannula oxygen  Post-op Assessment: Report given to RN and Post -op Vital signs reviewed and stable  Post vital signs: Reviewed and stable  Last Vitals:  Vitals Value Taken Time  BP 100/71 10/08/2017 11:54 AM  Temp 36.2 C 10/08/2017 11:53 AM  Pulse 45 10/08/2017 11:55 AM  Resp 16 10/08/2017 11:55 AM  SpO2 100 % 10/08/2017 11:55 AM  Vitals shown include unvalidated device data.  Last Pain:  Vitals:   10/08/17 1153  TempSrc: Temporal  PainSc:          Complications: No apparent anesthesia complications

## 2017-10-08 NOTE — ED Notes (Signed)
Leslie from Endo just called and requested that pt be brought to Endo for procedure and consent will be signed in [place] RN notified - Dorian ED Tech to transport pt

## 2017-10-08 NOTE — Op Note (Signed)
[facility] Gastroenterology Patient Name: Dawn Cooper Procedure Date: 10/08/2017 11:20 AM MRN: [id number] Account #: [id number] Date of Birth: 1918/06/04 Admit Type: Inpatient Age: 84 Room: [facility] ENDO ROOM 4 Gender: Female Note Status: Finalized Procedure:            Upper GI endoscopy Indications:          Recent gastrointestinal bleeding, Suspected upper                        gastrointestinal bleeding Providers:            Lucilla Lame MD, MD Referring MD:         Angela Adam. Caryl Bis (Referring MD) Medicines:            Propofol per Anesthesia Complications:        No immediate complications. Procedure:            Pre-Anesthesia Assessment:                       - Prior to the procedure, a History and Physical was                        performed, and patient medications and allergies were                        reviewed. The patient's tolerance of previous                        anesthesia was also reviewed. The risks and benefits of                        the procedure and the sedation options and risks were                        discussed with the patient. All questions were                        answered, and informed consent was obtained. Prior                        Anticoagulants: The patient has taken Plavix                        (clopidogrel), last dose was 5 days prior to procedure.                        ASA Grade Assessment: III - A patient with severe                        systemic disease. After reviewing the risks and                        benefits, the patient was deemed in satisfactory                        condition to undergo the procedure.                       After obtaining informed consent, the endoscope was  passed under direct vision. Throughout the procedure,                        the patient's blood pressure, pulse, and oxygen                        saturations were monitored continuously. The                 Duodenoscope was introduced through the mouth, and                        advanced to the second part of duodenum. The upper GI                        endoscopy was accomplished without difficulty. The                        patient tolerated the procedure well. Findings:      The examined esophagus was normal.      The stomach was normal.      One non-bleeding superficial duodenal ulcer with pigmented material was       found in the ampulla. Coagulation for hemostasis using bipolar probe was       successful. Impression:           - Normal esophagus.                       - Normal stomach.                       - One non-bleeding duodenal ulcer with pigmented                        material. Treated with bipolar cautery.                       - No specimens collected.                       - Post sphincterotomy bleed. Recommendation:       - Return patient to hospital ward for ongoing care.                       - Clear liquid diet. Procedure Code(s):    --- Professional ---                       [phone], Esophagogastroduodenoscopy, flexible, transoral;                        with control of bleeding, any method Diagnosis Code(s):    --- Professional ---                       K92.2, Gastrointestinal hemorrhage, unspecified                       K26.9, Duodenal ulcer, unspecified as acute or chronic,                        without hemorrhage or perforation CPT copyright 2017 American Medical Association. All rights reserved. The codes documented in this report are preliminary and upon coder  review may  be revised to meet current compliance requirements. Lucilla Lame MD, MD 10/08/2017 11:56:50 AM This report has been signed electronically. Number of Addenda: 0 Note Initiated On: 10/08/2017 11:20 AM      [facility]

## 2017-10-08 NOTE — Anesthesia Preprocedure Evaluation (Signed)
Anesthesia Evaluation  Patient identified by MRN, date of birth, ID band Patient awake and Patient confused    Reviewed: Allergy & Precautions, H&P , NPO status , Patient's Chart, lab work & pertinent test results  History of Anesthesia Complications Negative for: history of anesthetic complications  Airway Mallampati: III  TM Distance: <3 FB Neck ROM: limited    Dental  (+) Poor Dentition, Missing   Pulmonary neg shortness of breath,           Cardiovascular hypertension, (-) angina+ CAD and + Past MI  + Valvular Problems/Murmurs      Neuro/Psych PSYCHIATRIC DISORDERS Dementia  Neuromuscular disease CVA    GI/Hepatic Neg liver ROS, hiatal hernia, PUD, GERD  Medicated and Controlled,  Endo/Other  negative endocrine ROS  Renal/GU Renal disease  negative genitourinary   Musculoskeletal   Abdominal   Peds  Hematology negative hematology ROS (+)   Anesthesia Other Findings Past Medical History: No date: A-fib ([facility]) No date: Broken leg     Comment:  right No date: Cardiomyopathy No date: Chronic kidney disease No date: GERD (gastroesophageal reflux disease) No date: Heart murmur No date: Hiatal hernia No date: Hypertension No date: Mild mitral regurgitation by prior echocardiogram No date: Moderate aortic stenosis No date: Myocardial infarction ([place]) No date: Stroke [facility]) No date: Tricuspid regurgitation  Past Surgical History: No date: APPENDECTOMY 2013: CATARACT EXTRACTION     Comment:  left No date: CHOLECYSTECTOMY 08/09/2016: CORONARY ANGIOGRAPHY; N/A     Comment:  Procedure: Coronary Angiography;  Surgeon: Burnell Blanks, MD;  Location: [place] [facility];                Service: Cardiovascular;  Laterality: N/A; No date: CORONARY ANGIOPLASTY No date: CORONARY ARTERY BYPASS GRAFT 08/09/2016: CORONARY STENT INTERVENTION; N/A     Comment:  Procedure: Coronary Stent Intervention;   Surgeon:               Burnell Blanks, MD;  Location: [place] [facility]               [facility];  Service: Cardiovascular;  Laterality: N/A; 10/07/2017: ERCP; N/A     Comment:  Procedure: ENDOSCOPIC RETROGRADE               CHOLANGIOPANCREATOGRAPHY (ERCP);  Surgeon: Lucilla Lame,               MD;  Location: [facility] ENDOSCOPY;  Service: Endoscopy;                Laterality: N/A; No date: EYE SURGERY 12/2011: FEMUR FRACTURE SURGERY     Comment:  Dr. Sabra Heck, s/p rehab at [facility] No date: FRACTURE SURGERY No date: GALLBLADDER SURGERY 07/29/2016: IR RADIOLOGY PERIPHERAL GUIDED IV START 07/29/2016: IR US GUIDE VASC ACCESS RIGHT No date: VAGINAL DELIVERY  BMI    Body Mass Index:  29.26 kg/m      Reproductive/Obstetrics negative OB ROS                             Anesthesia Physical Anesthesia Plan  ASA: IV  Anesthesia Plan: General   Post-op Pain Management:    Induction: Intravenous  PONV Risk Score and Plan: Propofol infusion and TIVA  Airway Management Planned: Natural Airway and Nasal Cannula  Additional Equipment:   Intra-op Plan:   Post-operative Plan:   Informed Consent:  I have reviewed the patients History and Physical, chart, labs and discussed the procedure including the risks, benefits and alternatives for the proposed anesthesia with the patient or authorized representative who has indicated his/her understanding and acceptance.   Dental Advisory Given  Plan Discussed with: Anesthesiologist, CRNA and Surgeon  Anesthesia Plan Comments: (Patient and family consented for risks of anesthesia including but not limited to:  - adverse reactions to medications - risk of intubation if required - damage to teeth, lips or other oral mucosa - sore throat or hoarseness - Damage to heart, brain, lungs or loss of life  They voiced understanding.)        Anesthesia Quick Evaluation

## 2017-10-08 NOTE — Progress Notes (Signed)
[place] at [facility]                                                                                                                                                                                  Patient Demographics   Dawn Cooper, is a 84 y.o. female, DOB - 1918/10/26, YKD:[id number]  Admit date - 10/08/2017   Admitting Physician Harrie Foreman, MD  Outpatient Primary MD for the patient is Leone Haven, MD   LOS - 0  Subjective: Called by nurse earlier was noted to have hypotension No further pain  Review of Systems:   CONSTITUTIONAL: No documented fever. No fatigue, weakness. No weight gain, no weight loss.  EYES: No blurry or double vision.  ENT: No tinnitus. No postnasal drip. No redness of the oropharynx.  RESPIRATORY: No cough, no wheeze, no hemoptysis. No dyspnea.  CARDIOVASCULAR: No chest pain. No orthopnea. No palpitations. No syncope.  GASTROINTESTINAL: No nausea, no vomiting or diarrhea. No abdominal pain. No melena or hematochezia.  GENITOURINARY: No dysuria or hematuria.  ENDOCRINE: No polyuria or nocturia. No heat or cold intolerance.  HEMATOLOGY: No anemia. No bruising. No bleeding.  INTEGUMENTARY: No rashes. No lesions.  MUSCULOSKELETAL: No arthritis. No swelling. No gout.  NEUROLOGIC: No numbness, tingling, or ataxia. No seizure-type activity.  PSYCHIATRIC: No anxiety. No insomnia. No ADD.    Vitals:   Vitals:   10/08/17 1203 10/08/17 1243 10/08/17 1253 10/08/17 1325  BP: 109/83   104/66  Pulse:    71  Resp:  16 16 16   Temp:    (!) 97.4 F (36.3 C)  TempSrc:    Oral  SpO2:    100%  Weight:      Height:        Wt Readings from Last 3 Encounters:  10/07/17 63.5 kg (140 lb)  09/15/17 63.5 kg (140 lb)  05/05/17 63.4 kg (139 lb 12 oz)     Intake/Output Summary (Last 24 hours) at 10/08/2017 1518 Last data filed at 10/08/2017 1148 Gross per 24 hour  Intake -  Output 1 ml  Net -1 ml    Physical Exam:    GENERAL: Pleasant-appearing in no apparent distress.  HEAD, EYES, EARS, NOSE AND THROAT: Atraumatic, normocephalic. Extraocular muscles are intact. Pupils equal and reactive to light. Sclerae anicteric. No conjunctival injection. No oro-pharyngeal erythema.  NECK: Supple. There is no jugular venous distention. No bruits, no lymphadenopathy, no thyromegaly.  HEART: Regular rate and rhythm,. No murmurs, no rubs, no clicks.  LUNGS: Clear to auscultation bilaterally. No rales or rhonchi. No wheezes.  ABDOMEN: Soft, flat, nontender, nondistended. Has good bowel sounds. No hepatosplenomegaly appreciated.  EXTREMITIES: No evidence of any cyanosis, clubbing, or peripheral edema.  +2 pedal and radial pulses bilaterally.  NEUROLOGIC: The patient is alert, awake, and oriented x3 with no focal motor or sensory deficits appreciated bilaterally.  SKIN: Moist and warm with no rashes appreciated.  Psych: Not anxious, depressed LN: No inguinal LN enlargement    Antibiotics   Anti-infectives (From admission, onward)   None      Medications   Scheduled Meds: . docusate sodium  100 mg Oral BID  . pantoprazole  40 mg Oral Daily  . [START ON 10/09/2017] potassium chloride  10 mEq Oral QODAY   Continuous Infusions: . sodium chloride    . sodium chloride 100 mL/hr at 10/08/17 1353   PRN Meds:.acetaminophen **OR** acetaminophen, fentaNYL (SUBLIMAZE) injection, nitroGLYCERIN, ondansetron **OR** ondansetron (ZOFRAN) IV, oxyCODONE **OR** oxyCODONE   Data Review:   Micro Results No results found for this or any previous visit (from the past 240 hour(s)).  Radiology Reports Ct Abdomen Pelvis Wo Contrast  Result Date: 10/08/2017 CLINICAL DATA:  84 year old female with abdominal pain and vomiting after ERCP. EXAM: CT ABDOMEN AND PELVIS WITHOUT CONTRAST TECHNIQUE: Multidetector CT imaging of the abdomen and pelvis was performed following the standard protocol without IV contrast. COMPARISON:  MRI dated  10/03/2017 FINDINGS: Evaluation of this exam is limited in the absence of intravenous contrast. Lower chest: Partially visualized small left pleural effusion. There are diffuse chronic interstitial coarsening. Coronary vascular calcification as well as calcification of the aortic valve. No intra-abdominal free air or free fluid. Hepatobiliary: The liver is unremarkable. No intrahepatic biliary ductal dilatation. Cholecystectomy. There is pneumobilia. No retained calcified stone noted in the CBD. Mild dilatation of the common bile duct measuring 11 mm, likely post cholecystectomy. Pancreas: Unremarkable. No pancreatic ductal dilatation or surrounding inflammatory changes. Spleen: Small calcified splenic granuloma. The spleen is otherwise unremarkable. Adrenals/Urinary Tract: The adrenal glands, kidneys, visualized ureters, and urinary bladder appear unremarkable. Stomach/Bowel: There is sigmoid diverticulosis without active inflammatory changes. A 2.5 cm distal duodenal diverticulum. There is a small hiatal hernia. There is no bowel obstruction or active inflammation. Appendectomy. Vascular/Lymphatic: Advanced aortoiliac atherosclerotic disease. The aorta is tortuous. There is a 2.5 cm infrarenal aortic ectasia. The IVC is grossly unremarkable. Evaluation of the vasculature is limited in the absence of intravenous contrast. No portal venous gas. There is no adenopathy. Reproductive: The uterus and ovaries are grossly unremarkable. No pelvic mass. Other: Small fat containing umbilical hernia. Musculoskeletal: Osteopenia with degenerative changes and disc desiccation. No acute osseous pathology. Right femoral intra measure rod and transcervical fixation screw. No acute osseous pathology. IMPRESSION: 1. No acute intra-abdominal or pelvic pathology. No pneumoperitoneum or evidence of bowel perforation. 2. Colonic diverticulosis. No bowel obstruction or active inflammation. 3.  Aortic Atherosclerosis (ICD10-I70.0). 4. A  2.5 cm infrarenal aortic ectasia. Ectatic abdominal aorta at risk for aneurysm development. Recommend followup by ultrasound in 5 years. This recommendation follows ACR consensus guidelines: White Paper of the ACR Incidental Findings Committee II on Vascular Findings. J Am Coll Radiol 2013; 10:789-794. Electronically Signed   By: Anner Crete M.D.   On: 10/08/2017 04:56   Mr 3d Recon At Scanner  Result Date: 10/03/2017 CLINICAL DATA:  84 year old female with history of elevated liver function tests. EXAM: MRI ABDOMEN WITHOUT AND WITH CONTRAST (INCLUDING MRCP) TECHNIQUE: Multiplanar multisequence MR imaging of the abdomen was performed both before and after the administration of intravenous contrast. Heavily T2-weighted images of the biliary and pancreatic ducts were obtained, and three-dimensional  MRCP images were rendered by post processing. CONTRAST:  63mL MULTIHANCE GADOBENATE DIMEGLUMINE 529 MG/ML IV SOLN COMPARISON:  No priors. Abdominal ultrasound 11/16/2017. CT the abdomen and pelvis 07/23/2016. FINDINGS: Lower chest: Trace left pleural effusion lying dependently. Mild cardiomegaly. Hepatobiliary: No suspicious cystic or solid hepatic lesions. Status post cholecystectomy. No intrahepatic biliary ductal dilatation noted on MRCP images. Common bile duct is mildly dilated proximally measuring up to 12 mm. Distally, common bile duct measures 6 mm. There 3 filling defects in the proximal common bile duct, compatible with choledocholithiasis. The largest of these measures up to 11 mm in diameter. Pancreas: No pancreatic mass. No pancreatic ductal dilatation. No pancreatic or peripancreatic fluid or inflammatory changes. The Spleen:  Unremarkable. Adrenals/Urinary Tract: In the interpolar region of the left kidney there is a 1.4 cm simple cyst. Right kidney and bilateral adrenal glands are normal in appearance. Small amount of perinephric fluid bilaterally (nonspecific). No hydroureteronephrosis in the  visualized portions of the abdomen. Stomach/Bowel: Visualized portions are unremarkable. Vascular/Lymphatic: Aortic atherosclerosis without definite aneurysm in the abdominal vasculature. No lymphadenopathy noted in the abdomen. Other: No significant volume of ascites noted in the visualized portions of the peritoneal cavity. Musculoskeletal: No aggressive appearing osseous lesions are noted in the visualized portions of the skeleton. IMPRESSION: 1. Choledocholithiasis. This is associated with mild common bile duct dilatation, but no intrahepatic biliary ductal dilatation. 2. Trace left pleural effusion. 3. Cardiomegaly. Electronically Signed   By: Vinnie Langton M.D.   On: 10/03/2017 16:04   Dg C-arm 1-60 Min-no Report  Result Date: 10/07/2017 Fluoroscopy was utilized by the requesting physician.  No radiographic interpretation.   Mr Abdomen Mrcp Moise Boring Contast  Result Date: 10/03/2017 CLINICAL DATA:  84 year old female with history of elevated liver function tests. EXAM: MRI ABDOMEN WITHOUT AND WITH CONTRAST (INCLUDING MRCP) TECHNIQUE: Multiplanar multisequence MR imaging of the abdomen was performed both before and after the administration of intravenous contrast. Heavily T2-weighted images of the biliary and pancreatic ducts were obtained, and three-dimensional MRCP images were rendered by post processing. CONTRAST:  32mL MULTIHANCE GADOBENATE DIMEGLUMINE 529 MG/ML IV SOLN COMPARISON:  No priors. Abdominal ultrasound 11/16/2017. CT the abdomen and pelvis 07/23/2016. FINDINGS: Lower chest: Trace left pleural effusion lying dependently. Mild cardiomegaly. Hepatobiliary: No suspicious cystic or solid hepatic lesions. Status post cholecystectomy. No intrahepatic biliary ductal dilatation noted on MRCP images. Common bile duct is mildly dilated proximally measuring up to 12 mm. Distally, common bile duct measures 6 mm. There 3 filling defects in the proximal common bile duct, compatible with  choledocholithiasis. The largest of these measures up to 11 mm in diameter. Pancreas: No pancreatic mass. No pancreatic ductal dilatation. No pancreatic or peripancreatic fluid or inflammatory changes. The Spleen:  Unremarkable. Adrenals/Urinary Tract: In the interpolar region of the left kidney there is a 1.4 cm simple cyst. Right kidney and bilateral adrenal glands are normal in appearance. Small amount of perinephric fluid bilaterally (nonspecific). No hydroureteronephrosis in the visualized portions of the abdomen. Stomach/Bowel: Visualized portions are unremarkable. Vascular/Lymphatic: Aortic atherosclerosis without definite aneurysm in the abdominal vasculature. No lymphadenopathy noted in the abdomen. Other: No significant volume of ascites noted in the visualized portions of the peritoneal cavity. Musculoskeletal: No aggressive appearing osseous lesions are noted in the visualized portions of the skeleton. IMPRESSION: 1. Choledocholithiasis. This is associated with mild common bile duct dilatation, but no intrahepatic biliary ductal dilatation. 2. Trace left pleural effusion. 3. Cardiomegaly. Electronically Signed   By: Mauri Brooklyn.D.  On: 10/03/2017 16:04   US Abdomen Limited Ruq  Result Date: 09/16/2017 CLINICAL DATA:  Abnormal LFTs, elevated alkaline phosphatase and bilirubin EXAM: ULTRASOUND ABDOMEN LIMITED RIGHT UPPER QUADRANT COMPARISON:  07/23/2016 FINDINGS: Gallbladder: Surgically absent Common bile duct: Diameter: 8 mm Liver: No focal lesion identified. Within normal limits in parenchymal echogenicity. Portal vein is patent on color Doppler imaging with normal direction of blood flow towards the liver. IMPRESSION: Remote cholecystectomy. Mild biliary prominence, suspect postoperative No acute finding by ultrasound. No upper abdominal free fluid. Electronically Signed   By: Jerilynn Mages.  Shick M.D.   On: 09/16/2017 14:58     CBC Recent Labs  Lab 10/07/17 2318 10/08/17 1036  WBC 8.2 7.4   HGB 12.1 10.1*  HCT 35.0 29.4*  PLT 206 190  MCV 93.9 94.9  MCH 32.4 32.6  MCHC 34.6 34.3  RDW 13.8 14.1    Chemistries  Recent Labs  Lab 10/04/17 1015 10/07/17 2318 10/08/17 1036  NA 140 139 139  K 4.0 4.9 4.9  CL 109 110 111  CO2 25 22 23   GLUCOSE 172* 135* 123*  BUN 17 31* 42*  CREATININE 1.28* 1.36* 1.32*  CALCIUM 9.1 9.0 8.7*  AST  --  34  --   ALT  --  22  --   ALKPHOS  --  143*  --   BILITOT  --  1.6*  --    ------------------------------------------------------------------------------------------------------------------ estimated creatinine clearance is 19.6 mL/min (A) (by C-G formula based on SCr of 1.32 mg/dL (H)). ------------------------------------------------------------------------------------------------------------------ No results for input(s): HGBA1C in the last 72 hours. ------------------------------------------------------------------------------------------------------------------ No results for input(s): CHOL, HDL, LDLCALC, TRIG, CHOLHDL, LDLDIRECT in the last 72 hours. ------------------------------------------------------------------------------------------------------------------ Recent Labs    10/08/17 1036  TSH 1.544   ------------------------------------------------------------------------------------------------------------------ No results for input(s): VITAMINB12, FOLATE, FERRITIN, TIBC, IRON, RETICCTPCT in the last 72 hours.  Coagulation profile No results for input(s): INR, PROTIME in the last 168 hours.  No results for input(s): DDIMER in the last 72 hours.  Cardiac Enzymes No results for input(s): CKMB, TROPONINI, MYOGLOBIN in the last 168 hours.  Invalid input(s): CK ------------------------------------------------------------------------------------------------------------------ Invalid input(s): [facility]   This is a 84 year old female admitted for hematemesis. 1.  Hematemesis: Seen by GI and had a  EGD with showed a duodenal ulcer but not actively bleeding cauterized, will follow H&H 2.    Hypotension started patient on IV fluids hold blood pressure medication 3.  Atrial fibrillation rate controlled 4.  CKD: Stage III; avoid nephrotoxic agents. 5.  DVT prophylaxis: SCDs 6.  GI prophylaxis: None The patient is a full code.  Time spent on admission orders and patient care approximately 45 minutes       Code Status Orders  (From admission, onward)        Start     Ordered   10/08/17 1518  Do not attempt resuscitation (DNR)  Continuous    Question Answer Comment  In the event of cardiac or respiratory ARREST Do not call a "code blue"   In the event of cardiac or respiratory ARREST Do not perform Intubation, CPR, defibrillation or ACLS   In the event of cardiac or respiratory ARREST Use medication by any route, position, wound care, and other measures to relive pain and suffering. May use oxygen, suction and manual treatment of airway obstruction as needed for comfort.      10/08/17 1517    Code Status History    Date Active Date Inactive Code Status Order  ID Comments User Context   10/08/2017 1249 10/08/2017 1517 Full Code [id number]  Harrie Foreman, MD Inpatient   08/10/2016 0057 08/14/2016 1700 Full Code [id number]  Burnell Blanks, MD Inpatient   08/07/2016 1356 08/07/2016 1840 Full Code [id number]  Sherren Mocha, MD Inpatient   09/09/2014 1925 09/10/2014 1853 DNR [id number]  Aldean Jewett, MD Inpatient   09/09/2014 1851 09/09/2014 1925 DNR [id number]  Aldean Jewett, MD ED           Consults  gi  DVT Prophylaxis   scd's  Lab Results  Component Value Date   PLT 190 10/08/2017     Time Spent in minutes  89min  Greater than 50% of time spent in care coordination and counseling patient regarding the condition and plan of care.   Dustin Flock M.D on 10/08/2017 at 3:18 PM  Between 7am to 6pm - Pager - [phone]  After 6pm go to [email or web address] -  Proofreader  [employer]   Office  [phone]

## 2017-10-08 NOTE — ED Notes (Signed)
Spoke to doctor Posey Pronto and he gave VO for NACL at 165ml/hr

## 2017-10-08 NOTE — ED Notes (Addendum)
Endo nurse Magda Paganini called and stated that pt would be going to Endo for procedure at 1130am - per nurse dr Allen Norris will be doing procedure - they requested labs be draw STAT for procedure - labs drawn at this time and pt daughter updated on plan

## 2017-10-09 ENCOUNTER — Encounter: Payer: Self-pay | Admitting: Gastroenterology

## 2017-10-09 ENCOUNTER — Telehealth: Payer: Self-pay

## 2017-10-09 LAB — CBC
HEMATOCRIT: 22.9 % — AB (ref 35.0–47.0)
HEMOGLOBIN: 8 g/dL — AB (ref 12.0–16.0)
MCH: 33.3 pg (ref 26.0–34.0)
MCHC: 34.9 g/dL (ref 32.0–36.0)
MCV: 95.3 fL (ref 80.0–100.0)
Platelets: 146 10*3/uL — ABNORMAL LOW (ref 150–440)
RBC: 2.4 MIL/uL — AB (ref 3.80–5.20)
RDW: 13.9 % (ref 11.5–14.5)
WBC: 5.6 10*3/uL (ref 3.6–11.0)

## 2017-10-09 LAB — BASIC METABOLIC PANEL
ANION GAP: 4 — AB (ref 5–15)
BUN: 32 mg/dL — ABNORMAL HIGH (ref 8–23)
CHLORIDE: 116 mmol/L — AB (ref 98–111)
CO2: 22 mmol/L (ref 22–32)
Calcium: 8 mg/dL — ABNORMAL LOW (ref 8.9–10.3)
Creatinine, Ser: 1.29 mg/dL — ABNORMAL HIGH (ref 0.44–1.00)
GFR calc non Af Amer: 34 mL/min — ABNORMAL LOW (ref >60)
GFR, EST AFRICAN AMERICAN: 39 mL/min — AB (ref >60)
Glucose, Bld: 96 mg/dL (ref 70–99)
POTASSIUM: 4.1 mmol/L (ref 3.5–5.1)
SODIUM: 142 mmol/L (ref 135–145)

## 2017-10-09 LAB — HEMOGLOBIN AND HEMATOCRIT, BLOOD
HEMATOCRIT: 24.2 % — AB (ref 35.0–47.0)
HEMOGLOBIN: 8.4 g/dL — AB (ref 12.0–16.0)

## 2017-10-09 NOTE — Telephone Encounter (Signed)
Copied from [place] [id number]. Topic: Appointment Scheduling - Scheduling Inquiry for Clinic >> Oct 09, 2017 11:42 AM Hewitt Shorts wrote: Pt is needing a hospital follow up appt and the facility is request that she ne seen on 10/13/17 and nothing is available please call pt to worik in   Best number [phone]

## 2017-10-09 NOTE — Progress Notes (Signed)
Dawn Antigua, MD [address] [place], [place], [place], [address] [address], [place], [place], [place], [address] Phone: [phone]  Fax: [phone]   Subjective: No further episodes of GI bleeding.  Tolerating oral diet without difficulty.  No abdominal pain.  No melena.  Objective: Exam: Vital signs in last 24 hours: Vitals:   10/08/17 1253 10/08/17 1325 10/08/17 2126 10/09/17 0629  BP:  104/66 (!) 92/49 99/60  Pulse:  71 78 80  Resp: 16 16 20 20   Temp:  (!) 97.4 F (36.3 C) 98.2 F (36.8 C) 97.6 F (36.4 C)  TempSrc:  Oral Oral Oral  SpO2:  100% 99% 100%  Weight:      Height:       Weight change:   Intake/Output Summary (Last 24 hours) at 10/09/2017 1145 Last data filed at 10/09/2017 1027 Gross per 24 hour  Intake 3565 ml  Output 701 ml  Net 2864 ml    General: No acute distress, AAO x3 Abd: Soft, NT/ND, No HSM Skin: Warm, no rashes Neck: Supple, Trachea midline   Lab Results: Lab Results  Component Value Date   WBC 5.6 10/09/2017   HGB 8.4 (L) 10/09/2017   HCT 24.2 (L) 10/09/2017   MCV 95.3 10/09/2017   PLT 146 (L) 10/09/2017   Micro Results: No results found for this or any previous visit (from the past 240 hour(s)). Studies/Results: Ct Abdomen Pelvis Wo Contrast  Result Date: 10/08/2017 CLINICAL DATA:  84 year old female with abdominal pain and vomiting after ERCP. EXAM: CT ABDOMEN AND PELVIS WITHOUT CONTRAST TECHNIQUE: Multidetector CT imaging of the abdomen and pelvis was performed following the standard protocol without IV contrast. COMPARISON:  MRI dated 10/03/2017 FINDINGS: Evaluation of this exam is limited in the absence of intravenous contrast. Lower chest: Partially visualized small left pleural effusion. There are diffuse chronic interstitial coarsening. Coronary vascular calcification as well as calcification of the aortic valve. No intra-abdominal free air or free fluid. Hepatobiliary: The liver is unremarkable. No  intrahepatic biliary ductal dilatation. Cholecystectomy. There is pneumobilia. No retained calcified stone noted in the CBD. Mild dilatation of the common bile duct measuring 11 mm, likely post cholecystectomy. Pancreas: Unremarkable. No pancreatic ductal dilatation or surrounding inflammatory changes. Spleen: Small calcified splenic granuloma. The spleen is otherwise unremarkable. Adrenals/Urinary Tract: The adrenal glands, kidneys, visualized ureters, and urinary bladder appear unremarkable. Stomach/Bowel: There is sigmoid diverticulosis without active inflammatory changes. A 2.5 cm distal duodenal diverticulum. There is a small hiatal hernia. There is no bowel obstruction or active inflammation. Appendectomy. Vascular/Lymphatic: Advanced aortoiliac atherosclerotic disease. The aorta is tortuous. There is a 2.5 cm infrarenal aortic ectasia. The IVC is grossly unremarkable. Evaluation of the vasculature is limited in the absence of intravenous contrast. No portal venous gas. There is no adenopathy. Reproductive: The uterus and ovaries are grossly unremarkable. No pelvic mass. Other: Small fat containing umbilical hernia. Musculoskeletal: Osteopenia with degenerative changes and disc desiccation. No acute osseous pathology. Right femoral intra measure rod and transcervical fixation screw. No acute osseous pathology. IMPRESSION: 1. No acute intra-abdominal or pelvic pathology. No pneumoperitoneum or evidence of bowel perforation. 2. Colonic diverticulosis. No bowel obstruction or active inflammation. 3.  Aortic Atherosclerosis (ICD10-I70.0). 4. A 2.5 cm infrarenal aortic ectasia. Ectatic abdominal aorta at risk for aneurysm development. Recommend followup by ultrasound in 5 years. This recommendation follows ACR consensus guidelines: White Paper of the ACR Incidental Findings Committee II on Vascular Findings. J Am Coll Radiol 2013; 10:789-794. Electronically Signed  By: Anner Crete M.D.   On: 10/08/2017 04:56     Dg C-arm 1-60 Min-no Report  Result Date: 10/07/2017 Fluoroscopy was utilized by the requesting physician.  No radiographic interpretation.   Medications:  Scheduled Meds: . docusate sodium  100 mg Oral BID  . pantoprazole  40 mg Oral Daily   Continuous Infusions: . sodium chloride 100 mL/hr at 10/08/17 2106   PRN Meds:.acetaminophen **OR** acetaminophen, fentaNYL (SUBLIMAZE) injection, nitroGLYCERIN, ondansetron **OR** ondansetron (ZOFRAN) IV, oxyCODONE **OR** oxyCODONE   Assessment: Active Problems:   Hematemesis   Gastrointestinal hemorrhage with hematemesis   Duodenal ulceration    Plan: Continue PPI twice daily Hemoglobin stable without any further signs of GI bleeding At the time of discharge, patient should be be discharged on full dose PPI twice daily for 4 weeks Follow-up in GI clinic  Follow-up with PMD closely  Avoid NSAIDs  Please page GI with any signs of active GI bleeding   LOS: 1 day   Dawn Antigua, MD 10/09/2017, 11:45 AM

## 2017-10-09 NOTE — Telephone Encounter (Signed)
Please see the message prior to yours.

## 2017-10-09 NOTE — Progress Notes (Signed)
Pt discharged per MD order. IV removed. Discharge instructions reviewed with pt and her daughter. Pt and daughter verbalized understanding and all questions answered to satisfaction. Pt taken to car in wheelchair by volunteer.

## 2017-10-09 NOTE — Telephone Encounter (Signed)
fyi

## 2017-10-09 NOTE — Telephone Encounter (Signed)
It is okay to schedule at 4:30 on 7 / 22 / 19.

## 2017-10-09 NOTE — Care Management (Signed)
Patient was admitted from home with Hematemesis.  Patient has been cleared for discharge.  Patient lives at home with daughter.  PCP Sonnenberg. Patient has RW, and cane in the home.  Staff has ambulated patient, and she was at her baseline.  Per MD not home health needs.  RNCM signing off

## 2017-10-09 NOTE — Telephone Encounter (Signed)
Dawn Cooper is going to work on [employer] for this patient, where can we schedule her for hospital follow up?

## 2017-10-09 NOTE — Discharge Summary (Signed)
[place] [facility], 84 y.o., DOB [birth year], MRN [id number]. Admission date: 10/08/2017 Discharge Date 10/09/2017 Primary MD Leone Haven, MD Admitting Physician Harrie Foreman, MD  Admission Diagnosis  Gastrointestinal hemorrhage with hematemesis [K92.0]  Discharge Diagnosis   Active Problems:   Upper GI gastrointestinal hemorrhage with hematemesis   Duodenal ulceration Hypotension due to GI bleed Atrial fibrillation Chronic kidney disease stage III      Hospital Course Patient 84 year old female who underwent ERCP day prior to admission presented with throwing up blood.  Patient was taken to the EGD and was noted to have duodenal ulceration that area was cauterized.  Patient had no further bleeding.  Hemoglobin did drop I recommended CBC checked this coming Monday.  Patient currently has no further symptoms and is doing well.            Consults  GI  Significant Tests:  See full reports for all details    Ct Abdomen Pelvis Wo Contrast  Result Date: 10/08/2017 CLINICAL DATA:  84 year old female with abdominal pain and vomiting after ERCP. EXAM: CT ABDOMEN AND PELVIS WITHOUT CONTRAST TECHNIQUE: Multidetector CT imaging of the abdomen and pelvis was performed following the standard protocol without IV contrast. COMPARISON:  MRI dated 10/03/2017 FINDINGS: Evaluation of this exam is limited in the absence of intravenous contrast. Lower chest: Partially visualized small left pleural effusion. There are diffuse chronic interstitial coarsening. Coronary vascular calcification as well as calcification of the aortic valve. No intra-abdominal free air or free fluid. Hepatobiliary: The liver is unremarkable. No intrahepatic biliary ductal dilatation. Cholecystectomy. There is pneumobilia. No retained calcified stone noted in the CBD. Mild dilatation of the common bile duct measuring 11 mm, likely post cholecystectomy. Pancreas:  Unremarkable. No pancreatic ductal dilatation or surrounding inflammatory changes. Spleen: Small calcified splenic granuloma. The spleen is otherwise unremarkable. Adrenals/Urinary Tract: The adrenal glands, kidneys, visualized ureters, and urinary bladder appear unremarkable. Stomach/Bowel: There is sigmoid diverticulosis without active inflammatory changes. A 2.5 cm distal duodenal diverticulum. There is a small hiatal hernia. There is no bowel obstruction or active inflammation. Appendectomy. Vascular/Lymphatic: Advanced aortoiliac atherosclerotic disease. The aorta is tortuous. There is a 2.5 cm infrarenal aortic ectasia. The IVC is grossly unremarkable. Evaluation of the vasculature is limited in the absence of intravenous contrast. No portal venous gas. There is no adenopathy. Reproductive: The uterus and ovaries are grossly unremarkable. No pelvic mass. Other: Small fat containing umbilical hernia. Musculoskeletal: Osteopenia with degenerative changes and disc desiccation. No acute osseous pathology. Right femoral intra measure rod and transcervical fixation screw. No acute osseous pathology. IMPRESSION: 1. No acute intra-abdominal or pelvic pathology. No pneumoperitoneum or evidence of bowel perforation. 2. Colonic diverticulosis. No bowel obstruction or active inflammation. 3.  Aortic Atherosclerosis (ICD10-I70.0). 4. A 2.5 cm infrarenal aortic ectasia. Ectatic abdominal aorta at risk for aneurysm development. Recommend followup by ultrasound in 5 years. This recommendation follows ACR consensus guidelines: White Paper of the ACR Incidental Findings Committee II on Vascular Findings. J Am Coll Radiol 2013; 10:789-794. Electronically Signed   By: Anner Crete M.D.   On: 10/08/2017 04:56   Mr 3d Recon At Scanner  Result Date: 10/03/2017 CLINICAL DATA:  84 year old female with history of elevated liver function tests. EXAM: MRI ABDOMEN WITHOUT AND WITH CONTRAST (INCLUDING MRCP) TECHNIQUE: Multiplanar  multisequence MR imaging of the abdomen was performed both before and after the administration of intravenous contrast. Heavily T2-weighted images of the biliary and pancreatic ducts were  obtained, and three-dimensional MRCP images were rendered by post processing. CONTRAST:  63mL MULTIHANCE GADOBENATE DIMEGLUMINE 529 MG/ML IV SOLN COMPARISON:  No priors. Abdominal ultrasound 11/16/2017. CT the abdomen and pelvis 07/23/2016. FINDINGS: Lower chest: Trace left pleural effusion lying dependently. Mild cardiomegaly. Hepatobiliary: No suspicious cystic or solid hepatic lesions. Status post cholecystectomy. No intrahepatic biliary ductal dilatation noted on MRCP images. Common bile duct is mildly dilated proximally measuring up to 12 mm. Distally, common bile duct measures 6 mm. There 3 filling defects in the proximal common bile duct, compatible with choledocholithiasis. The largest of these measures up to 11 mm in diameter. Pancreas: No pancreatic mass. No pancreatic ductal dilatation. No pancreatic or peripancreatic fluid or inflammatory changes. The Spleen:  Unremarkable. Adrenals/Urinary Tract: In the interpolar region of the left kidney there is a 1.4 cm simple cyst. Right kidney and bilateral adrenal glands are normal in appearance. Small amount of perinephric fluid bilaterally (nonspecific). No hydroureteronephrosis in the visualized portions of the abdomen. Stomach/Bowel: Visualized portions are unremarkable. Vascular/Lymphatic: Aortic atherosclerosis without definite aneurysm in the abdominal vasculature. No lymphadenopathy noted in the abdomen. Other: No significant volume of ascites noted in the visualized portions of the peritoneal cavity. Musculoskeletal: No aggressive appearing osseous lesions are noted in the visualized portions of the skeleton. IMPRESSION: 1. Choledocholithiasis. This is associated with mild common bile duct dilatation, but no intrahepatic biliary ductal dilatation. 2. Trace left pleural  effusion. 3. Cardiomegaly. Electronically Signed   By: Vinnie Langton M.D.   On: 10/03/2017 16:04   Dg C-arm 1-60 Min-no Report  Result Date: 10/07/2017 Fluoroscopy was utilized by the requesting physician.  No radiographic interpretation.   Mr Abdomen Mrcp Moise Boring Contast  Result Date: 10/03/2017 CLINICAL DATA:  84 year old female with history of elevated liver function tests. EXAM: MRI ABDOMEN WITHOUT AND WITH CONTRAST (INCLUDING MRCP) TECHNIQUE: Multiplanar multisequence MR imaging of the abdomen was performed both before and after the administration of intravenous contrast. Heavily T2-weighted images of the biliary and pancreatic ducts were obtained, and three-dimensional MRCP images were rendered by post processing. CONTRAST:  8mL MULTIHANCE GADOBENATE DIMEGLUMINE 529 MG/ML IV SOLN COMPARISON:  No priors. Abdominal ultrasound 11/16/2017. CT the abdomen and pelvis 07/23/2016. FINDINGS: Lower chest: Trace left pleural effusion lying dependently. Mild cardiomegaly. Hepatobiliary: No suspicious cystic or solid hepatic lesions. Status post cholecystectomy. No intrahepatic biliary ductal dilatation noted on MRCP images. Common bile duct is mildly dilated proximally measuring up to 12 mm. Distally, common bile duct measures 6 mm. There 3 filling defects in the proximal common bile duct, compatible with choledocholithiasis. The largest of these measures up to 11 mm in diameter. Pancreas: No pancreatic mass. No pancreatic ductal dilatation. No pancreatic or peripancreatic fluid or inflammatory changes. The Spleen:  Unremarkable. Adrenals/Urinary Tract: In the interpolar region of the left kidney there is a 1.4 cm simple cyst. Right kidney and bilateral adrenal glands are normal in appearance. Small amount of perinephric fluid bilaterally (nonspecific). No hydroureteronephrosis in the visualized portions of the abdomen. Stomach/Bowel: Visualized portions are unremarkable. Vascular/Lymphatic: Aortic atherosclerosis  without definite aneurysm in the abdominal vasculature. No lymphadenopathy noted in the abdomen. Other: No significant volume of ascites noted in the visualized portions of the peritoneal cavity. Musculoskeletal: No aggressive appearing osseous lesions are noted in the visualized portions of the skeleton. IMPRESSION: 1. Choledocholithiasis. This is associated with mild common bile duct dilatation, but no intrahepatic biliary ductal dilatation. 2. Trace left pleural effusion. 3. Cardiomegaly. Electronically Signed   By: Vinnie Langton  M.D.   On: 10/03/2017 16:04   US Abdomen Limited Ruq  Result Date: 09/16/2017 CLINICAL DATA:  Abnormal LFTs, elevated alkaline phosphatase and bilirubin EXAM: ULTRASOUND ABDOMEN LIMITED RIGHT UPPER QUADRANT COMPARISON:  07/23/2016 FINDINGS: Gallbladder: Surgically absent Common bile duct: Diameter: 8 mm Liver: No focal lesion identified. Within normal limits in parenchymal echogenicity. Portal vein is patent on color Doppler imaging with normal direction of blood flow towards the liver. IMPRESSION: Remote cholecystectomy. Mild biliary prominence, suspect postoperative No acute finding by ultrasound. No upper abdominal free fluid. Electronically Signed   By: Jerilynn Mages.  Shick M.D.   On: 09/16/2017 14:58       Today   Subjective:   Dawn Cooper patient with no further bleeding doing better Objective:   Blood pressure 99/60, pulse 80, temperature 97.6 F (36.4 C), temperature source Oral, resp. rate 20, height 4\' 10"  (1.473 m), weight 63.5 kg (140 lb), SpO2 100 %.  .  Intake/Output Summary (Last 24 hours) at 10/09/2017 1427 Last data filed at 10/09/2017 1423 Gross per 24 hour  Intake 2805 ml  Output 700 ml  Net 2105 ml    Exam VITAL SIGNS: Blood pressure 99/60, pulse 80, temperature 97.6 F (36.4 C), temperature source Oral, resp. rate 20, height 4\' 10"  (1.473 m), weight 63.5 kg (140 lb), SpO2 100 %.  GENERAL:  84 y.o.-year-old patient lying in the bed with no  acute distress.  EYES: Pupils equal, round, reactive to light and accommodation. No scleral icterus. Extraocular muscles intact.  HEENT: Head atraumatic, normocephalic. Oropharynx and nasopharynx clear.  NECK:  Supple, no jugular venous distention. No thyroid enlargement, no tenderness.  LUNGS: Normal breath sounds bilaterally, no wheezing, rales,rhonchi or crepitation. No use of accessory muscles of respiration.  CARDIOVASCULAR: S1, S2 normal. No murmurs, rubs, or gallops.  ABDOMEN: Soft, nontender, nondistended. Bowel sounds present. No organomegaly or mass.  EXTREMITIES: No pedal edema, cyanosis, or clubbing.  NEUROLOGIC: Cranial nerves II through XII are intact. Muscle strength 5/5 in all extremities. Sensation intact. Gait not checked.  PSYCHIATRIC: The patient is alert and oriented x 3.  SKIN: No obvious rash, lesion, or ulcer.   Data Review     CBC w Diff:  Lab Results  Component Value Date   WBC 5.6 10/09/2017   HGB 8.4 (L) 10/09/2017   HGB 11.2 (L) 01/28/2012   HCT 24.2 (L) 10/09/2017   HCT 33.1 (L) 01/28/2012   PLT 146 (L) 10/09/2017   PLT 407 01/28/2012   LYMPHOPCT 47 08/09/2016   LYMPHOPCT 28.2 01/28/2012   MONOPCT 6 08/09/2016   MONOPCT 8.3 01/28/2012   EOSPCT 4 08/09/2016   EOSPCT 5.1 01/28/2012   BASOPCT 0 08/09/2016   BASOPCT 0.8 01/28/2012   CMP:  Lab Results  Component Value Date   NA 142 10/09/2017   NA 141 07/26/2016   NA 140 01/15/2012   K 4.1 10/09/2017   K 4.2 01/15/2012   CL 116 (H) 10/09/2017   CL 106 01/15/2012   CO2 22 10/09/2017   CO2 27 01/15/2012   BUN 32 (H) 10/09/2017   BUN 23 07/26/2016   BUN 19 (H) 01/15/2012   CREATININE 1.29 (H) 10/09/2017   CREATININE 1.14 01/15/2012   PROT 6.3 (L) 10/07/2017   ALBUMIN 2.9 (L) 10/07/2017   BILITOT 1.6 (H) 10/07/2017   ALKPHOS 143 (H) 10/07/2017   AST 34 10/07/2017   ALT 22 10/07/2017  .  Micro Results No results found for this or any previous visit (from the past 240  hour(s)).       Code Status Orders  (From admission, onward)        Start     Ordered   10/08/17 1518  Do not attempt resuscitation (DNR)  Continuous    Question Answer Comment  In the event of cardiac or respiratory ARREST Do not call a "code blue"   In the event of cardiac or respiratory ARREST Do not perform Intubation, CPR, defibrillation or ACLS   In the event of cardiac or respiratory ARREST Use medication by any route, position, wound care, and other measures to relive pain and suffering. May use oxygen, suction and manual treatment of airway obstruction as needed for comfort.      10/08/17 1517    Code Status History    Date Active Date Inactive Code Status Order ID Comments User Context   10/08/2017 1249 10/08/2017 1517 Full Code [id number]  Harrie Foreman, MD Inpatient   08/10/2016 0057 08/14/2016 1700 Full Code [id number]  Burnell Blanks, MD Inpatient   08/07/2016 1356 08/07/2016 1840 Full Code [id number]  Sherren Mocha, MD Inpatient   09/09/2014 1925 09/10/2014 1853 DNR [id number]  Aldean Jewett, MD Inpatient   09/09/2014 1851 09/09/2014 1925 DNR [id number]  Aldean Jewett, MD ED          Follow-up Information    Leone Haven, MD On 10/13/2017.   Specialty:  Family Medicine Why:  Dr. office will call to schedule appointment         check cbc, and hospital f/u Contact information: [address] [place] [address] [phone]        Lucilla Lame, MD. Go on 10/20/2017.   Specialty:  Gastroenterology Why:   Monday July 29th at 3:30pm for a follow-up appoinment                   recent stent with bleed f/u Contact information: [place]  [place] [address] [phone]           Discharge Medications   Allergies as of 10/09/2017      Reactions   Dabigatran Other (See Comments)   Internal Bleeding Other reaction(s): Other (See Comments) Internal Bleeding   Dabigatran Etexilate Mesylate Other (See Comments)   Reaction:  Bleeding   Other reaction(s): Other (See Comments) Reaction:Bleeding    Clonidine Itching, Rash   Clonidine Derivatives Itching, Rash   Penicillins Itching, Rash, Other (See Comments)   Other reaction(s): Other (See Comments)      Medication List    STOP taking these medications   aspirin EC 81 MG tablet   clopidogrel 75 MG tablet Commonly known as:  PLAVIX     TAKE these medications   furosemide 20 MG tablet Commonly known as:  LASIX TAKE 1 TABLET EVERY OTHER DAY.   metoprolol tartrate 25 MG tablet Commonly known as:  LOPRESSOR TAKE 1/2 TABLET TWICE DAILY   nitroGLYCERIN 0.4 MG SL tablet Commonly known as:  NITROSTAT Place 1 tablet (0.4 mg total) under the tongue every 5 (five) minutes x 3 doses as needed for chest pain.   pantoprazole 40 MG tablet Commonly known as:  PROTONIX TAKE 1 TABLET EVERY DAY   potassium chloride 10 MEQ tablet Commonly known as:  K-DUR,KLOR-CON TAKE 1 TABLET EVERY OTHER DAY          Total Time in preparing paper work, data evaluation and todays exam - 22 minutes  Dustin Flock M.D on 10/09/2017 at 2:27 PM  [employer]  [phone]

## 2017-10-10 NOTE — Telephone Encounter (Signed)
Transition Care Management Follow-up Telephone Call  How have you been since you were released from the hospital? Feeling better just weak, still not eating a lot.   Do you understand why you were in the hospital? yes   Do you understand the discharge instrcutions? yes  Items Reviewed:  Medications reviewed: yes  Allergies reviewed: yes  Dietary changes reviewed: yes  Referrals reviewed: yes   Functional Questionnaire:   Activities of Daily Living (ADLs):   She states they are independent in the following: ambulation, bathing and hygiene, feeding, continence, grooming, toileting and dressing States they require assistance with the following: No assistance needed.   Any transportation issues/concerns?: no   Any patient concerns? no   Confirmed importance and date/time of follow-up visits scheduled: yes   Confirmed with patient if condition begins to worsen call PCP or go to the ER.  Patient was given the Call-a-Nurse line [phone]: yes

## 2017-10-11 ENCOUNTER — Other Ambulatory Visit: Payer: Self-pay | Admitting: Family Medicine

## 2017-10-11 DIAGNOSIS — J90 Pleural effusion, not elsewhere classified: Secondary | ICD-10-CM

## 2017-10-13 ENCOUNTER — Other Ambulatory Visit: Payer: Self-pay | Admitting: Cardiovascular Disease

## 2017-10-13 ENCOUNTER — Encounter: Payer: Self-pay | Admitting: Family Medicine

## 2017-10-13 ENCOUNTER — Ambulatory Visit (INDEPENDENT_AMBULATORY_CARE_PROVIDER_SITE_OTHER): Payer: Medicare Other | Admitting: Family Medicine

## 2017-10-13 VITALS — BP 124/64 | HR 76 | Temp 97.6°F | Wt 141.8 lb

## 2017-10-13 DIAGNOSIS — N183 Chronic kidney disease, stage 3 unspecified: Secondary | ICD-10-CM

## 2017-10-13 DIAGNOSIS — R609 Edema, unspecified: Secondary | ICD-10-CM

## 2017-10-13 DIAGNOSIS — K920 Hematemesis: Secondary | ICD-10-CM

## 2017-10-13 DIAGNOSIS — J90 Pleural effusion, not elsewhere classified: Secondary | ICD-10-CM | POA: Diagnosis not present

## 2017-10-13 LAB — CBC
HCT: 25.7 % — ABNORMAL LOW (ref 36.0–46.0)
Hemoglobin: 8.8 g/dL — ABNORMAL LOW (ref 12.0–15.0)
MCHC: 34.5 g/dL (ref 30.0–36.0)
MCV: 96.1 fl (ref 78.0–100.0)
Platelets: 192 10*3/uL (ref 150.0–400.0)
RBC: 2.67 Mil/uL — ABNORMAL LOW (ref 3.87–5.11)
RDW: 14.2 % (ref 11.5–15.5)
WBC: 7.1 10*3/uL (ref 4.0–10.5)

## 2017-10-13 LAB — BASIC METABOLIC PANEL
BUN: 13 mg/dL (ref 6–23)
CO2: 23 mEq/L (ref 19–32)
Calcium: 8.9 mg/dL (ref 8.4–10.5)
Chloride: 109 mEq/L (ref 96–112)
Creatinine, Ser: 1.39 mg/dL — ABNORMAL HIGH (ref 0.40–1.20)
GFR: 37.29 mL/min — ABNORMAL LOW (ref >60.00)
Glucose, Bld: 105 mg/dL — ABNORMAL HIGH (ref 70–99)
Potassium: 3.6 mEq/L (ref 3.5–5.1)
Sodium: 139 mEq/L (ref 135–145)

## 2017-10-13 NOTE — Assessment & Plan Note (Addendum)
Likely related to duodenal ulcer after ERCP.  Vital signs are stable.  We will recheck CBC.  She will follow-up with GI as planned.  Given return precautions.  I will send this note to the patient's cardiologist to get their input on whether or not she needs to resume aspirin and Plavix given that she had a GI bleed that dropped her hemoglobin from 12.1 to 8.0.

## 2017-10-13 NOTE — Assessment & Plan Note (Signed)
This seems to be asymptomatic.  Potentially could be related to her heart failure and having been off of Lasix.  We will check renal function and then likely resume her Lasix and recheck a chest x-ray in 2 to 3 days.  She has already been referred to pulmonology.  If the pleural effusion resolves with Lasix she will likely not need to see pulmonology.

## 2017-10-13 NOTE — Patient Instructions (Signed)
Nice to see you. We will check lab work today and contact you with the results. I will send a message to your cardiologist to get their input regarding your aspirin and Plavix. We will see what your lab results show and then likely have you restart the Lasix.  We will then have you return in 2 to 3 days for chest x-ray.

## 2017-10-13 NOTE — Progress Notes (Signed)
Tommi Rumps, MD Phone: [phone]  Dawn Cooper is a 84 y.o. female who presents today for f/u.  CC: Follow-up for GI bleed  Patient was hospitalized from 10/08/2017-10/09/2017 for GI bleed following ERCP.  She underwent EGD and was noted to have a duodenal ulcer that was cauterized.  She had no further bleeding.  Hemoglobin did drop though family reports she did not receive any blood.  They held her aspirin and Plavix.  Overall she has been doing well.  No hematemesis.  She reports black stools in the ED though none since getting home and they are normal now.  She feels a little lightheaded.  Possibly mildly short of breath.  Those 2 things only occur in the morning when she gets out of bed and she has no symptoms the rest of the day.  No abdominal pain.  She sees GI next Monday.  Discharge summary reviewed.  Medications reviewed.  Patient additionally has been noted to have left-sided pleural effusion.  She does have heart failure.  She has been off of her Lasix.  She notes no discomfort in her chest.  Mild dyspnea only in the morning right after she gets out of bed and otherwise breathes well.  Social History   Tobacco Use  Smoking Status Never Smoker  Smokeless Tobacco Never Used     ROS see history of present illness  Objective  Physical Exam Vitals:   10/13/17 1125  BP: 124/64  Pulse: 76  Temp: 97.6 F (36.4 C)  SpO2: 96%    BP Readings from Last 3 Encounters:  10/13/17 124/64  10/09/17 99/60  10/07/17 (!) 94/56   Wt Readings from Last 3 Encounters:  10/13/17 141 lb 12.8 oz (64.3 kg)  10/07/17 140 lb (63.5 kg)  09/15/17 140 lb (63.5 kg)    Physical Exam  Constitutional: No distress.  Cardiovascular: Normal rate and normal heart sounds. An irregularly irregular rhythm present.  Pulmonary/Chest: Effort normal and breath sounds normal.  Abdominal: Soft. Bowel sounds are normal. She exhibits no distension. There is no tenderness.  Musculoskeletal: She  exhibits no edema.  Neurological: She is alert.  Skin: Skin is warm and dry. She is not diaphoretic.     Assessment/Plan: Please see individual problem list.  Gastrointestinal hemorrhage with hematemesis Likely related to duodenal ulcer after ERCP.  Vital signs are stable.  We will recheck CBC.  She will follow-up with GI as planned.  Given return precautions.  I will send this note to the patient's cardiologist to get their input on whether or not she needs to resume aspirin and Plavix given that she had a GI bleed that dropped her hemoglobin from 12.1 to 8.0.  Pleural effusion on left This seems to be asymptomatic.  Potentially could be related to her heart failure and having been off of Lasix.  We will check renal function and then likely resume her Lasix and recheck a chest x-ray in 2 to 3 days.  She has already been referred to pulmonology.  If the pleural effusion resolves with Lasix she will likely not need to see pulmonology.   Orders Placed This Encounter  Procedures  . DG Chest 2 View    Standing Status:   Future    Standing Expiration Date:   12/15/2018    Order Specific Question:   Reason for Exam (SYMPTOM  OR DIAGNOSIS REQUIRED)    Answer:   left pleural effusion, history of heart failure    Order Specific Question:   Preferred  imaging location?    Answer:   [facility] Specific Question:   Radiology Contrast Protocol - do NOT remove file path    Answer:   [email or web address]  . CBC  . Basic Metabolic Panel (BMET)  . Basic Metabolic Panel (BMET)    Standing Status:   Future    Standing Expiration Date:   10/14/2018  . CBC    Standing Status:   Future    Standing Expiration Date:   10/14/2018    No orders of the defined types were placed in this encounter.    Tommi Rumps, MD [place]

## 2017-10-17 ENCOUNTER — Ambulatory Visit (INDEPENDENT_AMBULATORY_CARE_PROVIDER_SITE_OTHER): Payer: Medicare Other

## 2017-10-17 ENCOUNTER — Other Ambulatory Visit (INDEPENDENT_AMBULATORY_CARE_PROVIDER_SITE_OTHER): Payer: Medicare Other

## 2017-10-17 DIAGNOSIS — N183 Chronic kidney disease, stage 3 unspecified: Secondary | ICD-10-CM

## 2017-10-17 DIAGNOSIS — R918 Other nonspecific abnormal finding of lung field: Secondary | ICD-10-CM | POA: Diagnosis not present

## 2017-10-17 DIAGNOSIS — J90 Pleural effusion, not elsewhere classified: Secondary | ICD-10-CM

## 2017-10-17 DIAGNOSIS — K920 Hematemesis: Secondary | ICD-10-CM | POA: Diagnosis not present

## 2017-10-17 LAB — BASIC METABOLIC PANEL
BUN: 15 mg/dL (ref 6–23)
CALCIUM: 8.9 mg/dL (ref 8.4–10.5)
CO2: 23 mEq/L (ref 19–32)
Chloride: 107 mEq/L (ref 96–112)
Creatinine, Ser: 1.45 mg/dL — ABNORMAL HIGH (ref 0.40–1.20)
GFR: 35.51 mL/min — ABNORMAL LOW (ref >60.00)
Glucose, Bld: 156 mg/dL — ABNORMAL HIGH (ref 70–99)
POTASSIUM: 3.9 meq/L (ref 3.5–5.1)
SODIUM: 139 meq/L (ref 135–145)

## 2017-10-17 LAB — CBC
HEMATOCRIT: 27.4 % — AB (ref 36.0–46.0)
Hemoglobin: 9.3 g/dL — ABNORMAL LOW (ref 12.0–15.0)
MCHC: 33.9 g/dL (ref 30.0–36.0)
MCV: 96.2 fl (ref 78.0–100.0)
Platelets: 216 10*3/uL (ref 150.0–400.0)
RBC: 2.85 Mil/uL — ABNORMAL LOW (ref 3.87–5.11)
RDW: 14.4 % (ref 11.5–15.5)
WBC: 7.3 10*3/uL (ref 4.0–10.5)

## 2017-10-20 ENCOUNTER — Encounter: Payer: Self-pay | Admitting: Gastroenterology

## 2017-10-20 ENCOUNTER — Telehealth: Payer: Self-pay | Admitting: Family Medicine

## 2017-10-20 ENCOUNTER — Ambulatory Visit (INDEPENDENT_AMBULATORY_CARE_PROVIDER_SITE_OTHER): Payer: Medicare Other | Admitting: Gastroenterology

## 2017-10-20 VITALS — BP 109/64 | HR 62 | Ht 59.0 in | Wt 141.5 lb

## 2017-10-20 DIAGNOSIS — K8050 Calculus of bile duct without cholangitis or cholecystitis without obstruction: Secondary | ICD-10-CM

## 2017-10-20 DIAGNOSIS — I6310 Cerebral infarction due to embolism of unspecified precerebral artery: Secondary | ICD-10-CM | POA: Diagnosis not present

## 2017-10-20 NOTE — Telephone Encounter (Signed)
-----   Message from Minna Merritts, MD sent at 10/19/2017  2:33 PM EDT ----- She could take plavix 75 mg daily Hold the asa thx TG  ----- Message ----- From: Leone Haven, MD Sent: 10/13/2017   4:25 PM To: Minna Merritts, MD  Hi Dr Rockey Situ,   I saw Dawn Cooper in follow-up from a hospitalization for a GI bleed following ERCP. She had a duodenal ulcer that was cauterized and was likely related to the ERCP she had. Her hemoglobin went from 12.1 to 8.0. They held her aspirin and plavix. I wanted to see if you could give some guidance regarding whether or not she needs to restart aspirin or plavix or both given her history of STEMI with stent placement in May 2018 and multiple GI bleeds. Please let me know if you have additional questions. Thank you for your help.   Dawn Cooper

## 2017-10-20 NOTE — Progress Notes (Signed)
Primary Care Physician: Leone Haven, MD  Primary Gastroenterologist:  Dr. Lucilla Lame  No chief complaint on file.   HPI: Dawn Cooper is a 84 y.o. female here for follow-up after having an ERCP.  The patient had the ERCP with 3 stones removed.  The patient that time had been off of her Plavix for 5 days but came back the next morning with hematemesis.  The patient underwent an EGD with cauterization of the ampulla at a ulcer site that appeared to be the source of bleeding.  The patient did well and was sent home. Since the patient has returned home she denies having any more black stools or bloody stools.  She also has had no hematemesis and has been feeling well.  Current Outpatient Medications  Medication Sig Dispense Refill  . furosemide (LASIX) 20 MG tablet TAKE 1 TABLET EVERY OTHER DAY. 45 tablet 1  . metoprolol tartrate (LOPRESSOR) 25 MG tablet TAKE 1/2 TABLET TWICE DAILY 90 tablet 2  . nitroGLYCERIN (NITROSTAT) 0.4 MG SL tablet Place 1 tablet (0.4 mg total) under the tongue every 5 (five) minutes x 3 doses as needed for chest pain. 25 tablet 12  . pantoprazole (PROTONIX) 40 MG tablet TAKE 1 TABLET EVERY DAY 90 tablet 1  . potassium chloride (K-DUR,KLOR-CON) 10 MEQ tablet TAKE 1 TABLET EVERY OTHER DAY 45 tablet 3   No current facility-administered medications for this visit.     Allergies as of 10/20/2017 - Review Complete 10/13/2017  Allergen Reaction Noted  . Dabigatran Other (See Comments) 06/07/2014  . Dabigatran etexilate mesylate Other (See Comments) 09/09/2014  . Clonidine Itching and Rash 09/23/2012  . Clonidine derivatives Itching and Rash 09/23/2012  . Penicillins Itching, Rash, and Other (See Comments) 01/14/2011    ROS:  General: Negative for anorexia, weight loss, fever, chills, fatigue, weakness. ENT: Negative for hoarseness, difficulty swallowing , nasal congestion. CV: Negative for chest pain, angina, palpitations, dyspnea on exertion,  peripheral edema.  Respiratory: Negative for dyspnea at rest, dyspnea on exertion, cough, sputum, wheezing.  GI: See history of present illness. GU:  Negative for dysuria, hematuria, urinary incontinence, urinary frequency, nocturnal urination.  Endo: Negative for unusual weight change.    Physical Examination:   There were no vitals taken for this visit.  General: Well-nourished, well-developed in no acute distress.  Eyes: No icterus. Conjunctivae pink. Mouth: Oropharyngeal mucosa moist and pink , no lesions erythema or exudate. Lungs: Clear to auscultation bilaterally. Non-labored. Heart: Regular rate and rhythm, no murmurs rubs or gallops.  Abdomen: Bowel sounds are normal, nontender, nondistended, no hepatosplenomegaly or masses, no abdominal bruits or hernia , no rebound or guarding.   Extremities: No lower extremity edema. No clubbing or deformities. Neuro: Alert and oriented x 3.  Grossly intact. Skin: Warm and dry, no jaundice.   Psych: Alert and cooperative, normal mood and affect.  Labs:    Imaging Studies: Ct Abdomen Pelvis Wo Contrast  Result Date: 10/08/2017 CLINICAL DATA:  84 year old female with abdominal pain and vomiting after ERCP. EXAM: CT ABDOMEN AND PELVIS WITHOUT CONTRAST TECHNIQUE: Multidetector CT imaging of the abdomen and pelvis was performed following the standard protocol without IV contrast. COMPARISON:  MRI dated 10/03/2017 FINDINGS: Evaluation of this exam is limited in the absence of intravenous contrast. Lower chest: Partially visualized small left pleural effusion. There are diffuse chronic interstitial coarsening. Coronary vascular calcification as well as calcification of the aortic valve. No intra-abdominal free air or free fluid. Hepatobiliary: The liver is  unremarkable. No intrahepatic biliary ductal dilatation. Cholecystectomy. There is pneumobilia. No retained calcified stone noted in the CBD. Mild dilatation of the common bile duct measuring 11 mm,  likely post cholecystectomy. Pancreas: Unremarkable. No pancreatic ductal dilatation or surrounding inflammatory changes. Spleen: Small calcified splenic granuloma. The spleen is otherwise unremarkable. Adrenals/Urinary Tract: The adrenal glands, kidneys, visualized ureters, and urinary bladder appear unremarkable. Stomach/Bowel: There is sigmoid diverticulosis without active inflammatory changes. A 2.5 cm distal duodenal diverticulum. There is a small hiatal hernia. There is no bowel obstruction or active inflammation. Appendectomy. Vascular/Lymphatic: Advanced aortoiliac atherosclerotic disease. The aorta is tortuous. There is a 2.5 cm infrarenal aortic ectasia. The IVC is grossly unremarkable. Evaluation of the vasculature is limited in the absence of intravenous contrast. No portal venous gas. There is no adenopathy. Reproductive: The uterus and ovaries are grossly unremarkable. No pelvic mass. Other: Small fat containing umbilical hernia. Musculoskeletal: Osteopenia with degenerative changes and disc desiccation. No acute osseous pathology. Right femoral intra measure rod and transcervical fixation screw. No acute osseous pathology. IMPRESSION: 1. No acute intra-abdominal or pelvic pathology. No pneumoperitoneum or evidence of bowel perforation. 2. Colonic diverticulosis. No bowel obstruction or active inflammation. 3.  Aortic Atherosclerosis (ICD10-I70.0). 4. A 2.5 cm infrarenal aortic ectasia. Ectatic abdominal aorta at risk for aneurysm development. Recommend followup by ultrasound in 5 years. This recommendation follows ACR consensus guidelines: White Paper of the ACR Incidental Findings Committee II on Vascular Findings. J Am Coll Radiol 2013; 10:789-794. Electronically Signed   By: Anner Crete M.D.   On: 10/08/2017 04:56   Dg Chest 2 View  Result Date: 10/17/2017 CLINICAL DATA:  Left pleural effusion. EXAM: CHEST - 2 VIEW COMPARISON:  08/09/2016 FINDINGS: Mild enlargement of the cardiopericardial  silhouette. No mediastinal or hilar masses. No evidence of adenopathy. There is opacity at the left lung base silhouetting the hemidiaphragm. This may reflect pneumonia or atelectasis or a combination. No definite pleural effusion. Lungs demonstrate prominent bronchovascular markings, but are otherwise clear. No right pleural effusion. No pneumothorax. Skeletal structures are demineralized but grossly intact. IMPRESSION: 1. Left lung base opacity consistent pneumonia, atelectasis or a combination. No definite pleural effusion. No evidence of pulmonary edema. 2. Stable cardiomegaly. Electronically Signed   By: Lajean Manes M.D.   On: 10/17/2017 11:48   Mr 3d Recon At Scanner  Result Date: 10/03/2017 CLINICAL DATA:  84 year old female with history of elevated liver function tests. EXAM: MRI ABDOMEN WITHOUT AND WITH CONTRAST (INCLUDING MRCP) TECHNIQUE: Multiplanar multisequence MR imaging of the abdomen was performed both before and after the administration of intravenous contrast. Heavily T2-weighted images of the biliary and pancreatic ducts were obtained, and three-dimensional MRCP images were rendered by post processing. CONTRAST:  29mL MULTIHANCE GADOBENATE DIMEGLUMINE 529 MG/ML IV SOLN COMPARISON:  No priors. Abdominal ultrasound 11/16/2017. CT the abdomen and pelvis 07/23/2016. FINDINGS: Lower chest: Trace left pleural effusion lying dependently. Mild cardiomegaly. Hepatobiliary: No suspicious cystic or solid hepatic lesions. Status post cholecystectomy. No intrahepatic biliary ductal dilatation noted on MRCP images. Common bile duct is mildly dilated proximally measuring up to 12 mm. Distally, common bile duct measures 6 mm. There 3 filling defects in the proximal common bile duct, compatible with choledocholithiasis. The largest of these measures up to 11 mm in diameter. Pancreas: No pancreatic mass. No pancreatic ductal dilatation. No pancreatic or peripancreatic fluid or inflammatory changes. The Spleen:   Unremarkable. Adrenals/Urinary Tract: In the interpolar region of the left kidney there is a 1.4 cm simple cyst. Right  kidney and bilateral adrenal glands are normal in appearance. Small amount of perinephric fluid bilaterally (nonspecific). No hydroureteronephrosis in the visualized portions of the abdomen. Stomach/Bowel: Visualized portions are unremarkable. Vascular/Lymphatic: Aortic atherosclerosis without definite aneurysm in the abdominal vasculature. No lymphadenopathy noted in the abdomen. Other: No significant volume of ascites noted in the visualized portions of the peritoneal cavity. Musculoskeletal: No aggressive appearing osseous lesions are noted in the visualized portions of the skeleton. IMPRESSION: 1. Choledocholithiasis. This is associated with mild common bile duct dilatation, but no intrahepatic biliary ductal dilatation. 2. Trace left pleural effusion. 3. Cardiomegaly. Electronically Signed   By: Vinnie Langton M.D.   On: 10/03/2017 16:04   Dg C-arm 1-60 Min-no Report  Result Date: 10/07/2017 Fluoroscopy was utilized by the requesting physician.  No radiographic interpretation.   Mr Abdomen Mrcp Moise Boring Contast  Result Date: 10/03/2017 CLINICAL DATA:  84 year old female with history of elevated liver function tests. EXAM: MRI ABDOMEN WITHOUT AND WITH CONTRAST (INCLUDING MRCP) TECHNIQUE: Multiplanar multisequence MR imaging of the abdomen was performed both before and after the administration of intravenous contrast. Heavily T2-weighted images of the biliary and pancreatic ducts were obtained, and three-dimensional MRCP images were rendered by post processing. CONTRAST:  38mL MULTIHANCE GADOBENATE DIMEGLUMINE 529 MG/ML IV SOLN COMPARISON:  No priors. Abdominal ultrasound 11/16/2017. CT the abdomen and pelvis 07/23/2016. FINDINGS: Lower chest: Trace left pleural effusion lying dependently. Mild cardiomegaly. Hepatobiliary: No suspicious cystic or solid hepatic lesions. Status post  cholecystectomy. No intrahepatic biliary ductal dilatation noted on MRCP images. Common bile duct is mildly dilated proximally measuring up to 12 mm. Distally, common bile duct measures 6 mm. There 3 filling defects in the proximal common bile duct, compatible with choledocholithiasis. The largest of these measures up to 11 mm in diameter. Pancreas: No pancreatic mass. No pancreatic ductal dilatation. No pancreatic or peripancreatic fluid or inflammatory changes. The Spleen:  Unremarkable. Adrenals/Urinary Tract: In the interpolar region of the left kidney there is a 1.4 cm simple cyst. Right kidney and bilateral adrenal glands are normal in appearance. Small amount of perinephric fluid bilaterally (nonspecific). No hydroureteronephrosis in the visualized portions of the abdomen. Stomach/Bowel: Visualized portions are unremarkable. Vascular/Lymphatic: Aortic atherosclerosis without definite aneurysm in the abdominal vasculature. No lymphadenopathy noted in the abdomen. Other: No significant volume of ascites noted in the visualized portions of the peritoneal cavity. Musculoskeletal: No aggressive appearing osseous lesions are noted in the visualized portions of the skeleton. IMPRESSION: 1. Choledocholithiasis. This is associated with mild common bile duct dilatation, but no intrahepatic biliary ductal dilatation. 2. Trace left pleural effusion. 3. Cardiomegaly. Electronically Signed   By: Vinnie Langton M.D.   On: 10/03/2017 16:04    Assessment and Plan:   Dawn Cooper is a 84 y.o. y/o female who had an ERCP with common bile duct stones removed.  The patient has been doing well since discharge from the hospital after having a sphincterotomy bleed.  The patient's repeat hemoglobin has not come back to her baseline.  The patient has been given samples of vitamins with iron added to take for the next few weeks.  The patient has been told to contact me if she has any further GI problems otherwise follow-up  with her primary care provider.    Lucilla Lame, MD. Marval Regal   Note: This dictation was prepared with Dragon dictation along with smaller phrase technology. Any transcriptional errors that result from this process are unintentional.

## 2017-10-20 NOTE — Telephone Encounter (Signed)
Please let the patient know that I heard back from her cardiologist.  They recommended that she be on Plavix daily and discontinue her aspirin.  Thanks.

## 2017-10-21 ENCOUNTER — Ambulatory Visit (INDEPENDENT_AMBULATORY_CARE_PROVIDER_SITE_OTHER): Payer: Medicare Other | Admitting: *Deleted

## 2017-10-21 DIAGNOSIS — E538 Deficiency of other specified B group vitamins: Secondary | ICD-10-CM | POA: Diagnosis not present

## 2017-10-21 MED ORDER — CYANOCOBALAMIN 1000 MCG/ML IJ SOLN
1000.0000 ug | Freq: Once | INTRAMUSCULAR | Status: AC
Start: 2017-10-21 — End: 2017-10-21
  Administered 2017-10-21: 1000 ug via INTRAMUSCULAR

## 2017-10-21 NOTE — Progress Notes (Signed)
Patient presented for B 12 injection to right deltoid, patient voiced no concerns nor showed any signs of distress during injection. 

## 2017-10-21 NOTE — Telephone Encounter (Signed)
Left message to return call, ok for pec to speak to patient and patients daughter to inform of message below

## 2017-10-23 ENCOUNTER — Ambulatory Visit: Payer: Medicare Other | Admitting: Gastroenterology

## 2017-10-23 NOTE — Telephone Encounter (Signed)
Patients daughter notified and will start Plavix daily and discontinue aspirin

## 2017-10-31 ENCOUNTER — Ambulatory Visit (INDEPENDENT_AMBULATORY_CARE_PROVIDER_SITE_OTHER): Payer: Medicare Other | Admitting: Pulmonary Disease

## 2017-10-31 ENCOUNTER — Encounter: Payer: Self-pay | Admitting: Pulmonary Disease

## 2017-10-31 ENCOUNTER — Telehealth: Payer: Self-pay | Admitting: Radiology

## 2017-10-31 VITALS — BP 112/72 | HR 55 | Ht 59.0 in

## 2017-10-31 DIAGNOSIS — J90 Pleural effusion, not elsewhere classified: Secondary | ICD-10-CM | POA: Diagnosis not present

## 2017-10-31 DIAGNOSIS — I350 Nonrheumatic aortic (valve) stenosis: Secondary | ICD-10-CM

## 2017-10-31 DIAGNOSIS — R0609 Other forms of dyspnea: Secondary | ICD-10-CM

## 2017-10-31 DIAGNOSIS — I509 Heart failure, unspecified: Secondary | ICD-10-CM

## 2017-10-31 DIAGNOSIS — I6310 Cerebral infarction due to embolism of unspecified precerebral artery: Secondary | ICD-10-CM

## 2017-10-31 DIAGNOSIS — D649 Anemia, unspecified: Secondary | ICD-10-CM

## 2017-10-31 NOTE — Telephone Encounter (Signed)
Pt coming in for labs Monday, please place future orders. Thank you 

## 2017-10-31 NOTE — Patient Instructions (Signed)
No further evaluation of the very small collection of fluid around her left lung is necessary at this time.  I believe that most of your shortness of breath is due to your heart problems.  Follow-up with me as needed

## 2017-11-01 NOTE — Addendum Note (Signed)
Addended by: Caryl Bis ERIC G on: 11/01/2017 01:29 PM   Modules accepted: Orders

## 2017-11-02 NOTE — Progress Notes (Signed)
PULMONARY CONSULT NOTE  Requesting MD/Service: Caryl Bis Date of initial consultation: 10/31/17 Reason for consultation: pleural effusion, SOB  PT PROFILE: 84 y.o. female never smoker referred for eval of above  DATA: CTA chest 07/23/16: minimal bronchiectatic changes in RLL PFTs 07/29/16: normal except moderate reduced DLCO CTAP 10/08/17: Very small L pl eff Echo 09/23/16: LVEF 25-30%. Mod-severe AS  Not reported. PASP est 52 mmHg  INTERVAL:  HPI:  As above. She reports SOB, predominantly in the morning for past 6 months. There is little DTD variation. Her symptoms resolve as the day progresses. She reports a throat clearing cough, denies fever, CP, purulent sputum. Also denies orthopnea and PND. She does have chronic mild symmetric ankle edema.   Past Medical History:  Diagnosis Date  . A-fib ([place])   . Broken leg    right  . Cardiomyopathy   . Chronic kidney disease   . GERD (gastroesophageal reflux disease)   . Heart murmur   . Hiatal hernia   . Hypertension   . Mild mitral regurgitation by prior echocardiogram   . Moderate aortic stenosis   . Myocardial infarction ([place])   . Stroke ([place])   . Tricuspid regurgitation     Past Surgical History:  Procedure Laterality Date  . APPENDECTOMY    . CATARACT EXTRACTION  2013   left  . CHOLECYSTECTOMY    . CORONARY ANGIOGRAPHY N/A 08/09/2016   Procedure: Coronary Angiography;  Surgeon: Burnell Blanks, MD;  Location: [facility];  Service: Cardiovascular;  Laterality: N/A;  . CORONARY ANGIOPLASTY    . CORONARY ARTERY BYPASS GRAFT    . CORONARY STENT INTERVENTION N/A 08/09/2016   Procedure: Coronary Stent Intervention;  Surgeon: Burnell Blanks, MD;  Location: [place] [facility];  Service: Cardiovascular;  Laterality: N/A;  . ERCP N/A 10/07/2017   Procedure: ENDOSCOPIC RETROGRADE CHOLANGIOPANCREATOGRAPHY (ERCP);  Surgeon: Lucilla Lame, MD;  Location: [facility] ENDOSCOPY;  Service: Endoscopy;  Laterality: N/A;   . ESOPHAGOGASTRODUODENOSCOPY (EGD) WITH PROPOFOL N/A 10/08/2017   Procedure: ESOPHAGOGASTRODUODENOSCOPY (EGD) WITH PROPOFOL;  Surgeon: Lucilla Lame, MD;  Location: [facility] ENDOSCOPY;  Service: Endoscopy;  Laterality: N/A;  . EYE SURGERY    . FEMUR FRACTURE SURGERY  12/2011   Dr. Sabra Heck, s/p rehab at [facility]  . FRACTURE SURGERY    . GALLBLADDER SURGERY    . IR RADIOLOGY PERIPHERAL GUIDED IV START  07/29/2016  . IR US GUIDE VASC ACCESS RIGHT  07/29/2016  . VAGINAL DELIVERY      MEDICATIONS: I have reviewed all medications and confirmed regimen as documented  Social History   Socioeconomic History  . Marital status: Widowed    Spouse name: Not on file  . Number of children: Not on file  . Years of education: Not on file  . Highest education level: Not on file  Occupational History    Comment: Retired  Scientific laboratory technician  . Financial resource strain: Not hard at all  . Food insecurity:    Worry: Never true    Inability: Never true  . Transportation needs:    Medical: No    Non-medical: No  Tobacco Use  . Smoking status: Never Smoker  . Smokeless tobacco: Never Used  Substance and Sexual Activity  . Alcohol use: Never    Frequency: Never  . Drug use: Never  . Sexual activity: Never  Lifestyle  . Physical activity:    Days per week: 0 days    Minutes per session: Not on file  . Stress: Not  at all  Relationships  . Social connections:    Talks on phone: Patient refused    Gets together: Patient refused    Attends religious service: Patient refused    Active member of club or organization: Patient refused    Attends meetings of clubs or organizations: Patient refused    Relationship status: Patient refused  . Intimate partner violence:    Fear of current or ex partner: No    Emotionally abused: No    Physically abused: No    Forced sexual activity: No  Other Topics Concern  . Not on file  Social History Narrative   Lives in [place] alone. 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     Family History  Problem Relation Age of Onset  . Stroke Sister   . Cancer Brother        pancreatic  . Cancer Sister        pancreatic  . Diabetes Unknown   . Diabetes Brother   . Heart attack Neg Hx     ROS: No fever, myalgias/arthralgias, unexplained weight loss or weight gain No new focal weakness or sensory deficits No otalgia, hearing loss, visual changes, nasal and sinus symptoms, mouth and throat problems No neck pain or adenopathy No abdominal pain, N/V/D, diarrhea, change in bowel pattern No dysuria, change in urinary pattern   Vitals:   10/31/17 1054 10/31/17 1056  BP:  112/72  Pulse:  (!) 55  SpO2:  96%  Height: 4\' 11"  (1.499 m)      EXAM:  Gen: appears much younger than true age, No overt respiratory distress HEENT: NCAT, sclera white, oropharynx normal Neck: Supple without LAN, thyromegaly, JVD Lungs: faint L>R basilar crackles Cardiovascular: RRR, III/VI high pitched syst M radiating to neck Abdomen: Soft, nontender, normal BS Ext: without clubbing, cyanosis. Trace to 1+ symmetric ankle and pretibial edema Neuro: CNs grossly intact, motor and sensory intact Skin: Limited exam, no lesions noted  DATA:   BMP Latest Ref Rng & Units 10/17/2017 10/13/2017 10/09/2017  Glucose 70 - 99 mg/dL 156(H) 105(H) 96  BUN 6 - 23 mg/dL 15 13 32(H)  Creatinine 0.40 - 1.20 mg/dL 1.45(H) 1.39(H) 1.29(H)  BUN/Creat Ratio 12 - 28 - - -  Sodium 135 - 145 mEq/L 139 139 142  Potassium 3.5 - 5.1 mEq/L 3.9 3.6 4.1  Chloride 96 - 112 mEq/L 107 109 116(H)  CO2 19 - 32 mEq/L 23 23 22   Calcium 8.4 - 10.5 mg/dL 8.9 8.9 8.0(L)    CBC Latest Ref Rng & Units 10/17/2017 10/13/2017 10/09/2017  WBC 4.0 - 10.5 K/uL 7.3 7.1 -  Hemoglobin 12.0 - 15.0 g/dL 9.3(L) 8.8 Repeated and verified X2.(L) 8.4(L)  Hematocrit 36.0 - 46.0 % 27.4(L) 25.7 Repeated and verified X2.(L) 24.2(L)  Platelets 150.0 - 400.0 K/uL 216.0 192.0 -    CXR 7/26: LLL atx vs infiltrate. No definite effusion   I  have personally reviewed all chest radiographs reported above including CXRs and CT chest unless otherwise indicated  IMPRESSION:     ICD-10-CM   1. DOE (dyspnea on exertion) R06.09   2. Pleural effusion J90   3. CHF I50.9   4. Aortic stenosis I35.0    The effusion noted on CTAP is very small and not definitely seen on most recent CXR. The LLL opacity on CXR is likely ATX given absence of symptoms to suggest PNA. Her SOB is mild and fully attributable to dilated cardiomyopathy and valvular heart disease  PLAN:  No  further pulmonary evaluation warranted @ this time Cont current therapies as previously prescribed If not already discussed, advanced directives should be addressed Follow up as needed   Merton Border, MD PCCM service Mobile [phone] Pager [phone] 11/02/2017 5:30 PM

## 2017-11-03 ENCOUNTER — Other Ambulatory Visit (INDEPENDENT_AMBULATORY_CARE_PROVIDER_SITE_OTHER): Payer: Medicare Other

## 2017-11-03 DIAGNOSIS — D649 Anemia, unspecified: Secondary | ICD-10-CM | POA: Diagnosis not present

## 2017-11-03 LAB — CBC
HEMATOCRIT: 31.1 % — AB (ref 36.0–46.0)
Hemoglobin: 10.7 g/dL — ABNORMAL LOW (ref 12.0–15.0)
MCHC: 34.6 g/dL (ref 30.0–36.0)
MCV: 93.1 fl (ref 78.0–100.0)
PLATELETS: 233 10*3/uL (ref 150.0–400.0)
RBC: 3.34 Mil/uL — AB (ref 3.87–5.11)
RDW: 14.7 % (ref 11.5–15.5)
WBC: 6.2 10*3/uL (ref 4.0–10.5)

## 2017-11-04 ENCOUNTER — Telehealth: Payer: Self-pay

## 2017-11-04 DIAGNOSIS — D649 Anemia, unspecified: Secondary | ICD-10-CM

## 2017-11-04 NOTE — Telephone Encounter (Signed)
-----   Message from Leone Haven, MD sent at 11/03/2017  4:53 PM EDT ----- Please of the patient's daughter know that her anemia has continued to improve. We should recheck in 2 months.  Please place an order for a CBC for anemia.  Thanks.

## 2017-11-08 NOTE — Progress Notes (Signed)
Cardiology Office Note  Date:  11/10/2017   ID:  Dawn Cooper, DOB 1918-05-28, MRN [id number]  PCP:  Leone Haven, MD   Chief Complaint  Patient presents with  . other    6 month f/u no complaints today. Meds reviewed verbally with pt.    HPI:  Dawn Cooper is a very pleasant 84 year old woman with  severe aortic valve stenosis,   atrial fibrillation (not on anticoagulation secondary to GI bleed) ,  GI bleed from AVM,  stroke June 2016, documented on MRI,  Severe aortic valve stenosis who presents for follow-up of her aortic valve stenosis , chronic atrial fibrillation, hx of STEMI  In follow-up today she reports doing relatively well Chronic fatigue No regular exercise program, walks with a walker Denies any chest pain concerning for angina No recent TIA or stroke symptoms  Spending most of her time in the house Family nearby Denies significant shortness of breath  no near syncope or syncope.  no leg swelling  Long discussion concerning her aortic valve Previous echocardiogram May 2018 with moderate to severe aortic valve stenosis  EKG personally reviewed by myself on todays visit Shows atrial fibrillation with ventricular rate 80 bpm left bundle branch block No change from previous EKGs  Other past medical history reviewed STEMI 08/09/2016  Ost 1st Diag lesion, 70 %stenosed.  A STENT SYNERGY DES 2.5X16 drug eluting stent was successfully placed.  Prox LAD lesion, 100 %stenosed.  Post intervention, there is a 0% residual stenosis.  1. Acute anterolateral STEMI secondary to thrombotic occlusion of known severe mid LAD and diagonal stenosis.  Successful PTCA/DES x 1 mid LAD  Echocardiogram September 23, 2016 LV function remains severely depressed with a large anterolateral/apical wall motion abnormality. The anteroseptal wall and apex are akinetic  Echo 08/10/2016 EF 25 to 30%   presented to the hospital on January 02 2011 with atrial fibrillation with RVR,  elevated BNP, CT scan showing bilateral moderate sized pleural effusions who was started on rate control and pradaxa.  She refused cardioversion after 4 weeks on anticoagulation. At approximately 6 weeks out, she had a AVM bleed and presented to the hospital with melena and hematocrit of 18. She had been having melena for at least one week and did not go to the hospital. She had EGD with treatment of her AVM. All anticoagulation was held.   We had discussed the case with Dr. Gustavo Lah (GI) and it was felt that given her risk of further bleeding from additional AVMs and given her age and the extent of her recent bleed, that no further anticoagulation be used.    on aspirin and Plavix , does not want stronger blood thinners as she is doing well  Previous EKG showing atrial fibrillation with left bundle branch block, rate in the 69s  admitted to the hospital 09/10/2014 for stroke, acute stroke on the left On MRI She recovered well despite initial slurred speech  Last echocardiogram 2/ 2016  hospitalization at the end of February 2016 with stroke type symptoms, stroke on MRI Family had reported a mechanical fall, imaging showed pubic ramus fracture, day prior to admission she was found to be unresponsive and would not answer verbal commands. She became more alert during the daytime, slurred speech. They brought her to the hospital 05/19/2014. She is lethargic, bradycardia, hypotension, given IV fluids with improvement of her symptoms. MRI showing multiple strokes, old Felt to be a high risk bleeding candidate, started on Plavix  Echocardiogram in the hospital  showed ejection fraction 70%, moderate to severe aortic valve stenosis, moderate MR Carotid ultrasound 05/20/2014 showing moderate bilateral carotid arterial disease worse on the left  fall in November 2013. She broke her leg and had surgery with a rod placed on the right.   Echocardiogram while she was in atrial fibrillation  showed ejection fraction 35-45%, normal left ventricular systolic pressures, moderate aortic valve stenosis with mean gradient of 32 mmHg  Previous lab work: total cholesterol 132, LDL 82, HDL 40, triglycerides 502, hemoglobin A1c 4.9  Previous CT Scan of the chest showing moderate bilateral pleural effusions with compressive atelectasis in both lower lobes, no PE   PMH:   has a past medical history of A-fib ([place]), Broken leg, Cardiomyopathy, Chronic kidney disease, GERD (gastroesophageal reflux disease), Heart murmur, Hiatal hernia, Hypertension, Mild mitral regurgitation by prior echocardiogram, Moderate aortic stenosis, Myocardial infarction [facility]), Stroke ([place]), and Tricuspid regurgitation.  PSH:    Past Surgical History:  Procedure Laterality Date  . APPENDECTOMY    . CATARACT EXTRACTION  2013   left  . CHOLECYSTECTOMY    . CORONARY ANGIOGRAPHY N/A 08/09/2016   Procedure: Coronary Angiography;  Surgeon: Burnell Blanks, MD;  Location: [facility];  Service: Cardiovascular;  Laterality: N/A;  . CORONARY ANGIOPLASTY    . CORONARY ARTERY BYPASS GRAFT    . CORONARY STENT INTERVENTION N/A 08/09/2016   Procedure: Coronary Stent Intervention;  Surgeon: Burnell Blanks, MD;  Location: [facility];  Service: Cardiovascular;  Laterality: N/A;  . ERCP N/A 10/07/2017   Procedure: ENDOSCOPIC RETROGRADE CHOLANGIOPANCREATOGRAPHY (ERCP);  Surgeon: Lucilla Lame, MD;  Location: [facility] ENDOSCOPY;  Service: Endoscopy;  Laterality: N/A;  . ESOPHAGOGASTRODUODENOSCOPY (EGD) WITH PROPOFOL N/A 10/08/2017   Procedure: ESOPHAGOGASTRODUODENOSCOPY (EGD) WITH PROPOFOL;  Surgeon: Lucilla Lame, MD;  Location: [facility] ENDOSCOPY;  Service: Endoscopy;  Laterality: N/A;  . EYE SURGERY    . FEMUR FRACTURE SURGERY  12/2011   Dr. Sabra Heck, s/p rehab at [facility]  . FRACTURE SURGERY    . GALLBLADDER SURGERY    . IR RADIOLOGY PERIPHERAL GUIDED IV START  07/29/2016  . IR US GUIDE VASC ACCESS RIGHT   07/29/2016  . VAGINAL DELIVERY      Current Outpatient Medications  Medication Sig Dispense Refill  . clopidogrel (PLAVIX) 75 MG tablet Take 75 mg by mouth daily.    . furosemide (LASIX) 20 MG tablet TAKE 1 TABLET EVERY OTHER DAY. 45 tablet 1  . metoprolol tartrate (LOPRESSOR) 25 MG tablet TAKE 1/2 TABLET TWICE DAILY 90 tablet 2  . nitroGLYCERIN (NITROSTAT) 0.4 MG SL tablet Place 1 tablet (0.4 mg total) under the tongue every 5 (five) minutes x 3 doses as needed for chest pain. 25 tablet 12  . pantoprazole (PROTONIX) 40 MG tablet TAKE 1 TABLET EVERY DAY 90 tablet 1  . potassium chloride (K-DUR,KLOR-CON) 10 MEQ tablet TAKE 1 TABLET EVERY OTHER DAY 45 tablet 3   No current facility-administered medications for this visit.      Allergies:   Dabigatran; Dabigatran etexilate mesylate; Clonidine; Clonidine derivatives; and Penicillins   Social History:  The patient  reports that she has never smoked. She has never used smokeless tobacco. She reports that she does not drink alcohol or use drugs.   Family History:   family history includes Cancer in her brother and sister; Diabetes in her brother and unknown relative; Stroke in her sister.    Review of Systems: Review of Systems  Constitutional: Positive for malaise/fatigue.  Respiratory: Negative.  Cardiovascular: Negative.   Gastrointestinal: Negative.   Musculoskeletal: Negative.        Leg weakness  Neurological: Negative.   Psychiatric/Behavioral: Negative.   All other systems reviewed and are negative.    PHYSICAL EXAM: VS:  BP 120/80 (BP Location: Left Arm, Patient Position: Sitting, Cuff Size: Normal)   Pulse 80   Ht 5\' 4"  (1.626 m)   Wt 136 lb 4 oz (61.8 kg)   BMI 23.39 kg/m  , BMI Body mass index is 23.39 kg/m. Constitutional:  oriented to person, place, and time. No distress.  HENT:  Head: Normocephalic and atraumatic.  Eyes:  no discharge. No scleral icterus.  Neck: Normal range of motion. Neck supple. No JVD  present.  Cardiovascular: Normal rate, regular rhythm, normal heart sounds and intact distal pulses. Exam reveals no gallop and no friction rub. No edema 3/6 systolic ejection murmur appreciated right sternal border Pulmonary/Chest: Effort normal and breath sounds normal. No stridor. No respiratory distress.  no wheezes.  no rales.  no tenderness.  Abdominal: Soft.  no distension.  no tenderness.  Musculoskeletal: Normal range of motion.  no  tenderness or deformity.  Neurological:  normal muscle tone. Coordination normal. No atrophy Skin: Skin is warm and dry. No rash noted. not diaphoretic.  Psychiatric:  normal mood and affect. behavior is normal. Thought content normal.    Recent Labs: 10/07/2017: ALT 22 10/08/2017: TSH 1.544 10/17/2017: BUN 15; Creatinine, Ser 1.45; Potassium 3.9; Sodium 139 11/03/2017: Hemoglobin 10.7; Platelets 233.0    Lipid Panel Lab Results  Component Value Date   CHOL 154 08/09/2016   HDL 33 (L) 08/09/2016   LDLCALC 102 (H) 08/09/2016   TRIG 94 08/09/2016      Wt Readings from Last 3 Encounters:  11/10/17 136 lb 4 oz (61.8 kg)  10/20/17 141 lb 8 oz (64.2 kg)  10/13/17 141 lb 12.8 oz (64.3 kg)       ASSESSMENT AND PLAN:  Chronic atrial fibrillation (HCC) -   history of strokes.    she has declined warfarin or other more aggressive anticoagulation for atrial fibrillation. She is aware of risk of stroke on her current regimen. She is concerned about recurrent GI bleeding.  Discussed again with the patient and her daughter again today High fall risk  Cerebrovascular accident (CVA) due to embolism of precerebral artery ([place])  Documented on MRI.  High fall risk and patient has declined No change to her medication will stay on Plavix for coronary disease and history of stent  Essential hypertension - Plan: EKG 12-Lead, ECHOCARDIOGRAM COMPLETE Blood pressure is well controlled on today's visit. No changes made to the medications. Stable  Aortic  valve stenosis, severe -  Currently asymptomatic Echocardiogram ordered Very frail, may not be a TAVR candidate  CKD (chronic kidney disease), stage III  stable renal function Avoid NSAIDs  STEMI Mid LAD stent, recovered well On Plavix And aspirin held for GI bleed history   Total encounter time more than 25 minutes  Greater than 50% was spent in counseling and coordination of care with the patient   Disposition:   F/U  12 months   Orders Placed This Encounter  Procedures  . EKG 12-Lead     Signed, Esmond Plants, M.D., Ph.D. 11/10/2017  [place], [place]

## 2017-11-10 ENCOUNTER — Encounter: Payer: Self-pay | Admitting: Cardiovascular Disease

## 2017-11-10 ENCOUNTER — Ambulatory Visit (INDEPENDENT_AMBULATORY_CARE_PROVIDER_SITE_OTHER): Payer: Medicare Other | Admitting: Cardiovascular Disease

## 2017-11-10 VITALS — BP 120/80 | HR 80 | Ht 64.0 in | Wt 136.2 lb

## 2017-11-10 DIAGNOSIS — I482 Chronic atrial fibrillation, unspecified: Secondary | ICD-10-CM

## 2017-11-10 DIAGNOSIS — N183 Chronic kidney disease, stage 3 unspecified: Secondary | ICD-10-CM

## 2017-11-10 DIAGNOSIS — I25118 Atherosclerotic heart disease of native coronary artery with other forms of angina pectoris: Secondary | ICD-10-CM | POA: Diagnosis not present

## 2017-11-10 DIAGNOSIS — I6310 Cerebral infarction due to embolism of unspecified precerebral artery: Secondary | ICD-10-CM | POA: Diagnosis not present

## 2017-11-10 DIAGNOSIS — I350 Nonrheumatic aortic (valve) stenosis: Secondary | ICD-10-CM

## 2017-11-10 NOTE — Patient Instructions (Signed)
Medication Instructions:   No medication changes made  Labwork:  No new labs needed  Testing/Procedures:  Echocardiogram for aortic valve stenosis   Follow-Up: It was a pleasure seeing you in the office today. Please call us if you have new issues that need to be addressed before your next appt.  [phone]  Your physician wants you to follow-up in: 12 months.  You will receive a reminder letter in the mail two months in advance. If you don't receive a letter, please call our office to schedule the follow-up appointment.  If you need a refill on your cardiac medications before your next appointment, please call your pharmacy.  For educational health videos Log in to : [email or web address] Or : [email or web address], password : [email or web address]

## 2017-11-19 ENCOUNTER — Other Ambulatory Visit: Payer: Self-pay

## 2017-11-19 ENCOUNTER — Ambulatory Visit (INDEPENDENT_AMBULATORY_CARE_PROVIDER_SITE_OTHER): Payer: Medicare Other

## 2017-11-19 DIAGNOSIS — I350 Nonrheumatic aortic (valve) stenosis: Secondary | ICD-10-CM

## 2017-11-24 ENCOUNTER — Other Ambulatory Visit: Payer: Self-pay | Admitting: Family Medicine

## 2017-11-26 ENCOUNTER — Ambulatory Visit (INDEPENDENT_AMBULATORY_CARE_PROVIDER_SITE_OTHER): Payer: Medicare Other | Admitting: *Deleted

## 2017-11-26 DIAGNOSIS — E538 Deficiency of other specified B group vitamins: Secondary | ICD-10-CM

## 2017-11-26 DIAGNOSIS — Z23 Encounter for immunization: Secondary | ICD-10-CM

## 2017-11-26 MED ORDER — CYANOCOBALAMIN 1000 MCG/ML IJ SOLN
1000.0000 ug | Freq: Once | INTRAMUSCULAR | Status: AC
  Administered 2017-11-26: 1000 ug via INTRAMUSCULAR

## 2017-11-26 NOTE — Progress Notes (Signed)
Patient presented for B 12 injection to left deltoid, patient voiced no concerns nor showed any signs of distress during injection. 

## 2017-12-30 ENCOUNTER — Other Ambulatory Visit: Payer: Self-pay | Admitting: Family Medicine

## 2017-12-30 ENCOUNTER — Ambulatory Visit (INDEPENDENT_AMBULATORY_CARE_PROVIDER_SITE_OTHER): Payer: Medicare Other

## 2017-12-30 ENCOUNTER — Other Ambulatory Visit: Payer: Self-pay | Admitting: Cardiovascular Disease

## 2017-12-30 DIAGNOSIS — E538 Deficiency of other specified B group vitamins: Secondary | ICD-10-CM

## 2017-12-30 MED ORDER — CYANOCOBALAMIN 1000 MCG/ML IJ SOLN
1000.0000 ug | Freq: Once | INTRAMUSCULAR | Status: AC
  Administered 2017-12-30: 1000 ug via INTRAMUSCULAR

## 2017-12-30 NOTE — Progress Notes (Signed)
B12 injection given in right deltoid patient tolerated well

## 2017-12-31 NOTE — Progress Notes (Signed)
I have reviewed the above note and agree.  Eric Sonnenberg, M.D.  

## 2018-01-05 ENCOUNTER — Other Ambulatory Visit (INDEPENDENT_AMBULATORY_CARE_PROVIDER_SITE_OTHER): Payer: Medicare Other

## 2018-01-05 DIAGNOSIS — D649 Anemia, unspecified: Secondary | ICD-10-CM

## 2018-01-05 LAB — CBC
HEMATOCRIT: 37 % (ref 36.0–46.0)
HEMOGLOBIN: 12.3 g/dL (ref 12.0–15.0)
MCHC: 33.2 g/dL (ref 30.0–36.0)
MCV: 92.1 fl (ref 78.0–100.0)
Platelets: 205 10*3/uL (ref 150.0–400.0)
RBC: 4.02 Mil/uL (ref 3.87–5.11)
RDW: 15.1 % (ref 11.5–15.5)
WBC: 7.1 10*3/uL (ref 4.0–10.5)

## 2018-02-04 ENCOUNTER — Ambulatory Visit (INDEPENDENT_AMBULATORY_CARE_PROVIDER_SITE_OTHER): Payer: Medicare Other | Admitting: *Deleted

## 2018-02-04 DIAGNOSIS — E538 Deficiency of other specified B group vitamins: Secondary | ICD-10-CM | POA: Diagnosis not present

## 2018-02-04 MED ORDER — CYANOCOBALAMIN 1000 MCG/ML IJ SOLN
1000.0000 ug | Freq: Once | INTRAMUSCULAR | Status: AC
  Administered 2018-02-04: 1000 ug via INTRAMUSCULAR

## 2018-02-04 NOTE — Progress Notes (Signed)
Patient presented for B 12 injection to left deltoid, patient voiced no concerns nor showed any signs of distress during injection. 

## 2018-02-10 ENCOUNTER — Other Ambulatory Visit: Payer: Self-pay

## 2018-02-20 ENCOUNTER — Ambulatory Visit: Payer: Medicare Other

## 2018-03-20 ENCOUNTER — Other Ambulatory Visit: Payer: Self-pay | Admitting: Cardiovascular Disease

## 2018-03-20 ENCOUNTER — Ambulatory Visit (INDEPENDENT_AMBULATORY_CARE_PROVIDER_SITE_OTHER): Payer: Medicare Other | Admitting: Family Medicine

## 2018-03-20 ENCOUNTER — Ambulatory Visit (INDEPENDENT_AMBULATORY_CARE_PROVIDER_SITE_OTHER): Payer: Medicare Other

## 2018-03-20 ENCOUNTER — Encounter: Payer: Self-pay | Admitting: Family Medicine

## 2018-03-20 VITALS — BP 100/62 | HR 68 | Resp 20 | Ht 64.0 in | Wt 139.2 lb

## 2018-03-20 VITALS — BP 100/62 | HR 68 | Ht 64.0 in | Wt 139.2 lb

## 2018-03-20 DIAGNOSIS — E538 Deficiency of other specified B group vitamins: Secondary | ICD-10-CM

## 2018-03-20 DIAGNOSIS — R609 Edema, unspecified: Secondary | ICD-10-CM

## 2018-03-20 DIAGNOSIS — I6310 Cerebral infarction due to embolism of unspecified precerebral artery: Secondary | ICD-10-CM

## 2018-03-20 DIAGNOSIS — I25118 Atherosclerotic heart disease of native coronary artery with other forms of angina pectoris: Secondary | ICD-10-CM

## 2018-03-20 DIAGNOSIS — Z23 Encounter for immunization: Secondary | ICD-10-CM | POA: Diagnosis not present

## 2018-03-20 DIAGNOSIS — K920 Hematemesis: Secondary | ICD-10-CM

## 2018-03-20 DIAGNOSIS — I10 Essential (primary) hypertension: Secondary | ICD-10-CM | POA: Diagnosis not present

## 2018-03-20 DIAGNOSIS — Z0000 Encounter for general adult medical examination without abnormal findings: Secondary | ICD-10-CM

## 2018-03-20 LAB — CBC
HEMATOCRIT: 36.6 % (ref 36.0–46.0)
Hemoglobin: 12.4 g/dL (ref 12.0–15.0)
MCHC: 34 g/dL (ref 30.0–36.0)
MCV: 95.2 fl (ref 78.0–100.0)
Platelets: 179 10*3/uL (ref 150.0–400.0)
RBC: 3.84 Mil/uL — ABNORMAL LOW (ref 3.87–5.11)
RDW: 14.9 % (ref 11.5–15.5)
WBC: 6.1 10*3/uL (ref 4.0–10.5)

## 2018-03-20 LAB — COMPREHENSIVE METABOLIC PANEL
ALBUMIN: 3.4 g/dL — AB (ref 3.5–5.2)
ALT: 8 U/L (ref 0–35)
AST: 20 U/L (ref 0–37)
Alkaline Phosphatase: 63 U/L (ref 39–117)
BUN: 17 mg/dL (ref 6–23)
CHLORIDE: 108 meq/L (ref 96–112)
CO2: 26 meq/L (ref 19–32)
CREATININE: 1.54 mg/dL — AB (ref 0.40–1.20)
Calcium: 9.5 mg/dL (ref 8.4–10.5)
GFR: 33.1 mL/min — ABNORMAL LOW (ref >60.00)
GLUCOSE: 110 mg/dL — AB (ref 70–99)
Potassium: 4.2 mEq/L (ref 3.5–5.1)
SODIUM: 141 meq/L (ref 135–145)
Total Bilirubin: 0.8 mg/dL (ref 0.2–1.2)
Total Protein: 6.5 g/dL (ref 6.0–8.3)

## 2018-03-20 MED ORDER — CYANOCOBALAMIN 1000 MCG/ML IJ SOLN
1000.0000 ug | Freq: Once | INTRAMUSCULAR | Status: AC
  Administered 2018-03-20: 1000 ug via INTRAMUSCULAR

## 2018-03-20 NOTE — Assessment & Plan Note (Signed)
Mild chronic stable dyspnea on exertion that is unchanged.  Otherwise asymptomatic.  She will continue her current medication regimen.  Should be evaluated if she has worsening symptoms.

## 2018-03-20 NOTE — Patient Instructions (Signed)
Nice to see you. We will get lab work today and contact you with the results. If you have recurrent bleeding in your stool or you throw up blood please be evaluated.

## 2018-03-20 NOTE — Assessment & Plan Note (Signed)
She has had no recurrence.  She will continue Protonix.  Will check CBC.

## 2018-03-20 NOTE — Patient Instructions (Addendum)
  Dawn Cooper , Thank you for taking time to come for your Medicare Wellness Visit. I appreciate your ongoing commitment to your health goals. Please review the following plan we discussed and let me know if I can assist you in the future.   Follow up as needed.    Bring a copy of your [place] and/or Living Will to be scanned into chart upon completion.  Happy New Year!  These are the goals we discussed: Goals    . Maintain healthy lifestyle       This is a list of the screening recommended for you and due dates:  Health Maintenance  Topic Date Due  . DEXA scan (bone density measurement)  03/21/2019*  . Tetanus Vaccine  03/20/2028  . Flu Shot  Completed  . Pneumonia vaccines  Completed  *Topic was postponed. The date shown is not the original due date.

## 2018-03-20 NOTE — Assessment & Plan Note (Signed)
Well-controlled.  Check CMP.

## 2018-03-20 NOTE — Progress Notes (Signed)
  Tommi Rumps, MD Phone: [phone]  Dawn Cooper is a 84 y.o. female who presents today for f/u.  CC: htn, cad, GI bleed, b12 deficiency  HYPERTENSION  Disease Monitoring  Home BP Monitoring not checking Chest pain- no    Dyspnea-mild and chronic, unchanged Medications  Compliance-  Taking lasix and metoprolol.  Edema- no  CAD: She has had no chest pain.  She continues on Plavix.  History of GI bleed: She notes no blood in her stool.  She notes no melena.  She does note occasional dark normally formed stool when she takes her vitamins though she does not take vitamins very frequently and otherwise her stools are brown and normally formed.  She continues on Protonix.  B12 deficiency: Patient is due for her B12 injection.  Patient does not have an advanced directive in place.  Forms were given for her to review.    Social History   Tobacco Use  Smoking Status Never Smoker  Smokeless Tobacco Never Used     ROS see history of present illness  Objective  Physical Exam Vitals:   03/20/18 1018  BP: 100/62  Pulse: 68  SpO2: 98%    BP Readings from Last 3 Encounters:  03/20/18 100/62  03/20/18 100/62  11/10/17 120/80   Wt Readings from Last 3 Encounters:  03/20/18 139 lb 3.2 oz (63.1 kg)  03/20/18 139 lb 3.2 oz (63.1 kg)  11/10/17 136 lb 4 oz (61.8 kg)    Physical Exam Constitutional:      General: She is not in acute distress.    Appearance: She is not diaphoretic.  Cardiovascular:     Rate and Rhythm: Normal rate and regular rhythm.     Heart sounds: Normal heart sounds.  Pulmonary:     Effort: Pulmonary effort is normal.     Breath sounds: Normal breath sounds.  Abdominal:     General: Bowel sounds are normal. There is no distension.     Palpations: Abdomen is soft.     Tenderness: There is no abdominal tenderness.  Skin:    General: Skin is warm and dry.  Neurological:     Mental Status: She is alert.      Assessment/Plan: Please see  individual problem list.  HTN (hypertension) Well-controlled.  Check CMP.  Coronary artery disease of native artery of native heart with stable angina pectoris (HCC) Mild chronic stable dyspnea on exertion that is unchanged.  Otherwise asymptomatic.  She will continue her current medication regimen.  Should be evaluated if she has worsening symptoms.  Gastrointestinal hemorrhage with hematemesis She has had no recurrence.  She will continue Protonix.  Will check CBC.  B12 deficiency B12 injection given today.   Health Maintenance: Advanced directive information given.  Patient declines DEXA scan.  Tdap given.  Orders Placed This Encounter  Procedures  . Tdap vaccine greater than or equal to 7yo IM  . Comp Met (CMET)  . CBC    Meds ordered this encounter  Medications  . cyanocobalamin ((VITAMIN B-12)) injection 1,000 mcg     Tommi Rumps, MD [place]

## 2018-03-20 NOTE — Assessment & Plan Note (Signed)
B12 injection given today. 

## 2018-03-20 NOTE — Progress Notes (Signed)
Subjective:   Dawn Cooper is a 84 y.o. female who presents for Medicare Annual (Subsequent) preventive examination.  Review of Systems:  No ROS.  Medicare Wellness Visit. Additional risk factors are reflected in the social history. Cardiac Risk Factors include: advanced age (>38men, >45 women);diabetes mellitus;hypertension     Objective:     Vitals: BP 100/62 (Patient Position: Sitting, Cuff Size: Normal)   Pulse 68   Temp 98 F (36.7 C) (Oral)   Resp 20   Ht 5\' 4"  (1.626 m)   Wt 139 lb 3.2 oz (63.1 kg)   SpO2 98%   BMI 23.89 kg/m   Body mass index is 23.89 kg/m.  Advanced Directives 03/20/2018 10/08/2017 10/07/2017 02/19/2017 08/11/2016 08/09/2016 08/07/2016  Does Patient Have a Medical Advance Directive? No No No No No No No  Type of Advance Directive - - - - - - -  Copy of Healthcare Power of Attorney in Chart? - - - - - - -  Would patient like information on creating a medical advance directive? Yes (MAU/Ambulatory/Procedural Areas - Information given) No - Patient declined - Yes (MAU/Ambulatory/Procedural Areas - Information given) No - Patient declined - No - Patient declined    Tobacco Social History   Tobacco Use  Smoking Status Never Smoker  Smokeless Tobacco Never Used     Counseling given: Not Answered   Clinical Intake:  Pre-visit preparation completed: Yes           How often do you need to have someone help you when you read instructions, pamphlets, or other written materials from your doctor or pharmacy?: 4 - Often        Past Medical History:  Diagnosis Date  . A-fib ([place])   . Broken leg    right  . Cardiomyopathy   . Chronic kidney disease   . GERD (gastroesophageal reflux disease)   . Heart murmur   . Hiatal hernia   . Hypertension   . Mild mitral regurgitation by prior echocardiogram   . Moderate aortic stenosis   . Myocardial infarction ([place])   . Stroke ([place])   . Tricuspid regurgitation    Past Surgical History:    Procedure Laterality Date  . APPENDECTOMY    . CATARACT EXTRACTION  2013   left  . CHOLECYSTECTOMY    . CORONARY ANGIOGRAPHY N/A 08/09/2016   Procedure: Coronary Angiography;  Surgeon: Burnell Blanks, MD;  Location: [place] [facility];  Service: Cardiovascular;  Laterality: N/A;  . CORONARY ANGIOPLASTY    . CORONARY ARTERY BYPASS GRAFT    . CORONARY STENT INTERVENTION N/A 08/09/2016   Procedure: Coronary Stent Intervention;  Surgeon: Burnell Blanks, MD;  Location: [place] [facility];  Service: Cardiovascular;  Laterality: N/A;  . ERCP N/A 10/07/2017   Procedure: ENDOSCOPIC RETROGRADE CHOLANGIOPANCREATOGRAPHY (ERCP);  Surgeon: Lucilla Lame, MD;  Location: [facility] ENDOSCOPY;  Service: Endoscopy;  Laterality: N/A;  . ESOPHAGOGASTRODUODENOSCOPY (EGD) WITH PROPOFOL N/A 10/08/2017   Procedure: ESOPHAGOGASTRODUODENOSCOPY (EGD) WITH PROPOFOL;  Surgeon: Lucilla Lame, MD;  Location: [facility] ENDOSCOPY;  Service: Endoscopy;  Laterality: N/A;  . EYE SURGERY    . FEMUR FRACTURE SURGERY  12/2011   Dr. Sabra Heck, s/p rehab at [facility]  . FRACTURE SURGERY    . GALLBLADDER SURGERY    . IR RADIOLOGY PERIPHERAL GUIDED IV START  07/29/2016  . IR US GUIDE VASC ACCESS RIGHT  07/29/2016  . VAGINAL DELIVERY     Family History  Problem Relation Age of Onset  .  Stroke Sister   . Cancer Brother        pancreatic  . Cancer Sister        pancreatic  . Diabetes Other   . Diabetes Brother   . Heart attack Neg Hx    Social History   Socioeconomic History  . Marital status: Widowed    Spouse name: Not on file  . Number of children: Not on file  . Years of education: Not on file  . Highest education level: Not on file  Occupational History    Comment: Retired  Scientific laboratory technician  . Financial resource strain: Not hard at all  . Food insecurity:    Worry: Never true    Inability: Never true  . Transportation needs:    Medical: No    Non-medical: No  Tobacco Use  . Smoking status: Never Smoker  .  Smokeless tobacco: Never Used  Substance and Sexual Activity  . Alcohol use: Never    Frequency: Never  . Drug use: Never  . Sexual activity: Never  Lifestyle  . Physical activity:    Days per week: 0 days    Minutes per session: Not on file  . Stress: Not at all  Relationships  . Social connections:    Talks on phone: Patient refused    Gets together: Patient refused    Attends religious service: Patient refused    Active member of club or organization: Patient refused    Attends meetings of clubs or organizations: Patient refused    Relationship status: Patient refused  Other Topics Concern  . Not on file  Social History Narrative   Lives in [place] alone. 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    Outpatient Encounter Medications as of 03/20/2018  Medication Sig  . clopidogrel (PLAVIX) 75 MG tablet TAKE 1 TABLET EVERY OTHER DAY  . furosemide (LASIX) 20 MG tablet TAKE 1 TABLET EVERY OTHER DAY.  . metoprolol tartrate (LOPRESSOR) 25 MG tablet TAKE 1/2 TABLET TWICE DAILY  . nitroGLYCERIN (NITROSTAT) 0.4 MG SL tablet Place 1 tablet (0.4 mg total) under the tongue every 5 (five) minutes x 3 doses as needed for chest pain.  . pantoprazole (PROTONIX) 40 MG tablet TAKE 1 TABLET EVERY DAY  . potassium chloride (K-DUR,KLOR-CON) 10 MEQ tablet TAKE 1 TABLET EVERY OTHER DAY   No facility-administered encounter medications on file as of 03/20/2018.     Activities of Daily Living In your present state of health, do you have any difficulty performing the following activities: 03/20/2018 10/08/2017  Hearing? N -  Vision? N -  Difficulty concentrating or making decisions? Y -  Walking or climbing stairs? Y -  Comment Ambulates with walker or cane in use.  Unsteady gait.  -  Dressing or bathing? N -  Doing errands, shopping? Y N  Comment She does not drive [place] and eating ? Y -  Comment Difficulty preparing meals. She does not cook.  Daughter assists. Self feeds.  -    Using the Toilet? N -  In the past six months, have you accidently leaked urine? N -  Do you have problems with loss of bowel control? N -  Managing your Medications? Y -  Comment Daughter assists -  Managing your Finances? Y -  Comment Daughter assists -  Housekeeping or managing your Housekeeping? N -  Comment She paces herself. Daughter assists. -  Some recent data might be hidden    Patient Care Team: Tommi Rumps  G, MD as PCP - General (Family Medicine) Minna Merritts, MD as Consulting Physician (Cardiology) Lollie Sails, MD as Physician Assistant (Internal Medicine)    Assessment:   This is a routine wellness examination for [facility].  Health Screenings  Mammogram -06/14/13 Colonoscopy-06/14/13 Glaucoma- none reported Hearing- no difficulty hearing conversational tones Dental-dentures  Social  Alcohol intake-no Smoking history- none Smokers in home? none Illicit drug use? none Exercise- walking around the home. Ambulates with walker or cane. Diet-regular  Safety  Patient feels safe at home.  Patient does have smoke detectors at home  Patient does wear sunscreen or protective clothing when in direct sunlight . Patient does wear seat belt when riding with others. She does not drive.   Activities of Daily Living Patient paces herself and can do their own household chores. Denies needing assistance with: feeding themselves, getting from bed to chair, getting to the toilet, bathing/showering, dressing. Daughter assists with managing finances, meal prep, medications and driving.  Depression Screen Patient denies losing interest in daily life, feeling hopeless, or crying easily over simple problems.   Fall Screen Patient denies being afraid of falling. One fall in the last year, without injury.    Immunizations The following Immunizations are up to date: Influenza, pneumonia, and tetanus.   Other Providers Patient Care Team: Leone Haven, MD as  PCP - General (Family Medicine) Minna Merritts, MD as Consulting Physician (Cardiology) Lollie Sails, MD as Physician Assistant (Internal Medicine)  Exercise Activities and Dietary recommendations Current Exercise Habits: Home exercise routine, Type of exercise: walking, Time (Minutes): 10, Frequency (Times/Week): 7, Weekly Exercise (Minutes/Week): 70, Intensity: Mild  Goals    . Maintain healthy lifestyle       Fall Risk Fall Risk  03/20/2018 02/10/2018 02/19/2017 04/01/2016 02/20/2016  Falls in the past year? 0 0 No No Yes  Comment - [facility]i Telephone Survey: data to providers prior to load - - -  Number falls in past yr: 0 - - - 2 or more  Injury with Fall? 0 - - - -  Follow up - - - - Education provided;Falls prevention discussed   Depression Screen PHQ 2/9 Scores 03/20/2018 02/19/2017 02/20/2016 06/14/2013  PHQ - 2 Score 0 0 1 0  PHQ- 9 Score - 1 - -     Cognitive Function MMSE - Mini Mental State Exam 03/20/2018  Not completed: Unable to complete     6CIT Screen 02/19/2017 02/20/2016  What Year? 4 points 0 points  What month? 0 points 0 points  What time? 0 points 0 points  Count back from 20 0 points 0 points  Months in reverse 0 points 0 points  Repeat phrase 0 points -  Total Score 4 -    Immunization History  Administered Date(s) Administered  . Influenza, High Dose Seasonal PF 12/12/2015, 01/01/2017, 11/26/2017  . Influenza,inj,Quad PF,6+ Mos 02/11/2013, 12/17/2013, 12/20/2014  . Influenza-Unspecified 12/25/2011, 02/11/2013, 12/17/2013, 12/20/2014, 12/12/2015  . Pneumococcal Conjugate-13 03/22/2014  . Pneumococcal Polysaccharide-23 12/25/2011  . Tdap 03/20/2018   Screening Tests Health Maintenance  Topic Date Due  . DEXA SCAN  03/21/2019 (Originally 10/16/1985)  . TETANUS/TDAP  03/20/2028  . INFLUENZA VACCINE  Completed  . PNA vac Low Risk Adult  Completed      Plan:    End of life planning; Advance aging; Advanced directives discussed. Copy  of current HCPOA/Living Will requested upon completion.    I have personally reviewed and noted the following in the patient's chart:   .  Medical and social history . Use of alcohol, tobacco or illicit drugs  . Current medications and supplements . Functional ability and status . Nutritional status . Physical activity . Advanced directives . List of other physicians . Hospitalizations, surgeries, and ER visits in previous 12 months . Vitals . Screenings to include cognitive, depression, and falls . Referrals and appointments  In addition, I have reviewed and discussed with patient certain preventive protocols, quality metrics, and best practice recommendations. A written personalized care plan for preventive services as well as general preventive health recommendations were provided to patient.     Varney Biles, LPN  [phone]

## 2018-03-28 ENCOUNTER — Other Ambulatory Visit: Payer: Self-pay | Admitting: Family Medicine

## 2018-03-28 DIAGNOSIS — N183 Chronic kidney disease, stage 3 unspecified: Secondary | ICD-10-CM

## 2018-03-28 NOTE — Progress Notes (Signed)
I have reviewed the above note and agree.  Redina Zeller, M.D.  

## 2018-04-13 ENCOUNTER — Other Ambulatory Visit: Payer: Self-pay | Admitting: Family Medicine

## 2018-04-13 ENCOUNTER — Other Ambulatory Visit: Payer: Self-pay | Admitting: Cardiovascular Disease

## 2018-04-21 ENCOUNTER — Ambulatory Visit (INDEPENDENT_AMBULATORY_CARE_PROVIDER_SITE_OTHER): Payer: Medicare Other | Admitting: Lab

## 2018-04-21 DIAGNOSIS — E538 Deficiency of other specified B group vitamins: Secondary | ICD-10-CM | POA: Diagnosis not present

## 2018-04-21 MED ORDER — CYANOCOBALAMIN 1000 MCG/ML IJ SOLN
1000.0000 ug | Freq: Once | INTRAMUSCULAR | Status: AC
  Administered 2018-04-21: 1000 ug via INTRAMUSCULAR

## 2018-04-21 NOTE — Progress Notes (Signed)
Pt here today for Vit B12 shot in left Deltoid and tolerated well.

## 2018-04-28 NOTE — Progress Notes (Signed)
I have reviewed the above note and agree.  Toshiro Hanken, M.D.  

## 2018-05-11 ENCOUNTER — Other Ambulatory Visit: Payer: Self-pay | Admitting: Family Medicine

## 2018-05-26 ENCOUNTER — Ambulatory Visit: Payer: Medicare Other

## 2018-06-02 ENCOUNTER — Ambulatory Visit (INDEPENDENT_AMBULATORY_CARE_PROVIDER_SITE_OTHER): Payer: Medicare Other

## 2018-06-02 DIAGNOSIS — E538 Deficiency of other specified B group vitamins: Secondary | ICD-10-CM

## 2018-06-02 MED ORDER — CYANOCOBALAMIN 1000 MCG/ML IJ SOLN
1000.0000 ug | Freq: Once | INTRAMUSCULAR | Status: AC
  Administered 2018-06-02: 1000 ug via INTRAMUSCULAR

## 2018-06-02 NOTE — Progress Notes (Signed)
Patient came in today for B-12 injection given in right deltoid. Patient tolerated well.

## 2018-06-26 ENCOUNTER — Telehealth: Payer: Self-pay | Admitting: Family Medicine

## 2018-06-26 NOTE — Telephone Encounter (Signed)
Caller needs clarification on how patient should be taking her plavix.  Please call [phone]

## 2018-06-29 NOTE — Telephone Encounter (Signed)
Sent to PCP just to confirm pt should be taking 75 mg every other day correct?

## 2018-06-29 NOTE — Telephone Encounter (Signed)
It has been prescribed every other day. Has she been taking it that way or has she been taking it daily?

## 2018-06-30 NOTE — Telephone Encounter (Signed)
Called and spoke with patient's daughter advised that we are waiting to hear back from her cardiologist doctor then we can advise correct dose.

## 2018-06-30 NOTE — Telephone Encounter (Signed)
Called and spoke with pt's daughter. Daughter advised and stated that he mother has been taking it once daily. Daughter advised and understandings that it should be once daily every other day.  Sent to PCP

## 2018-06-30 NOTE — Telephone Encounter (Signed)
Noted. I am going to forward to her cardiologist, Dr Rockey Situ, to make sure the prescription is correct. It appears she was discharged from the hospital in May 2018 with the plavix instructions reading to take this every other day. I want to see if this is correct or should she be taking this daily.

## 2018-06-30 NOTE — Telephone Encounter (Signed)
I think we had the plavix going daily unless sign of new bleed thx Tg

## 2018-06-30 NOTE — Telephone Encounter (Signed)
Let me know what the cardiologist suggest

## 2018-07-01 ENCOUNTER — Other Ambulatory Visit: Payer: Self-pay

## 2018-07-01 MED ORDER — CLOPIDOGREL BISULFATE 75 MG PO TABS: 75.0000 mg | ORAL_TABLET | Freq: Every day | ORAL | 1 refills | Status: DC

## 2018-07-01 MED ORDER — CLOPIDOGREL BISULFATE 75 MG PO TABS: 75.0000 mg | ORAL_TABLET | Freq: Every day | ORAL | 0 refills | Status: DC

## 2018-07-01 NOTE — Telephone Encounter (Signed)
Pt's daughter called back and just wants to make sure that the RX gets sent to [facility] mail order.

## 2018-07-01 NOTE — Telephone Encounter (Signed)
Rx has been sent into pharmacy with corrected directions.

## 2018-07-01 NOTE — Telephone Encounter (Signed)
Please let the patient know that she should be taking the plavix once daily. This has been confirmed by her cardiologist. Please change the prescription in the system to reflect this. Thanks.

## 2018-07-01 NOTE — Telephone Encounter (Signed)
Called and spoke with patient's daughter. Daughter advised and asked to have another Rx sent in to reflect the correct directions as well. Rx sent.

## 2018-07-06 ENCOUNTER — Telehealth: Payer: Self-pay

## 2018-07-06 NOTE — Telephone Encounter (Signed)
Copied from [place] [id number]. Topic: General - Other >> Jul 06, 2018 12:39 PM Percell Belt A wrote: Reason for CRM: Dawn Cooper (daughter) called in and stated that pt has a b12 tomorrow and would like to know if someone could come out to the car and get her the injection ?    Best number [phone]

## 2018-07-06 NOTE — Telephone Encounter (Signed)
Called and spoke to Skene, pt's daughter.  Informed her that someone will come out to pt's car tomorrow to give B-12 injection per pt's request.

## 2018-07-07 ENCOUNTER — Ambulatory Visit (INDEPENDENT_AMBULATORY_CARE_PROVIDER_SITE_OTHER): Payer: Medicare Other

## 2018-07-07 ENCOUNTER — Other Ambulatory Visit: Payer: Self-pay

## 2018-07-07 DIAGNOSIS — E538 Deficiency of other specified B group vitamins: Secondary | ICD-10-CM

## 2018-07-07 MED ORDER — CYANOCOBALAMIN 1000 MCG/ML IJ SOLN
1000.0000 ug | Freq: Once | INTRAMUSCULAR | Status: AC
  Administered 2018-07-07: 1000 ug via INTRAMUSCULAR

## 2018-07-07 NOTE — Progress Notes (Signed)
Pt presented for B-12 injection today.  Administered IM in left deltoid.  Patient tolerated well.

## 2018-07-30 ENCOUNTER — Other Ambulatory Visit: Payer: Self-pay | Admitting: Cardiovascular Disease

## 2018-07-30 DIAGNOSIS — R609 Edema, unspecified: Secondary | ICD-10-CM

## 2018-08-11 ENCOUNTER — Ambulatory Visit (INDEPENDENT_AMBULATORY_CARE_PROVIDER_SITE_OTHER): Payer: Medicare Other

## 2018-08-11 ENCOUNTER — Other Ambulatory Visit: Payer: Self-pay

## 2018-08-11 DIAGNOSIS — E538 Deficiency of other specified B group vitamins: Secondary | ICD-10-CM

## 2018-08-11 MED ORDER — CYANOCOBALAMIN 1000 MCG/ML IJ SOLN
1000.0000 ug | Freq: Once | INTRAMUSCULAR | Status: AC
  Administered 2018-08-11: 1000 ug via INTRAMUSCULAR

## 2018-08-11 NOTE — Progress Notes (Signed)
Patient presented today for B12 injection.  Administered IM in right deltoid.  Patient tolerated well.  Voiced no concerns.

## 2018-09-14 ENCOUNTER — Other Ambulatory Visit: Payer: Self-pay | Admitting: Family Medicine

## 2018-09-15 ENCOUNTER — Other Ambulatory Visit: Payer: Self-pay

## 2018-09-15 ENCOUNTER — Ambulatory Visit (INDEPENDENT_AMBULATORY_CARE_PROVIDER_SITE_OTHER): Payer: Medicare Other

## 2018-09-15 DIAGNOSIS — E538 Deficiency of other specified B group vitamins: Secondary | ICD-10-CM

## 2018-09-15 MED ORDER — CYANOCOBALAMIN 1000 MCG/ML IJ SOLN
1000.0000 ug | Freq: Once | INTRAMUSCULAR | Status: AC
  Administered 2018-09-15: 1000 ug via INTRAMUSCULAR

## 2018-09-15 NOTE — Progress Notes (Signed)
Patient presented today for B12 injection.  Administered IM in the left deltoid.  Administered without incident.  Patient tolerated well with no signs of distress.

## 2018-10-02 ENCOUNTER — Other Ambulatory Visit: Payer: Self-pay | Admitting: Cardiovascular Disease

## 2018-10-02 DIAGNOSIS — R609 Edema, unspecified: Secondary | ICD-10-CM

## 2018-10-15 ENCOUNTER — Ambulatory Visit (INDEPENDENT_AMBULATORY_CARE_PROVIDER_SITE_OTHER): Payer: Medicare Other

## 2018-10-15 ENCOUNTER — Other Ambulatory Visit: Payer: Self-pay

## 2018-10-15 DIAGNOSIS — E538 Deficiency of other specified B group vitamins: Secondary | ICD-10-CM | POA: Diagnosis not present

## 2018-10-15 MED ORDER — CYANOCOBALAMIN 1000 MCG/ML IJ SOLN
1000.0000 ug | Freq: Once | INTRAMUSCULAR | Status: AC
  Administered 2018-10-15: 1000 ug via INTRAMUSCULAR

## 2018-10-15 NOTE — Progress Notes (Signed)
Patient presented today for B12 injection.  Administered IM in the right deltoid.  Patient tolerated well with no signs of distress.

## 2018-11-18 ENCOUNTER — Other Ambulatory Visit: Payer: Self-pay

## 2018-11-18 ENCOUNTER — Ambulatory Visit: Payer: Medicare Other

## 2018-11-18 DIAGNOSIS — Z23 Encounter for immunization: Secondary | ICD-10-CM

## 2018-11-18 DIAGNOSIS — E538 Deficiency of other specified B group vitamins: Secondary | ICD-10-CM

## 2018-11-18 MED ORDER — CYANOCOBALAMIN 1000 MCG/ML IJ SOLN
1000.0000 ug | Freq: Once | INTRAMUSCULAR | Status: AC
  Administered 2018-11-18: 1000 ug via INTRAMUSCULAR

## 2018-11-18 NOTE — Progress Notes (Unsigned)
Patient presented today for B12 injection.  Administered IM in left deltoid.  Patient also presented for flu shot.  Administered IM in right deltoid.  Patient tolerated well with no signs of distress.

## 2018-12-16 ENCOUNTER — Other Ambulatory Visit: Payer: Self-pay | Admitting: Cardiovascular Disease

## 2018-12-16 DIAGNOSIS — R609 Edema, unspecified: Secondary | ICD-10-CM

## 2018-12-23 ENCOUNTER — Ambulatory Visit (INDEPENDENT_AMBULATORY_CARE_PROVIDER_SITE_OTHER): Payer: Medicare Other | Admitting: *Deleted

## 2018-12-23 ENCOUNTER — Other Ambulatory Visit: Payer: Self-pay

## 2018-12-23 DIAGNOSIS — E538 Deficiency of other specified B group vitamins: Secondary | ICD-10-CM

## 2018-12-23 MED ORDER — CYANOCOBALAMIN 1000 MCG/ML IJ SOLN
1000.0000 ug | Freq: Once | INTRAMUSCULAR | Status: AC
  Administered 2018-12-23: 16:00:00 1000 ug via INTRAMUSCULAR

## 2018-12-23 NOTE — Progress Notes (Signed)
Patient presented for B 12 injection to right deltoid, patient voiced no concerns nor showed any signs of distress during injection. 

## 2019-01-11 ENCOUNTER — Other Ambulatory Visit: Payer: Self-pay | Admitting: Family Medicine

## 2019-01-12 ENCOUNTER — Telehealth: Payer: Self-pay

## 2019-01-12 NOTE — Patient Outreach (Signed)
[name] daughter of Ms. Tana Conch answered the Join [facility] call and would like some more information and will call us back if they are interested in joining [employer].

## 2019-01-12 NOTE — Telephone Encounter (Signed)
Refill sent to pharmacy. Please call her to get her scheduled for follow-up. Thanks.

## 2019-01-12 NOTE — Telephone Encounter (Signed)
December is fine

## 2019-01-12 NOTE — Telephone Encounter (Signed)
Patient has a appt with you in December do you still want a follow up.  Oceane Fosse,cma

## 2019-01-17 NOTE — Progress Notes (Signed)
Cardiology Office Note  Date:  01/19/2019   ID:  Dawn Cooper, DOB 06-May-1918, MRN [id number]  PCP:  Dawn Cooper   Chief Complaint  Patient presents with  . Other    Past due 12 month follow up. Patient c.o some SOB. Meds reviewed verbally with patient.     HPI:  Dawn Cooper is a very pleasant 85 year old woman with  severe aortic valve stenosis,   atrial fibrillation (not on anticoagulation secondary to GI bleed) ,  GI bleed from AVM,  stroke June 2016, documented on MRI,  Severe aortic valve stenosis who presents for follow-up of her aortic valve stenosis , chronic atrial fibrillation, hx of STEMI  Periodic SOB More SOB getting in and out of the bed  "weak in the knees" Spending most of her time in the house Does minimal walking  Chronic fatigue No regular exercise program, walks with a walker Denies any chest pain concerning for angina  no near syncope or syncope. No recent TIA or stroke symptoms  Lives with daughter   no leg swelling Takes fluid pill QOD  Echo 10/2017 - Left ventricle: The cavity size was normal. There was mild   concentric hypertrophy. Systolic function was severely reduced.   The estimated ejection fraction was in the range of 25% to 30%.   Hypokinesis of the anterior myocardium. Hypokinesis of the   anteroseptal myocardium. Hypokinesis of the apical myocardium.   The study is not technically sufficient to allow evaluation of LV   diastolic function. - Aortic valve: There was severe stenosis. There was mild   regurgitation. Peak velocity (S): 417 cm/s. Mean gradient (S): 40   mm Hg. Valve area (VTI): 0.35 cm^2. - Mitral valve: There was mild to moderate regurgitation. - Left atrium: The atrium was severely dilated.   Long discussion concerning her aortic valve Previous echocardiogram May 2018 with moderate to severe aortic valve stenosis  EKG personally reviewed by myself on todays visit Shows atrial fibrillation with  ventricular rate 63 bpm left bundle branch block No change from previous EKGs  Other past medical history reviewed STEMI 08/09/2016  Ost 1st Diag lesion, 70 %stenosed.  A STENT SYNERGY DES 2.5X16 drug eluting stent was successfully placed.  Prox LAD lesion, 100 %stenosed.  Post intervention, there is a 0% residual stenosis.  1. Acute anterolateral STEMI secondary to thrombotic occlusion of known severe mid LAD and diagonal stenosis.  Successful PTCA/DES x 1 mid LAD  Echocardiogram September 23, 2016 LV function remains severely depressed with a large anterolateral/apical wall motion abnormality. The anteroseptal wall and apex are akinetic  Echo 08/10/2016 EF 25 to 30%    PMH:   has a past medical history of A-fib ([facility]), Broken leg, Cardiomyopathy, Chronic kidney disease, GERD (gastroesophageal reflux disease), Heart murmur, Hiatal hernia, Hypertension, Mild mitral regurgitation by prior echocardiogram, Moderate aortic stenosis, Myocardial infarction Dawn Cooper), Stroke (Dawn Cooper), and Tricuspid regurgitation.  PSH:    Past Surgical History:  Procedure Laterality Date  . APPENDECTOMY    . CATARACT EXTRACTION  2013   left  . CHOLECYSTECTOMY    . CORONARY ANGIOGRAPHY N/A 08/09/2016   Procedure: Coronary Angiography;  Surgeon: Burnell Blanks, [facility];  Location: [facility];  Service: Cardiovascular;  Laterality: N/A;  . CORONARY ANGIOPLASTY    . CORONARY ARTERY BYPASS GRAFT    . CORONARY STENT INTERVENTION N/A 08/09/2016   Procedure: Coronary Stent Intervention;  Surgeon: Burnell Blanks, [facility];  Location: [facility];  Service:  Cardiovascular;  Laterality: N/A;  . ERCP N/A 10/07/2017   Procedure: ENDOSCOPIC RETROGRADE CHOLANGIOPANCREATOGRAPHY (ERCP);  Surgeon: Lucilla Lame, [facility];  Location: [facility];  Service: [facility];  Laterality: N/A;  . ESOPHAGOGASTRODUODENOSCOPY (EGD) WITH PROPOFOL N/A 10/08/2017   Procedure: ESOPHAGOGASTRODUODENOSCOPY (EGD) WITH PROPOFOL;   Surgeon: Lucilla Lame, [facility];  Location: [facility];  Service: [facility];  Laterality: N/A;  . EYE SURGERY    . FEMUR FRACTURE SURGERY  12/2011   Dr. Sabra Heck, s/p rehab at [facility]  . FRACTURE SURGERY    . GALLBLADDER SURGERY    . IR RADIOLOGY PERIPHERAL GUIDED IV START  07/29/2016  . IR US GUIDE VASC ACCESS RIGHT  07/29/2016  . VAGINAL DELIVERY      Current Outpatient Medications  Medication Sig Dispense Refill  . clopidogrel (PLAVIX) 75 MG tablet TAKE 1 TABLET EVERY DAY 90 tablet 1  . furosemide (LASIX) 20 MG tablet TAKE 1 TABLET EVERY OTHER DAY. 45 tablet 0  . metoprolol tartrate (LOPRESSOR) 25 MG tablet TAKE 1/2 TABLET TWICE DAILY 90 tablet 3  . nitroGLYCERIN (NITROSTAT) 0.4 MG SL tablet Place 1 tablet (0.4 mg total) under the tongue every 5 (five) minutes x 3 doses as needed for chest pain. 25 tablet 12  . pantoprazole (PROTONIX) 40 MG tablet TAKE 1 TABLET EVERY DAY 90 tablet 1  . potassium chloride (K-DUR) 10 MEQ tablet TAKE 1 TABLET EVERY OTHER DAY 45 tablet 3   No current facility-administered medications for this visit.      Allergies:   Dabigatran, Dabigatran etexilate mesylate, Clonidine, Clonidine derivatives, and Penicillins   Social History:  The patient  reports that she has never smoked. She has never used smokeless tobacco. She reports that she does not drink alcohol or use drugs.   Family History:   family history includes Cancer in her brother and sister; Diabetes in her brother and another family member; Stroke in her sister.    Review of Systems: Review of Systems  Constitutional: Positive for malaise/fatigue.  Respiratory: Positive for shortness of breath.   Cardiovascular: Negative.   Gastrointestinal: Negative.   Musculoskeletal: Negative.        Leg weakness  Neurological: Negative.   Psychiatric/Behavioral: Negative.   All other systems reviewed and are negative.    PHYSICAL EXAM: VS:  BP 130/68 (BP Location: Left Arm, Patient Position: Sitting,  Cuff Size: Normal)   Pulse 63   Ht 5' (1.524 m)   Wt 136 lb (61.7 kg)   BMI 26.56 kg/m  , BMI Body mass index is 26.56 kg/m. Constitutional:  oriented to person, place, and time. No distress. weak HENT:  Head: Grossly normal Eyes:  no discharge. No scleral icterus.  Neck: No JVD, no carotid bruits  Cardiovascular: Regular rate and rhythm, no murmurs appreciated Pulmonary/Chest: Clear to auscultation bilaterally, no wheezes or rails Abdominal: Soft.  no distension.  no tenderness.  Musculoskeletal: Normal range of motion Neurological:  normal muscle tone. Coordination normal. No atrophy Skin: Skin warm and dry Psychiatric: normal affect, pleasant   Recent Labs: 03/20/2018: ALT 8; BUN 17; Creatinine, Ser 1.54; Hemoglobin 12.4; Platelets 179.0; Potassium 4.2; Sodium 141    Lipid Panel [facility] Results  Component Value Date   CHOL 154 08/09/2016   HDL 33 (L) 08/09/2016   LDLCALC 102 (H) 08/09/2016   TRIG 94 08/09/2016      Wt Readings from Last 3 Encounters:  01/19/19 136 lb (61.7 kg)  03/20/18 139 lb 3.2 oz (63.1 kg)  03/20/18 139 lb 3.2 oz (  63.1 kg)       ASSESSMENT AND PLAN:  Chronic atrial fibrillation (HCC) -   history of strokes.    she has declined warfarin or other more aggressive anticoagulation for atrial fibrillation. Hx of GI bleed High fall risk  Cerebrovascular accident (CVA) due to embolism of precerebral artery ([place])  Documented on MRI.  High fall risk and patient has declined anticoagulation On plavix  Essential hypertension - Plan: EKG 12-Lead, ECHOCARDIOGRAM COMPLETE Blood pressure is well controlled on today's visit. No changes made to the medications.  Aortic valve stenosis, severe -  Currently asymptomatic, medical management per her choice Very frail  CKD (chronic kidney disease), stage III  stable renal function, CR 1.5 Avoid NSAIDs  STEMI Mid LAD stent, recovered well On Plavix  GI bleed history No angina   Total encounter time  more than 25 minutes  Greater than 50% was spent in counseling and coordination of care with the patient   Disposition:   F/U  6 - 9 months   No orders of the defined types were placed in this encounter.    Signed, Esmond Plants, M.D., Ph.D. 01/19/2019  [place], [place]

## 2019-01-19 ENCOUNTER — Encounter: Payer: Self-pay | Admitting: Cardiovascular Disease

## 2019-01-19 ENCOUNTER — Other Ambulatory Visit: Payer: Self-pay

## 2019-01-19 ENCOUNTER — Ambulatory Visit (INDEPENDENT_AMBULATORY_CARE_PROVIDER_SITE_OTHER): Payer: Medicare Other | Admitting: Cardiovascular Disease

## 2019-01-19 VITALS — BP 130/68 | HR 63 | Ht 60.0 in | Wt 136.0 lb

## 2019-01-19 DIAGNOSIS — I482 Chronic atrial fibrillation, unspecified: Secondary | ICD-10-CM | POA: Diagnosis not present

## 2019-01-19 DIAGNOSIS — N1831 Chronic kidney disease, stage 3a: Secondary | ICD-10-CM

## 2019-01-19 DIAGNOSIS — I25118 Atherosclerotic heart disease of native coronary artery with other forms of angina pectoris: Secondary | ICD-10-CM

## 2019-01-19 DIAGNOSIS — I350 Nonrheumatic aortic (valve) stenosis: Secondary | ICD-10-CM

## 2019-01-19 NOTE — Patient Instructions (Signed)
Medication Instructions:  No changes  If you need a refill on your cardiac medications before your next appointment, please call your pharmacy.    Lab work: No new labs needed   If you have labs (blood work) drawn today and your tests are completely normal, you will receive your results only by: Marland Kitchen MyChart Message (if you have MyChart) OR . A paper copy in the mail If you have any lab test that is abnormal or we need to change your treatment, we will call you to review the results.   Testing/Procedures: No new testing needed   Follow-Up: At [facility], you and your health needs are our priority.  As part of our continuing mission to provide you with exceptional heart care, we have created designated Provider Care Teams.  These Care Teams include your primary Cardiologist (physician) and Advanced Practice Providers (APPs -  Physician Assistants and Nurse Practitioners) who all work together to provide you with the care you need, when you need it.  . You will need a follow up appointment in 6 to 9 months .   Please call our office 2 months in advance to schedule this appointment.    . Providers on your designated Care Team:   . Murray Hodgkins, NP . Christell Faith, PA-C . Marrianne Mood, PA-C  Any Other Special Instructions Will Be Listed Below (If Applicable).  For educational health videos Log in to : [email or web address] Or : [email or web address], password : [email or web address]

## 2019-01-21 ENCOUNTER — Other Ambulatory Visit: Payer: Self-pay

## 2019-01-25 ENCOUNTER — Ambulatory Visit: Payer: Medicare Other

## 2019-01-25 NOTE — Telephone Encounter (Signed)
Copied from [place] [id number]. Topic: Appointment Scheduling - Scheduling Inquiry for Clinic >> Jan 25, 2019 11:15 AM Reyne Dumas L wrote: Reason for CRM:  Pt's daughter calling - states that they have been confused with time change and have missed pt's B12 appointment today.  They would like to know if pt can still be seen today for injection.  Tried office, no answer.

## 2019-01-25 NOTE — Telephone Encounter (Signed)
Scheduled for tomorrow.

## 2019-01-26 ENCOUNTER — Ambulatory Visit (INDEPENDENT_AMBULATORY_CARE_PROVIDER_SITE_OTHER): Payer: Medicare Other

## 2019-01-26 ENCOUNTER — Other Ambulatory Visit: Payer: Self-pay

## 2019-01-26 DIAGNOSIS — E538 Deficiency of other specified B group vitamins: Secondary | ICD-10-CM

## 2019-01-26 MED ORDER — CYANOCOBALAMIN 1000 MCG/ML IJ SOLN
1000.0000 ug | Freq: Once | INTRAMUSCULAR | Status: AC
  Administered 2019-01-26: 11:00:00 1000 ug via INTRAMUSCULAR

## 2019-01-26 MED ORDER — NITROGLYCERIN 0.4 MG SL SUBL: 0.4000 mg | SUBLINGUAL_TABLET | SUBLINGUAL | 3 refills | Status: AC | PRN

## 2019-01-26 NOTE — Progress Notes (Signed)
Patient presented today for B12 injection.  Administered IM in the left deltoid.  Patient tolerated well with no signs of distress.   

## 2019-01-26 NOTE — Telephone Encounter (Signed)
*  STAT* If patient is at the pharmacy, call can be transferred to refill team.   1. Which medications need to be refilled? (please list name of each medication and dose if known) Nitroglycerin  2. Which pharmacy/location (including street and city if local pharmacy) is medication to be sent to? [facility]  3. Do they need a 30 day or 90 day supply?

## 2019-02-04 ENCOUNTER — Other Ambulatory Visit: Payer: Self-pay | Admitting: Cardiovascular Disease

## 2019-02-22 ENCOUNTER — Other Ambulatory Visit: Payer: Self-pay | Admitting: Cardiovascular Disease

## 2019-02-22 DIAGNOSIS — R609 Edema, unspecified: Secondary | ICD-10-CM

## 2019-02-25 ENCOUNTER — Ambulatory Visit (INDEPENDENT_AMBULATORY_CARE_PROVIDER_SITE_OTHER): Payer: Medicare Other

## 2019-02-25 ENCOUNTER — Other Ambulatory Visit: Payer: Self-pay

## 2019-02-25 DIAGNOSIS — E538 Deficiency of other specified B group vitamins: Secondary | ICD-10-CM | POA: Diagnosis not present

## 2019-02-25 MED ORDER — CYANOCOBALAMIN 1000 MCG/ML IJ SOLN
1000.0000 ug | Freq: Once | INTRAMUSCULAR | Status: AC
  Administered 2019-02-25: 1000 ug via INTRAMUSCULAR

## 2019-03-10 ENCOUNTER — Other Ambulatory Visit: Payer: Self-pay | Admitting: Family Medicine

## 2019-03-21 NOTE — Progress Notes (Signed)
  I have reviewed the above information and agree with above.   Letricia Krinsky, MD 

## 2019-03-23 ENCOUNTER — Other Ambulatory Visit: Payer: Self-pay

## 2019-03-23 ENCOUNTER — Ambulatory Visit (INDEPENDENT_AMBULATORY_CARE_PROVIDER_SITE_OTHER): Payer: Medicare Other

## 2019-03-23 VITALS — Ht 60.0 in | Wt 136.0 lb

## 2019-03-23 DIAGNOSIS — Z0000 Encounter for general adult medical examination without abnormal findings: Secondary | ICD-10-CM | POA: Diagnosis not present

## 2019-03-23 NOTE — Patient Instructions (Addendum)
  Dawn Cooper , Thank you for taking time to come for your Medicare Wellness Visit. I appreciate your ongoing commitment to your health goals. Please review the following plan we discussed and let me know if I can assist you in the future.   These are the goals we discussed: Goals    . Maintain healthy lifestyle       This is a list of the screening recommended for you and due dates:  Health Maintenance  Topic Date Due  . DEXA scan (bone density measurement)  03/22/2020*  . Tetanus Vaccine  03/20/2028  . Flu Shot  Completed  . Pneumonia vaccines  Completed  *Topic was postponed. The date shown is not the original due date.

## 2019-03-23 NOTE — Progress Notes (Signed)
Subjective:   Dawn Cooper is a 85 y.o. female who presents for Medicare Annual (Subsequent) preventive examination.  Review of Systems:  No ROS.  Medicare Wellness Virtual Visit.  Visual/audio telehealth visit, UTA vital signs.   Wt/Ht provided by patient.  See social history for additional risk factors.   Cardiac Risk Factors include: advanced age (>35men, >57 women)     Objective:     Vitals: Ht 5' (1.524 m)   Wt 136 lb (61.7 kg)   BMI 26.56 kg/m   Body mass index is 26.56 kg/m.  Advanced Directives 03/23/2019 03/20/2018 10/08/2017 10/07/2017 02/19/2017 08/11/2016 08/09/2016  Does Patient Have a Medical Advance Directive? No No No No No No No  Type of Advance Directive - - - - - - -  Copy of Healthcare Power of Attorney in Chart? - - - - - - -  Would patient like information on creating a medical advance directive? Yes (MAU/Ambulatory/Procedural Areas - Information given) Yes (MAU/Ambulatory/Procedural Areas - Information given) No - Patient declined - Yes (MAU/Ambulatory/Procedural Areas - Information given) No - Patient declined -    Tobacco Social History   Tobacco Use  Smoking Status Never Smoker  Smokeless Tobacco Never Used     Counseling given: Not Answered   Clinical Intake:  Pre-visit preparation completed: Yes        Diabetes: No  How often do you need to have someone help you when you read instructions, pamphlets, or other written materials from your doctor or pharmacy?: 3 - Sometimes  Interpreter Needed?: No     Past Medical History:  Diagnosis Date  . A-fib ([place])   . Broken leg    right  . Cardiomyopathy   . Chronic kidney disease   . GERD (gastroesophageal reflux disease)   . Heart murmur   . Hiatal hernia   . Hypertension   . Mild mitral regurgitation by prior echocardiogram   . Moderate aortic stenosis   . Myocardial infarction ([place])   . Stroke ([place])   . Tricuspid regurgitation    Past Surgical History:  Procedure  Laterality Date  . APPENDECTOMY    . CATARACT EXTRACTION  2013   left  . CHOLECYSTECTOMY    . CORONARY ANGIOGRAPHY N/A 08/09/2016   Procedure: Coronary Angiography;  Surgeon: Burnell Blanks, MD;  Location: [facility];  Service: Cardiovascular;  Laterality: N/A;  . CORONARY ANGIOPLASTY    . CORONARY ARTERY BYPASS GRAFT    . CORONARY STENT INTERVENTION N/A 08/09/2016   Procedure: Coronary Stent Intervention;  Surgeon: Burnell Blanks, MD;  Location: [facility];  Service: Cardiovascular;  Laterality: N/A;  . ERCP N/A 10/07/2017   Procedure: ENDOSCOPIC RETROGRADE CHOLANGIOPANCREATOGRAPHY (ERCP);  Surgeon: Lucilla Lame, MD;  Location: [facility] ENDOSCOPY;  Service: Endoscopy;  Laterality: N/A;  . ESOPHAGOGASTRODUODENOSCOPY (EGD) WITH PROPOFOL N/A 10/08/2017   Procedure: ESOPHAGOGASTRODUODENOSCOPY (EGD) WITH PROPOFOL;  Surgeon: Lucilla Lame, MD;  Location: [facility] ENDOSCOPY;  Service: Endoscopy;  Laterality: N/A;  . EYE SURGERY    . FEMUR FRACTURE SURGERY  12/2011   Dr. Sabra Heck, s/p rehab at [facility]  . FRACTURE SURGERY    . GALLBLADDER SURGERY    . IR RADIOLOGY PERIPHERAL GUIDED IV START  07/29/2016  . IR US GUIDE VASC ACCESS RIGHT  07/29/2016  . VAGINAL DELIVERY     Family History  Problem Relation Age of Onset  . Stroke Sister   . Cancer Brother        pancreatic  .  Cancer Sister        pancreatic  . Diabetes Other   . Diabetes Brother   . Heart attack Neg Hx    Social History   Socioeconomic History  . Marital status: Widowed    Spouse name: Not on file  . Number of children: Not on file  . Years of education: Not on file  . Highest education level: Not on file  Occupational History    Comment: Retired  Tobacco Use  . Smoking status: Never Smoker  . Smokeless tobacco: Never Used  Substance and Sexual Activity  . Alcohol use: Never  . Drug use: Never  . Sexual activity: Never  Other Topics Concern  . Not on file  Social History Narrative   Lives in  [place] alone. 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   Social Determinants of Health   Financial Resource Strain: Low Risk   . Difficulty of Paying Living Expenses: Not hard at all  Food Insecurity: No Food Insecurity  . Worried About Charity fundraiser in the Last Year: Never true  . Ran Out of Food in the Last Year: Never true  Transportation Needs: No Transportation Needs  . Lack of Transportation (Medical): No  . Lack of Transportation (Non-Medical): No  Physical Activity: Unknown  . Days of Exercise per Week: 0 days  . Minutes of Exercise per Session: Not on file  Stress: No Stress Concern Present  . Feeling of Stress : Not at all  Social Connections: Unknown  . Frequency of Communication with Friends and Family: More than three times a week  . Frequency of Social Gatherings with Friends and Family: More than three times a week  . Attends Religious Services: Not on file  . Active Member of Clubs or Organizations: Not on file  . Attends Archivist Meetings: Not on file  . Marital Status: Not on file    Outpatient Encounter Medications as of 03/23/2019  Medication Sig  . clopidogrel (PLAVIX) 75 MG tablet TAKE 1 TABLET EVERY DAY  . furosemide (LASIX) 20 MG tablet TAKE 1 TABLET EVERY OTHER DAY.  . metoprolol tartrate (LOPRESSOR) 25 MG tablet TAKE 1/2 TABLET TWICE DAILY  . nitroGLYCERIN (NITROSTAT) 0.4 MG SL tablet Place 1 tablet (0.4 mg total) under the tongue every 5 (five) minutes x 3 doses as needed for chest pain.  . pantoprazole (PROTONIX) 40 MG tablet TAKE 1 TABLET EVERY DAY  . potassium chloride (K-DUR) 10 MEQ tablet TAKE 1 TABLET EVERY OTHER DAY   No facility-administered encounter medications on file as of 03/23/2019.    Activities of Daily Living In your present state of health, do you have any difficulty performing the following activities: 03/23/2019  Hearing? N  Vision? N  Difficulty concentrating or making decisions? N  Walking or climbing stairs? N    Dressing or bathing? N  Doing errands, shopping? Y  Preparing Food and eating ? N  Using the Toilet? N  In the past six months, have you accidently leaked urine? N  Do you have problems with loss of bowel control? N  Managing your Medications? Y  Managing your Finances? Y  Housekeeping or managing your Housekeeping? Y  Some recent data might be hidden    Patient Care Team: Leone Haven, MD as PCP - General (Family Medicine) Minna Merritts, MD as Consulting Physician (Cardiology) Lollie Sails, MD as Physician Assistant (Internal Medicine)    Assessment:   This is a routine wellness examination  for [facility].  Nurse connected with patient 03/23/19 at 11:30 AM EST by a telephone enabled telemedicine application and verified that I am speaking with the correct person using two identifiers. Patient stated full name and DOB. Patient gave permission to continue with virtual visit. Patient's location was at home and Nurse's location was at [place] office.   Patient is alert and oriented x3. Patient denies difficulty focusing or concentrating. Patient likes to read, play word search, complete puzzles for brain stimulation.   Health Maintenance Due: -Dexa Scan- deferred per patient preference.  See completed HM at the end of note.   Eye: Visual acuity not assessed. Virtual visit. Followed by their ophthalmologist.  Dental: UTD.  Hearing: Demonstrates normal hearing during visit.  Safety:  Patient feels safe at home- yes Patient does have smoke detectors at home- yes Patient does wear sunscreen or protective clothing when in direct sunlight - yes Patient does wear seat belt when in a moving vehicle - yes Patient drives- no Adequate lighting in walkways free from debris- yes Grab bars and handrails used as appropriate- yes Ambulates with an assistive device- yes; walker  Social: Alcohol intake - no    Smoking history- never   Smokers in home? none Illicit drug  use? none  Medication: Taking as directed and without issues. Managed by daughter.   Covid-19: Precautions and sickness symptoms discussed. Wears mask, social distancing, hand hygiene as appropriate.   Activities of Daily Living Patient denies needing assistance with: feeding themselves, getting from bed to chair, getting to the toilet, bathing/showering, dressing.  Assisted by daughter Katharine Look with household chores, managing money, and preparing meals.   Discussed the importance of a healthy diet, water intake and the benefits of aerobic exercise.  Physical activity- walking as tolerated.  Diet:  Regular; Boost daily Water: good intake  Other Providers Patient Care Team: Leone Haven, MD as PCP - General (Family Medicine) Rockey Situ Kathlene November, MD as Consulting Physician (Cardiology) Lollie Sails, MD as Physician Assistant (Internal Medicine)  Exercise Activities and Dietary recommendations Current Exercise Habits: Home exercise routine, Intensity: Mild  Goals    . Maintain healthy lifestyle       Fall Risk Fall Risk  03/23/2019 03/20/2018 02/10/2018 02/19/2017 04/01/2016  Falls in the past year? 0 0 0 No No  Comment - - Emmi Telephone Survey: data to providers prior to load - -  Number falls in past yr: - 0 - - -  Injury with Fall? - 0 - - -  Follow up Falls prevention discussed - - - -    Timed Get Up and Go performed: no, virtual visit.  Depression Screen PHQ 2/9 Scores 03/23/2019 03/20/2018 02/19/2017 02/20/2016  PHQ - 2 Score 0 0 0 1  PHQ- 9 Score - - 1 -     Cognitive Function MMSE - Mini Mental State Exam 03/23/2019 03/20/2018  Not completed: Unable to complete Unable to complete     6CIT Screen 02/19/2017 02/20/2016  What Year? 4 points 0 points  What month? 0 points 0 points  What time? 0 points 0 points  Count back from 20 0 points 0 points  Months in reverse 0 points 0 points  Repeat phrase 0 points -  Total Score 4 -    Immunization  History  Administered Date(s) Administered  . Fluad Quad(high Dose 65+) 11/18/2018  . Influenza, High Dose Seasonal PF 12/12/2015, 01/01/2017, 11/26/2017  . Influenza,inj,Quad PF,6+ Mos 02/11/2013, 12/17/2013, 12/20/2014  . Influenza-Unspecified 12/25/2011, 02/11/2013,  12/17/2013, 12/20/2014, 12/12/2015  . Pneumococcal Conjugate-13 03/22/2014  . Pneumococcal Polysaccharide-23 12/25/2011  . Tdap 03/20/2018   Screening Tests Health Maintenance  Topic Date Due  . DEXA SCAN  03/22/2020 (Originally 10/16/1983)  . TETANUS/TDAP  03/20/2028  . INFLUENZA VACCINE  Completed  . PNA vac Low Risk Adult  Completed       Plan:   Keep all routine maintenance appointments.   Follow up 03/24/19 @ 11:00  Nurse visit 03/31/19 @ 11:00  Medicare Attestation I have personally reviewed: The patient's medical and social history Their use of alcohol, tobacco or illicit drugs Their current medications and supplements The patient's functional ability including ADLs,fall risks, home safety risks, cognitive, and hearing and visual impairment Diet and physical activities Evidence for depression   I have reviewed and discussed with patient certain preventive protocols, quality metrics, and best practice recommendations.     Varney Biles, LPN  [id number]

## 2019-03-24 ENCOUNTER — Other Ambulatory Visit: Payer: Self-pay

## 2019-03-24 ENCOUNTER — Ambulatory Visit: Payer: Medicare Other

## 2019-03-24 ENCOUNTER — Ambulatory Visit (INDEPENDENT_AMBULATORY_CARE_PROVIDER_SITE_OTHER): Payer: Medicare Other | Admitting: Family Medicine

## 2019-03-24 ENCOUNTER — Encounter: Payer: Self-pay | Admitting: Family Medicine

## 2019-03-24 DIAGNOSIS — E538 Deficiency of other specified B group vitamins: Secondary | ICD-10-CM

## 2019-03-24 DIAGNOSIS — I10 Essential (primary) hypertension: Secondary | ICD-10-CM | POA: Diagnosis not present

## 2019-03-24 DIAGNOSIS — I4891 Unspecified atrial fibrillation: Secondary | ICD-10-CM

## 2019-03-24 DIAGNOSIS — I25118 Atherosclerotic heart disease of native coronary artery with other forms of angina pectoris: Secondary | ICD-10-CM | POA: Diagnosis not present

## 2019-03-24 DIAGNOSIS — Z8673 Personal history of transient ischemic attack (TIA), and cerebral infarction without residual deficits: Secondary | ICD-10-CM

## 2019-03-24 DIAGNOSIS — F0390 Unspecified dementia without behavioral disturbance: Secondary | ICD-10-CM | POA: Diagnosis not present

## 2019-03-24 NOTE — Assessment & Plan Note (Signed)
Well-controlled.  Continue metoprolol and Lasix.

## 2019-03-24 NOTE — Assessment & Plan Note (Signed)
Continue Plavix.  ?

## 2019-03-24 NOTE — Assessment & Plan Note (Signed)
Asymptomatic.  Continue current regimen. 

## 2019-03-24 NOTE — Assessment & Plan Note (Signed)
Stable.  She will monitor at this time.

## 2019-03-24 NOTE — Assessment & Plan Note (Signed)
Continue B12 injections.   

## 2019-03-24 NOTE — Assessment & Plan Note (Signed)
Stable  Continue current regimen  

## 2019-03-24 NOTE — Progress Notes (Signed)
Virtual Visit via telephone Note  This visit type was conducted due to national recommendations for restrictions regarding the COVID-19 pandemic (e.g. social distancing).  This format is felt to be most appropriate for this patient at this time.  All issues noted in this document were discussed and addressed.  No physical exam was performed (except for noted visual exam findings with Video Visits).   I connected with Dawn Cooper today at 11:00 AM EST by telephone and verified that I am speaking with the correct person using two identifiers. Location patient: home Location provider: work Persons participating in the virtual visit: patient, provider, Lucilla Edin (daughter)  I discussed the limitations, risks, security and privacy concerns of performing an evaluation and management service by telephone and the availability of in person appointments. I also discussed with the patient that there may be a patient responsible charge related to this service. The patient expressed understanding and agreed to proceed.  Interactive audio and video telecommunications were attempted between this provider and patient, however failed, due to patient having technical difficulties OR patient did not have access to video capability.  We continued and completed visit with audio only.   Reason for visit: follow-up  HPI: CAD/Afib: Currently taking Plavix, metoprolol, and Lasix.  No melena or blood in her stool.  No chest pain.  No shortness of breath.  No edema.  Dementia: Patient notes her memory is stable.  She cannot remember short-term things though has no issues with long-term things.  No issues with ADLs.  She no longer drives.  History of stroke: Patient has had no numbness or weakness.  She is currently on Plavix.   ROS: See pertinent positives and negatives per HPI.  Past Medical History:  Diagnosis Date  . A-fib ([place])   . Broken leg    right  . Cardiomyopathy   . Chronic kidney disease     . Common bile duct stone   . Duodenal ulceration   . Gastrointestinal hemorrhage with hematemesis   . GERD (gastroesophageal reflux disease)   . Heart murmur   . Hiatal hernia   . Hypertension   . Mild mitral regurgitation by prior echocardiogram   . Moderate aortic stenosis   . Myocardial infarction ([place])   . Stroke ([place])   . Tricuspid regurgitation     Past Surgical History:  Procedure Laterality Date  . APPENDECTOMY    . CATARACT EXTRACTION  2013   left  . CHOLECYSTECTOMY    . CORONARY ANGIOGRAPHY N/A 08/09/2016   Procedure: Coronary Angiography;  Surgeon: Burnell Blanks, MD;  Location: [facility];  Service: Cardiovascular;  Laterality: N/A;  . CORONARY ANGIOPLASTY    . CORONARY ARTERY BYPASS GRAFT    . CORONARY STENT INTERVENTION N/A 08/09/2016   Procedure: Coronary Stent Intervention;  Surgeon: Burnell Blanks, MD;  Location: [facility];  Service: Cardiovascular;  Laterality: N/A;  . ERCP N/A 10/07/2017   Procedure: ENDOSCOPIC RETROGRADE CHOLANGIOPANCREATOGRAPHY (ERCP);  Surgeon: Lucilla Lame, MD;  Location: [facility] ENDOSCOPY;  Service: Endoscopy;  Laterality: N/A;  . ESOPHAGOGASTRODUODENOSCOPY (EGD) WITH PROPOFOL N/A 10/08/2017   Procedure: ESOPHAGOGASTRODUODENOSCOPY (EGD) WITH PROPOFOL;  Surgeon: Lucilla Lame, MD;  Location: [facility] ENDOSCOPY;  Service: Endoscopy;  Laterality: N/A;  . EYE SURGERY    . FEMUR FRACTURE SURGERY  12/2011   Dr. Sabra Heck, s/p rehab at [facility]  . FRACTURE SURGERY    . GALLBLADDER SURGERY    . IR RADIOLOGY PERIPHERAL GUIDED IV START  07/29/2016  .  IR US GUIDE VASC ACCESS RIGHT  07/29/2016  . VAGINAL DELIVERY      Family History  Problem Relation Age of Onset  . Stroke Sister   . Cancer Brother        pancreatic  . Cancer Sister        pancreatic  . Diabetes Other   . Diabetes Brother   . Heart attack Neg Hx     SOCIAL HX: nonsmoker   Current Outpatient Medications:  .  clopidogrel (PLAVIX) 75 MG tablet, TAKE 1  TABLET EVERY DAY, Disp: 90 tablet, Rfl: 1 .  furosemide (LASIX) 20 MG tablet, TAKE 1 TABLET EVERY OTHER DAY., Disp: 45 tablet, Rfl: 1 .  metoprolol tartrate (LOPRESSOR) 25 MG tablet, TAKE 1/2 TABLET TWICE DAILY, Disp: 90 tablet, Rfl: 0 .  nitroGLYCERIN (NITROSTAT) 0.4 MG SL tablet, Place 1 tablet (0.4 mg total) under the tongue every 5 (five) minutes x 3 doses as needed for chest pain., Disp: 25 tablet, Rfl: 3 .  pantoprazole (PROTONIX) 40 MG tablet, TAKE 1 TABLET EVERY DAY, Disp: 90 tablet, Rfl: 1 .  potassium chloride (K-DUR) 10 MEQ tablet, TAKE 1 TABLET EVERY OTHER DAY, Disp: 45 tablet, Rfl: 3  EXAM:  VITALS per patient if applicable: BP [id number], P 66   ASSESSMENT AND PLAN:  Discussed the following assessment and plan:  Atrial fibrillation (HCC) Stable.  Continue current regimen.  Coronary artery disease of native artery of native heart with stable angina pectoris (HCC) Asymptomatic.  Continue current regimen.  HTN (hypertension) Well-controlled.  Continue metoprolol and Lasix.  Dementia without behavioral disturbance Stable.  She will monitor at this time.  B12 deficiency Continue B12 injections.  History of stroke Continue Plavix.    I discussed the assessment and treatment plan with the patient. The patient was provided an opportunity to ask questions and all were answered. The patient agreed with the plan and demonstrated an understanding of the instructions.   The patient was advised to call back or seek an in-person evaluation if the symptoms worsen or if the condition fails to improve as anticipated.  I provided 9 minutes of non-face-to-face time during this encounter.   Tommi Rumps, MD

## 2019-03-31 ENCOUNTER — Other Ambulatory Visit: Payer: Self-pay

## 2019-03-31 ENCOUNTER — Other Ambulatory Visit (INDEPENDENT_AMBULATORY_CARE_PROVIDER_SITE_OTHER): Payer: Medicare Other

## 2019-03-31 ENCOUNTER — Ambulatory Visit (INDEPENDENT_AMBULATORY_CARE_PROVIDER_SITE_OTHER): Payer: Medicare Other

## 2019-03-31 VITALS — Temp 95.2°F

## 2019-03-31 DIAGNOSIS — I10 Essential (primary) hypertension: Secondary | ICD-10-CM

## 2019-03-31 DIAGNOSIS — E538 Deficiency of other specified B group vitamins: Secondary | ICD-10-CM

## 2019-03-31 DIAGNOSIS — I4891 Unspecified atrial fibrillation: Secondary | ICD-10-CM | POA: Diagnosis not present

## 2019-03-31 LAB — COMPREHENSIVE METABOLIC PANEL
ALT: 9 U/L (ref 0–35)
AST: 22 U/L (ref 0–37)
Albumin: 3.5 g/dL (ref 3.5–5.2)
Alkaline Phosphatase: 68 U/L (ref 39–117)
BUN: 20 mg/dL (ref 6–23)
CO2: 26 mEq/L (ref 19–32)
Calcium: 9.7 mg/dL (ref 8.4–10.5)
Chloride: 108 mEq/L (ref 96–112)
Creatinine, Ser: 1.53 mg/dL — ABNORMAL HIGH (ref 0.40–1.20)
GFR: 31.31 mL/min — ABNORMAL LOW (ref >60.00)
Glucose, Bld: 110 mg/dL — ABNORMAL HIGH (ref 70–99)
Potassium: 3.7 mEq/L (ref 3.5–5.1)
Sodium: 142 mEq/L (ref 135–145)
Total Bilirubin: 0.6 mg/dL (ref 0.2–1.2)
Total Protein: 6.7 g/dL (ref 6.0–8.3)

## 2019-03-31 LAB — CBC
HCT: 35.6 % — ABNORMAL LOW (ref 36.0–46.0)
Hemoglobin: 12.2 g/dL (ref 12.0–15.0)
MCHC: 34.2 g/dL (ref 30.0–36.0)
MCV: 95 fl (ref 78.0–100.0)
Platelets: 172 10*3/uL (ref 150.0–400.0)
RBC: 3.75 Mil/uL — ABNORMAL LOW (ref 3.87–5.11)
RDW: 14.1 % (ref 11.5–15.5)
WBC: 6.6 10*3/uL (ref 4.0–10.5)

## 2019-03-31 MED ORDER — CYANOCOBALAMIN 1000 MCG/ML IJ SOLN
1000.0000 ug | Freq: Once | INTRAMUSCULAR | Status: AC
  Administered 2019-03-31: 1000 ug via INTRAMUSCULAR

## 2019-03-31 NOTE — Progress Notes (Signed)
I have reviewed the above note and agree.  Erinn Mendosa, M.D.  

## 2019-03-31 NOTE — Progress Notes (Signed)
Patient came in today for B-12 injection in left deltoid. Patient tolerated well.

## 2019-04-12 ENCOUNTER — Other Ambulatory Visit: Payer: Self-pay | Admitting: Cardiovascular Disease

## 2019-04-27 ENCOUNTER — Other Ambulatory Visit: Payer: Self-pay

## 2019-05-04 ENCOUNTER — Ambulatory Visit (INDEPENDENT_AMBULATORY_CARE_PROVIDER_SITE_OTHER): Payer: Medicare Other | Admitting: Lab

## 2019-05-04 ENCOUNTER — Other Ambulatory Visit: Payer: Self-pay

## 2019-05-04 VITALS — Temp 96.9°F

## 2019-05-04 DIAGNOSIS — E538 Deficiency of other specified B group vitamins: Secondary | ICD-10-CM | POA: Diagnosis not present

## 2019-05-04 MED ORDER — CYANOCOBALAMIN 1000 MCG/ML IJ SOLN
1000.0000 ug | Freq: Once | INTRAMUSCULAR | Status: AC
  Administered 2019-05-04: 1000 ug via INTRAMUSCULAR

## 2019-05-04 NOTE — Progress Notes (Addendum)
Pt in office today for Vit B-12 injection in R- Deltoid. Pt tolerated well.  Agree  [place]

## 2019-05-12 ENCOUNTER — Other Ambulatory Visit: Payer: Self-pay | Admitting: Family Medicine

## 2019-06-01 ENCOUNTER — Ambulatory Visit (INDEPENDENT_AMBULATORY_CARE_PROVIDER_SITE_OTHER): Payer: Medicare Other

## 2019-06-01 ENCOUNTER — Other Ambulatory Visit: Payer: Self-pay

## 2019-06-01 DIAGNOSIS — E538 Deficiency of other specified B group vitamins: Secondary | ICD-10-CM

## 2019-06-01 MED ORDER — CYANOCOBALAMIN 1000 MCG/ML IJ SOLN
1000.0000 ug | Freq: Once | INTRAMUSCULAR | Status: AC
  Administered 2019-06-01: 1000 ug via INTRAMUSCULAR

## 2019-06-01 NOTE — Progress Notes (Signed)
Pt presents today for b12 injection. Left deltoid, IM. Pt tolerated injection well.

## 2019-07-06 ENCOUNTER — Ambulatory Visit (INDEPENDENT_AMBULATORY_CARE_PROVIDER_SITE_OTHER): Payer: Medicare Other

## 2019-07-06 ENCOUNTER — Other Ambulatory Visit: Payer: Self-pay

## 2019-07-06 DIAGNOSIS — E538 Deficiency of other specified B group vitamins: Secondary | ICD-10-CM

## 2019-07-06 MED ORDER — CYANOCOBALAMIN 1000 MCG/ML IJ SOLN
1000.0000 ug | Freq: Once | INTRAMUSCULAR | Status: AC
  Administered 2019-07-06: 1000 ug via INTRAMUSCULAR

## 2019-07-06 NOTE — Progress Notes (Signed)
Patient presented for B 12 injection to right deltoid, patient voiced no concerns nor showed any signs of distress during injection. 

## 2019-07-15 ENCOUNTER — Other Ambulatory Visit: Payer: Self-pay | Admitting: Cardiovascular Disease

## 2019-07-15 DIAGNOSIS — R609 Edema, unspecified: Secondary | ICD-10-CM

## 2019-07-19 ENCOUNTER — Ambulatory Visit (INDEPENDENT_AMBULATORY_CARE_PROVIDER_SITE_OTHER): Payer: Medicare Other | Admitting: Physician Assistant

## 2019-07-19 ENCOUNTER — Encounter: Payer: Self-pay | Admitting: Physician Assistant

## 2019-07-19 ENCOUNTER — Other Ambulatory Visit: Payer: Self-pay

## 2019-07-19 VITALS — BP 103/72 | HR 66 | Ht 59.0 in | Wt 136.0 lb

## 2019-07-19 DIAGNOSIS — Z8719 Personal history of other diseases of the digestive system: Secondary | ICD-10-CM

## 2019-07-19 DIAGNOSIS — I4821 Permanent atrial fibrillation: Secondary | ICD-10-CM

## 2019-07-19 DIAGNOSIS — I5020 Unspecified systolic (congestive) heart failure: Secondary | ICD-10-CM | POA: Diagnosis not present

## 2019-07-19 DIAGNOSIS — I959 Hypotension, unspecified: Secondary | ICD-10-CM

## 2019-07-19 DIAGNOSIS — N183 Chronic kidney disease, stage 3 unspecified: Secondary | ICD-10-CM

## 2019-07-19 DIAGNOSIS — I350 Nonrheumatic aortic (valve) stenosis: Secondary | ICD-10-CM

## 2019-07-19 DIAGNOSIS — E785 Hyperlipidemia, unspecified: Secondary | ICD-10-CM

## 2019-07-19 DIAGNOSIS — I2510 Atherosclerotic heart disease of native coronary artery without angina pectoris: Secondary | ICD-10-CM | POA: Diagnosis not present

## 2019-07-19 DIAGNOSIS — I255 Ischemic cardiomyopathy: Secondary | ICD-10-CM

## 2019-07-19 NOTE — Patient Instructions (Signed)
Medication Instructions:  1- STOP metoprolol  *If you need a refill on your cardiac medications before your next appointment, please call your pharmacy*   Lab Work: None ordered  If you have labs (blood work) drawn today and your tests are completely normal, you will receive your results only by: Marland Kitchen MyChart Message (if you have MyChart) OR . A paper copy in the mail If you have any lab test that is abnormal or we need to change your treatment, we will call you to review the results.   Testing/Procedures: None ordered    Follow-Up: At [facility] you and your health needs are our priority.  As part of our continuing mission to provide you with exceptional heart care, we have created designated Provider Care Teams.  These Care Teams include your primary Cardiologist (physician) and Advanced Practice Providers (APPs -  Physician Assistants and Nurse Practitioners) who all work together to provide you with the care you need, when you need it.  We recommend signing up for the patient portal called "MyChart".  Sign up information is provided on this After Visit Summary.  MyChart is used to connect with patients for Virtual Visits (Telemedicine).  Patients are able to view lab/test results, encounter notes, upcoming appointments, etc.  Non-urgent messages can be sent to your provider as well.   To learn more about what you can do with MyChart, go to [email or web address].    Your next appointment:   6 month(s)  The format for your next appointment:   In Person  Provider:    You may see Ida Rogue, MD or Christell Faith, PA-C.

## 2019-07-19 NOTE — Progress Notes (Signed)
Cardiology Office Note    Date:  07/19/2019   ID:  Dawn Cooper, DOB 1919-03-05, MRN [id number]  PCP:  Leone Haven, MD  Cardiologist:  Ida Rogue, MD  Electrophysiologist:  None   Chief Complaint: Follow up  History of Present Illness:   Dawn Cooper is a 86 y.o. female with history of CAD with anterior STEMI in 07/2016 s/p PCI/DES to the mid LAD, HFrEF secondary to ICM, permanent Afib not on [place], severe aortic stenosis, CVA in 08/2014, GI bleed with AVMs unable to tolerate anticoagulation, CKD stage III, HTN, HLD, LBBB, and GERD who presents for follow up of her CAD, ICM, aortic stenosis, and Afib.   She has been followed for aortic stenosis over the years and was referred to the structural heart team in 06/2016 for evaluation of this. Prior echo at that time showed an EF of 55 to 60%, normal wall motion, critical aortic valve stenosis, moderate mitral regurgitation, moderately dilated left atrium, mildly dilated right atrium, PASP 55 mmHg. She underwent diagnostic LHC on 08/07/2016 as part of her aortic stenosis evaluation, which revealed severe LAD stenosis. 48-hours later, she presented with an anterior STEMI with successful PCI/DES to the LAD. She had severe LV dysfunction with this with an EF of 25-30% following her infarct. Follow up echo, post PCI, continued to show a severely reduced EF of 25-30%. Given her comorbid conditions, including poor functional status, advanced age, recent MI with residual cardiomyopathy, medical therapy was advised for her aortic valve disease. Her most recent echo from 10/2017, continued to show a severely reduced EF of 25-30%, mild concentric LVH, anterior, anteroseptal, and apical HK, severe aortic stenosis with a mean gradient of 40 mmHg and a VTI of 0.35, mild to moderate mitral regurgitation, severely dilated left atrium, mildly dilated right atrium, and PASP 38 mmHg. She was last seen by Dr. Rockey Situ in 12/2018 for follow up with periodic SOB  noted and chronic fatigue. She was living with her daughter. She was taking diuretic QOD. No medication changes were advised.   She comes in accompanied by her daughter today.  Overall, she is doing reasonably well.  She does note some positional dizziness as well as generalized fatigue.  No chest pain, worsening dyspnea, palpitations, presyncope, syncope, or lower extremity swelling.  No falls since she was last seen.  No hematochezia or melena.  Blood pressure at home when it was last checked on 4/12 was 103/76.  She is tolerating Lopressor 12.5 mg twice daily without issues along with Lasix 20 mg every other day.  Appetite fluctuates though she does supplement with a Boost on a daily basis.   Labs independently reviewed: 03/2019 - HGB 12.2, PLT 172, potassium 3.7, BUN 20, SCr 1.53, AST/ALT normal, albumin 3.5 09/2017 - TSH normal 07/2016 - TC 154, TG 94, HDL 33, LDL 102  Past Medical History:  Diagnosis Date  . A-fib ([place])   . Broken leg    right  . Cardiomyopathy   . Chronic kidney disease   . Common bile duct stone   . Duodenal ulceration   . Gastrointestinal hemorrhage with hematemesis   . GERD (gastroesophageal reflux disease)   . Heart murmur   . Hiatal hernia   . Hypertension   . Mild mitral regurgitation by prior echocardiogram   . Moderate aortic stenosis   . Myocardial infarction ([place])   . Stroke ([place])   . Tricuspid regurgitation     Past Surgical History:  Procedure Laterality Date  .  APPENDECTOMY    . CATARACT EXTRACTION  2013   left  . CHOLECYSTECTOMY    . CORONARY ANGIOGRAPHY N/A 08/09/2016   Procedure: Coronary Angiography;  Surgeon: Burnell Blanks, MD;  Location: [facility];  Service: Cardiovascular;  Laterality: N/A;  . CORONARY ANGIOPLASTY    . CORONARY ARTERY BYPASS GRAFT    . CORONARY STENT INTERVENTION N/A 08/09/2016   Procedure: Coronary Stent Intervention;  Surgeon: Burnell Blanks, MD;  Location: [facility];  Service:  Cardiovascular;  Laterality: N/A;  . ERCP N/A 10/07/2017   Procedure: ENDOSCOPIC RETROGRADE CHOLANGIOPANCREATOGRAPHY (ERCP);  Surgeon: Lucilla Lame, MD;  Location: [facility] ENDOSCOPY;  Service: Endoscopy;  Laterality: N/A;  . ESOPHAGOGASTRODUODENOSCOPY (EGD) WITH PROPOFOL N/A 10/08/2017   Procedure: ESOPHAGOGASTRODUODENOSCOPY (EGD) WITH PROPOFOL;  Surgeon: Lucilla Lame, MD;  Location: [facility] ENDOSCOPY;  Service: Endoscopy;  Laterality: N/A;  . EYE SURGERY    . FEMUR FRACTURE SURGERY  12/2011   Dr. Sabra Heck, s/p rehab at [facility]  . FRACTURE SURGERY    . GALLBLADDER SURGERY    . IR RADIOLOGY PERIPHERAL GUIDED IV START  07/29/2016  . IR US GUIDE VASC ACCESS RIGHT  07/29/2016  . VAGINAL DELIVERY      Current Medications: Current Meds  Medication Sig  . clopidogrel (PLAVIX) 75 MG tablet TAKE 1 TABLET EVERY DAY  . furosemide (LASIX) 20 MG tablet TAKE 1 TABLET EVERY OTHER DAY.  . nitroGLYCERIN (NITROSTAT) 0.4 MG SL tablet Place 1 tablet (0.4 mg total) under the tongue every 5 (five) minutes x 3 doses as needed for chest pain.  . pantoprazole (PROTONIX) 40 MG tablet TAKE 1 TABLET EVERY DAY  . potassium chloride (KLOR-CON) 10 MEQ tablet TAKE 1 TABLET EVERY OTHER DAY  . [DISCONTINUED] metoprolol tartrate (LOPRESSOR) 25 MG tablet TAKE 1/2 TABLET TWICE DAILY    Allergies:   Dabigatran, Dabigatran etexilate mesylate, Clonidine, Clonidine derivatives, and Penicillins   Social History   Socioeconomic History  . Marital status: Widowed    Spouse name: Not on file  . Number of children: Not on file  . Years of education: Not on file  . Highest education level: Not on file  Occupational History    Comment: Retired  Tobacco Use  . Smoking status: Never Smoker  . Smokeless tobacco: Never Used  Substance and Sexual Activity  . Alcohol use: Never  . Drug use: Never  . Sexual activity: Never  Other Topics Concern  . Not on file  Social History Narrative   Lives in [place] alone. 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 3  children.   Social Determinants of Health   Financial Resource Strain: Low Risk   . Difficulty of Paying Living Expenses: Not hard at all  Food Insecurity: No Food Insecurity  . Worried About Charity fundraiser in the Last Year: Never true  . Ran Out of Food in the Last Year: Never true  Transportation Needs: No Transportation Needs  . Lack of Transportation (Medical): No  . Lack of Transportation (Non-Medical): No  Physical Activity: Unknown  . Days of Exercise per Week: 0 days  . Minutes of Exercise per Session: Not on file  Stress: No Stress Concern Present  . Feeling of Stress : Not at all  Social Connections: Unknown  . Frequency of Communication with Friends and Family: More than three times a week  . Frequency of Social Gatherings with Friends and Family: More than three times a week  . Attends Religious Services: Not on file  .  Active Member of Clubs or Organizations: Not on file  . Attends Archivist Meetings: Not on file  . Marital Status: Not on file     Family History:  The patient's family history includes Cancer in her brother and sister; Diabetes in her brother and another family member; Stroke in her sister. There is no history of Heart attack.  ROS:   Review of Systems  Constitutional: Positive for malaise/fatigue. Negative for chills, diaphoresis, fever and weight loss.  HENT: Negative for congestion.   Eyes: Negative for discharge and redness.  Respiratory: Negative for cough, sputum production, shortness of breath and wheezing.   Cardiovascular: Negative for chest pain, palpitations, orthopnea, claudication, leg swelling and PND.  Gastrointestinal: Negative for abdominal pain, blood in stool, heartburn, melena, nausea and vomiting.  Musculoskeletal: Negative for falls and myalgias.  Skin: Negative for rash.  Neurological: Positive for dizziness and weakness. Negative for tingling, tremors, sensory change, speech change, focal weakness and loss of  consciousness.  Endo/Heme/Allergies: Does not bruise/bleed easily.  Psychiatric/Behavioral: Negative for substance abuse. The patient is not nervous/anxious.   All other systems reviewed and are negative.    EKGs/Labs/Other Studies Reviewed:    Studies reviewed were summarized above. The additional studies were reviewed today:  2D echo 10/2017: - Left ventricle: The cavity size was normal. There was mild  concentric hypertrophy. Systolic function was severely reduced.  The estimated ejection fraction was in the range of 25% to 30%.  Hypokinesis of the anterior myocardium. Hypokinesis of the  anteroseptal myocardium. Hypokinesis of the apical myocardium.  The study is not technically sufficient to allow evaluation of LV  diastolic function.  - Aortic valve: There was severe stenosis. There was mild  regurgitation. Peak velocity (S): 417 cm/s. Mean gradient (S): 40  mm Hg. Valve area (VTI): 0.35 cm^2.  - Mitral valve: There was mild to moderate regurgitation.  - Left atrium: The atrium was severely dilated.  - Right atrium: The atrium was mildly dilated.  - Pulmonary arteries: Systolic pressure was mildly elevated. PA  peak pressure: 38 mm Hg (S).  __________  LHC 07/2014:  Ost 1st Diag lesion, 70 %stenosed.  A STENT SYNERGY DES 2.5X16 drug eluting stent was successfully placed.  Prox LAD lesion, 100 %stenosed.  Post intervention, there is a 0% residual stenosis.   1. Acute anterolateral STEMI secondary to thrombotic occlusion of known severe mid LAD and diagonal stenosis.  2. Successful PTCA/DES x 1 mid LAD   Recommendations: Will continue DAPT with ASA and Plavix. Start statin. Continue beta blocker. Can repeat echo before discharge to look at LV function. Admit to ICU tonight (2H). Would monitor in 2H for 24 hours before sending to telemetry unit. Continue TAVR workup as outpatient.  __________  [facility] 07/2016: 1. Known severe/critical aortic stenosis  by noninvasive assessment 2. Severe single-vessel coronary artery disease involving the proximal LAD/first diagonal bifurcation  Recommendations: The patient will continue with TAVR evaluation. Multiple comorbidities noted include advanced age, chronic kidney disease, and now with concomitant coronary artery disease. Patient scheduled to see Dr. Roxy Manns for formal cardiac surgical evaluation next week.   EKG:  EKG is ordered today.  The EKG ordered today demonstrates A. fib, 66 bpm, LBBB (known)  Recent Labs: 03/31/2019: ALT 9; BUN 20; Creatinine, Ser 1.53; Hemoglobin 12.2; Platelets 172.0; Potassium 3.7; Sodium 142  Recent Lipid Panel    Component Value Date/Time   CHOL 154 08/09/2016 2325   TRIG 94 08/09/2016 2325   HDL 33 (  L) 08/09/2016 2325   CHOLHDL 4.7 08/09/2016 2325   VLDL 19 08/09/2016 2325   LDLCALC 102 (H) 08/09/2016 2325    PHYSICAL EXAM:    VS:  BP 103/72   Pulse 66   Ht 4\' 11"  (1.499 m)   Wt 136 lb (61.7 kg)   SpO2 98%   BMI 27.47 kg/m   BMI: Body mass index is 27.47 kg/m.  Physical Exam  Constitutional: She is oriented to person, place, and time. She appears well-developed and well-nourished.  Elderly appearing  HENT:  Head: Normocephalic and atraumatic.  Eyes: Right eye exhibits no discharge. Left eye exhibits no discharge.  Neck: No JVD present.  Cardiovascular: Normal rate, S1 normal and S2 normal. An irregularly irregular rhythm present. Exam reveals no distant heart sounds, no friction rub, no midsystolic click and no opening snap.  Murmur heard.  Harsh midsystolic murmur is present with a grade of 3/6 at the upper right sternal border radiating to the neck. Pulmonary/Chest: Effort normal and breath sounds normal. No respiratory distress. She has no decreased breath sounds. She has no wheezes. She has no rales. She exhibits no tenderness.  Abdominal: Soft. She exhibits no distension. There is no abdominal tenderness.  Musculoskeletal:        General: No  edema.     Cervical back: Normal range of motion.  Neurological: She is alert and oriented to person, place, and time.  Skin: Skin is warm and dry. No cyanosis. Nails show no clubbing.  Psychiatric: She has a normal mood and affect. Her speech is normal and behavior is normal. Judgment and thought content normal.    Wt Readings from Last 3 Encounters:  07/19/19 136 lb (61.7 kg)  03/24/19 136 lb (61.7 kg)  03/23/19 136 lb (61.7 kg)     ASSESSMENT & PLAN:   1. CAD involving the native coronary arteries without angina: She is doing well without any symptoms concerning for angina.  She remains on Plavix monotherapy in the setting of underlying coronary disease with history of GI bleed in the setting of AVMs.  She denies any symptoms concerning for bleeding.  Recent CBC demonstrated stable hemoglobin as outlined above.  Not on a statin as outlined below.  No plans for further ischemic evaluation at this time.  2. HFrEF secondary to ICM: She appears euvolemic and well compensated.  Relative hypotension has precluded escalation of GDMT.  In the setting of soft systolic blood pressure in the low 100s, given her significantly advanced age, we have elected to discontinue metoprolol in an effort to prevent orthostasis with potential fall/syncope.  Not a candidate for further GDMT at this time given advanced age and relative hypotension.  Continue low-sodium diet.  3. Permanent A. Fib: Ventricular rate well controlled.  Not on [place] secondary to history of GI bleed with known AVMs.  Hold metoprolol as outlined below.  Monitor heart rates at home with this.  4. Severe aortic stenosis: Previously evaluated by the structural heart clinic and not felt to be a candidate for repair/replacement.  Continue medical management.  Therapeutic window is narrow.  5. Hypotension: Initial BP at triage 78/52 with recheck BP 74/42 by CMA.  BP obtained by me of 103/72 with same BP cuff.  BP obtained by automated  sphygmomanometer of 100/66.  Patient is nontoxic appearing and appears at baseline per daughter.  I suspect the initial BPs were erroneous.  Nonetheless, given her advanced age and with a soft BP at baseline we have elected  to discontinue metoprolol at this time.  Patient's daughter will monitor BP and heart rate and let us know if her BP consistently runs greater than [id number] systolic or if her heart rate consistently runs greater than 100 bpm.  6. History of GI bleed with AVMs: Cannot exclude Heyde's syndrome given her underlying severe aortic stenosis.  Stable hemoglobin as outlined above.  She denies any symptoms concerning for hematochezia or melena.  7. CKD stage III: Stable on most recent BMP.  8. HLD: LDL of 102 from 07/2016.  Not on a statin secondary to significantly advanced age.  Disposition: F/u with Dr. Rockey Situ or an APP in 6 months, sooner if needed.   Medication Adjustments/Labs and Tests Ordered: Current medicines are reviewed at length with the patient today.  Concerns regarding medicines are outlined above. Medication changes, Labs and Tests ordered today are summarized above and listed in the Patient Instructions accessible in Encounters.   Signed, Christell Faith, PA-C 07/19/2019 1:10 PM     [address] [place], [place] [address] [phone]

## 2019-07-28 ENCOUNTER — Other Ambulatory Visit: Payer: Self-pay | Admitting: Family Medicine

## 2019-08-10 ENCOUNTER — Ambulatory Visit (INDEPENDENT_AMBULATORY_CARE_PROVIDER_SITE_OTHER): Payer: Medicare Other

## 2019-08-10 ENCOUNTER — Other Ambulatory Visit: Payer: Self-pay

## 2019-08-10 DIAGNOSIS — E538 Deficiency of other specified B group vitamins: Secondary | ICD-10-CM

## 2019-08-10 MED ORDER — CYANOCOBALAMIN 1000 MCG/ML IJ SOLN
1000.0000 ug | Freq: Once | INTRAMUSCULAR | Status: AC
  Administered 2019-08-10: 1000 ug via INTRAMUSCULAR

## 2019-08-10 NOTE — Progress Notes (Signed)
Patient presented for B 12 injection to left deltoid, patient voiced no concerns nor showed any signs of distress during injection. 

## 2019-09-14 ENCOUNTER — Ambulatory Visit (INDEPENDENT_AMBULATORY_CARE_PROVIDER_SITE_OTHER): Payer: Medicare Other

## 2019-09-14 ENCOUNTER — Other Ambulatory Visit: Payer: Self-pay

## 2019-09-14 DIAGNOSIS — E538 Deficiency of other specified B group vitamins: Secondary | ICD-10-CM

## 2019-09-14 MED ORDER — CYANOCOBALAMIN 1000 MCG/ML IJ SOLN
1000.0000 ug | Freq: Once | INTRAMUSCULAR | Status: AC
  Administered 2019-09-14: 1000 ug via INTRAMUSCULAR

## 2019-09-14 NOTE — Progress Notes (Signed)
Patient presented for B 12 injection to right deltoid, patient voiced no concerns nor showed any signs of distress during injection. 

## 2019-09-26 ENCOUNTER — Other Ambulatory Visit: Payer: Self-pay

## 2019-09-26 ENCOUNTER — Emergency Department (HOSPITAL_COMMUNITY): Payer: Medicare Other

## 2019-09-26 ENCOUNTER — Emergency Department (HOSPITAL_COMMUNITY)
Admission: EM | Admit: 2019-09-26 | Discharge: 2019-09-27 | Disposition: A | Discharge status: Home / Self Care | Payer: Medicare Other | Attending: Emergency Medicine | Admitting: Emergency Medicine

## 2019-09-26 ENCOUNTER — Encounter (HOSPITAL_COMMUNITY): Payer: Self-pay

## 2019-09-26 DIAGNOSIS — S0181XA Laceration without foreign body of other part of head, initial encounter: Secondary | ICD-10-CM | POA: Insufficient documentation

## 2019-09-26 DIAGNOSIS — W01198A Fall on same level from slipping, tripping and stumbling with subsequent striking against other object, initial encounter: Secondary | ICD-10-CM | POA: Diagnosis not present

## 2019-09-26 DIAGNOSIS — R519 Headache, unspecified: Secondary | ICD-10-CM | POA: Diagnosis not present

## 2019-09-26 DIAGNOSIS — Y998 Other external cause status: Secondary | ICD-10-CM | POA: Diagnosis not present

## 2019-09-26 DIAGNOSIS — Z7901 Long term (current) use of anticoagulants: Secondary | ICD-10-CM | POA: Insufficient documentation

## 2019-09-26 DIAGNOSIS — W19XXXA Unspecified fall, initial encounter: Secondary | ICD-10-CM

## 2019-09-26 DIAGNOSIS — S51011A Laceration without foreign body of right elbow, initial encounter: Secondary | ICD-10-CM | POA: Diagnosis not present

## 2019-09-26 DIAGNOSIS — Y92018 Other place in single-family (private) house as the place of occurrence of the external cause: Secondary | ICD-10-CM | POA: Insufficient documentation

## 2019-09-26 DIAGNOSIS — S52124A Nondisplaced fracture of head of right radius, initial encounter for closed fracture: Secondary | ICD-10-CM | POA: Diagnosis not present

## 2019-09-26 DIAGNOSIS — I4891 Unspecified atrial fibrillation: Secondary | ICD-10-CM | POA: Diagnosis not present

## 2019-09-26 DIAGNOSIS — Y9301 Activity, walking, marching and hiking: Secondary | ICD-10-CM | POA: Insufficient documentation

## 2019-09-26 DIAGNOSIS — R1011 Right upper quadrant pain: Secondary | ICD-10-CM | POA: Diagnosis not present

## 2019-09-26 DIAGNOSIS — I4892 Unspecified atrial flutter: Secondary | ICD-10-CM | POA: Diagnosis not present

## 2019-09-26 DIAGNOSIS — T1490XA Injury, unspecified, initial encounter: Secondary | ICD-10-CM

## 2019-09-26 DIAGNOSIS — R079 Chest pain, unspecified: Secondary | ICD-10-CM | POA: Diagnosis not present

## 2019-09-26 DIAGNOSIS — S4991XA Unspecified injury of right shoulder and upper arm, initial encounter: Secondary | ICD-10-CM | POA: Diagnosis present

## 2019-09-26 DIAGNOSIS — S52514A Nondisplaced fracture of right radial styloid process, initial encounter for closed fracture: Secondary | ICD-10-CM | POA: Insufficient documentation

## 2019-09-26 DIAGNOSIS — S01111A Laceration without foreign body of right eyelid and periocular area, initial encounter: Secondary | ICD-10-CM | POA: Diagnosis not present

## 2019-09-26 LAB — COMPREHENSIVE METABOLIC PANEL
ALT: 13 U/L (ref 0–44)
AST: 27 U/L (ref 15–41)
Albumin: 3.2 g/dL — ABNORMAL LOW (ref 3.5–5.0)
Alkaline Phosphatase: 58 U/L (ref 38–126)
Anion gap: 11 (ref 5–15)
BUN: 17 mg/dL (ref 8–23)
CO2: 20 mmol/L — ABNORMAL LOW (ref 22–32)
Calcium: 9.4 mg/dL (ref 8.9–10.3)
Chloride: 109 mmol/L (ref 98–111)
Creatinine, Ser: 1.6 mg/dL — ABNORMAL HIGH (ref 0.44–1.00)
GFR calc Af Amer: 31 mL/min — ABNORMAL LOW (ref >60)
GFR calc non Af Amer: 27 mL/min — ABNORMAL LOW (ref >60)
Glucose, Bld: 142 mg/dL — ABNORMAL HIGH (ref 70–99)
Potassium: 4.1 mmol/L (ref 3.5–5.1)
Sodium: 140 mmol/L (ref 135–145)
Total Bilirubin: 0.9 mg/dL (ref 0.3–1.2)
Total Protein: 6.7 g/dL (ref 6.5–8.1)

## 2019-09-26 LAB — I-STAT CHEM 8, ED
BUN: 19 mg/dL (ref 8–23)
Calcium, Ion: 0.97 mmol/L — ABNORMAL LOW (ref 1.15–1.40)
Chloride: 109 mmol/L (ref 98–111)
Creatinine, Ser: 1.6 mg/dL — ABNORMAL HIGH (ref 0.44–1.00)
Glucose, Bld: 137 mg/dL — ABNORMAL HIGH (ref 70–99)
HCT: 34 % — ABNORMAL LOW (ref 36.0–46.0)
Hemoglobin: 11.6 g/dL — ABNORMAL LOW (ref 12.0–15.0)
Potassium: 4.4 mmol/L (ref 3.5–5.1)
Sodium: 140 mmol/L (ref 135–145)
TCO2: 20 mmol/L — ABNORMAL LOW (ref 22–32)

## 2019-09-26 LAB — CBC
HCT: 37.1 % (ref 36.0–46.0)
Hemoglobin: 12.3 g/dL (ref 12.0–15.0)
MCH: 31.8 pg (ref 26.0–34.0)
MCHC: 33.2 g/dL (ref 30.0–36.0)
MCV: 95.9 fL (ref 80.0–100.0)
Platelets: 163 10*3/uL (ref 150–400)
RBC: 3.87 MIL/uL (ref 3.87–5.11)
RDW: 13.4 % (ref 11.5–15.5)
WBC: 6.8 10*3/uL (ref 4.0–10.5)
nRBC: 0 % (ref 0.0–0.2)

## 2019-09-26 LAB — PROTIME-INR
INR: 1.2 (ref 0.8–1.2)
Prothrombin Time: 14.3 seconds (ref 11.4–15.2)

## 2019-09-26 LAB — SAMPLE TO BLOOD BANK

## 2019-09-26 LAB — APTT: aPTT: 33 seconds (ref 24–36)

## 2019-09-26 MED ORDER — IOHEXOL 300 MG/ML  SOLN
80.0000 mL | Freq: Once | INTRAMUSCULAR | Status: AC | PRN
  Administered 2019-09-26: 80 mL via INTRAVENOUS

## 2019-09-26 MED ORDER — LIDOCAINE-EPINEPHRINE 1 %-1:100000 IJ SOLN
10.0000 mL | Freq: Once | INTRAMUSCULAR | Status: DC
  Filled 2019-09-26: qty 10

## 2019-09-26 NOTE — ED Provider Notes (Signed)
[facility] Provider Note   CSN: [id number] Arrival date & time: 09/26/19  1903     History Chief Complaint  Patient presents with  . Trauma  . Fall    Dawn Cooper is a 86 y.o. female.  The history is provided by the patient and the EMS personnel.  Trauma Mechanism of injury: fall Injury location: shoulder/arm, torso and face Injury location detail: forehead and R eyebrow, R arm and R chest and abd RUQ Incident location: home Arrived directly from scene: yes   Fall:      Fall occurred: walking (From standing. Witnessed fall over step)      Impact surface: concrete      Suspicion of alcohol use: no      Suspicion of drug use: no  EMS/PTA data:      Oriented to: person, place and situation      Loss of consciousness: no      Amnesic to event: no      IV access: established  Current symptoms:      Associated symptoms:            Reports abdominal pain, chest pain and headache.            Denies back pain, difficulty breathing, loss of consciousness, nausea, neck pain, seizures and vomiting.   Relevant PMH:      Pharmacological risk factors:            Anticoagulation therapy and beta blocker therapy.  Fall Associated symptoms include chest pain, abdominal pain and headaches. Pertinent negatives include no shortness of breath.       History reviewed. No pertinent past medical history.  There are no problems to display for this patient.   History reviewed. No pertinent surgical history.   OB History   No obstetric history on file.     No family history on file.  Social History   Tobacco Use  . Smoking status: Not on file  Substance Use Topics  . Alcohol use: Not on file  . Drug use: Not on file    Home Medications Prior to Admission medications   Medication Sig Start Date End Date Taking? Authorizing Provider  acetaminophen (TYLENOL) 500 MG tablet Take 2 tablets (1,000 mg total) by mouth every 8 (eight)  hours for 5 days. Do not take more than 4000 mg of acetaminophen (Tylenol) in a 24-hour period. Please note that other medicines that you may be prescribed may have Tylenol as well. 09/27/19 10/02/19  Fatima Blank, MD  HYDROcodone-acetaminophen (NORCO/VICODIN) 5-325 MG tablet Take 0.5-1 tablets by mouth every 8 (eight) hours as needed for up to 5 days for severe pain (That is not improved by your scheduled acetaminophen regimen). Please do not exceed 4000 mg of acetaminophen (Tylenol) a 24-hour period. Please note that he may be prescribed additional medicine that contains acetaminophen. 09/27/19 10/02/19  Fatima Blank, MD    Allergies    Patient has no allergy information on record.  Review of Systems   Review of Systems  Constitutional: Negative for chills and fever.  HENT: Negative for congestion, rhinorrhea and sneezing.   Eyes: Negative for visual disturbance.  Respiratory: Negative for cough and shortness of breath.   Cardiovascular: Positive for chest pain. Negative for leg swelling.  Gastrointestinal: Positive for abdominal pain. Negative for nausea and vomiting.  Musculoskeletal: Negative for back pain and neck pain.  Skin: Positive for wound. Negative for rash.  Neurological: Positive  for headaches. Negative for dizziness, seizures, loss of consciousness and syncope.  Psychiatric/Behavioral: Negative.     Physical Exam Updated Vital Signs BP 103/81   Pulse (!) 44   Temp 97.6 F (36.4 C) (Oral)   Resp (!) 21   Ht 5\' 8"  (1.727 m)   Wt 74.8 kg   SpO2 98%   BMI 25.09 kg/m   Physical Exam Vitals and nursing note reviewed.  Constitutional:      General: She is not in acute distress.    Appearance: She is normal weight. She is not ill-appearing, toxic-appearing or diaphoretic.  HENT:     Head: Normocephalic.     Comments: Large hematoma with small laceration above R eyebrow (hemostatic)    Nose: Nose normal.  Eyes:     Extraocular Movements: Extraocular  movements intact.     Conjunctiva/sclera: Conjunctivae normal.     Pupils: Pupils are equal, round, and reactive to light.  Cardiovascular:     Rate and Rhythm: Normal rate and regular rhythm.     Heart sounds: Normal heart sounds. No murmur heard.   Pulmonary:     Effort: Pulmonary effort is normal. No respiratory distress.     Breath sounds: Normal breath sounds. No wheezing or rales.  Abdominal:     General: There is no distension.     Palpations: Abdomen is soft.     Tenderness: There is abdominal tenderness. There is no guarding or rebound.  Musculoskeletal:     Cervical back: Neck supple. No tenderness.     Comments: Swelling with laceration to posterior aspect R elbow.   Skin:    General: Skin is warm.     Findings: Bruising present. No rash.     Comments: Bruising noted to b/l forearms   Neurological:     General: No focal deficit present.     Mental Status: She is alert and oriented to person, place, and time. Mental status is at baseline.     Cranial Nerves: No cranial nerve deficit.  Psychiatric:        Mood and Affect: Mood normal.        Behavior: Behavior normal.     ED Results / Procedures / Treatments   Labs (all labs ordered are listed, but only abnormal results are displayed) Labs Reviewed  COMPREHENSIVE METABOLIC PANEL - Abnormal; Notable for the following components:      Result Value   CO2 20 (*)    Glucose, Bld 142 (*)    Creatinine, Ser 1.60 (*)    Albumin 3.2 (*)    GFR calc non Af Amer 27 (*)    GFR calc Af Amer 31 (*)    All other components within normal limits  I-STAT CHEM 8, ED - Abnormal; Notable for the following components:   Creatinine, Ser 1.60 (*)    Glucose, Bld 137 (*)    Calcium, Ion 0.97 (*)    TCO2 20 (*)    Hemoglobin 11.6 (*)    HCT 34.0 (*)    All other components within normal limits  CBC  ETHANOL  LACTIC ACID, PLASMA  PROTIME-INR  APTT  SAMPLE TO BLOOD BANK    EKG EKG Interpretation  Date/Time:  Sunday September 26 2019 20:43:45 EDT Ventricular Rate:  72 PR Interval:    QRS Duration: 164 QT Interval:  423 QTC Calculation: 463 R Axis:   38 Text Interpretation: Atrial flutter with predominant 4:1 AV block Ventricular premature complex Nonspecific intraventricular conduction delay  Repol abnrm suggests ischemia, diffuse leads Confirmed by Elnora Morrison [phone]) on 09/26/2019 8:46:36 PM Also confirmed by Elnora Morrison [phone]), editor Victory Dakin [phone])  on 09/27/2019 12:54:31 PM   Radiology DG Chest 1 View  Result Date: 09/26/2019 CLINICAL DATA:  RIGHT arm pain, worse at the elbow after falling. EXAM: CHEST  1 VIEW COMPARISON:  None. FINDINGS: The heart is enlarged. There is atherosclerotic calcification of the tortuous aorta. There is dense opacity in the MEDIAL LEFT lung base, consistent atelectasis or infiltrate and pleural effusion. Mildly prominent interstitial markings are likely chronic. RIGHT lung is otherwise clear. No pneumothorax. No acute displaced fractures. IMPRESSION: 1. Cardiomegaly. 2. LEFT lower lobe atelectasis or infiltrate and pleural effusion. Electronically Signed   By: Nolon Nations M.D.   On: 09/26/2019 20:32   DG Forearm Right  Result Date: 09/26/2019 CLINICAL DATA:  Recent fall with right forearm pain, initial encounter EXAM: RIGHT FOREARM - 2 VIEW COMPARISON:  None. FINDINGS: There is a faint lucency in the distal aspect of the radius incompletely evaluated on this exam. This may represent an undisplaced radial styloid fracture. Elevation of the fat pads at the elbow joint are noted and there is an impaction fracture of the radial head best seen on the humerus films. No other fracture is noted. No soft tissue abnormality is seen. IMPRESSION: Changes consistent with a radial head impaction fracture with joint effusion in the elbow. Faint lucency in the distal radius laterally which may represent a radial styloid fracture. Correlation to point tenderness is recommended. Dedicated wrist  films may be helpful. Electronically Signed   By: Inez Catalina M.D.   On: 09/26/2019 20:36   CT HEAD WO CONTRAST  Result Date: 09/26/2019 CLINICAL DATA:  Status post fall. EXAM: CT HEAD WITHOUT CONTRAST TECHNIQUE: Contiguous axial images were obtained from the base of the skull through the vertex without intravenous contrast. COMPARISON:  September 09, 2014 FINDINGS: Brain: There is moderate severity cerebral atrophy with widening of the extra-axial spaces and ventricular dilatation. There are areas of decreased attenuation within the white matter tracts of the supratentorial brain, consistent with microvascular disease changes. A small area of cortical encephalomalacia, with adjacent chronic white matter low attenuation, is seen within the left occipital lobe. Vascular: No hyperdense vessel or unexpected calcification. Skull: Normal. Negative for fracture or focal lesion. Sinuses/Orbits: No acute finding. Other: There is marked severity right frontal and right periorbital soft tissue swelling. An associated hematoma is noted. IMPRESSION: 1. Marked severity right frontal and right periorbital soft tissue swelling with an associated hematoma. 2. No acute intracranial abnormality. 3. Moderate severity cerebral atrophy and microvascular disease changes of the supratentorial brain. 4. Small, chronic left occipital lobe infarct. Electronically Signed   By: Virgina Norfolk M.D.   On: 09/26/2019 20:04   CT CHEST W CONTRAST  Result Date: 09/26/2019 CLINICAL DATA:  Status post fall. EXAM: CT CHEST, ABDOMEN, AND PELVIS WITH CONTRAST TECHNIQUE: Multidetector CT imaging of the chest, abdomen and pelvis was performed following the standard protocol during bolus administration of intravenous contrast. CONTRAST:  17mL OMNIPAQUE IOHEXOL 300 MG/ML  SOLN COMPARISON:  None. FINDINGS: CT CHEST FINDINGS Cardiovascular: There is marked severity calcification of the thoracic aorta without evidence of aneurysmal dilatation or  dissection. Mild cardiomegaly is noted. No pericardial effusion. There is marked severity coronary artery calcification. Mediastinum/Nodes: No enlarged mediastinal, hilar, or axillary lymph nodes. Thyroid gland, trachea, and esophagus demonstrate no significant findings. Lungs/Pleura: Mild atelectasis is seen within the bilateral lung bases.  Small bilateral pleural effusions are seen. No pneumothorax is identified. Musculoskeletal: Degenerative changes seen throughout the thoracic spine. CT ABDOMEN PELVIS FINDINGS Hepatobiliary: No focal liver abnormality is seen. Status post cholecystectomy. No biliary dilatation. Pancreas: Unremarkable. No pancreatic ductal dilatation or surrounding inflammatory changes. Spleen: Normal in size without focal abnormality. Adrenals/Urinary Tract: Adrenal glands are unremarkable. Kidneys are normal in size, without renal calculi, focal lesion, or hydronephrosis. Bladder is unremarkable. Stomach/Bowel: There is a small hiatal hernia. The appendix is not clearly identified. No evidence of bowel dilatation. Noninflamed diverticula are seen within the descending and sigmoid colon. Vascular/Lymphatic: There is marked severity calcification and tortuosity of the abdominal aorta. No enlarged abdominal or pelvic lymph nodes. Reproductive: Uterus and bilateral adnexa are unremarkable. Other: No abdominal wall hernia or abnormality. No abdominopelvic ascites. Musculoskeletal: A metallic density intramedullary rod and compression screw device are seen within the proximal right femur. IMPRESSION: 1. Small bilateral pleural effusions. 2. Mild bibasilar atelectasis. 3. Mild cardiomegaly. 4. Marked severity coronary artery calcification. 5. Small hiatal hernia. 6. Colonic diverticulosis. Aortic Atherosclerosis (ICD10-I70.0). Electronically Signed   By: Virgina Norfolk M.D.   On: 09/26/2019 20:16   CT CERVICAL SPINE WO CONTRAST  Result Date: 09/26/2019 CLINICAL DATA:  Status post fall. EXAM: CT  CERVICAL SPINE WITHOUT CONTRAST TECHNIQUE: Multidetector CT imaging of the cervical spine was performed without intravenous contrast. Multiplanar CT image reconstructions were also generated. COMPARISON:  None. FINDINGS: Alignment: Approximately 1 mm to 2 mm retrolisthesis of the C6 vertebral body is noted on C7. Skull base and vertebrae: No acute fracture. No primary bone lesion or focal pathologic process. Soft tissues and spinal canal: No prevertebral fluid or swelling. No visible canal hematoma. Disc levels: Moderate to marked severity endplate sclerosis is seen at the level of C6-C7. Moderate to marked severity intervertebral disc space narrowing is also seen at this level. Moderate to marked severity multilevel bilateral facet joint hypertrophy is seen. Upper chest: Negative. Other: There is marked severity calcification of the bilateral common carotid arteries. IMPRESSION: 1. No acute fracture within the cervical spine. 2. Moderate to marked severity degenerative changes at the level of C6-C7. 3. Approximately 1 mm to 2 mm retrolisthesis of the C6 vertebral body on C7. Electronically Signed   By: Virgina Norfolk M.D.   On: 09/26/2019 20:08   CT ABDOMEN PELVIS W CONTRAST  Result Date: 09/26/2019 CLINICAL DATA:  Status post fall. EXAM: CT CHEST, ABDOMEN, AND PELVIS WITH CONTRAST TECHNIQUE: Multidetector CT imaging of the chest, abdomen and pelvis was performed following the standard protocol during bolus administration of intravenous contrast. CONTRAST:  75mL OMNIPAQUE IOHEXOL 300 MG/ML  SOLN COMPARISON:  October 08, 2017 FINDINGS: CT CHEST FINDINGS Cardiovascular: There is marked severity calcification of the thoracic aorta without evidence of aneurysmal dilatation or dissection. Mild cardiomegaly is noted. No pericardial effusion. There is marked severity coronary artery calcification. Mediastinum/Nodes: No enlarged mediastinal, hilar, or axillary lymph nodes. Thyroid gland, trachea, and esophagus  demonstrate no significant findings. Lungs/Pleura: Mild atelectasis is seen within the bilateral lung bases. Small bilateral pleural effusions are seen. No pneumothorax is identified. Musculoskeletal: Degenerative changes seen throughout the thoracic spine. CT ABDOMEN PELVIS FINDINGS Hepatobiliary: No focal liver abnormality is seen. Status post cholecystectomy. No biliary dilatation. Pancreas: Unremarkable. No pancreatic ductal dilatation or surrounding inflammatory changes. Spleen: Normal in size without focal abnormality. Adrenals/Urinary Tract: Adrenal glands are unremarkable. Kidneys are normal in size, without renal calculi, focal lesion, or hydronephrosis. Bladder is unremarkable. Stomach/Bowel: There is a small hiatal  hernia. The appendix is not clearly identified. No evidence of bowel dilatation. Noninflamed diverticula are seen within the descending and sigmoid colon. Vascular/Lymphatic: There is marked severity calcification and tortuosity of the abdominal aorta. No enlarged abdominal or pelvic lymph nodes. Reproductive: Uterus and bilateral adnexa are unremarkable. Other: No abdominal wall hernia or abnormality. No abdominopelvic ascites. Musculoskeletal: A metallic density intramedullary rod and compression screw device are seen within the proximal right femur. IMPRESSION: 1. Small bilateral pleural effusions. 2. Mild bibasilar atelectasis. 3. Mild cardiomegaly. 4. Marked severity coronary artery calcification. 5. Small hiatal hernia. 6. Colonic diverticulosis. Aortic Atherosclerosis (ICD10-I70.0). Electronically Signed   By: Virgina Norfolk M.D.   On: 09/26/2019 20:16   DG Humerus Right  Result Date: 09/26/2019 CLINICAL DATA:  Elbow pain following fall, initial encounter EXAM: RIGHT HUMERUS - 2+ VIEW COMPARISON:  None. FINDINGS: Humerus is intact. There is an impaction fracture in the proximal radius better visualized on this exam than on the prior forearm film. Degenerative changes of the  acromioclavicular and glenohumeral joints are seen. No soft tissue abnormality is noted. IMPRESSION: Proximal radial fracture as described. Degenerative changes. No humeral fracture is seen. Electronically Signed   By: Inez Catalina M.D.   On: 09/26/2019 20:37    Procedures .Marland KitchenLaceration Repair  Date/Time: 09/26/2019 10:58 PM Performed by: Kennyth Lose, MD Authorized by: Elnora Morrison, MD   Consent:    Consent obtained:  Verbal   Consent given by:  Patient   Alternatives discussed:  No treatment Laceration details:    Location:  Shoulder/arm   Shoulder/arm location:  R elbow   Length (cm):  2 Repair type:    Repair type:  Simple Pre-procedure details:    Preparation:  Patient was prepped and draped in usual sterile fashion Treatment:    Amount of cleaning:  Standard   Irrigation solution:  Sterile saline   Irrigation method:  Syringe Skin repair:    Repair method:  Sutures   Suture size:  5-0   Suture material:  Chromic gut   Suture technique:  Simple interrupted   Number of sutures:  6 Approximation:    Approximation:  Close Post-procedure details:    Dressing:  Antibiotic ointment and non-adherent dressing   Patient tolerance of procedure:  Tolerated well, no immediate complications .Marland KitchenLaceration Repair  Date/Time: 09/26/2019 10:59 PM Performed by: Kennyth Lose, MD Authorized by: Elnora Morrison, MD   Consent:    Consent obtained:  Verbal   Consent given by:  Patient Laceration details:    Location:  Face   Face location:  R eyebrow   Length (cm):  1 Repair type:    Repair type:  Simple Treatment:    Area cleansed with:  Saline   Irrigation solution:  Sterile saline Skin repair:    Repair method:  Tissue adhesive Post-procedure details:    Patient tolerance of procedure:  Tolerated well, no immediate complications   (including critical care time)  Medications Ordered in ED Medications  iohexol (OMNIPAQUE) 300 MG/ML solution 80 mL (80 mLs Intravenous  Contrast Given 09/26/19 1942)  acetaminophen (TYLENOL) tablet 1,000 mg (1,000 mg Oral Given 09/27/19 0120)    ED Course  I have reviewed the triage vital signs and the nursing notes.  Pertinent labs & imaging results that were available during my care of the patient were reviewed by me and considered in my medical decision making (see chart for details).    MDM Rules/Calculators/A&P  Dawn Cooper is a 86 y.o. female who presents as a level 2 trauma activation after sustaining injuries in a fall earlier this evening. Pt on plavix for Afib.   History is obtained from EMS as well as the patient. Patient was in doorway when she had a mechanical fall off doorway. Witnessed by family. Denies CP, SOB, Syncope, LOC. Denies URI symptoms, nausea, vomiting. On initial exam patient was GCS 15 ABC intact, and hemodynamically stable. The patient complained of pain to head, R arm, R chest and abdomen.   On exam pt w obvious hematoma to R forehead with small 1cm overlaying laceration. Patient with tenderness and swelling to R elbow. NVI. Some overlying 2cm laceration to posterior R elbow. Pt with R chest wall tenderness. Clear breath sounds in all lung fields. Pt with abdominal tenderness throughout.   Due to concern for fall on plavix will complete trauma pan scans. Trauma labs also sent.  Based on pt history and family witnesses story believe cause to be mechanical. Pt denied syncope, CP, SOB prior to fall. Imaging reviewed and significant only for R radial head impaction fracture with joint effusion. Faint lucency in the distal R radius which could represent a radial styloid fracture. Specifically no acute traumatic injuries to Head, cspine, chest, abdomen, pelvis. Patient elbow and forehead laceration repaired as above. Pt tolerated procedure well wo acute complication. Ortho tech applied splint to R arm in setting of fracture. Pt at baseline per family member at bedside.   Based on  the above findings, I believe patient is hemodynamically stable for discharge. Rx provided for short course of Norco for severe pain. Instructed to take tylenol 1g schedule for pain. Prescription provided for wheelchair as patient normally ambulates with walker.   Patient and family educated about return precautions. Patient and family educated about follow-up with PCP and [facility]. Patient and family expressed understanding of return precautions and need for follow-up. Patient discharged.   Emergency Department Medication Summary:  Medications  iohexol (OMNIPAQUE) 300 MG/ML solution 80 mL (80 mLs Intravenous Contrast Given 09/26/19 1942)  acetaminophen (TYLENOL) tablet 1,000 mg (1,000 mg Oral Given 09/27/19 0120)    Final Clinical Impression(s) / ED Diagnoses Final diagnoses:  Trauma  Closed nondisplaced fracture of styloid process of right radius, initial encounter  Fall, initial encounter  Closed nondisplaced fracture of head of right radius, initial encounter    Rx / DC Orders ED Discharge Orders         Ordered    Wheelchair     Discontinue  Reprint     09/27/19 0052    HYDROcodone-acetaminophen (NORCO/VICODIN) 5-325 MG tablet  Every 8 hours,   Status:  Discontinued     Reprint     09/27/19 0054    acetaminophen (TYLENOL) 500 MG tablet  Every 8 hours     Discontinue  Reprint     09/27/19 0057    HYDROcodone-acetaminophen (NORCO/VICODIN) 5-325 MG tablet  Every 8 hours PRN     Discontinue  Reprint     09/27/19 0057           Kennyth Lose, MD 09/27/19 1413    Elnora Morrison, MD 10/04/19 1919

## 2019-09-26 NOTE — Progress Notes (Signed)
Orthopedic Tech Progress Note Patient Details:  Dawn Steinbach [birth year] [id number] Level 2 not needed Patient ID: [name], female   DOB: [birth year], 86 y.o.   MRN: [id number]   Chip Boer 09/26/2019, 6:56 PM

## 2019-09-26 NOTE — ED Triage Notes (Signed)
Pt BIB [facility] from home after pt had mechanical fall walking down last step outside of home.   Pt had no LOC, Pt takes Plavix for AFIB.  Large Hematoma to R. Head. Abrasions to R. Elbow.   A&Ox4, GCS 15.   VSS with EMS.

## 2019-09-26 NOTE — Consult Note (Signed)
Responded to page, visited briefly with patient, who had no need for chaplain services at this time.   Rev. Eloise Levels' Chaplain

## 2019-09-27 DIAGNOSIS — S52514A Nondisplaced fracture of right radial styloid process, initial encounter for closed fracture: Secondary | ICD-10-CM | POA: Diagnosis not present

## 2019-09-27 LAB — LACTIC ACID, PLASMA: Lactic Acid, Venous: 1.5 mmol/L (ref 0.5–1.9)

## 2019-09-27 LAB — ETHANOL: Alcohol, Ethyl (B): <10 mg/dL (ref <10)

## 2019-09-27 MED ORDER — ACETAMINOPHEN 500 MG PO TABS
1000.0000 mg | ORAL_TABLET | Freq: Three times a day (TID) | ORAL | 0 refills | Status: AC
Start: 2019-09-27 — End: 2019-10-02

## 2019-09-27 MED ORDER — HYDROCODONE-ACETAMINOPHEN 5-325 MG PO TABS: 0.5000 | ORAL_TABLET | Freq: Three times a day (TID) | ORAL | 0 refills | Status: DC

## 2019-09-27 MED ORDER — ACETAMINOPHEN 500 MG PO TABS
1000.0000 mg | ORAL_TABLET | Freq: Once | ORAL | Status: AC
  Administered 2019-09-27: 1000 mg via ORAL
  Filled 2019-09-27: qty 2

## 2019-09-27 MED ORDER — HYDROCODONE-ACETAMINOPHEN 5-325 MG PO TABS: 0.5000 | ORAL_TABLET | Freq: Three times a day (TID) | ORAL | 0 refills | Status: AC | PRN

## 2019-09-27 NOTE — Discharge Instructions (Signed)
Please take tylenol 1 gram every 8 hours. Please alternate with 0.5 to 1 tablet of Norco for severe pain.

## 2019-09-27 NOTE — Progress Notes (Signed)
Orthopedic Tech Progress Note Patient Details:  Dawn Cooper 24-Nov-1918 [id number]  Ortho Devices Type of Ortho Device: Arm sling, Sugartong splint Ortho Device/Splint Location: rue Ortho Device/Splint Interventions: Ordered, Adjustment, Application   Post Interventions Patient Tolerated: Well Instructions Provided: Care of device, Adjustment of device   [name] 09/27/2019, 12:48 AM

## 2019-09-28 ENCOUNTER — Telehealth: Payer: Self-pay | Admitting: Family Medicine

## 2019-09-28 ENCOUNTER — Encounter: Payer: Self-pay | Admitting: Physician Assistant

## 2019-09-28 NOTE — Telephone Encounter (Signed)
ED visit without observation status does not qualify for TCM. Standard hfu only.

## 2019-09-28 NOTE — Telephone Encounter (Signed)
Pt daughter called stated that mother had fallen had to take her to hospital was released on Monday requesting appointment, scheduled her on 10-05-19

## 2019-09-28 NOTE — Telephone Encounter (Signed)
Noted  

## 2019-10-05 ENCOUNTER — Ambulatory Visit (INDEPENDENT_AMBULATORY_CARE_PROVIDER_SITE_OTHER): Payer: Medicare Other | Admitting: Family Medicine

## 2019-10-05 ENCOUNTER — Encounter: Payer: Self-pay | Admitting: Family Medicine

## 2019-10-05 ENCOUNTER — Other Ambulatory Visit: Payer: Self-pay

## 2019-10-05 DIAGNOSIS — S52101A Unspecified fracture of upper end of right radius, initial encounter for closed fracture: Secondary | ICD-10-CM

## 2019-10-05 DIAGNOSIS — I4891 Unspecified atrial fibrillation: Secondary | ICD-10-CM

## 2019-10-05 DIAGNOSIS — I255 Ischemic cardiomyopathy: Secondary | ICD-10-CM | POA: Diagnosis not present

## 2019-10-05 DIAGNOSIS — W19XXXA Unspecified fall, initial encounter: Secondary | ICD-10-CM

## 2019-10-05 DIAGNOSIS — K219 Gastro-esophageal reflux disease without esophagitis: Secondary | ICD-10-CM

## 2019-10-05 DIAGNOSIS — S5291XA Unspecified fracture of right forearm, initial encounter for closed fracture: Secondary | ICD-10-CM | POA: Insufficient documentation

## 2019-10-05 MED ORDER — CLOPIDOGREL BISULFATE 75 MG PO TABS: 75.0000 mg | ORAL_TABLET | Freq: Every day | ORAL | 1 refills | Status: DC

## 2019-10-05 NOTE — Progress Notes (Signed)
Tommi Rumps, MD Phone: [phone]  Dawn Cooper is a 86 y.o. female who presents today for f/u.  Atrial fibrillation: Taking Plavix daily.  Taking half a tablet of metoprolol daily as she reports she did not feel well when coming off of metoprolol completely.  No palpitations.  No bleeding issues.  GERD: Taking Protonix.  No reflux.  No abdominal pain.  No blood in her stool.  No dysphagia.  She had an EGD on 10/08/2017.  Fall: Patient was standing in a doorway when she had a fall on July 4.  She was evaluated in the emergency department and work-up revealed right radial fracture for which she was placed in a sugar tong splint.  She has an appointment with orthopedics later this week.  She had a large hematoma to her right forehead with a laceration on her right forehead as well as a laceration on her right elbow.  These were repaired with dissolvable sutures.  CT imaging of her head, neck, chest, abdomen and pelvis all revealed no acute changes.  She notes that she did not pass out.  She has had some discomfort in her right anterior lower ribs though has had no bruising in this area.  Social History   Tobacco Use  Smoking Status Never Smoker  Smokeless Tobacco Never Used     ROS see history of present illness  Objective  Physical Exam Vitals:   10/05/19 1135  BP: 100/60  Pulse: (!) 59  Temp: 98.4 F (36.9 C)  SpO2: 98%    BP Readings from Last 3 Encounters:  10/05/19 100/60  09/27/19 103/81  07/19/19 103/72   Wt Readings from Last 3 Encounters:  10/05/19 133 lb 6.4 oz (60.5 kg)  09/26/19 165 lb (74.8 kg)  07/19/19 136 lb (61.7 kg)    Physical Exam Constitutional:      General: She is not in acute distress.    Appearance: She is not diaphoretic.  HENT:     Head: No raccoon eyes.      Right Ear: Tympanic membrane normal. No hemotympanum.     Left Ear: Tympanic membrane normal. No hemotympanum.  Eyes:     Conjunctiva/sclera: Conjunctivae normal.      Pupils: Pupils are equal, round, and reactive to light.  Cardiovascular:     Rate and Rhythm: Normal rate. Rhythm irregularly irregular.  Pulmonary:     Effort: Pulmonary effort is normal.     Breath sounds: Normal breath sounds.  Chest:     Comments: [name], CMA served as chaperone, there is slight tenderness over her right anterior lower ribs with no bruising or bony defects noted Skin:    General: Skin is warm and dry.  Neurological:     Mental Status: She is alert.      Assessment/Plan: Please see individual problem list.  Fall Appears to been a mechanical fall.  She does have a radial fracture for which she will see orthopedics.  Her hematoma appears to have been improving.  Discussed it would take quite some time for that to resolve completely.  Laceration on her forehead has improved and healed well.  Right rib pain likely related to strain or soft tissue injury as her CT imaging did not reveal any rib fractures.  Encouraged use of wheelchair.  Discussed falls precautions.  Closed right radial fracture She will see orthopedics as planned.  She will remain in her sugar tong splint.  Discussed she could go to the [facility] walk[facility] clinic prior  to Friday if she desired.  GERD (gastroesophageal reflux disease) Stable.  Continue Protonix.  Atrial fibrillation (HCC) Stable.  Continue current regimen.  They will monitor blood pressures and heart rate on her metoprolol.    No orders of the defined types were placed in this encounter.   No orders of the defined types were placed in this encounter.   This visit occurred during the SARS-CoV-2 public health emergency.  Safety protocols were in place, including screening questions prior to the visit, additional usage of staff PPE, and extensive cleaning of exam room while observing appropriate contact time as indicated for disinfecting solutions.    Tommi Rumps, MD [place]

## 2019-10-05 NOTE — Assessment & Plan Note (Signed)
Stable ?Continue Protonix ?

## 2019-10-05 NOTE — Patient Instructions (Addendum)
Nice to see you. You could go to the [facility] urgent orthopedic clinic anytime between 1 and 7 PM this week prior to your appointment on Friday if you would like her seen sooner.  It is important to avoid accidents which may result in broken bones.  Here are a few ideas on how to make your home safer so you will be less likely to trip or fall.  1. Use nonskid mats or non slip strips in your shower or tub, on your bathroom floor and around sinks.  If you know that you have spilled water, wipe it up! 2. In the bathroom, it is important to have properly installed grab bars on the walls or on the edge of the tub.  Towel racks are NOT strong enough for you to hold onto or to pull on for support. 3. Stairs and hallways should have enough light.  Add lamps or night lights if you need ore light. 4. It is good to have handrails on both sides of the stairs if possible.  Always fix broken handrails right away. 5. It is important to see the edges of steps.  Paint the edges of outdoor steps white so you can see them better.  Put colored tape on the edge of inside steps. 6. Throw-rugs are dangerous because they can slide.  Removing the rugs is the best idea, but if they must stay, add adhesive carpet tape to prevent slipping. 7. Do not keep things on stairs or in the halls.  Remove small furniture that blocks the halls as it may cause you to trip.  Keep telephone and electrical cords out of the way where you walk. 8. Always were sturdy, rubber-soled shoes for good support.  Never wear just socks, especially on the stairs.  Socks may cause you to slip or fall.  Do not wear full-length housecoats as you can easily trip on the bottom.  9. Place the things you use the most on the shelves that are the easiest to reach.  If you use a stepstool, make sure it is in good condition.  If you feel unsteady, DO NOT climb, ask for help. 10. If a health professional advises you to use a cane or walker, do not be ashamed.   These items can keep you from falling and breaking your bones.

## 2019-10-05 NOTE — Assessment & Plan Note (Signed)
Stable.  Continue current regimen.  They will monitor blood pressures and heart rate on her metoprolol.

## 2019-10-05 NOTE — Assessment & Plan Note (Signed)
Appears to been a mechanical fall.  She does have a radial fracture for which she will see orthopedics.  Her hematoma appears to have been improving.  Discussed it would take quite some time for that to resolve completely.  Laceration on her forehead has improved and healed well.  Right rib pain likely related to strain or soft tissue injury as her CT imaging did not reveal any rib fractures.  Encouraged use of wheelchair.  Discussed falls precautions.

## 2019-10-05 NOTE — Assessment & Plan Note (Signed)
She will see orthopedics as planned.  She will remain in her sugar tong splint.  Discussed she could go to the [facility] walk[facility] clinic prior to Friday if she desired.

## 2019-10-08 DIAGNOSIS — S52124A Nondisplaced fracture of head of right radius, initial encounter for closed fracture: Secondary | ICD-10-CM | POA: Diagnosis not present

## 2019-10-14 ENCOUNTER — Ambulatory Visit (INDEPENDENT_AMBULATORY_CARE_PROVIDER_SITE_OTHER): Payer: Medicare Other

## 2019-10-14 ENCOUNTER — Other Ambulatory Visit: Payer: Self-pay

## 2019-10-14 DIAGNOSIS — E538 Deficiency of other specified B group vitamins: Secondary | ICD-10-CM | POA: Diagnosis not present

## 2019-10-14 DIAGNOSIS — I25118 Atherosclerotic heart disease of native coronary artery with other forms of angina pectoris: Secondary | ICD-10-CM

## 2019-10-14 MED ORDER — CYANOCOBALAMIN 1000 MCG/ML IJ SOLN
1000.0000 ug | Freq: Once | INTRAMUSCULAR | Status: AC
  Administered 2019-10-14: 1000 ug via INTRAMUSCULAR

## 2019-10-14 NOTE — Progress Notes (Addendum)
Patient presented for B 12 injection to left deltoid, patient voiced no concerns nor showed any signs of distress during injection. 

## 2019-10-27 DIAGNOSIS — S52124A Nondisplaced fracture of head of right radius, initial encounter for closed fracture: Secondary | ICD-10-CM | POA: Diagnosis not present

## 2019-11-08 ENCOUNTER — Telehealth: Payer: Self-pay | Admitting: Family Medicine

## 2019-11-08 MED ORDER — CLOPIDOGREL BISULFATE 75 MG PO TABS: 75.0000 mg | ORAL_TABLET | Freq: Every day | ORAL | 1 refills | Status: DC

## 2019-11-08 NOTE — Telephone Encounter (Signed)
Resent refill to [facility] and called [facility] to cancel on their end.

## 2019-11-08 NOTE — Telephone Encounter (Signed)
[facility] pharmacy is calling about getting new prescription for clopidogrel sent to them to refill for patient. They said they are unable to get [facility] to transfer this prescription to them and patient would like to have it filled with them since there is no copay.

## 2019-11-18 ENCOUNTER — Ambulatory Visit (INDEPENDENT_AMBULATORY_CARE_PROVIDER_SITE_OTHER): Payer: Medicare Other

## 2019-11-18 ENCOUNTER — Other Ambulatory Visit: Payer: Self-pay

## 2019-11-18 DIAGNOSIS — E538 Deficiency of other specified B group vitamins: Secondary | ICD-10-CM

## 2019-11-18 MED ORDER — CYANOCOBALAMIN 1000 MCG/ML IJ SOLN
1000.0000 ug | Freq: Once | INTRAMUSCULAR | Status: AC
  Administered 2019-11-18: 1000 ug via INTRAMUSCULAR

## 2019-11-18 NOTE — Progress Notes (Addendum)
Patient presented for B 12 injection to left deltoid, patient voiced no concerns nor showed any signs of distress during injection. 

## 2019-12-01 ENCOUNTER — Other Ambulatory Visit: Payer: Self-pay | Admitting: Cardiovascular Disease

## 2019-12-01 DIAGNOSIS — R609 Edema, unspecified: Secondary | ICD-10-CM

## 2019-12-22 ENCOUNTER — Ambulatory Visit (INDEPENDENT_AMBULATORY_CARE_PROVIDER_SITE_OTHER): Payer: Medicare Other

## 2019-12-22 ENCOUNTER — Other Ambulatory Visit: Payer: Self-pay

## 2019-12-22 DIAGNOSIS — Z23 Encounter for immunization: Secondary | ICD-10-CM

## 2019-12-22 DIAGNOSIS — E538 Deficiency of other specified B group vitamins: Secondary | ICD-10-CM | POA: Diagnosis not present

## 2019-12-22 MED ORDER — CYANOCOBALAMIN 1000 MCG/ML IJ SOLN
1000.0000 ug | Freq: Once | INTRAMUSCULAR | Status: AC
  Administered 2019-12-22: 1000 ug via INTRAMUSCULAR

## 2019-12-22 NOTE — Progress Notes (Signed)
Patient presented for B 12 injection to right deltoid, patient voiced no concerns nor showed any signs of distress during injection. 

## 2020-01-17 NOTE — Progress Notes (Signed)
Cardiology Office Note  Date:  01/18/2020   ID:  Dawn Cooper, DOB 12/16/1918, MRN [id number]  PCP:  Dawn Haven, MD   Chief Complaint  Patient presents with  . office visit    6 month F/U-Patient reports DOE and weakness; Meds verbally reviewed with patient's daughter.    HPI:  [name] is a very pleasant 86 year old woman with  severe aortic valve stenosis,   atrial fibrillation (not on anticoagulation secondary to GI bleed) ,  GI bleed from AVM,  stroke June 2016, documented on MRI,  Severe aortic valve stenosis who presents for follow-up of her aortic valve stenosis , chronic atrial fibrillation, hx of STEMI  Lives with daughter Last visit 06/2019, low blood pressure at that time Metoprolol held  Family reports that she did not fell well off metoprolol They restarted metoprolol 1/2 dose once a day, reports that she feels better Low blood pressure April 2021 also low blood pressure October 05, 2019  Drinks plenty of fluids  SOB getting in and out of the bed Does not go out very much, Feels things are relatively stable  Chronic leg weakness Uses a walker Most of the time spent in the house "weak in the knees" Chronic fatigue  Denies any recent TIA symptoms No significant leg swelling or worsening shortness of breath  Lasix every other day  EKG personally reviewed by myself on todays visit Atrial fibrillation rate 80 bpm left bundle branch block  Other past medical history reviewed Echo 10/2017 - Left ventricle: The cavity size was normal. There was mild   concentric hypertrophy. Systolic function was severely reduced.   The estimated ejection fraction was in the range of 25% to 30%.   Hypokinesis of the anterior myocardium. Hypokinesis of the   anteroseptal myocardium. Hypokinesis of the apical myocardium.   The study is not technically sufficient to allow evaluation of LV   diastolic function. - Aortic valve: There was severe stenosis. There was  mild   regurgitation. Peak velocity (S): 417 cm/s. Mean gradient (S): 40   mm Hg. Valve area (VTI): 0.35 cm^2. - Mitral valve: There was mild to moderate regurgitation. - Left atrium: The atrium was severely dilated.  Previous echocardiogram May 2018 with moderate to severe aortic valve stenosis  STEMI 08/09/2016  Ost 1st Diag lesion, 70 %stenosed.  A STENT SYNERGY DES 2.5X16 drug eluting stent was successfully placed.  Prox LAD lesion, 100 %stenosed.  Post intervention, there is a 0% residual stenosis.  1. Acute anterolateral STEMI secondary to thrombotic occlusion of known severe mid LAD and diagonal stenosis.  Successful PTCA/DES x 1 mid LAD  Echocardiogram September 23, 2016 LV function remains severely depressed with a large anterolateral/apical wall motion abnormality. The anteroseptal wall and apex are akinetic  Echo 08/10/2016 EF 25 to 30%  PMH:   has a past medical history of A-fib ([place]), Broken leg, Cardiomyopathy, Chronic kidney disease, Common bile duct stone, Duodenal ulceration, Gastrointestinal hemorrhage with hematemesis, GERD (gastroesophageal reflux disease), Heart murmur, Hiatal hernia, Hypertension, Mild mitral regurgitation by prior echocardiogram, Moderate aortic stenosis, Myocardial infarction [facility]), Stroke ([place]), and Tricuspid regurgitation.  PSH:    Past Surgical History:  Procedure Laterality Date  . APPENDECTOMY    . CATARACT EXTRACTION  2013   left  . CHOLECYSTECTOMY    . CORONARY ANGIOGRAPHY N/A 08/09/2016   Procedure: Coronary Angiography;  Surgeon: Burnell Blanks, MD;  Location: [facility];  Service: Cardiovascular;  Laterality: N/A;  . CORONARY ANGIOPLASTY    .  CORONARY ARTERY BYPASS GRAFT    . CORONARY STENT INTERVENTION N/A 08/09/2016   Procedure: Coronary Stent Intervention;  Surgeon: Burnell Blanks, MD;  Location: [facility];  Service: Cardiovascular;  Laterality: N/A;  . ERCP N/A 10/07/2017   Procedure: ENDOSCOPIC  RETROGRADE CHOLANGIOPANCREATOGRAPHY (ERCP);  Surgeon: Lucilla Lame, MD;  Location: [facility];  Service: Endoscopy;  Laterality: N/A;  . ESOPHAGOGASTRODUODENOSCOPY (EGD) WITH PROPOFOL N/A 10/08/2017   Procedure: ESOPHAGOGASTRODUODENOSCOPY (EGD) WITH PROPOFOL;  Surgeon: Lucilla Lame, MD;  Location: [facility] ENDOSCOPY;  Service: Endoscopy;  Laterality: N/A;  . EYE SURGERY    . FEMUR FRACTURE SURGERY  12/2011   Dr. Sabra Heck, s/p rehab at [facility]  . FRACTURE SURGERY    . GALLBLADDER SURGERY    . IR RADIOLOGY PERIPHERAL GUIDED IV START  07/29/2016  . IR US GUIDE VASC ACCESS RIGHT  07/29/2016  . VAGINAL DELIVERY      Current Outpatient Medications  Medication Sig Dispense Refill  . clopidogrel (PLAVIX) 75 MG tablet Take 1 tablet (75 mg total) by mouth daily. 90 tablet 1  . furosemide (LASIX) 20 MG tablet TAKE 1 TABLET EVERY OTHER DAY. 45 tablet 0  . nitroGLYCERIN (NITROSTAT) 0.4 MG SL tablet Place 1 tablet (0.4 mg total) under the tongue every 5 (five) minutes x 3 doses as needed for chest pain. 25 tablet 3  . pantoprazole (PROTONIX) 40 MG tablet TAKE 1 TABLET EVERY DAY 90 tablet 1  . potassium chloride (KLOR-CON) 10 MEQ tablet TAKE 1 TABLET EVERY OTHER DAY 45 tablet 3   No current facility-administered medications for this visit.     Allergies:   Dabigatran, Dabigatran etexilate mesylate, Clonidine, Clonidine derivatives, and Penicillins   Social History:  The patient  reports that she has never smoked. She has never used smokeless tobacco. She reports previous alcohol use. She reports that she does not use drugs.   Family History:   family history includes Cancer in her brother and sister; Diabetes in her brother and another family member; Stroke in her sister.    Review of Systems: Review of Systems  Constitutional: Negative.   HENT: Negative.   Respiratory: Negative.   Cardiovascular: Negative.   Gastrointestinal: Negative.   Musculoskeletal: Negative.        Leg weakness, gait  instability  Neurological: Negative.   Psychiatric/Behavioral: The patient has insomnia.   All other systems reviewed and are negative.   PHYSICAL EXAM: VS:  BP 120/80 (BP Location: Right Arm, Patient Position: Sitting, Cuff Size: Normal)   Pulse 80   Ht 4\' 11"  (1.499 m)   Wt 133 lb (60.3 kg)   SpO2 97%   BMI 26.86 kg/m  , BMI Body mass index is 26.86 kg/m. Constitutional:  oriented to person, place, and time. No distress.  HENT:  Head: Grossly normal Eyes:  no discharge. No scleral icterus.  Neck: No JVD, no carotid bruits  Cardiovascular: Regular rate and rhythm, 2-3/6 SEM RSB Pulmonary/Chest: Clear to auscultation bilaterally, no wheezes or rails Abdominal: Soft.  no distension.  no tenderness.  Musculoskeletal: Normal range of motion Neurological:  normal muscle tone. Coordination normal. No atrophy Skin: Skin warm and dry Psychiatric: normal affect, pleasant  Recent Labs: 09/26/2019: ALT 13; BUN 19; Creatinine, Ser 1.60; Hemoglobin 11.6; Platelets 163; Potassium 4.4; Sodium 140    Lipid Panel Lab Results  Component Value Date   CHOL 154 08/09/2016   HDL 33 (L) 08/09/2016   LDLCALC 102 (H) 08/09/2016   TRIG 94 08/09/2016  Wt Readings from Last 3 Encounters:  01/18/20 133 lb (60.3 kg)  10/05/19 133 lb 6.4 oz (60.5 kg)  09/26/19 165 lb (74.8 kg)     ASSESSMENT AND PLAN:  Chronic atrial fibrillation ([place]) -   history of strokes.    she has declined warfarin or other more aggressive anticoagulation for atrial fibrillation. Hx of GI bleed High fall risk  Cerebrovascular accident (CVA) due to embolism of precerebral artery ([place])  Documented on MRI.  High fall risk and patient has declined anticoagulation On plavix  Essential hypertension - Plan: EKG 12-Lead, ECHOCARDIOGRAM COMPLETE Blood pressure is well controlled on today's visit. No changes made to the medications.  Aortic valve stenosis, severe -  Currently asymptomatic, medical management per her  choice Very frail  CKD (chronic kidney disease), stage III  stable renal function, CR 1.6 Avoid NSAIDs Will avoid escalation of Lasix  STEMI Mid LAD stent, recovered well Currently with no symptoms of angina. No further workup at this time. Continue current medication regimen.   Total encounter time more than 25 minutes  Greater than 50% was spent in counseling and coordination of care with the patient   No orders of the defined types were placed in this encounter.    Signed, Esmond Plants, M.D., Ph.D. 01/18/2020  [place], [place]

## 2020-01-18 ENCOUNTER — Ambulatory Visit (INDEPENDENT_AMBULATORY_CARE_PROVIDER_SITE_OTHER): Payer: Medicare Other | Admitting: Cardiovascular Disease

## 2020-01-18 ENCOUNTER — Encounter: Payer: Self-pay | Admitting: Cardiovascular Disease

## 2020-01-18 ENCOUNTER — Other Ambulatory Visit: Payer: Self-pay

## 2020-01-18 VITALS — BP 120/80 | HR 80 | Ht 59.0 in | Wt 133.0 lb

## 2020-01-18 DIAGNOSIS — I4821 Permanent atrial fibrillation: Secondary | ICD-10-CM | POA: Diagnosis not present

## 2020-01-18 DIAGNOSIS — I255 Ischemic cardiomyopathy: Secondary | ICD-10-CM

## 2020-01-18 DIAGNOSIS — I2510 Atherosclerotic heart disease of native coronary artery without angina pectoris: Secondary | ICD-10-CM | POA: Diagnosis not present

## 2020-01-18 DIAGNOSIS — I5020 Unspecified systolic (congestive) heart failure: Secondary | ICD-10-CM | POA: Diagnosis not present

## 2020-01-18 DIAGNOSIS — I959 Hypotension, unspecified: Secondary | ICD-10-CM

## 2020-01-18 DIAGNOSIS — I350 Nonrheumatic aortic (valve) stenosis: Secondary | ICD-10-CM | POA: Diagnosis not present

## 2020-01-18 DIAGNOSIS — N183 Chronic kidney disease, stage 3 unspecified: Secondary | ICD-10-CM | POA: Diagnosis not present

## 2020-01-18 MED ORDER — METOPROLOL TARTRATE 25 MG PO TABS: 12.5000 mg | ORAL_TABLET | ORAL | 3 refills | Status: DC

## 2020-01-18 NOTE — Patient Instructions (Addendum)
Medication Instructions:  Metoprolol tartrate 12.5 mg in the Am Extra metoprolol 12.5 mg as needed in the PM  If you need a refill on your cardiac medications before your next appointment, please call your pharmacy.    Lab work: No new labs needed   If you have labs (blood work) drawn today and your tests are completely normal, you will receive your results only by: Marland Kitchen MyChart Message (if you have MyChart) OR . A paper copy in the mail If you have any lab test that is abnormal or we need to change your treatment, we will call you to review the results.   Testing/Procedures: No new testing needed   Follow-Up: At [facility], you and your health needs are our priority.  As part of our continuing mission to provide you with exceptional heart care, we have created designated Provider Care Teams.  These Care Teams include your primary Cardiologist (physician) and Advanced Practice Providers (APPs -  Physician Assistants and Nurse Practitioners) who all work together to provide you with the care you need, when you need it.  . You will need a follow up appointment in 6 months  . Providers on your designated Care Team:   . Murray Hodgkins, NP . Christell Faith, PA-C . Marrianne Mood, PA-C  Any Other Special Instructions Will Be Listed Below (If Applicable).  COVID-19 Vaccine Information can be found at: [email or web address] For questions related to vaccine distribution or appointments, please email [email or web address] or call [phone].

## 2020-01-19 ENCOUNTER — Other Ambulatory Visit: Payer: Self-pay | Admitting: Family Medicine

## 2020-01-26 ENCOUNTER — Other Ambulatory Visit: Payer: Self-pay

## 2020-01-26 ENCOUNTER — Ambulatory Visit (INDEPENDENT_AMBULATORY_CARE_PROVIDER_SITE_OTHER): Payer: Medicare Other | Admitting: *Deleted

## 2020-01-26 DIAGNOSIS — E538 Deficiency of other specified B group vitamins: Secondary | ICD-10-CM

## 2020-01-26 MED ORDER — CYANOCOBALAMIN 1000 MCG/ML IJ SOLN
1000.0000 ug | Freq: Once | INTRAMUSCULAR | Status: AC
  Administered 2020-01-26: 1000 ug via INTRAMUSCULAR

## 2020-01-26 NOTE — Progress Notes (Signed)
Patient presented for B 12 injection to left deltoid, patient voiced no concerns nor showed any signs of distress during injection. 

## 2020-02-07 ENCOUNTER — Other Ambulatory Visit: Payer: Self-pay | Admitting: Cardiovascular Disease

## 2020-02-07 DIAGNOSIS — R609 Edema, unspecified: Secondary | ICD-10-CM

## 2020-02-29 ENCOUNTER — Other Ambulatory Visit: Payer: Self-pay

## 2020-02-29 ENCOUNTER — Ambulatory Visit (INDEPENDENT_AMBULATORY_CARE_PROVIDER_SITE_OTHER): Payer: Medicare Other

## 2020-02-29 DIAGNOSIS — E538 Deficiency of other specified B group vitamins: Secondary | ICD-10-CM | POA: Diagnosis not present

## 2020-02-29 MED ORDER — CLOPIDOGREL BISULFATE 75 MG PO TABS
75.0000 mg | ORAL_TABLET | Freq: Every day | ORAL | 1 refills | Status: DC
Start: 2020-02-29 — End: 2020-07-10

## 2020-02-29 MED ORDER — CYANOCOBALAMIN 1000 MCG/ML IJ SOLN
1000.0000 ug | Freq: Once | INTRAMUSCULAR | Status: AC
  Administered 2020-02-29: 1000 ug via INTRAMUSCULAR

## 2020-02-29 NOTE — Progress Notes (Signed)
Patient presented for B 12 injection to right deltoid, patient voiced no concerns nor showed any signs of distress during injection. 

## 2020-03-23 ENCOUNTER — Ambulatory Visit (INDEPENDENT_AMBULATORY_CARE_PROVIDER_SITE_OTHER): Payer: Medicare Other

## 2020-03-23 VITALS — Ht 59.0 in | Wt 133.0 lb

## 2020-03-23 DIAGNOSIS — Z0000 Encounter for general adult medical examination without abnormal findings: Secondary | ICD-10-CM | POA: Diagnosis not present

## 2020-03-23 NOTE — Progress Notes (Addendum)
Subjective:   Dawn Cooper is a 86 y.o. female who presents for Medicare Annual (Subsequent) preventive examination.  Review of Systems    No ROS.  Medicare Wellness Virtual Visit.   Cardiac Risk Factors include: advanced age (>11men, >39 women);hypertension     Objective:    Today's Vitals   03/23/20 0934  Weight: 133 lb (60.3 kg)  Height: 4\' 11"  (1.499 m)   Body mass index is 26.86 kg/m.  Advanced Directives 03/23/2020 09/26/2019 03/23/2019 03/20/2018 10/08/2017 10/07/2017 02/19/2017  Does Patient Have a Medical Advance Directive? Yes No No No No No No  Type of Paramedic of [place];Living will - - - - - -  Does patient want to make changes to medical advance directive? No - Patient declined - - - - - -  Copy of [place] in Chart? No - copy requested - - - - - -  Would patient like information on creating a medical advance directive? - No - Patient declined Yes (MAU/Ambulatory/Procedural Areas - Information given) Yes (MAU/Ambulatory/Procedural Areas - Information given) No - Patient declined - Yes (MAU/Ambulatory/Procedural Areas - Information given)    Current Medications (verified) Outpatient Encounter Medications as of 03/23/2020  Medication Sig   clopidogrel (PLAVIX) 75 MG tablet Take 1 tablet (75 mg total) by mouth daily.   furosemide (LASIX) 20 MG tablet TAKE 1 TABLET EVERY OTHER DAY   metoprolol tartrate (LOPRESSOR) 25 MG tablet Take 0.5 tablets (12.5 mg total) by mouth as directed. Take half tablet (12.5 mg) in the AM with extra half tablet (12.5 mg) as needed in the PM   nitroGLYCERIN (NITROSTAT) 0.4 MG SL tablet Place 1 tablet (0.4 mg total) under the tongue every 5 (five) minutes x 3 doses as needed for chest pain.   pantoprazole (PROTONIX) 40 MG tablet TAKE 1 TABLET EVERY DAY   potassium chloride (KLOR-CON) 10 MEQ tablet TAKE 1 TABLET EVERY OTHER DAY   No facility-administered encounter medications on file as of  03/23/2020.    Allergies (verified) Dabigatran, Dabigatran etexilate mesylate, Clonidine, Clonidine derivatives, and Penicillins   History: Past Medical History:  Diagnosis Date   A-fib ([facility])    Broken leg    right   Cardiomyopathy    Chronic kidney disease    Common bile duct stone    Duodenal ulceration    Gastrointestinal hemorrhage with hematemesis    GERD (gastroesophageal reflux disease)    Heart murmur    Hiatal hernia    Hypertension    Mild mitral regurgitation by prior echocardiogram    Moderate aortic stenosis    Myocardial infarction [facility])    Stroke [facility])    Tricuspid regurgitation    Past Surgical History:  Procedure Laterality Date   APPENDECTOMY     CATARACT EXTRACTION  2013   left   CHOLECYSTECTOMY     CORONARY ANGIOGRAPHY N/A 08/09/2016   Procedure: Coronary Angiography;  Surgeon: Burnell Blanks, MD;  Location: [facility];  Service: Cardiovascular;  Laterality: N/A;   CORONARY ANGIOPLASTY     CORONARY ARTERY BYPASS GRAFT     CORONARY STENT INTERVENTION N/A 08/09/2016   Procedure: Coronary Stent Intervention;  Surgeon: Burnell Blanks, MD;  Location: [facility];  Service: Cardiovascular;  Laterality: N/A;   ERCP N/A 10/07/2017   Procedure: ENDOSCOPIC RETROGRADE CHOLANGIOPANCREATOGRAPHY (ERCP);  Surgeon: Lucilla Lame, MD;  Location: [facility] ENDOSCOPY;  Service: Endoscopy;  Laterality: N/A;   ESOPHAGOGASTRODUODENOSCOPY (EGD) WITH PROPOFOL N/A 10/08/2017  Procedure: ESOPHAGOGASTRODUODENOSCOPY (EGD) WITH PROPOFOL;  Surgeon: Lucilla Lame, MD;  Location: [facility] ENDOSCOPY;  Service: Endoscopy;  Laterality: N/A;   EYE SURGERY     FEMUR FRACTURE SURGERY  12/2011   Dr. Sabra Heck, s/p rehab at [place]     IR RADIOLOGY PERIPHERAL GUIDED IV START  07/29/2016   IR US GUIDE VASC ACCESS RIGHT  07/29/2016   VAGINAL DELIVERY     Family History  Problem Relation Age of Onset   Stroke Sister    Cancer  Brother        pancreatic   Cancer Sister        pancreatic   Diabetes Other    Diabetes Brother    Heart attack Neg Hx    Social History   Socioeconomic History   Marital status: Widowed    Spouse name: Not on file   Number of children: Not on file   Years of education: Not on file   Highest education level: Not on file  Occupational History    Comment: Retired  Tobacco Use   Smoking status: Never Smoker   Smokeless tobacco: Never Used  Scientific laboratory technician Use: Never used  Substance and Sexual Activity   Alcohol use: Not Currently   Drug use: Never   Sexual activity: Never  Other Topics Concern   Not on file  Social History Narrative   ** Merged History Encounter **       Lives in [place] alone. 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   Social Determinants of Health   Financial Resource Strain: Low Risk    Difficulty of Paying Living Expenses: Not hard at all  Food Insecurity: No Food Insecurity   Worried About Charity fundraiser in the Last Year: Never true   [place] in the Last Year: Never true  Transportation Needs: No Transportation Needs   Lack of Transportation (Medical): No   Lack of Transportation (Non-Medical): No  Physical Activity: Not on file  Stress: No Stress Concern Present   Feeling of Stress : Not at all  Social Connections: Unknown   Frequency of Communication with Friends and Family: More than three times a week   Frequency of Social Gatherings with Friends and Family: More than three times a week   Attends Religious Services: Not on Electrical engineer or Organizations: Not on file   Attends Archivist Meetings: Not on file   Marital Status: Not on file    Tobacco Counseling Counseling given: Not Answered   Clinical Intake:  Pre-visit preparation completed: Yes        Diabetes: No  How often do you need to have someone help you when you read instructions, pamphlets, or other written materials from your  doctor or pharmacy?: 1 - Never   Interpreter Needed?: No      Activities of Daily Living In your present state of health, do you have any difficulty performing the following activities: 03/23/2020  Hearing? N  Vision? N  Difficulty concentrating or making decisions? Y  Walking or climbing stairs? Y  Dressing or bathing? N  Doing errands, shopping? Y  Comment Does not drive or run errands alone  Preparing Food and eating ? Y  Comment Daughter manages  Using the Toilet? N  In the past six months, have you accidently leaked urine? N  Do you have problems with loss of bowel control?  N  Managing your Medications? Y  Comment Daughter manages  Managing your Finances? Y  Comment Daughter manages  Housekeeping or managing your Housekeeping? Y  Comment Daughter manages  Some recent data might be hidden    Patient Care Team: Leone Haven, MD as PCP - General (Family Medicine) Rockey Situ, Kathlene November, MD as PCP - Cardiology (Cardiology) Minna Merritts, MD as Consulting Physician (Cardiology) Lollie Sails, MD (Inactive) as Physician Assistant (Internal Medicine) Leone Haven, MD (Family Medicine)  Indicate any recent Medical Services you may have received from other than [facility] providers in the past year (date may be approximate).     Assessment:   This is a routine wellness examination for [facility].  I connected with Dawn Cooper today by telephone and verified that I am speaking with the correct person using two identifiers. Location patient: home Location provider: work Persons participating in the virtual visit: patient, daughter, Marine scientist.    I discussed the limitations, risks, security and privacy concerns of performing an evaluation and management service by telephone and the availability of in person appointments. The patient expressed understanding and verbally consented to this telephonic visit.    Interactive audio and video telecommunications were attempted  between this provider and patient, however failed, due to patient having technical difficulties OR patient did not have access to video capability.  We continued and completed visit with audio only.  Some vital signs may be absent or patient reported.   Hearing/Vision screen  Hearing Screening   125Hz  250Hz  500Hz  1000Hz  2000Hz  3000Hz  4000Hz  6000Hz  8000Hz   Right ear:           Left ear:           Comments: Patient is able to hear conversational tones without difficulty.  No issues reported.   Dietary issues and exercise activities discussed: Current Exercise Habits: Home exercise routine, Intensity: Mild  Healthy diet Good water intake  Goals      Maintain healthy lifestyle       Depression Screen PHQ 2/9 Scores 03/23/2020 10/05/2019 03/23/2019 03/20/2018 02/19/2017 02/20/2016 06/14/2013  PHQ - 2 Score 0 0 0 0 0 1 0  PHQ- 9 Score - - - - 1 - -    Fall Risk Fall Risk  03/23/2020 03/23/2019 03/20/2018 02/10/2018 02/19/2017  Falls in the past year? 0 0 0 0 No  Comment - - - Emmi Telephone Survey: data to providers prior to load -  Number falls in past yr: - - 0 - -  Injury with Fall? - - 0 - -  Follow up Falls evaluation completed Falls prevention discussed - - -    FALL RISK PREVENTION PERTAINING TO THE HOME: Handrails in use when climbing stairs? Yes Home free of loose throw rugs in walkways, pet beds, electrical cords, etc? Yes  Adequate lighting in your home to reduce risk of falls? Yes   ASSISTIVE DEVICES UTILIZED TO PREVENT FALLS: Life alert? No  Use of a cane, walker or w/c? Yes  Grab bars in the bathroom? No  Shower chair or bench in shower? No  Elevated toilet seat or a handicapped toilet? Yes   TIMED UP AND GO: Was the test performed? No . Virtual visit.   Cognitive Function: Unable to complete. Dementia.  Enjoys reading and word search puzzles.  MMSE - Mini Mental State Exam 03/23/2019 03/20/2018  Not completed: Unable to complete Unable to complete      6CIT Screen 02/19/2017 02/20/2016  What Year? 4 points  0 points  What month? 0 points 0 points  What time? 0 points 0 points  Count back from 20 0 points 0 points  Months in reverse 0 points 0 points  Repeat phrase 0 points -  Total Score 4 -    Immunizations Immunization History  Administered Date(s) Administered   Fluad Quad(high Dose 65+) 11/18/2018, 12/22/2019   Influenza, High Dose Seasonal PF 12/12/2015, 01/01/2017, 11/26/2017   Influenza,inj,Quad PF,6+ Mos 02/11/2013, 12/17/2013, 12/20/2014   Influenza-Unspecified 12/25/2011, 02/11/2013, 12/17/2013, 12/20/2014, 12/12/2015   Pneumococcal Conjugate-13 03/22/2014   Pneumococcal Polysaccharide-23 12/25/2011   Tdap 03/20/2018   Health Maintenance Health Maintenance  Topic Date Due   DEXA SCAN  Never done   TETANUS/TDAP  03/20/2028   INFLUENZA VACCINE  Completed   PNA vac Low Risk Adult  Completed   COVID-19 Vaccine  Discontinued   Colorectal cancer screening: No longer required.   Mammogram status: No longer required due to aged out..  Lung Cancer Screening: (Low Dose CT Chest recommended if Age 65-80 years, 30 pack-year currently smoking OR have quit w/in 15years.) does not qualify.   Hepatitis C Screening: does not qualify.  Vision Screening: Recommended annual ophthalmology exams for early detection of glaucoma and other disorders of the eye. Is the patient up to date with their annual eye exam?  Yes   Dental Screening: Recommended annual dental exams for proper oral hygiene.  Community Resource Referral / Chronic Care Management: CRR required this visit?  No   CCM required this visit?  No      Plan:   Keep all routine maintenance appointments.   Follow up 03/31/20 @ 11:00  I have personally reviewed and noted the following in the patient's chart:   Medical and social history Use of alcohol, tobacco or illicit drugs  Current medications and supplements Functional ability and status Nutritional  status Physical activity Advanced directives List of other physicians Hospitalizations, surgeries, and ER visits in previous 12 months Vitals Screenings to include cognitive, depression, and falls Referrals and appointments  In addition, I have reviewed and discussed with patient certain preventive protocols, quality metrics, and best practice recommendations. A written personalized care plan for preventive services as well as general preventive health recommendations were provided to patient via mychart.     OBrien-Blaney, Lanae Federer L, LPN   [id number]     I have reviewed the above information and agree with above.   Duncan Dull, MD

## 2020-03-23 NOTE — Patient Instructions (Addendum)
Dawn Cooper , Thank you for taking time to come for your Medicare Wellness Visit. I appreciate your ongoing commitment to your health goals. Please review the following plan we discussed and let me know if I can assist you in the future.   These are the goals we discussed: Goals     Maintain healthy lifestyle       This is a list of the screening recommended for you and due dates:  Health Maintenance  Topic Date Due   DEXA scan (bone density measurement)  Never done   Tetanus Vaccine  03/20/2028   Flu Shot  Completed   Pneumonia vaccines  Completed   COVID-19 Vaccine  Discontinued    Immunizations Immunization History  Administered Date(s) Administered   Fluad Quad(high Dose 65+) 11/18/2018, 12/22/2019   Influenza, High Dose Seasonal PF 12/12/2015, 01/01/2017, 11/26/2017   Influenza,inj,Quad PF,6+ Mos 02/11/2013, 12/17/2013, 12/20/2014   Influenza-Unspecified 12/25/2011, 02/11/2013, 12/17/2013, 12/20/2014, 12/12/2015   Pneumococcal Conjugate-13 03/22/2014   Pneumococcal Polysaccharide-23 12/25/2011   Tdap 03/20/2018   Advanced directives: End of life planning; Advance aging; Advanced directives discussed.  Copy of current HCPOA/Living Will requested.    Conditions/risks identified: none new.  Follow up in one year for your annual wellness visit.   Preventive Care 9 Years and Older, Female Preventive care refers to lifestyle choices and visits with your health care provider that can promote health and wellness. What does preventive care include?  A yearly physical exam. This is also called an annual well check.  Dental exams once or twice a year.  Routine eye exams. Ask your health care provider how often you should have your eyes checked.  Personal lifestyle choices, including:  Daily care of your teeth and gums.  Regular physical activity.  Eating a healthy diet.  Avoiding tobacco and drug use.  Limiting alcohol use.  Practicing safe  sex.  Taking low-dose aspirin every day.  Taking vitamin and mineral supplements as recommended by your health care provider. What happens during an annual well check? The services and screenings done by your health care provider during your annual well check will depend on your age, overall health, lifestyle risk factors, and family history of disease. Counseling  Your health care provider may ask you questions about your:  Alcohol use.  Tobacco use.  Drug use.  Emotional well-being.  Home and relationship well-being.  Sexual activity.  Eating habits.  History of falls.  Memory and ability to understand (cognition).  Work and work Statistician.  Reproductive health. Screening  You may have the following tests or measurements:  Height, weight, and BMI.  Blood pressure.  Lipid and cholesterol levels. These may be checked every 5 years, or more frequently if you are over 66 years old.  Skin check.  Lung cancer screening. You may have this screening every year starting at age 39 if you have a 30-pack-year history of smoking and currently smoke or have quit within the past 15 years.  Fecal occult blood test (FOBT) of the stool. You may have this test every year starting at age 59.  Flexible sigmoidoscopy or colonoscopy. You may have a sigmoidoscopy every 5 years or a colonoscopy every 10 years starting at age 37.  Hepatitis C blood test.  Hepatitis B blood test.  Sexually transmitted disease (STD) testing.  Diabetes screening. This is done by checking your blood sugar (glucose) after you have not eaten for a while (fasting). You may have this done every 1-3 years.  Bone  density scan. This is done to screen for osteoporosis. You may have this done starting at age 38.  Mammogram. This may be done every 1-2 years. Talk to your health care provider about how often you should have regular mammograms. Talk with your health care provider about your test results,  treatment options, and if necessary, the need for more tests. Vaccines  Your health care provider may recommend certain vaccines, such as:  Influenza vaccine. This is recommended every year.  Tetanus, diphtheria, and acellular pertussis (Tdap, Td) vaccine. You may need a Td booster every 10 years.  Zoster vaccine. You may need this after age 48.  Pneumococcal 13-valent conjugate (PCV13) vaccine. One dose is recommended after age 73.  Pneumococcal polysaccharide (PPSV23) vaccine. One dose is recommended after age 20. Talk to your health care provider about which screenings and vaccines you need and how often you need them. This information is not intended to replace advice given to you by your health care provider. Make sure you discuss any questions you have with your health care provider. Document Released: 04/07/2015 Document Revised: 11/29/2015 Document Reviewed: 01/10/2015 Elsevier Interactive Patient Education  2017 [place] Prevention in the Home Falls can cause injuries. They can happen to people of all ages. There are many things you can do to make your home safe and to help prevent falls. What can I do on the outside of my home?  Regularly fix the edges of walkways and driveways and fix any cracks.  Remove anything that might make you trip as you walk through a door, such as a raised step or threshold.  Trim any bushes or trees on the path to your home.  Use bright outdoor lighting.  Clear any walking paths of anything that might make someone trip, such as rocks or tools.  Regularly check to see if handrails are loose or broken. Make sure that both sides of any steps have handrails.  Any raised decks and porches should have guardrails on the edges.  Have any leaves, snow, or ice cleared regularly.  Use sand or salt on walking paths during winter.  Clean up any spills in your garage right away. This includes oil or grease spills. What can I do in the  bathroom?  Use night lights.  Install grab bars by the toilet and in the tub and shower. Do not use towel bars as grab bars.  Use non-skid mats or decals in the tub or shower.  If you need to sit down in the shower, use a plastic, non-slip stool.  Keep the floor dry. Clean up any water that spills on the floor as soon as it happens.  Remove soap buildup in the tub or shower regularly.  Attach bath mats securely with double-sided non-slip rug tape.  Do not have throw rugs and other things on the floor that can make you trip. What can I do in the bedroom?  Use night lights.  Make sure that you have a light by your bed that is easy to reach.  Do not use any sheets or blankets that are too big for your bed. They should not hang down onto the floor.  Have a firm chair that has side arms. You can use this for support while you get dressed.  Do not have throw rugs and other things on the floor that can make you trip. What can I do in the kitchen?  Clean up any spills right away.  Avoid walking on  wet floors.  Keep items that you use a lot in easy-to-reach places.  If you need to reach something above you, use a strong step stool that has a grab bar.  Keep electrical cords out of the way.  Do not use floor polish or wax that makes floors slippery. If you must use wax, use non-skid floor wax.  Do not have throw rugs and other things on the floor that can make you trip. What can I do with my stairs?  Do not leave any items on the stairs.  Make sure that there are handrails on both sides of the stairs and use them. Fix handrails that are broken or loose. Make sure that handrails are as long as the stairways.  Check any carpeting to make sure that it is firmly attached to the stairs. Fix any carpet that is loose or worn.  Avoid having throw rugs at the top or bottom of the stairs. If you do have throw rugs, attach them to the floor with carpet tape.  Make sure that you have a  light switch at the top of the stairs and the bottom of the stairs. If you do not have them, ask someone to add them for you. What else can I do to help prevent falls?  Wear shoes that:  Do not have high heels.  Have rubber bottoms.  Are comfortable and fit you well.  Are closed at the toe. Do not wear sandals.  If you use a stepladder:  Make sure that it is fully opened. Do not climb a closed stepladder.  Make sure that both sides of the stepladder are locked into place.  Ask someone to hold it for you, if possible.  Clearly mark and make sure that you can see:  Any grab bars or handrails.  First and last steps.  Where the edge of each step is.  Use tools that help you move around (mobility aids) if they are needed. These include:  Canes.  Walkers.  Scooters.  Crutches.  Turn on the lights when you go into a dark area. Replace any light bulbs as soon as they burn out.  Set up your furniture so you have a clear path. Avoid moving your furniture around.  If any of your floors are uneven, fix them.  If there are any pets around you, be aware of where they are.  Review your medicines with your doctor. Some medicines can make you feel dizzy. This can increase your chance of falling. Ask your doctor what other things that you can do to help prevent falls. This information is not intended to replace advice given to you by your health care provider. Make sure you discuss any questions you have with your health care provider. Document Released: 01/05/2009 Document Revised: 08/17/2015 Document Reviewed: 04/15/2014 Elsevier Interactive Patient Education  2017 [employer].

## 2020-03-31 ENCOUNTER — Ambulatory Visit: Payer: Medicare Other

## 2020-04-03 ENCOUNTER — Other Ambulatory Visit: Payer: Self-pay

## 2020-04-03 ENCOUNTER — Ambulatory Visit (INDEPENDENT_AMBULATORY_CARE_PROVIDER_SITE_OTHER): Payer: Medicare Other

## 2020-04-03 DIAGNOSIS — E538 Deficiency of other specified B group vitamins: Secondary | ICD-10-CM

## 2020-04-03 MED ORDER — CYANOCOBALAMIN 1000 MCG/ML IJ SOLN
1000.0000 ug | Freq: Once | INTRAMUSCULAR | Status: AC
  Administered 2020-04-03: 1000 ug via INTRAMUSCULAR

## 2020-04-03 NOTE — Progress Notes (Signed)
Patient presented for B 12 injection to right deltoid, patient voiced no concerns nor showed any signs of distress during injection. 

## 2020-04-25 ENCOUNTER — Other Ambulatory Visit: Payer: Self-pay | Admitting: Cardiovascular Disease

## 2020-05-05 ENCOUNTER — Other Ambulatory Visit: Payer: Self-pay

## 2020-05-05 ENCOUNTER — Ambulatory Visit: Payer: Medicare Other | Admitting: *Deleted

## 2020-05-05 DIAGNOSIS — E538 Deficiency of other specified B group vitamins: Secondary | ICD-10-CM

## 2020-05-05 MED ORDER — CYANOCOBALAMIN 1000 MCG/ML IJ SOLN: 1000.0000 ug | Freq: Once | INTRAMUSCULAR | Status: DC

## 2020-05-05 NOTE — Progress Notes (Signed)
Patient presented for B 12 injection to left deltoid, patient voiced no concerns nor showed any signs of distress during injection. 

## 2020-06-06 ENCOUNTER — Other Ambulatory Visit: Payer: Self-pay

## 2020-06-06 ENCOUNTER — Ambulatory Visit (INDEPENDENT_AMBULATORY_CARE_PROVIDER_SITE_OTHER): Payer: Medicare Other | Admitting: *Deleted

## 2020-06-06 DIAGNOSIS — E538 Deficiency of other specified B group vitamins: Secondary | ICD-10-CM

## 2020-06-06 MED ORDER — CYANOCOBALAMIN 1000 MCG/ML IJ SOLN
1000.0000 ug | Freq: Once | INTRAMUSCULAR | Status: AC
  Administered 2020-06-06: 1000 ug via INTRAMUSCULAR

## 2020-06-06 NOTE — Progress Notes (Signed)
Patient presented for B 12 injection to right deltoid, patient voiced no concerns nor showed any signs of distress during injection. 

## 2020-06-07 ENCOUNTER — Other Ambulatory Visit: Payer: Self-pay | Admitting: Family Medicine

## 2020-07-05 ENCOUNTER — Other Ambulatory Visit: Payer: Self-pay | Admitting: Cardiovascular Disease

## 2020-07-05 ENCOUNTER — Other Ambulatory Visit: Payer: Self-pay

## 2020-07-05 DIAGNOSIS — R609 Edema, unspecified: Secondary | ICD-10-CM

## 2020-07-05 MED ORDER — FUROSEMIDE 20 MG PO TABS: 20.0000 mg | ORAL_TABLET | ORAL | 0 refills | Status: DC

## 2020-07-10 ENCOUNTER — Other Ambulatory Visit: Payer: Self-pay | Admitting: Family Medicine

## 2020-07-11 ENCOUNTER — Ambulatory Visit (INDEPENDENT_AMBULATORY_CARE_PROVIDER_SITE_OTHER): Payer: Medicare Other

## 2020-07-11 ENCOUNTER — Other Ambulatory Visit: Payer: Self-pay

## 2020-07-11 DIAGNOSIS — E538 Deficiency of other specified B group vitamins: Secondary | ICD-10-CM

## 2020-07-11 MED ORDER — CYANOCOBALAMIN 1000 MCG/ML IJ SOLN
1000.0000 ug | Freq: Once | INTRAMUSCULAR | Status: AC
Start: 2020-07-11 — End: 2020-07-11
  Administered 2020-07-11: 1000 ug via INTRAMUSCULAR

## 2020-07-11 NOTE — Progress Notes (Signed)
Patient presented for B 12 injection to right deltoid, patient voiced no concerns nor showed any signs of distress during injection. 

## 2020-07-16 NOTE — Progress Notes (Signed)
Cardiology Office Note  Date:  07/17/2020   ID:  Dawn Cooper, DOB 1918/07/23, MRN [id number]  PCP:  Leone Haven, MD   Chief Complaint  Patient presents with  . 6 month follow up     Patient c/o LE edema in her left foot & shortness of breath with over exertion.       HPI:  Ms. Hobday is a very pleasant 87 year old woman with  severe aortic valve stenosis,   atrial fibrillation (not on anticoagulation secondary to GI bleed) ,  GI bleed from AVM,  stroke June 2016, documented on MRI,  who presents for follow-up of her aortic valve stenosis , chronic atrial fibrillation, hx of STEMI  LOV 12/2019  Lives with daughter Little bit of ankle swelling, stable SOB with walking or doing activities  Walks with walker Does not go out much, likes to sit on her porch Stays cold Poor sleep at baseline  Chronic leg weakness Chronic fatigue  Lasix QOD, has not had to take extra Cr 1.6, climbing when checked middle of 2021  Denies any recent TIA symptoms  EKG personally reviewed by myself on todays visit Atrial fibrillation rate 74 bpm left bundle branch block  Other past medical history reviewed 06/2019, low blood pressure at that time Metoprolol held  Family reports that she did not fell well off metoprolol They restarted metoprolol 1/2 dose once a day, reports that she feels better Low blood pressure April 2021 also low blood pressure October 05, 2019  Echo 10/2017 - Left ventricle: The cavity size was normal. There was mild   concentric hypertrophy. Systolic function was severely reduced.   The estimated ejection fraction was in the range of 25% to 30%.   Hypokinesis of the anterior myocardium. Hypokinesis of the   anteroseptal myocardium. Hypokinesis of the apical myocardium.   The study is not technically sufficient to allow evaluation of LV   diastolic function. - Aortic valve: There was severe stenosis. There was mild   regurgitation. Peak velocity (S): 417 cm/s.  Mean gradient (S): 40   mm Hg. Valve area (VTI): 0.35 cm^2. - Mitral valve: There was mild to moderate regurgitation. - Left atrium: The atrium was severely dilated.  Previous echocardiogram May 2018 with moderate to severe aortic valve stenosis  STEMI 08/09/2016  Ost 1st Diag lesion, 70 %stenosed.  A STENT SYNERGY DES 2.5X16 drug eluting stent was successfully placed.  Prox LAD lesion, 100 %stenosed.  Post intervention, there is a 0% residual stenosis.  1. Acute anterolateral STEMI secondary to thrombotic occlusion of known severe mid LAD and diagonal stenosis.  Successful PTCA/DES x 1 mid LAD  PMH:   has a past medical history of A-fib ([place]), Broken leg, Cardiomyopathy, Chronic kidney disease, Common bile duct stone, Duodenal ulceration, Gastrointestinal hemorrhage with hematemesis, GERD (gastroesophageal reflux disease), Heart murmur, Hiatal hernia, Hypertension, Mild mitral regurgitation by prior echocardiogram, Moderate aortic stenosis, Myocardial infarction [facility]), Stroke ([place]), and Tricuspid regurgitation.  PSH:    Past Surgical History:  Procedure Laterality Date  . APPENDECTOMY    . CATARACT EXTRACTION  2013   left  . CHOLECYSTECTOMY    . CORONARY ANGIOGRAPHY N/A 08/09/2016   Procedure: Coronary Angiography;  Surgeon: Burnell Blanks, MD;  Location: [place] [facility];  Service: Cardiovascular;  Laterality: N/A;  . CORONARY ANGIOPLASTY    . CORONARY ARTERY BYPASS GRAFT    . CORONARY STENT INTERVENTION N/A 08/09/2016   Procedure: Coronary Stent Intervention;  Surgeon: Burnell Blanks, MD;  Location: [place] [facility];  Service: Cardiovascular;  Laterality: N/A;  . ERCP N/A 10/07/2017   Procedure: ENDOSCOPIC RETROGRADE CHOLANGIOPANCREATOGRAPHY (ERCP);  Surgeon: Lucilla Lame, MD;  Location: [facility] ENDOSCOPY;  Service: Endoscopy;  Laterality: N/A;  . ESOPHAGOGASTRODUODENOSCOPY (EGD) WITH PROPOFOL N/A 10/08/2017   Procedure: ESOPHAGOGASTRODUODENOSCOPY (EGD) WITH  PROPOFOL;  Surgeon: Lucilla Lame, MD;  Location: [facility] ENDOSCOPY;  Service: Endoscopy;  Laterality: N/A;  . EYE SURGERY    . FEMUR FRACTURE SURGERY  12/2011   Dr. Sabra Heck, s/p rehab at [facility]  . FRACTURE SURGERY    . GALLBLADDER SURGERY    . IR RADIOLOGY PERIPHERAL GUIDED IV START  07/29/2016  . IR US GUIDE VASC ACCESS RIGHT  07/29/2016  . VAGINAL DELIVERY      Current Outpatient Medications  Medication Sig Dispense Refill  . clopidogrel (PLAVIX) 75 MG tablet TAKE 1 TABLET EVERY DAY 90 tablet 1  . furosemide (LASIX) 20 MG tablet Take 1 tablet (20 mg total) by mouth every other day. 45 tablet 0  . metoprolol tartrate (LOPRESSOR) 25 MG tablet Take 0.5 tablets (12.5 mg total) by mouth as directed. Take half tablet (12.5 mg) in the AM with extra half tablet (12.5 mg) as needed in the PM 90 tablet 3  . nitroGLYCERIN (NITROSTAT) 0.4 MG SL tablet Place 1 tablet (0.4 mg total) under the tongue every 5 (five) minutes x 3 doses as needed for chest pain. 25 tablet 3  . pantoprazole (PROTONIX) 40 MG tablet TAKE 1 TABLET EVERY DAY 90 tablet 1  . potassium chloride (KLOR-CON) 10 MEQ tablet TAKE 1 TABLET EVERY OTHER DAY 45 tablet 1   Current Facility-Administered Medications  Medication Dose Route Frequency Provider Last Rate Last Admin  . cyanocobalamin ((VITAMIN B-12)) injection 1,000 mcg  1,000 mcg Intramuscular Once Leone Haven, MD         Allergies:   Dabigatran, Dabigatran etexilate mesylate, Clonidine, Clonidine derivatives, and Penicillins   Social History:  The patient  reports that she has never smoked. She has never used smokeless tobacco. She reports previous alcohol use. She reports that she does not use drugs.   Family History:   family history includes Cancer in her brother and sister; Diabetes in her brother and another family member; Stroke in her sister.    Review of Systems: Review of Systems  Constitutional: Negative.   HENT: Negative.   Respiratory: Negative.    Cardiovascular: Negative.   Gastrointestinal: Negative.   Musculoskeletal: Negative.        Leg weakness, gait instability  Neurological: Negative.   Psychiatric/Behavioral: The patient has insomnia.   All other systems reviewed and are negative.   PHYSICAL EXAM: VS:  BP 120/82 (BP Location: Right Arm, Patient Position: Sitting, Cuff Size: Normal)   Pulse 74   Ht 4\' 11"  (1.499 m)   Wt 130 lb 8 oz (59.2 kg)   BMI 26.36 kg/m  , BMI Body mass index is 26.36 kg/m. Constitutional:  oriented to person, place, and time. No distress.  HENT:  Head: Grossly normal Eyes:  no discharge. No scleral icterus.  Neck: No JVD, no carotid bruits  Cardiovascular: Regular rate and rhythm, 3/6 SEM RSB Pulmonary/Chest: Clear to auscultation bilaterally, no wheezes or rails Abdominal: Soft.  no distension.  no tenderness.  Musculoskeletal: Normal range of motion Neurological:  normal muscle tone. Coordination normal. No atrophy Skin: Skin warm and dry Psychiatric: normal affect, pleasant  Recent Labs: 09/26/2019: ALT 13; BUN 19; Creatinine, Ser 1.60; Hemoglobin 11.6; Platelets  163; Potassium 4.4; Sodium 140    Lipid Panel Lab Results  Component Value Date   CHOL 154 08/09/2016   HDL 33 (L) 08/09/2016   LDLCALC 102 (H) 08/09/2016   TRIG 94 08/09/2016    Wt Readings from Last 3 Encounters:  07/17/20 130 lb 8 oz (59.2 kg)  03/23/20 133 lb (60.3 kg)  01/18/20 133 lb (60.3 kg)     ASSESSMENT AND PLAN:  Chronic atrial fibrillation (HCC) -   history of strokes.   No recent symptoms of TIA or new stroke  she has declined warfarin or other more aggressive anticoagulation for atrial fibrillation. Hx of GI bleed High fall risk and given her age we will hold off on anticoagulation  Cerebrovascular accident (CVA) due to embolism of precerebral artery ([place])  Documented on MRI.  High fall risk and patient has declined anticoagulation On plavix alone  Essential hypertension -  Blood pressure  is well controlled on today's visit. No changes made to the medications.  Aortic valve stenosis, severe -  Very frail Not a good candidate for surgical approaches Reports she is asymptomatic but does report shortness of breath on exertion likely secondary to deconditioning and her aortic valve Repeat imaging was offered  CKD (chronic kidney disease), stage III  stable renal function, CR 1.6 Avoid NSAIDs  Lasix QOD We have ordered repeat BMP today  STEMI Mid LAD stent Denies anginal symptoms but does have chronic shortness of breath Poor candidate for intervention    Total encounter time more than 25 minutes  Greater than 50% was spent in counseling and coordination of care with the patient   Orders Placed This Encounter  Procedures  . Comprehensive metabolic panel     Signed, Esmond Plants, M.D., Ph.D. 07/17/2020  [place], [place]

## 2020-07-17 ENCOUNTER — Other Ambulatory Visit: Payer: Self-pay

## 2020-07-17 ENCOUNTER — Encounter: Payer: Self-pay | Admitting: Cardiovascular Disease

## 2020-07-17 ENCOUNTER — Ambulatory Visit (INDEPENDENT_AMBULATORY_CARE_PROVIDER_SITE_OTHER): Payer: Medicare Other | Admitting: Cardiovascular Disease

## 2020-07-17 VITALS — BP 120/82 | HR 74 | Ht 59.0 in | Wt 130.5 lb

## 2020-07-17 DIAGNOSIS — I5020 Unspecified systolic (congestive) heart failure: Secondary | ICD-10-CM | POA: Diagnosis not present

## 2020-07-17 DIAGNOSIS — I2510 Atherosclerotic heart disease of native coronary artery without angina pectoris: Secondary | ICD-10-CM

## 2020-07-17 DIAGNOSIS — I4821 Permanent atrial fibrillation: Secondary | ICD-10-CM | POA: Diagnosis not present

## 2020-07-17 DIAGNOSIS — I350 Nonrheumatic aortic (valve) stenosis: Secondary | ICD-10-CM | POA: Diagnosis not present

## 2020-07-17 DIAGNOSIS — I959 Hypotension, unspecified: Secondary | ICD-10-CM

## 2020-07-17 DIAGNOSIS — N183 Chronic kidney disease, stage 3 unspecified: Secondary | ICD-10-CM

## 2020-07-17 DIAGNOSIS — I255 Ischemic cardiomyopathy: Secondary | ICD-10-CM

## 2020-07-17 DIAGNOSIS — Z8719 Personal history of other diseases of the digestive system: Secondary | ICD-10-CM | POA: Diagnosis not present

## 2020-07-17 NOTE — Patient Instructions (Addendum)
Medication Instructions:  No changes  If you need a refill on your cardiac medications before your next appointment, please call your pharmacy.    Lab work: CMP  We will call with abnormal results   Testing/Procedures: No new testing needed   Follow-Up:  . You will need a follow up appointment in 12 months  . Providers on your designated Care Team:   . Murray Hodgkins, NP . Christell Faith, PA-C . Marrianne Mood, PA-C  COVID-19 Vaccine Information can be found at: ShippingScam.co.uk For questions related to vaccine distribution or appointments, please email vaccine@[place] .com or call 4423242415.

## 2020-07-18 LAB — COMPREHENSIVE METABOLIC PANEL
ALT: 6 IU/L (ref 0–32)
AST: 16 IU/L (ref 0–40)
Albumin/Globulin Ratio: 1.3 (ref 1.2–2.2)
Albumin: 3.5 g/dL (ref 3.5–4.6)
Alkaline Phosphatase: 64 IU/L (ref 44–121)
BUN/Creatinine Ratio: 12 (ref 12–28)
BUN: 19 mg/dL (ref 10–36)
Bilirubin Total: 0.8 mg/dL (ref 0.0–1.2)
CO2: 21 mmol/L (ref 20–29)
Calcium: 9.3 mg/dL (ref 8.7–10.3)
Chloride: 106 mmol/L (ref 96–106)
Creatinine, Ser: 1.6 mg/dL — ABNORMAL HIGH (ref 0.57–1.00)
Globulin, Total: 2.6 g/dL (ref 1.5–4.5)
Glucose: 152 mg/dL — ABNORMAL HIGH (ref 65–99)
Potassium: 4.1 mmol/L (ref 3.5–5.2)
Sodium: 139 mmol/L (ref 134–144)
Total Protein: 6.1 g/dL (ref 6.0–8.5)
eGFR: 29 mL/min/{1.73_m2} — ABNORMAL LOW (ref >59)

## 2020-07-19 NOTE — Addendum Note (Signed)
Addended by: Raelene Bott, Shatavia Santor L on: 07/19/2020 12:30 PM   Modules accepted: Orders

## 2020-07-21 ENCOUNTER — Telehealth: Payer: Self-pay | Admitting: Cardiovascular Disease

## 2020-07-21 DIAGNOSIS — R609 Edema, unspecified: Secondary | ICD-10-CM

## 2020-07-21 MED ORDER — FUROSEMIDE 20 MG PO TABS: 20.0000 mg | ORAL_TABLET | ORAL | 0 refills | Status: DC

## 2020-07-21 NOTE — Telephone Encounter (Signed)
Daughter Lovey Newcomer calling says medication Furosemide 33mil gram went to wrong pharmacy needs to go to [facility]

## 2020-07-21 NOTE — Telephone Encounter (Signed)
Rx sent today will be placed on file and be sent to pt when it is time.

## 2020-07-21 NOTE — Telephone Encounter (Signed)
Requested Prescriptions   Signed Prescriptions Disp Refills   furosemide (LASIX) 20 MG tablet 45 tablet 0    Sig: Take 1 tablet (20 mg total) by mouth every other day.    Authorizing Provider: Minna Merritts    Ordering User: Raelene Bott, Camdon Saetern L

## 2020-07-21 NOTE — Telephone Encounter (Signed)
Patient calling states already received medication in mail. Please cancel order if possible

## 2020-07-31 ENCOUNTER — Other Ambulatory Visit: Payer: Self-pay | Admitting: Cardiovascular Disease

## 2020-07-31 NOTE — Telephone Encounter (Signed)
Rx request sent to pharmacy.  

## 2020-08-10 ENCOUNTER — Ambulatory Visit (INDEPENDENT_AMBULATORY_CARE_PROVIDER_SITE_OTHER): Payer: Medicare Other | Admitting: *Deleted

## 2020-08-10 ENCOUNTER — Other Ambulatory Visit: Payer: Self-pay

## 2020-08-10 DIAGNOSIS — E538 Deficiency of other specified B group vitamins: Secondary | ICD-10-CM | POA: Diagnosis not present

## 2020-08-10 MED ORDER — CYANOCOBALAMIN 1000 MCG/ML IJ SOLN
1000.0000 ug | Freq: Once | INTRAMUSCULAR | Status: AC
  Administered 2020-08-10: 1000 ug via INTRAMUSCULAR

## 2020-08-10 NOTE — Progress Notes (Signed)
Patient presented for B 12 injection to right deltoid, patient voiced no concerns nor showed any signs of distress during injection. 

## 2020-09-12 ENCOUNTER — Ambulatory Visit: Payer: Medicare Other

## 2020-09-19 ENCOUNTER — Ambulatory Visit (INDEPENDENT_AMBULATORY_CARE_PROVIDER_SITE_OTHER): Payer: Medicare Other

## 2020-09-19 ENCOUNTER — Other Ambulatory Visit: Payer: Self-pay

## 2020-09-19 DIAGNOSIS — E538 Deficiency of other specified B group vitamins: Secondary | ICD-10-CM | POA: Diagnosis not present

## 2020-09-19 MED ORDER — CYANOCOBALAMIN 1000 MCG/ML IJ SOLN
1000.0000 ug | Freq: Once | INTRAMUSCULAR | Status: AC
  Administered 2020-09-19: 1000 ug via INTRAMUSCULAR

## 2020-09-19 NOTE — Progress Notes (Addendum)
Patient presented for B 12 injection to right deltoid, patient voiced no concerns nor showed any signs of distress during injection. 

## 2020-09-20 ENCOUNTER — Other Ambulatory Visit: Payer: Self-pay | Admitting: Cardiovascular Disease

## 2020-10-19 ENCOUNTER — Ambulatory Visit (INDEPENDENT_AMBULATORY_CARE_PROVIDER_SITE_OTHER): Payer: Medicare Other | Admitting: *Deleted

## 2020-10-19 ENCOUNTER — Other Ambulatory Visit: Payer: Self-pay

## 2020-10-19 DIAGNOSIS — E538 Deficiency of other specified B group vitamins: Secondary | ICD-10-CM | POA: Diagnosis not present

## 2020-10-19 MED ORDER — CYANOCOBALAMIN 1000 MCG/ML IJ SOLN
1000.0000 ug | Freq: Once | INTRAMUSCULAR | Status: AC
  Administered 2020-10-19: 1000 ug via INTRAMUSCULAR

## 2020-10-19 NOTE — Progress Notes (Signed)
Patient presented for B 12 injection to left deltoid, patient voiced no concerns nor showed any signs of distress during injection. 

## 2020-10-30 ENCOUNTER — Other Ambulatory Visit: Payer: Self-pay | Admitting: Family Medicine

## 2020-10-30 NOTE — Telephone Encounter (Signed)
She needs an appointment scheduled for follow-up to consider refill of this medication.

## 2020-10-30 NOTE — Telephone Encounter (Signed)
Refilled: 06/07/2020 Last OV: 10/05/2019 Next OV: not scheduled

## 2020-11-16 NOTE — Telephone Encounter (Signed)
This patient needs a follow-up visit scheduled. This should have been scheduled before this was refilled per my prior note. Please have someone contact her to set up a follow-up visit. Thanks.

## 2020-11-20 NOTE — Telephone Encounter (Signed)
Spoke with pt's daughter and scheduled pt for the first available 30 minute appt on 01/03/2021. The daughter was hoping to be able to get pt in before then. She stated that the pt is weaker and having to use the wheelchair more often and not wanting to eat as much. There is several 15 minute appts I just wasn't sure if you would want to use one of those or just keep her on for 01/03/2021.

## 2020-11-21 ENCOUNTER — Other Ambulatory Visit: Payer: Self-pay

## 2020-11-21 ENCOUNTER — Ambulatory Visit (INDEPENDENT_AMBULATORY_CARE_PROVIDER_SITE_OTHER): Payer: Medicare Other

## 2020-11-21 DIAGNOSIS — E538 Deficiency of other specified B group vitamins: Secondary | ICD-10-CM

## 2020-11-21 MED ORDER — CYANOCOBALAMIN 1000 MCG/ML IJ SOLN
1000.0000 ug | Freq: Once | INTRAMUSCULAR | Status: AC
  Administered 2020-11-21: 1000 ug via INTRAMUSCULAR

## 2020-11-21 NOTE — Telephone Encounter (Signed)
It is ok to use a 15 minute slot.

## 2020-11-21 NOTE — Progress Notes (Signed)
Patient presented for B 12 injection to right deltoid, patient voiced no concerns nor showed any signs of distress during injection.   Pt was being driven to the office and their companion asked if we could come to the car to give the B12 injection. Pt was screened for covid and sick symptoms and stated she was negative for any cough, diarrhea, vomiting, headaches, nausea, or general discomfort. Pt states she was just tired and did not want to walk inside. Pt was verified by Name and Date of Birth and covid screened a second time at the car window. Pt reported negative and B12 injection was given to right deltoid with no complaints or concerns.

## 2020-11-22 NOTE — Telephone Encounter (Signed)
Spoke with pt's daughter and moved pt's appt to 12/18/2020.

## 2020-12-18 ENCOUNTER — Other Ambulatory Visit: Payer: Self-pay

## 2020-12-18 ENCOUNTER — Ambulatory Visit (INDEPENDENT_AMBULATORY_CARE_PROVIDER_SITE_OTHER): Payer: Medicare Other | Admitting: Family Medicine

## 2020-12-18 ENCOUNTER — Other Ambulatory Visit: Payer: Self-pay | Admitting: Family Medicine

## 2020-12-18 VITALS — BP 120/80 | HR 83 | Temp 97.5°F | Ht 59.0 in | Wt 113.4 lb

## 2020-12-18 DIAGNOSIS — F321 Major depressive disorder, single episode, moderate: Secondary | ICD-10-CM | POA: Insufficient documentation

## 2020-12-18 DIAGNOSIS — I4891 Unspecified atrial fibrillation: Secondary | ICD-10-CM | POA: Diagnosis not present

## 2020-12-18 DIAGNOSIS — R8290 Unspecified abnormal findings in urine: Secondary | ICD-10-CM

## 2020-12-18 DIAGNOSIS — R634 Abnormal weight loss: Secondary | ICD-10-CM

## 2020-12-18 DIAGNOSIS — M7062 Trochanteric bursitis, left hip: Secondary | ICD-10-CM | POA: Insufficient documentation

## 2020-12-18 DIAGNOSIS — E538 Deficiency of other specified B group vitamins: Secondary | ICD-10-CM

## 2020-12-18 DIAGNOSIS — I255 Ischemic cardiomyopathy: Secondary | ICD-10-CM | POA: Diagnosis not present

## 2020-12-18 DIAGNOSIS — N183 Chronic kidney disease, stage 3 unspecified: Secondary | ICD-10-CM | POA: Diagnosis not present

## 2020-12-18 DIAGNOSIS — K219 Gastro-esophageal reflux disease without esophagitis: Secondary | ICD-10-CM | POA: Diagnosis not present

## 2020-12-18 DIAGNOSIS — R531 Weakness: Secondary | ICD-10-CM

## 2020-12-18 DIAGNOSIS — Z23 Encounter for immunization: Secondary | ICD-10-CM | POA: Diagnosis not present

## 2020-12-18 LAB — POCT URINALYSIS DIPSTICK
Bilirubin, UA: NEGATIVE
Glucose, UA: NEGATIVE
Ketones, UA: NEGATIVE
Nitrite, UA: POSITIVE
Protein, UA: NEGATIVE
Spec Grav, UA: 1.02 (ref 1.010–1.025)
Urobilinogen, UA: 0.2 E.U./dL
pH, UA: 5 (ref 5.0–8.0)

## 2020-12-18 MED ORDER — CYANOCOBALAMIN 1000 MCG/ML IJ SOLN
1000.0000 ug | Freq: Once | INTRAMUSCULAR | Status: AC
  Administered 2020-12-18: 1000 ug via INTRAMUSCULAR

## 2020-12-18 NOTE — Assessment & Plan Note (Signed)
Check renal function. 

## 2020-12-18 NOTE — Assessment & Plan Note (Signed)
Check B12 

## 2020-12-18 NOTE — Assessment & Plan Note (Addendum)
She does not have any focal weakness or numbness.  This may be related to her depression or level of inactivity.  I encouraged physical therapy though she declines this.  We will obtain lab work to evaluate further.  Of note her weight has trended down.  I encouraged them to provide her with high-calorie foods.  Depression may be playing a role in the weight loss as well.

## 2020-12-18 NOTE — Addendum Note (Signed)
Addended by: Leeanne Rio on: 12/18/2020 06:31 PM   Modules accepted: Orders

## 2020-12-18 NOTE — Patient Instructions (Signed)
Nice to see you. Were going to get some lab work today to evaluate for cause of your symptoms. If you would like home health physical therapy for your hip and weakness please let me know. If you change your mind regarding medication for your depression or your sleep please let me know as well.

## 2020-12-18 NOTE — Assessment & Plan Note (Signed)
Patient has had ongoing depression.  I discussed management for medication with therapy though she declines both of these.  She opts to monitor.  I did discuss that depression could be contributing to a lot of her symptoms with regards to her weakness and lack of an appetite.  They will let us know if she changes her mind regarding treatment.

## 2020-12-18 NOTE — Assessment & Plan Note (Addendum)
Stable.  She will continue metoprolol 12.5 mg twice daily and Plavix 75 mg daily.

## 2020-12-18 NOTE — Assessment & Plan Note (Signed)
Asymptomatic.  Continue Protonix 40 mg daily.

## 2020-12-18 NOTE — Assessment & Plan Note (Signed)
Offered home health physical therapy though she declines.  She will let me know if she changes her mind.

## 2020-12-18 NOTE — Progress Notes (Signed)
Patient presented for B 12 injection to Right deltoid, patient voiced no concerns nor showed any signs of distress during injection.  Shahed Yeoman,cma  

## 2020-12-18 NOTE — Addendum Note (Signed)
Addended by: Leeanne Rio on: 12/18/2020 06:17 PM   Modules accepted: Orders

## 2020-12-18 NOTE — Progress Notes (Addendum)
Dawn Rumps, MD Phone: [phone]  Dawn Cooper is a 87 y.o. female who presents today for f/u.  A. fib: No palpitations.  She is currently on Plavix.  Hypertension: Taking Lasix and metoprolol.  No chest pain, shortness of breath, or edema.  Depression: The patient notes she has been depressed for a long time.  She has no SI.  She declines any medication or therapy for this.  GERD: Continues on Protonix.  No reflux, abdominal pain, blood in her stool, or dysphagia.  Left hip pain: Patient notes this has been bothering her for some time.  Typically bothers her when she lays on it.  No pain when she goes to stand up.  Weakness: Her family member reports some generalized weakness that has been going on over the last several weeks.  Noted she had some trouble standing up due to loss of energy.  She has been using a wheelchair to get around.  She has not been sleeping as well.  A few weeks ago she did not want to get out of bed.  They note that has been improving.  She has had decreased appetite.  No diarrhea, nausea, vomiting, or abdominal pain.  She gets 2 boost drinks per day and oftentimes has a waffle for breakfast.  She eats a variety of things later in the day.  Social History   Tobacco Use  Smoking Status Never  Smokeless Tobacco Never    Current Outpatient Medications on File Prior to Visit  Medication Sig Dispense Refill   clopidogrel (PLAVIX) 75 MG tablet TAKE 1 TABLET EVERY DAY 90 tablet 1   furosemide (LASIX) 20 MG tablet Take 1 tablet (20 mg total) by mouth every other day. 45 tablet 0   metoprolol tartrate (LOPRESSOR) 25 MG tablet TAKE 1/2 TABLET (12.5 MG) IN THE MORNING WITH EXTRA 1/2 TABLET (12.5 MG) AS NEEDED IN THE EVENING 90 tablet 3   nitroGLYCERIN (NITROSTAT) 0.4 MG SL tablet Place 1 tablet (0.4 mg total) under the tongue every 5 (five) minutes x 3 doses as needed for chest pain. 25 tablet 3   pantoprazole (PROTONIX) 40 MG tablet TAKE 1 TABLET EVERY DAY 90  tablet 1   potassium chloride (KLOR-CON) 10 MEQ tablet TAKE 1 TABLET EVERY OTHER DAY 45 tablet 3   No current facility-administered medications on file prior to visit.     ROS see history of present illness  Objective  Physical Exam Vitals:   12/18/20 1513  BP: 120/80  Pulse: 83  Temp: (!) 97.5 F (36.4 C)  SpO2: 97%    BP Readings from Last 3 Encounters:  12/18/20 120/80  07/17/20 120/82  01/18/20 120/80   Wt Readings from Last 3 Encounters:  12/18/20 113 lb 6.4 oz (51.4 kg)  07/17/20 130 lb 8 oz (59.2 kg)  03/23/20 133 lb (60.3 kg)    Physical Exam Constitutional:      General: She is not in acute distress.    Appearance: She is not diaphoretic.  Cardiovascular:     Rate and Rhythm: Normal rate and regular rhythm.     Heart sounds: Normal heart sounds.  Pulmonary:     Effort: Pulmonary effort is normal.     Breath sounds: Normal breath sounds.  Musculoskeletal:     Right lower leg: No edema.     Left lower leg: No edema.     Comments: Slight discomfort over the left greater trochanter on palpation, no discomfort over the right lateral thigh and hip  Skin:    General: Skin is warm and dry.  Neurological:     Mental Status: She is alert.     Comments: 5/5 strength in bilateral biceps, triceps, grip, quads, hamstrings, plantar and dorsiflexion, sensation to light touch intact in bilateral UE and LE     Assessment/Plan: Please see individual problem list.  Problem List Items Addressed This Visit     Atrial fibrillation ([place]) (Chronic)    Stable.  She will continue metoprolol 12.5 mg twice daily and Plavix 75 mg daily.      CKD (chronic kidney disease), stage III (HCC) (Chronic)    Check renal function.      Relevant Orders   Vitamin D (25 hydroxy)   POCT Urinalysis Dipstick   B12 deficiency    Check B12.      Relevant Orders   B12   Depression, major, single episode, moderate ([place])    Patient has had ongoing depression.  I discussed management  for medication with therapy though she declines both of these.  She opts to monitor.  I did discuss that depression could be contributing to a lot of her symptoms with regards to her weakness and lack of an appetite.  They will let us know if she changes her mind regarding treatment.      Generalized weakness    She does not have any focal weakness or numbness.  This may be related to her depression or level of inactivity.  I encouraged physical therapy though she declines this.  We will obtain lab work to evaluate further.  Of note her weight has trended down.  I encouraged them to provide her with high-calorie foods.  Depression may be playing a role in the weight loss as well.      Relevant Orders   Comp Met (CMET)   CBC w/Diff   TSH   POCT Urinalysis Dipstick   GERD (gastroesophageal reflux disease)    Asymptomatic.  Continue Protonix 40 mg daily.      Trochanteric bursitis of left hip    Offered home health physical therapy though she declines.  She will let me know if she changes her mind.      Other Visit Diagnoses     Weight loss    -  Primary   Relevant Orders   Comp Met (CMET)   CBC w/Diff   TSH   Vitamin D (25 hydroxy)   Sedimentation rate   POCT Urinalysis Dipstick   Need for immunization against influenza       Relevant Orders   Flu Vaccine QUAD High Dose(Fluad) (Completed)      Return in about 8 weeks (around 02/12/2021).  This visit occurred during the SARS-CoV-2 public health emergency.  Safety protocols were in place, including screening questions prior to the visit, additional usage of staff PPE, and extensive cleaning of exam room while observing appropriate contact time as indicated for disinfecting solutions.    Addendum: The patient has requested a likely transfer wheelchair.  Her current wheelchair is too heavy to transport.  An order will be placed for this.  Dawn Rumps, MD [place]

## 2020-12-19 LAB — SEDIMENTATION RATE: Sed Rate: 44 mm/hr — ABNORMAL HIGH (ref 0–30)

## 2020-12-19 LAB — COMPREHENSIVE METABOLIC PANEL
ALT: 12 U/L (ref 0–35)
AST: 27 U/L (ref 0–37)
Albumin: 3.2 g/dL — ABNORMAL LOW (ref 3.5–5.2)
Alkaline Phosphatase: 59 U/L (ref 39–117)
BUN: 44 mg/dL — ABNORMAL HIGH (ref 6–23)
CO2: 26 mEq/L (ref 19–32)
Calcium: 9.4 mg/dL (ref 8.4–10.5)
Chloride: 102 mEq/L (ref 96–112)
Creatinine, Ser: 1.23 mg/dL — ABNORMAL HIGH (ref 0.40–1.20)
GFR: 36.15 mL/min — ABNORMAL LOW (ref >60.00)
Glucose, Bld: 112 mg/dL — ABNORMAL HIGH (ref 70–99)
Potassium: 4.4 mEq/L (ref 3.5–5.1)
Sodium: 137 mEq/L (ref 135–145)
Total Bilirubin: 0.9 mg/dL (ref 0.2–1.2)
Total Protein: 6.7 g/dL (ref 6.0–8.3)

## 2020-12-19 LAB — CBC WITH DIFFERENTIAL/PLATELET
Basophils Absolute: 0 10*3/uL (ref 0.0–0.1)
Basophils Relative: 0.8 % (ref 0.0–3.0)
Eosinophils Absolute: 0.1 10*3/uL (ref 0.0–0.7)
Eosinophils Relative: 2.1 % (ref 0.0–5.0)
HCT: 37.2 % (ref 36.0–46.0)
Hemoglobin: 12.7 g/dL (ref 12.0–15.0)
Lymphocytes Relative: 32.8 % (ref 12.0–46.0)
Lymphs Abs: 1.9 10*3/uL (ref 0.7–4.0)
MCHC: 34.2 g/dL (ref 30.0–36.0)
MCV: 99.4 fl (ref 78.0–100.0)
Monocytes Absolute: 0.5 10*3/uL (ref 0.1–1.0)
Monocytes Relative: 7.9 % (ref 3.0–12.0)
Neutro Abs: 3.3 10*3/uL (ref 1.4–7.7)
Neutrophils Relative %: 56.4 % (ref 43.0–77.0)
Platelets: 190 10*3/uL (ref 150.0–400.0)
RBC: 3.74 Mil/uL — ABNORMAL LOW (ref 3.87–5.11)
RDW: 15 % (ref 11.5–15.5)
WBC: 5.9 10*3/uL (ref 4.0–10.5)

## 2020-12-19 LAB — TSH: TSH: 3.35 u[IU]/mL (ref 0.35–5.50)

## 2020-12-19 LAB — URINALYSIS, MICROSCOPIC ONLY
Casts: NONE SEEN /lpf
WBC, UA: >30 /hpf — AB (ref 0–5)

## 2020-12-19 LAB — VITAMIN D 25 HYDROXY (VIT D DEFICIENCY, FRACTURES): VITD: 50.95 ng/mL (ref 30.00–100.00)

## 2020-12-19 LAB — VITAMIN B12: Vitamin B-12: >1550 pg/mL — ABNORMAL HIGH (ref 211–911)

## 2020-12-21 ENCOUNTER — Other Ambulatory Visit: Payer: Self-pay

## 2020-12-21 DIAGNOSIS — R531 Weakness: Secondary | ICD-10-CM

## 2020-12-21 NOTE — Progress Notes (Signed)
Per  patient request a DME was made for a  lightweight transport wheelchair.  Dawn Cooper,cma

## 2020-12-21 NOTE — Progress Notes (Deleted)
Per  patient request a DME was made for a  lightweight transport wheelchair.  Lochlan Grygiel,cma

## 2020-12-22 ENCOUNTER — Other Ambulatory Visit: Payer: Self-pay | Admitting: Family Medicine

## 2020-12-22 ENCOUNTER — Ambulatory Visit: Payer: Medicare Other

## 2020-12-22 LAB — URINE CULTURE

## 2020-12-22 MED ORDER — SULFAMETHOXAZOLE-TRIMETHOPRIM 800-160 MG PO TABS: 1.0000 | ORAL_TABLET | Freq: Once | ORAL | 0 refills | Status: AC

## 2020-12-22 MED ORDER — SULFAMETHOXAZOLE-TRIMETHOPRIM 400-80 MG PO TABS: 1.0000 | ORAL_TABLET | Freq: Two times a day (BID) | ORAL | 0 refills | Status: AC

## 2020-12-26 ENCOUNTER — Telehealth: Payer: Self-pay

## 2020-12-27 ENCOUNTER — Other Ambulatory Visit: Payer: Self-pay | Admitting: Family Medicine

## 2020-12-27 DIAGNOSIS — R3129 Other microscopic hematuria: Secondary | ICD-10-CM

## 2020-12-27 NOTE — Telephone Encounter (Signed)
Note has been addended.

## 2020-12-28 NOTE — Telephone Encounter (Signed)
Today I faxed the order, insurance, demographics and office visit note to [facility] medical supply.  Confirmation given.  Tanisia Yokley,cma

## 2021-01-03 ENCOUNTER — Ambulatory Visit: Payer: PRIVATE HEALTH INSURANCE | Admitting: Family Medicine

## 2021-01-18 ENCOUNTER — Other Ambulatory Visit: Payer: Self-pay

## 2021-01-18 ENCOUNTER — Ambulatory Visit (INDEPENDENT_AMBULATORY_CARE_PROVIDER_SITE_OTHER): Payer: Medicare Other

## 2021-01-18 DIAGNOSIS — E538 Deficiency of other specified B group vitamins: Secondary | ICD-10-CM | POA: Diagnosis not present

## 2021-01-18 MED ORDER — CYANOCOBALAMIN 1000 MCG/ML IJ SOLN
1000.0000 ug | Freq: Once | INTRAMUSCULAR | Status: AC
  Administered 2021-01-18: 1000 ug via INTRAMUSCULAR

## 2021-01-18 NOTE — Progress Notes (Signed)
Patient presented for B 12 injection to left deltoid, patient voiced no concerns nor showed any signs of distress during injection. 

## 2021-01-29 ENCOUNTER — Other Ambulatory Visit: Payer: Self-pay

## 2021-01-29 ENCOUNTER — Other Ambulatory Visit (INDEPENDENT_AMBULATORY_CARE_PROVIDER_SITE_OTHER): Payer: Medicare Other

## 2021-01-29 DIAGNOSIS — R3129 Other microscopic hematuria: Secondary | ICD-10-CM | POA: Diagnosis not present

## 2021-01-29 LAB — URINALYSIS, MICROSCOPIC ONLY

## 2021-01-29 LAB — POCT URINALYSIS DIPSTICK
Bilirubin, UA: NEGATIVE
Glucose, UA: NEGATIVE
Ketones, UA: NEGATIVE
Nitrite, UA: NEGATIVE
Protein, UA: POSITIVE — AB
Spec Grav, UA: 1.025 (ref 1.010–1.025)
Urobilinogen, UA: 0.2 E.U./dL
pH, UA: 5.5 (ref 5.0–8.0)

## 2021-02-12 ENCOUNTER — Ambulatory Visit (INDEPENDENT_AMBULATORY_CARE_PROVIDER_SITE_OTHER): Payer: Medicare Other | Admitting: Family Medicine

## 2021-02-12 ENCOUNTER — Other Ambulatory Visit: Payer: Self-pay

## 2021-02-12 ENCOUNTER — Encounter: Payer: Self-pay | Admitting: Family Medicine

## 2021-02-12 DIAGNOSIS — R809 Proteinuria, unspecified: Secondary | ICD-10-CM | POA: Insufficient documentation

## 2021-02-12 DIAGNOSIS — I255 Ischemic cardiomyopathy: Secondary | ICD-10-CM | POA: Diagnosis not present

## 2021-02-12 DIAGNOSIS — F321 Major depressive disorder, single episode, moderate: Secondary | ICD-10-CM

## 2021-02-12 DIAGNOSIS — R531 Weakness: Secondary | ICD-10-CM

## 2021-02-12 NOTE — Patient Instructions (Addendum)
Nice to see you. If you change your mind regarding nice to CT imaging of your abdomen or chest x-ray or the urine testing for your protein please let me know. I think starting on a medicine called Remeron may be helpful for possible depression and could also help with your appetite.  If you would like to start on this please let me know.

## 2021-02-12 NOTE — Progress Notes (Signed)
Tommi Rumps, MD Phone: [phone]  Dawn Cooper is a 87 y.o. female who presents today for f/u.  Weight loss/loss of energy: Patient continues to have low energy levels.  She does not get up and do much.  She is not able to get around as well as she used to be.  She is not eating as much.  She does not have much of an appetite.  She has 2 boost drinks per day and occasionally has an egg.  Typically has half a banana sandwich, tomato soup, occasional vegetables, typically has 2 meals per day.  No nausea, vomiting, diarrhea, or abdominal pain.  Questionable depression.  No anxiety.  No SI.  No chest pain, shortness of breath, blood in her stool, hematuria, or vaginal bleeding.  She has a bowel movement every other day.  No new medications.  No new supplements.  She was previously treated for UTI to see if that would help with her symptoms though she reports no change.  Social History   Tobacco Use  Smoking Status Never  Smokeless Tobacco Never    Current Outpatient Medications on File Prior to Visit  Medication Sig Dispense Refill   clopidogrel (PLAVIX) 75 MG tablet TAKE 1 TABLET EVERY DAY 90 tablet 1   furosemide (LASIX) 20 MG tablet Take 1 tablet (20 mg total) by mouth every other day. 45 tablet 0   metoprolol tartrate (LOPRESSOR) 25 MG tablet TAKE 1/2 TABLET (12.5 MG) IN THE MORNING WITH EXTRA 1/2 TABLET (12.5 MG) AS NEEDED IN THE EVENING 90 tablet 3   nitroGLYCERIN (NITROSTAT) 0.4 MG SL tablet Place 1 tablet (0.4 mg total) under the tongue every 5 (five) minutes x 3 doses as needed for chest pain. 25 tablet 3   pantoprazole (PROTONIX) 40 MG tablet TAKE 1 TABLET EVERY DAY 90 tablet 1   potassium chloride (KLOR-CON) 10 MEQ tablet TAKE 1 TABLET EVERY OTHER DAY 45 tablet 3   No current facility-administered medications on file prior to visit.     ROS see history of present illness  Objective  Physical Exam Vitals:   02/12/21 1348  BP: 90/60  Pulse: 69  Temp: 97.6 F  (36.4 C)  SpO2: 98%    BP Readings from Last 3 Encounters:  02/12/21 90/60  12/18/20 120/80  07/17/20 120/82   Wt Readings from Last 3 Encounters:  02/12/21 110 lb 12.8 oz (50.3 kg)  12/18/20 113 lb 6.4 oz (51.4 kg)  07/17/20 130 lb 8 oz (59.2 kg)    Physical Exam Constitutional:      General: She is not in acute distress.    Appearance: She is not diaphoretic.  Cardiovascular:     Rate and Rhythm: Normal rate and regular rhythm.     Heart sounds: Normal heart sounds.  Pulmonary:     Effort: Pulmonary effort is normal.     Breath sounds: Normal breath sounds.  Abdominal:     General: Bowel sounds are normal. There is no distension.     Palpations: Abdomen is soft. There is no mass.     Tenderness: There is abdominal tenderness (Slight discomfort on palpation of the entire abdomen). There is no guarding or rebound.  Lymphadenopathy:     Cervical: No cervical adenopathy.     Upper Body:     Right upper body: No supraclavicular adenopathy.     Left upper body: No supraclavicular adenopathy.  Skin:    General: Skin is warm and dry.  Neurological:  Mental Status: She is alert.     Assessment/Plan: Please see individual problem list.  Problem List Items Addressed This Visit     Depression, major, single episode, moderate ([place])    Today the patient's PHQ-9 score was a 12 though her PHQ 2 score was 0.  I still think there may be some depression playing a role in her symptoms.  I discussed starting on medication for this though the patient is hesitant.  They will consider this and let me know within the next week.      Generalized weakness    Patient with generalized weakness and weight loss.  I suspect this might be related to depression and a PHQ-9 was completed today.  She does have some tenderness in her abdomen and in combination with her weight loss and lack of appetite I discussed that this is concerning to me.  I discussed obtaining a CT abdomen and pelvis though  she was unsure about this.  I had a discussion with the patient and her daughter regarding what her goals of care might be in working up this issue.  Discussed that if she was not going to undergo treatment for anything that we found on imaging then it may not be worthwhile undergoing the imaging.  Discussed treatment options could include surgery, radiation, chemotherapy, or other treatments.  Discussed potential causes for her abdominal tenderness, lack of appetite, and weight loss including cancer, gastric ulcer, other mass, infection.  I additionally discussed doing a chest x-ray to evaluate for any pulmonary causes for her symptoms.  The patient was unsure about any additional work-up at this time and they will consider this.  If they change their mind on further work-up she will let me know.      Proteinuria    Noted on recent urinalysis.  Possibly related to chronic kidney disease.  Discussed rechecking to help quantify this though the patient is unsure of doing this and does not think she can urinate today.  She will let us know if she changes her mind on this.  We can plan on checking this in 1 month at follow-up.      Return in about 1 month (around 03/14/2021) for Weight loss.  This visit occurred during the SARS-CoV-2 public health emergency.  Safety protocols were in place, including screening questions prior to the visit, additional usage of staff PPE, and extensive cleaning of exam room while observing appropriate contact time as indicated for disinfecting solutions.   I have spent 31 minutes in the care of this patient regarding history taking, documentation, completion of exam, discussion of potential work-up and goals of care.   Tommi Rumps, MD [place]

## 2021-02-12 NOTE — Assessment & Plan Note (Signed)
Noted on recent urinalysis.  Possibly related to chronic kidney disease.  Discussed rechecking to help quantify this though the patient is unsure of doing this and does not think she can urinate today.  She will let us know if she changes her mind on this.  We can plan on checking this in 1 month at follow-up.

## 2021-02-12 NOTE — Assessment & Plan Note (Signed)
Today the patient's PHQ-9 score was a 12 though her PHQ 2 score was 0.  I still think there may be some depression playing a role in her symptoms.  I discussed starting on medication for this though the patient is hesitant.  They will consider this and let me know within the next week.

## 2021-02-12 NOTE — Assessment & Plan Note (Signed)
Patient with generalized weakness and weight loss.  I suspect this might be related to depression and a PHQ-9 was completed today.  She does have some tenderness in her abdomen and in combination with her weight loss and lack of appetite I discussed that this is concerning to me.  I discussed obtaining a CT abdomen and pelvis though she was unsure about this.  I had a discussion with the patient and her daughter regarding what her goals of care might be in working up this issue.  Discussed that if she was not going to undergo treatment for anything that we found on imaging then it may not be worthwhile undergoing the imaging.  Discussed treatment options could include surgery, radiation, chemotherapy, or other treatments.  Discussed potential causes for her abdominal tenderness, lack of appetite, and weight loss including cancer, gastric ulcer, other mass, infection.  I additionally discussed doing a chest x-ray to evaluate for any pulmonary causes for her symptoms.  The patient was unsure about any additional work-up at this time and they will consider this.  If they change their mind on further work-up she will let me know.

## 2021-02-20 ENCOUNTER — Ambulatory Visit (INDEPENDENT_AMBULATORY_CARE_PROVIDER_SITE_OTHER): Payer: Medicare Other

## 2021-02-20 ENCOUNTER — Other Ambulatory Visit: Payer: Self-pay

## 2021-02-20 DIAGNOSIS — E538 Deficiency of other specified B group vitamins: Secondary | ICD-10-CM

## 2021-02-20 MED ORDER — CYANOCOBALAMIN 1000 MCG/ML IJ SOLN
1000.0000 ug | Freq: Once | INTRAMUSCULAR | Status: AC
  Administered 2021-02-20: 1000 ug via INTRAMUSCULAR

## 2021-02-20 NOTE — Progress Notes (Addendum)
Patient presented for B 12 injection to right deltoid, patient voiced no concerns nor showed any signs of distress during injection. 

## 2021-03-20 ENCOUNTER — Encounter: Payer: Self-pay | Admitting: Family Medicine

## 2021-03-20 ENCOUNTER — Other Ambulatory Visit: Payer: Self-pay

## 2021-03-20 ENCOUNTER — Ambulatory Visit (INDEPENDENT_AMBULATORY_CARE_PROVIDER_SITE_OTHER): Payer: Medicare Other | Admitting: Family Medicine

## 2021-03-20 VITALS — BP 120/70 | HR 80 | Temp 97.5°F | Ht 59.0 in | Wt 107.0 lb

## 2021-03-20 DIAGNOSIS — R21 Rash and other nonspecific skin eruption: Secondary | ICD-10-CM

## 2021-03-20 DIAGNOSIS — I255 Ischemic cardiomyopathy: Secondary | ICD-10-CM

## 2021-03-20 DIAGNOSIS — T148XXA Other injury of unspecified body region, initial encounter: Secondary | ICD-10-CM | POA: Diagnosis not present

## 2021-03-20 DIAGNOSIS — F321 Major depressive disorder, single episode, moderate: Secondary | ICD-10-CM

## 2021-03-20 DIAGNOSIS — E538 Deficiency of other specified B group vitamins: Secondary | ICD-10-CM | POA: Diagnosis not present

## 2021-03-20 DIAGNOSIS — F320 Major depressive disorder, single episode, mild: Secondary | ICD-10-CM | POA: Insufficient documentation

## 2021-03-20 DIAGNOSIS — R809 Proteinuria, unspecified: Secondary | ICD-10-CM

## 2021-03-20 DIAGNOSIS — R634 Abnormal weight loss: Secondary | ICD-10-CM | POA: Diagnosis not present

## 2021-03-20 LAB — COMPREHENSIVE METABOLIC PANEL
ALT: 9 U/L (ref 0–35)
AST: 26 U/L (ref 0–37)
Albumin: 3.2 g/dL — ABNORMAL LOW (ref 3.5–5.2)
Alkaline Phosphatase: 51 U/L (ref 39–117)
BUN: 45 mg/dL — ABNORMAL HIGH (ref 6–23)
CO2: 25 mEq/L (ref 19–32)
Calcium: 9.5 mg/dL (ref 8.4–10.5)
Chloride: 103 mEq/L (ref 96–112)
Creatinine, Ser: 1.38 mg/dL — ABNORMAL HIGH (ref 0.40–1.20)
GFR: 31.43 mL/min — ABNORMAL LOW (ref >60.00)
Glucose, Bld: 140 mg/dL — ABNORMAL HIGH (ref 70–99)
Potassium: 4.1 mEq/L (ref 3.5–5.1)
Sodium: 137 mEq/L (ref 135–145)
Total Bilirubin: 0.9 mg/dL (ref 0.2–1.2)
Total Protein: 6.5 g/dL (ref 6.0–8.3)

## 2021-03-20 MED ORDER — MIRTAZAPINE 7.5 MG PO TABS: 7.5000 mg | ORAL_TABLET | Freq: Every day | ORAL | 1 refills | Status: AC

## 2021-03-20 MED ORDER — TRIAMCINOLONE ACETONIDE 0.1 % EX CREA: 1.0000 "application " | TOPICAL_CREAM | Freq: Two times a day (BID) | CUTANEOUS | 0 refills | Status: AC

## 2021-03-20 MED ORDER — CYANOCOBALAMIN 1000 MCG/ML IJ SOLN
1000.0000 ug | Freq: Once | INTRAMUSCULAR | 0 refills | Status: DC
Start: 2021-03-20 — End: 2021-03-20

## 2021-03-20 MED ORDER — MIRTAZAPINE 7.5 MG PO TABS: 7.5000 mg | ORAL_TABLET | Freq: Every day | ORAL | 1 refills | Status: DC

## 2021-03-20 MED ORDER — CYANOCOBALAMIN 1000 MCG/ML IJ SOLN
1000.0000 ug | Freq: Once | INTRAMUSCULAR | Status: AC
  Administered 2021-03-20: 12:00:00 1000 ug via INTRAMUSCULAR

## 2021-03-20 NOTE — Assessment & Plan Note (Signed)
Concerning for possible psoriasis.  We will trial triamcinolone ointment to see if this is beneficial.  If not improving we could have her see dermatology.

## 2021-03-20 NOTE — Patient Instructions (Signed)
Nice to see you. You can eat what ever you want.  Please try to add in some real food. We will start you on mirtazapine to help with your depression as well as your appetite. The bruise on your arm should improve with time. Please think about getting the CT scan of your abdomen.  I think you need this given your appetite loss, tenderness on exam in your abdomen, and weight loss.  If you would like to have this done please let us know.  I will have somebody contact you in 1 week to ask about this.

## 2021-03-20 NOTE — Assessment & Plan Note (Signed)
The patient has lost a few more pounds.  Her appetite has not been good.  I advised that she continue the boost 3 times a day.  Discussed that she could eat anything she wants.  Discussed that depression may be playing a role in her weight loss and lack of an appetite.  Please see that issue for that plan.  Given her abdominal tenderness, weight loss, and lack of appetite I did advise that we get a CT scan of her abdomen to evaluate for a cause.  I advised that this would allow me to have a more in-depth conversation with them regarding possible treatment or work-up options.  The patient was unsure about having that done and noted she would think about it.  I will have somebody contact them in 1 week to check on this.  Message sent to [place] to do this.  The patient is aware that we could be missing something significant by not evaluating this issue further.

## 2021-03-20 NOTE — Assessment & Plan Note (Signed)
Check urine protein level today.

## 2021-03-20 NOTE — Assessment & Plan Note (Signed)
Related to an injury where she had her arm on a door jam.  Discussed this may take quite a while to improve.  She will monitor.

## 2021-03-20 NOTE — Assessment & Plan Note (Signed)
The patient has depression based on her report.  Discussed that this could be leading to her weight loss and lack of an appetite.  Discussed starting on mirtazapine once nightly as this would help with appetite and depression.  Discussed the risk of drowsiness with this medication.

## 2021-03-20 NOTE — Progress Notes (Signed)
Tommi Rumps, MD Phone: [phone]  Dawn Cooper is a 87 y.o. female who presents today for f/u.  Weight loss/depression: Patient notes her appetite has not been good.  Her daughter notes the only thing she will take in is a boost 3 times a day.  She is not eating much else otherwise.  Does not have an appetite for anything else.  They do offer her multiple other foods though she refuses them.  Notes no chest pain, shortness of breath, edema, cough, abdominal pain, nausea, vomiting, diarrhea, constipation, hematuria, blood in her stool, or anxiety.  She does have some depression.  No SI.  No urinary frequency, dysuria, or change to chronic urinary urgency.  Bruise: The patient's daughter notes that the patient hit her left arm on the door jam when her wheelchair got caught on the door jam.  This occurred about a week ago.  She had some discomfort initially though none since then.  Her arm did not get pinned.  Rash: This is on her posterior scalp.  They report this has been present for some time.  Her daughter wondered if this was related to psoriasis.  B12 deficiency: The patient is due for B12 injection.  Social History   Tobacco Use  Smoking Status Never  Smokeless Tobacco Never    Current Outpatient Medications on File Prior to Visit  Medication Sig Dispense Refill   clopidogrel (PLAVIX) 75 MG tablet TAKE 1 TABLET EVERY DAY 90 tablet 1   furosemide (LASIX) 20 MG tablet Take 1 tablet (20 mg total) by mouth every other day. 45 tablet 0   metoprolol tartrate (LOPRESSOR) 25 MG tablet TAKE 1/2 TABLET (12.5 MG) IN THE MORNING WITH EXTRA 1/2 TABLET (12.5 MG) AS NEEDED IN THE EVENING 90 tablet 3   nitroGLYCERIN (NITROSTAT) 0.4 MG SL tablet Place 1 tablet (0.4 mg total) under the tongue every 5 (five) minutes x 3 doses as needed for chest pain. 25 tablet 3   pantoprazole (PROTONIX) 40 MG tablet TAKE 1 TABLET EVERY DAY 90 tablet 1   potassium chloride (KLOR-CON) 10 MEQ tablet TAKE 1  TABLET EVERY OTHER DAY 45 tablet 3   No current facility-administered medications on file prior to visit.     ROS see history of present illness  Objective  Physical Exam Vitals:   03/20/21 1132 03/20/21 1335  BP: 120/70   Pulse: (!) 45 80  Temp: (!) 97.5 F (36.4 C)   SpO2: 98%     BP Readings from Last 3 Encounters:  03/20/21 120/70  02/12/21 90/60  12/18/20 120/80   Wt Readings from Last 3 Encounters:  03/20/21 107 lb (48.5 kg)  02/12/21 110 lb 12.8 oz (50.3 kg)  12/18/20 113 lb 6.4 oz (51.4 kg)    Physical Exam Constitutional:      General: She is not in acute distress.    Appearance: She is not diaphoretic.  Neck:   Cardiovascular:     Rate and Rhythm: Normal rate and regular rhythm.     Heart sounds: Normal heart sounds.  Pulmonary:     Effort: Pulmonary effort is normal.     Breath sounds: Normal breath sounds.  Abdominal:     General: Bowel sounds are normal. There is no distension.     Palpations: Abdomen is soft.     Tenderness: There is abdominal tenderness (Right mid and right upper quadrant). There is no guarding or rebound.  Skin:    General: Skin is warm and dry.  Comments: There is a large bruise over her left elbow laterally and her left dorsal forearm more proximally in the forearm, there is no bony tenderness of her elbow or forearm  Neurological:     Mental Status: She is alert.     Assessment/Plan: Please see individual problem list.  Problem List Items Addressed This Visit     B12 deficiency   Bruise    Related to an injury where she had her arm on a door jam.  Discussed this may take quite a while to improve.  She will monitor.      Depression, major, single episode, moderate ([place])    The patient has depression based on her report.  Discussed that this could be leading to her weight loss and lack of an appetite.  Discussed starting on mirtazapine once nightly as this would help with appetite and depression.  Discussed the risk  of drowsiness with this medication.      Relevant Medications   mirtazapine (REMERON) 7.5 MG tablet   Proteinuria - Primary    Check urine protein level today.      Relevant Orders   Protein / creatinine ratio, urine   Rash    Concerning for possible psoriasis.  We will trial triamcinolone ointment to see if this is beneficial.  If not improving we could have her see dermatology.      Relevant Medications   triamcinolone cream (KENALOG) 0.1 %   Weight loss    The patient has lost a few more pounds.  Her appetite has not been good.  I advised that she continue the boost 3 times a day.  Discussed that she could eat anything she wants.  Discussed that depression may be playing a role in her weight loss and lack of an appetite.  Please see that issue for that plan.  Given her abdominal tenderness, weight loss, and lack of appetite I did advise that we get a CT scan of her abdomen to evaluate for a cause.  I advised that this would allow me to have a more in-depth conversation with them regarding possible treatment or work-up options.  The patient was unsure about having that done and noted she would think about it.  I will have somebody contact them in 1 week to check on this.  Message sent to [place] to do this.  The patient is aware that we could be missing something significant by not evaluating this issue further.      Relevant Orders   Comp Met (CMET)     Return in about 4 weeks (around 04/17/2021) for Depression/weight loss.  This visit occurred during the SARS-CoV-2 public health emergency.  Safety protocols were in place, including screening questions prior to the visit, additional usage of staff PPE, and extensive cleaning of exam room while observing appropriate contact time as indicated for disinfecting solutions.    Tommi Rumps, MD [place]

## 2021-03-21 ENCOUNTER — Other Ambulatory Visit: Payer: Self-pay | Admitting: Family Medicine

## 2021-03-21 DIAGNOSIS — R809 Proteinuria, unspecified: Secondary | ICD-10-CM

## 2021-03-21 DIAGNOSIS — N1832 Chronic kidney disease, stage 3b: Secondary | ICD-10-CM

## 2021-03-21 LAB — PROTEIN / CREATININE RATIO, URINE
Creatinine, Urine: 131 mg/dL (ref 20–275)
Protein/Creat Ratio: 214 mg/g creat — ABNORMAL HIGH (ref 24–184)
Protein/Creatinine Ratio: 0.214 mg/mg creat — ABNORMAL HIGH (ref 0.024–0.184)
Total Protein, Urine: 28 mg/dL — ABNORMAL HIGH (ref 5–24)

## 2021-03-23 ENCOUNTER — Ambulatory Visit: Payer: PRIVATE HEALTH INSURANCE

## 2021-03-27 ENCOUNTER — Ambulatory Visit: Payer: Medicare Other

## 2021-04-20 ENCOUNTER — Other Ambulatory Visit: Payer: Self-pay

## 2021-04-20 ENCOUNTER — Other Ambulatory Visit: Payer: Medicare Other

## 2021-04-20 ENCOUNTER — Ambulatory Visit (INDEPENDENT_AMBULATORY_CARE_PROVIDER_SITE_OTHER): Payer: Medicare Other | Admitting: *Deleted

## 2021-04-20 DIAGNOSIS — E538 Deficiency of other specified B group vitamins: Secondary | ICD-10-CM | POA: Diagnosis not present

## 2021-04-20 MED ORDER — CYANOCOBALAMIN 1000 MCG/ML IJ SOLN
1000.0000 ug | Freq: Once | INTRAMUSCULAR | Status: AC
  Administered 2021-04-20: 1000 ug via INTRAMUSCULAR

## 2021-04-20 NOTE — Progress Notes (Signed)
Patient presented for B 12 injection to right deltoid, patient voiced no concerns nor showed any signs of distress during injection. 

## 2021-05-09 ENCOUNTER — Ambulatory Visit (INDEPENDENT_AMBULATORY_CARE_PROVIDER_SITE_OTHER): Payer: Medicare Other | Admitting: Family Medicine

## 2021-05-09 ENCOUNTER — Encounter: Payer: Self-pay | Admitting: Family Medicine

## 2021-05-09 ENCOUNTER — Other Ambulatory Visit: Payer: Self-pay

## 2021-05-09 DIAGNOSIS — F321 Major depressive disorder, single episode, moderate: Secondary | ICD-10-CM

## 2021-05-09 DIAGNOSIS — R634 Abnormal weight loss: Secondary | ICD-10-CM

## 2021-05-09 NOTE — Assessment & Plan Note (Signed)
The patient's weight has trended up.  I encouraged him to continue the 3 boost drinks per day and allow her to eat what ever she would like otherwise.  I again discussed getting a CT scan of her abdomen pelvis given her prior abdominal discomfort with reduced appetite and weight loss though she again declines this.  She understands the risk of missing a significant cause for her weight loss.  She will return in 2 months for another recheck.

## 2021-05-09 NOTE — Progress Notes (Signed)
Tommi Rumps, MD Phone: [phone]  Dawn Cooper is a 88 y.o. female who presents today for follow-up.  Weight loss: Patient's weight is up 4 pounds today.  They have been giving her 3 boost drinks per day.  She has been eating tomato soup and eggs.  She notes no significant change to her appetite.  She has had no abdominal pain.  She notes no diarrhea, vomiting, or nausea.  Depression: She denies any depression today.  She decided not to take the Remeron.  She notes she is sleeping well.  Social History   Tobacco Use  Smoking Status Never  Smokeless Tobacco Never    Current Outpatient Medications on File Prior to Visit  Medication Sig Dispense Refill   clopidogrel (PLAVIX) 75 MG tablet TAKE 1 TABLET EVERY DAY 90 tablet 1   furosemide (LASIX) 20 MG tablet Take 1 tablet (20 mg total) by mouth every other day. 45 tablet 0   metoprolol tartrate (LOPRESSOR) 25 MG tablet TAKE 1/2 TABLET (12.5 MG) IN THE MORNING WITH EXTRA 1/2 TABLET (12.5 MG) AS NEEDED IN THE EVENING 90 tablet 3   mirtazapine (REMERON) 7.5 MG tablet Take 1 tablet (7.5 mg total) by mouth at bedtime. 90 tablet 1   nitroGLYCERIN (NITROSTAT) 0.4 MG SL tablet Place 1 tablet (0.4 mg total) under the tongue every 5 (five) minutes x 3 doses as needed for chest pain. 25 tablet 3   pantoprazole (PROTONIX) 40 MG tablet TAKE 1 TABLET EVERY DAY 90 tablet 1   potassium chloride (KLOR-CON) 10 MEQ tablet TAKE 1 TABLET EVERY OTHER DAY 45 tablet 3   triamcinolone cream (KENALOG) 0.1 % Apply 1 application topically 2 (two) times daily. 30 g 0   No current facility-administered medications on file prior to visit.     ROS see history of present illness  Objective  Physical Exam Vitals:   05/09/21 1359  BP: 110/80  Pulse: 65  Temp: (!) 97.5 F (36.4 C)  SpO2: 98%    BP Readings from Last 3 Encounters:  05/09/21 110/80  03/20/21 120/70  02/12/21 90/60   Wt Readings from Last 3 Encounters:  05/09/21 111 lb (50.3 kg)   03/20/21 107 lb (48.5 kg)  02/12/21 110 lb 12.8 oz (50.3 kg)    Physical Exam Constitutional:      General: She is not in acute distress.    Appearance: She is not diaphoretic.  Cardiovascular:     Rate and Rhythm: Normal rate and regular rhythm.     Heart sounds: Normal heart sounds.  Pulmonary:     Effort: Pulmonary effort is normal.     Breath sounds: Normal breath sounds.  Abdominal:     General: Bowel sounds are normal. There is no distension.     Palpations: Abdomen is soft.     Tenderness: There is no abdominal tenderness.  Skin:    General: Skin is warm and dry.  Neurological:     Mental Status: She is alert.     Assessment/Plan: Please see individual problem list.  Problem List Items Addressed This Visit     Depression, major, single episode, moderate ([place])    Denies any depression today.  She will monitor for any recurrence.      Weight loss    The patient's weight has trended up.  I encouraged him to continue the 3 boost drinks per day and allow her to eat what ever she would like otherwise.  I again discussed getting a CT scan of  her abdomen pelvis given her prior abdominal discomfort with reduced appetite and weight loss though she again declines this.  She understands the risk of missing a significant cause for her weight loss.  She will return in 2 months for another recheck.        Return in about 2 months (around 07/07/2021) for Weight follow-up.  This visit occurred during the SARS-CoV-2 public health emergency.  Safety protocols were in place, including screening questions prior to the visit, additional usage of staff PPE, and extensive cleaning of exam room while observing appropriate contact time as indicated for disinfecting solutions.    Tommi Rumps, MD [place]

## 2021-05-09 NOTE — Assessment & Plan Note (Signed)
Denies any depression today.  She will monitor for any recurrence.

## 2021-05-09 NOTE — Patient Instructions (Signed)
Nice to see you. Please continue the 3 boost drinks per day.  You can eat what ever you want otherwise. If you start to have any recurrent abdominal pain or any depression please let us know.

## 2021-05-17 DIAGNOSIS — D631 Anemia in chronic kidney disease: Secondary | ICD-10-CM | POA: Diagnosis not present

## 2021-05-17 DIAGNOSIS — I129 Hypertensive chronic kidney disease with stage 1 through stage 4 chronic kidney disease, or unspecified chronic kidney disease: Secondary | ICD-10-CM | POA: Diagnosis not present

## 2021-05-17 DIAGNOSIS — R809 Proteinuria, unspecified: Secondary | ICD-10-CM | POA: Diagnosis not present

## 2021-05-17 DIAGNOSIS — N1832 Chronic kidney disease, stage 3b: Secondary | ICD-10-CM | POA: Diagnosis not present

## 2021-05-17 DIAGNOSIS — E876 Hypokalemia: Secondary | ICD-10-CM | POA: Diagnosis not present

## 2021-05-22 ENCOUNTER — Other Ambulatory Visit: Payer: Self-pay

## 2021-05-22 ENCOUNTER — Ambulatory Visit (INDEPENDENT_AMBULATORY_CARE_PROVIDER_SITE_OTHER): Payer: Medicare Other

## 2021-05-22 DIAGNOSIS — E538 Deficiency of other specified B group vitamins: Secondary | ICD-10-CM

## 2021-05-22 MED ORDER — CYANOCOBALAMIN 1000 MCG/ML IJ SOLN
1000.0000 ug | Freq: Once | INTRAMUSCULAR | Status: AC
  Administered 2021-05-22: 1000 ug via INTRAMUSCULAR

## 2021-05-22 NOTE — Progress Notes (Signed)
Patient presented for B 12 injection to left deltoid, patient voiced no concerns nor showed any signs of distress during injection. 

## 2021-05-23 DIAGNOSIS — I129 Hypertensive chronic kidney disease with stage 1 through stage 4 chronic kidney disease, or unspecified chronic kidney disease: Secondary | ICD-10-CM | POA: Diagnosis not present

## 2021-05-23 DIAGNOSIS — E876 Hypokalemia: Secondary | ICD-10-CM | POA: Diagnosis not present

## 2021-05-23 DIAGNOSIS — D631 Anemia in chronic kidney disease: Secondary | ICD-10-CM | POA: Diagnosis not present

## 2021-05-23 DIAGNOSIS — R809 Proteinuria, unspecified: Secondary | ICD-10-CM | POA: Diagnosis not present

## 2021-05-23 DIAGNOSIS — N1832 Chronic kidney disease, stage 3b: Secondary | ICD-10-CM | POA: Diagnosis not present

## 2021-05-30 ENCOUNTER — Telehealth: Payer: Self-pay | Admitting: Family Medicine

## 2021-05-30 DIAGNOSIS — I4891 Unspecified atrial fibrillation: Secondary | ICD-10-CM

## 2021-05-30 MED ORDER — CLOPIDOGREL BISULFATE 75 MG PO TABS: 75.0000 mg | ORAL_TABLET | Freq: Every day | ORAL | 1 refills | Status: DC

## 2021-05-30 MED ORDER — CLOPIDOGREL BISULFATE 75 MG PO TABS: 75.0000 mg | ORAL_TABLET | Freq: Every day | ORAL | 0 refills | Status: AC

## 2021-05-30 NOTE — Telephone Encounter (Signed)
Patient is rquesting a refill on her clopidogrel (PLAVIX) 75 MG tablet, patient took her last one this morning. Please send just this time to [facility] on [facility]. Furture refills go to Pharmacy mail order. ?

## 2021-05-30 NOTE — Addendum Note (Signed)
Addended by: [name] D on: 05/30/2021 04:43 PM ? ? Modules accepted: Orders ? ?

## 2021-06-21 ENCOUNTER — Ambulatory Visit (INDEPENDENT_AMBULATORY_CARE_PROVIDER_SITE_OTHER): Payer: Medicare Other

## 2021-06-21 DIAGNOSIS — E538 Deficiency of other specified B group vitamins: Secondary | ICD-10-CM

## 2021-06-21 MED ORDER — CYANOCOBALAMIN 1000 MCG/ML IJ SOLN
1000.0000 ug | Freq: Once | INTRAMUSCULAR | Status: AC
  Administered 2021-06-21: 1000 ug via INTRAMUSCULAR

## 2021-06-21 NOTE — Progress Notes (Signed)
Pt arrived for B12 injection, given in L deltoid. Pt tolerated injection well showed no signs of distress nor voiced any concerns.  ?

## 2021-07-10 ENCOUNTER — Encounter: Payer: Self-pay | Admitting: Family Medicine

## 2021-07-10 ENCOUNTER — Ambulatory Visit (INDEPENDENT_AMBULATORY_CARE_PROVIDER_SITE_OTHER): Payer: Medicare Other | Admitting: Family Medicine

## 2021-07-10 VITALS — BP 120/80 | HR 63 | Temp 97.5°F | Ht 59.0 in | Wt 102.4 lb

## 2021-07-10 DIAGNOSIS — F321 Major depressive disorder, single episode, moderate: Secondary | ICD-10-CM | POA: Diagnosis not present

## 2021-07-10 DIAGNOSIS — R634 Abnormal weight loss: Secondary | ICD-10-CM | POA: Diagnosis not present

## 2021-07-10 DIAGNOSIS — F0390 Unspecified dementia without behavioral disturbance: Secondary | ICD-10-CM | POA: Diagnosis not present

## 2021-07-10 LAB — CBC WITH DIFFERENTIAL/PLATELET
Basophils Absolute: 0 10*3/uL (ref 0.0–0.1)
Basophils Relative: 0.6 % (ref 0.0–3.0)
Eosinophils Absolute: 0.1 10*3/uL (ref 0.0–0.7)
Eosinophils Relative: 1.4 % (ref 0.0–5.0)
HCT: 37.6 % (ref 36.0–46.0)
Hemoglobin: 12.8 g/dL (ref 12.0–15.0)
Lymphocytes Relative: 27.7 % (ref 12.0–46.0)
Lymphs Abs: 1.3 10*3/uL (ref 0.7–4.0)
MCHC: 34 g/dL (ref 30.0–36.0)
MCV: 100 fl (ref 78.0–100.0)
Monocytes Absolute: 0.3 10*3/uL (ref 0.1–1.0)
Monocytes Relative: 5.7 % (ref 3.0–12.0)
Neutro Abs: 3 10*3/uL (ref 1.4–7.7)
Neutrophils Relative %: 64.6 % (ref 43.0–77.0)
Platelets: 139 10*3/uL — ABNORMAL LOW (ref 150.0–400.0)
RBC: 3.76 Mil/uL — ABNORMAL LOW (ref 3.87–5.11)
RDW: 13.8 % (ref 11.5–15.5)
WBC: 4.7 10*3/uL (ref 4.0–10.5)

## 2021-07-10 LAB — COMPREHENSIVE METABOLIC PANEL
ALT: 12 U/L (ref 0–35)
AST: 26 U/L (ref 0–37)
Albumin: 3.2 g/dL — ABNORMAL LOW (ref 3.5–5.2)
Alkaline Phosphatase: 53 U/L (ref 39–117)
BUN: 50 mg/dL — ABNORMAL HIGH (ref 6–23)
CO2: 28 mEq/L (ref 19–32)
Calcium: 9.6 mg/dL (ref 8.4–10.5)
Chloride: 104 mEq/L (ref 96–112)
Creatinine, Ser: 1.38 mg/dL — ABNORMAL HIGH (ref 0.40–1.20)
GFR: 31.36 mL/min — ABNORMAL LOW (ref >60.00)
Glucose, Bld: 166 mg/dL — ABNORMAL HIGH (ref 70–99)
Potassium: 4.5 mEq/L (ref 3.5–5.1)
Sodium: 138 mEq/L (ref 135–145)
Total Bilirubin: 0.7 mg/dL (ref 0.2–1.2)
Total Protein: 6.8 g/dL (ref 6.0–8.3)

## 2021-07-10 LAB — SEDIMENTATION RATE: Sed Rate: 35 mm/hr — ABNORMAL HIGH (ref 0–30)

## 2021-07-10 LAB — TSH: TSH: 2.42 u[IU]/mL (ref 0.35–5.50)

## 2021-07-10 NOTE — Patient Instructions (Signed)
Nice to see you. ?We will check lab work today. ?I would like to get a CT scan of your abdomen to evaluate for cause of your symptoms.  If you change your mind on this please let us know. ?I would also recommend having palliative care or hospice come into the home to see what kind of services they can provide to you.  If you change your mind on that as well please let us know. ?Please start on the mirtazapine 7.5 mg once daily.  If you notice any side effects with this please let me know. ?

## 2021-07-10 NOTE — Assessment & Plan Note (Signed)
Patient's weight has trended down again.  She has had continued intermittent abdominal pain with decreased appetite and weight loss.  I discussed my concern regarding this and that I recommended a CT abdomen and pelvis to evaluate for potential underlying cause.  Discussed that there could be a cancer or some other lesion that is contributing to her symptoms that could potentially be treated or at least guide possible treatment.  The patient had difficulty understanding this with her dementia though her daughter verbalized understanding of this.  The daughter noted that she would be on board with getting the CT scan though she noted she needed to discuss further with the patient to make sure she was okay with this.  We will get blood work today to look for possible causes of her weight loss.  I discussed the potential of having hospice or palliative care involved though the daughter expressed that she was unsure of what they would actually be able to do for the patient.  I discussed that if the patient continues to not eat well and does not have any work-up it would be worthwhile having palliative care involved to help with symptom management.  Discussed the risk of death without further work-up of this.  I encouraged high calorie food intake. ?

## 2021-07-10 NOTE — Progress Notes (Signed)
?Tommi Rumps, MD ?Phone: [phone] ? ?Dawn Cooper is a 88 y.o. female who presents today for follow-up. ? ?Weight loss/abdominal pain: Patient presents with her daughter who gives most of the history.  The patient's weight has trended down since her last visit.  They note her appetite has not been good.  Its difficult to get her to eat anything other than her Ensure drinks.  She is taking in 4 of those daily.  She occasionally has epigastric stomach pain.  No nausea.  No blood in her stool.  No diarrhea.  No vomiting.  No cough.  No shortness of breath.  No fevers.  They report she has been sleeping a lot over the last few weeks.  She is awake more at night.  She does have some depression.  No SI.  She never started on the mirtazapine given fear of side effect risk. ? ?Social History  ? ?Tobacco Use  ?Smoking Status Never  ?Smokeless Tobacco Never  ? ? ?Current Outpatient Medications on File Prior to Visit  ?Medication Sig Dispense Refill  ? clopidogrel (PLAVIX) 75 MG tablet Take 1 tablet (75 mg total) by mouth daily. 90 tablet 0  ? cyanocobalamin (,VITAMIN B-12,) 1000 MCG/ML injection     ? furosemide (LASIX) 20 MG tablet Take 1 tablet (20 mg total) by mouth every other day. 45 tablet 0  ? metoprolol tartrate (LOPRESSOR) 25 MG tablet TAKE 1/2 TABLET (12.5 MG) IN THE MORNING WITH EXTRA 1/2 TABLET (12.5 MG) AS NEEDED IN THE EVENING 90 tablet 3  ? mirtazapine (REMERON) 7.5 MG tablet Take 1 tablet (7.5 mg total) by mouth at bedtime. 90 tablet 1  ? nitroGLYCERIN (NITROSTAT) 0.4 MG SL tablet Place 1 tablet (0.4 mg total) under the tongue every 5 (five) minutes x 3 doses as needed for chest pain. 25 tablet 3  ? pantoprazole (PROTONIX) 40 MG tablet TAKE 1 TABLET EVERY DAY 90 tablet 1  ? potassium chloride (KLOR-CON) 10 MEQ tablet TAKE 1 TABLET EVERY OTHER DAY 45 tablet 3  ? triamcinolone cream (KENALOG) 0.1 % Apply 1 application topically 2 (two) times daily. 30 g 0  ? ?No current facility-administered  medications on file prior to visit.  ? ? ? ?ROS see history of present illness ? ?Objective ? ?Physical Exam ?Vitals:  ? 07/10/21 1214  ?BP: 120/80  ?Pulse: 63  ?Temp: (!) 97.5 ?F (36.4 ?C)  ? ? ?BP Readings from Last 3 Encounters:  ?07/10/21 120/80  ?05/09/21 110/80  ?03/20/21 120/70  ? ?Wt Readings from Last 3 Encounters:  ?07/10/21 102 lb 6.4 oz (46.4 kg)  ?05/09/21 111 lb (50.3 kg)  ?03/20/21 107 lb (48.5 kg)  ? ? ?Physical Exam ?Constitutional:   ?   General: She is not in acute distress. ?   Appearance: She is not diaphoretic.  ?Cardiovascular:  ?   Rate and Rhythm: Normal rate and regular rhythm.  ?   Heart sounds: Normal heart sounds.  ?Pulmonary:  ?   Effort: Pulmonary effort is normal.  ?   Breath sounds: Normal breath sounds.  ?Abdominal:  ?   General: Bowel sounds are normal. There is no distension.  ?   Palpations: Abdomen is soft.  ?   Tenderness: There is no abdominal tenderness.  ?Skin: ?   General: Skin is warm and dry.  ?Neurological:  ?   Mental Status: She is alert.  ? ? ? ?Assessment/Plan: Please see individual problem list. ? ?Problem List Items Addressed This Visit   ? ?  Dementia without behavioral disturbance (HCC) (Chronic)  ?  Patient certainly has dementia.  She had a difficult time repeating what I was advising her of today.  Her daughter reports having power of attorney and I have requested that she bring [place] a copy.  I discussed that the patient's dementia could be progressing and that could be contributing to her appetite issues and weight loss. ? ?  ?  ? Depression, major, single episode, moderate (HCC) (Chronic)  ?  Patient does continue to have some depression.  Discussed that this could be contributing to decreased appetite and weight loss.  Discussed trying the mirtazapine 7.5 mg once nightly to see if that would be beneficial for depression and increasing her appetite.  Her daughter noted that they would likely try this. ? ?  ?  ? Weight loss - Primary (Chronic)  ?  Patient's  weight has trended down again.  She has had continued intermittent abdominal pain with decreased appetite and weight loss.  I discussed my concern regarding this and that I recommended a CT abdomen and pelvis to evaluate for potential underlying cause.  Discussed that there could be a cancer or some other lesion that is contributing to her symptoms that could potentially be treated or at least guide possible treatment.  The patient had difficulty understanding this with her dementia though her daughter verbalized understanding of this.  The daughter noted that she would be on board with getting the CT scan though she noted she needed to discuss further with the patient to make sure she was okay with this.  We will get blood work today to look for possible causes of her weight loss.  I discussed the potential of having hospice or palliative care involved though the daughter expressed that she was unsure of what they would actually be able to do for the patient.  I discussed that if the patient continues to not eat well and does not have any work-up it would be worthwhile having palliative care involved to help with symptom management.  Discussed the risk of death without further work-up of this.  I encouraged high calorie food intake. ? ?  ?  ? Relevant Orders  ? Comp Met (CMET)  ? TSH  ? CBC w/Diff  ? Sedimentation rate  ? ? ?Return in about 2 weeks (around 07/24/2021) for Weight follow-up. ? ?This visit occurred during the SARS-CoV-2 public health emergency.  Safety protocols were in place, including screening questions prior to the visit, additional usage of staff PPE, and extensive cleaning of exam room while observing appropriate contact time as indicated for disinfecting solutions.  ? ? ?Tommi Rumps, MD ?[facility] ? ?

## 2021-07-10 NOTE — Assessment & Plan Note (Addendum)
Patient certainly has dementia.  She had a difficult time repeating what I was advising her of today.  Her daughter reports having power of attorney and I have requested that she bring [place] a copy.  I discussed that the patient's dementia could be progressing and that could be contributing to her appetite issues and weight loss. ?

## 2021-07-10 NOTE — Assessment & Plan Note (Addendum)
Patient does continue to have some depression.  Discussed that this could be contributing to decreased appetite and weight loss.  Discussed trying the mirtazapine 7.5 mg once nightly to see if that would be beneficial for depression and increasing her appetite.  Her daughter noted that they would likely try this. ?

## 2021-07-11 DIAGNOSIS — I10 Essential (primary) hypertension: Secondary | ICD-10-CM | POA: Diagnosis not present

## 2021-07-11 DIAGNOSIS — R809 Proteinuria, unspecified: Secondary | ICD-10-CM | POA: Diagnosis not present

## 2021-07-11 DIAGNOSIS — N1832 Chronic kidney disease, stage 3b: Secondary | ICD-10-CM | POA: Diagnosis not present

## 2021-07-16 NOTE — Progress Notes (Signed)
Cardiology Office Note ? ?Date:  07/17/2021  ? ?ID:  Dawn Cooper, DOB 06/23/1918, MRN [id number] ? ?PCP:  Leone Haven, MD  ? ?Chief Complaint  ?Patient presents with  ? 12 month follow up   ?  Patient c/o not having an appetite and is not eating. Medications reviewed by the patient verbally.   ? ? ?HPI:  ?[name] is a very pleasant 88 -year-old woman with  ?severe aortic valve stenosis,    ?atrial fibrillation (not on anticoagulation secondary to GI bleed) ,  ?GI bleed from AVM,  ?stroke June 2016, documented on MRI,  ?who presents for follow-up of her aortic valve stenosis , chronic atrial fibrillation, hx of STEMI ? ?LOV 12/2019 ?Rapid weight loss over the past year, previously 130 pounds now weighing 104 pounds ?Family who presents with her today reports that she is in bed more, not sleeping at night ? ?Balance worrse, spends more time in bed and chair ? ?No SOB, denies chest pain ?Drinks a boost x 3 a day ?Off lasix x 4 days secondary to worsening renal dysfunction ? ?Prescribed Remeron, does not want to take it  ? ?Stays cold ?Family reports she is losing her hair, losing muscle ? leg weakness ? ?Some confusion today on Who I am ? ?EKG personally reviewed by myself on todays visit ?Atrial fibrillation rate 76 bpm left bundle branch block ? ?Other past medical history reviewed ?06/2019, low blood pressure at that time ?Metoprolol held  ?Family reports that she did not fell well off metoprolol ?They restarted metoprolol 1/2 dose once a day, reports that she feels better ?Low blood pressure April 2021 also low blood pressure October 05, 2019 ? ?Echo 10/2017 ?- Left ventricle: The cavity size was normal. There was mild ?  concentric hypertrophy. Systolic function was severely reduced. ?  The estimated ejection fraction was in the range of 25% to 30%. ?  Hypokinesis of the anterior myocardium. Hypokinesis of the ?  anteroseptal myocardium. Hypokinesis of the apical myocardium. ?  The study is not technically  sufficient to allow evaluation of LV ?  diastolic function. ?- Aortic valve: There was severe stenosis. There was mild ?  regurgitation. Peak velocity (S): 417 cm/s. Mean gradient (S): 40 ?  mm Hg. Valve area (VTI): 0.35 cm^2. ?- Mitral valve: There was mild to moderate regurgitation. ?- Left atrium: The atrium was severely dilated. ? ?Previous echocardiogram May 2018 with moderate to severe aortic valve stenosis ? ?STEMI 08/09/2016 ?Ost 1st Diag lesion, 70 %stenosed. ?A STENT SYNERGY DES 2.5X16 drug eluting stent was successfully placed. ?Prox LAD lesion, 100 %stenosed. ?Post intervention, there is a 0% residual stenosis. ?  ?1. Acute anterolateral STEMI secondary to thrombotic occlusion of known severe mid LAD and diagonal stenosis.  Successful PTCA/DES x 1 mid LAD ? ?PMH:   has a past medical history of A-fib ([place]), Broken leg, Cardiomyopathy, Chronic kidney disease, Common bile duct stone, Duodenal ulceration, Gastrointestinal hemorrhage with hematemesis, GERD (gastroesophageal reflux disease), Heart murmur, Hiatal hernia, Hypertension, Mild mitral regurgitation by prior echocardiogram, Moderate aortic stenosis, Myocardial infarction [facility]), Stroke ([place]), and Tricuspid regurgitation. ? ?PSH:    ?Past Surgical History:  ?Procedure Laterality Date  ? APPENDECTOMY    ? CATARACT EXTRACTION  2013  ? left  ? CHOLECYSTECTOMY    ? CORONARY ANGIOGRAPHY N/A 08/09/2016  ? Procedure: Coronary Angiography;  Surgeon: Burnell Blanks, MD;  Location: [facility];  Service: Cardiovascular;  Laterality: N/A;  ? CORONARY ANGIOPLASTY    ?  CORONARY ARTERY BYPASS GRAFT    ? CORONARY STENT INTERVENTION N/A 08/09/2016  ? Procedure: Coronary Stent Intervention;  Surgeon: Burnell Blanks, MD;  Location: [facility];  Service: Cardiovascular;  Laterality: N/A;  ? ERCP N/A 10/07/2017  ? Procedure: ENDOSCOPIC RETROGRADE CHOLANGIOPANCREATOGRAPHY (ERCP);  Surgeon: Lucilla Lame, MD;  Location: [facility] ENDOSCOPY;  Service:  Endoscopy;  Laterality: N/A;  ? ESOPHAGOGASTRODUODENOSCOPY (EGD) WITH PROPOFOL N/A 10/08/2017  ? Procedure: ESOPHAGOGASTRODUODENOSCOPY (EGD) WITH PROPOFOL;  Surgeon: Lucilla Lame, MD;  Location: [facility] ENDOSCOPY;  Service: Endoscopy;  Laterality: N/A;  ? EYE SURGERY    ? FEMUR FRACTURE SURGERY  12/2011  ? Dr. Sabra Heck, s/p rehab at [facility]  ? FRACTURE SURGERY    ? GALLBLADDER SURGERY    ? IR RADIOLOGY PERIPHERAL GUIDED IV START  07/29/2016  ? IR US GUIDE VASC ACCESS RIGHT  07/29/2016  ? VAGINAL DELIVERY    ? ? ?Current Outpatient Medications  ?Medication Sig Dispense Refill  ? clopidogrel (PLAVIX) 75 MG tablet Take 1 tablet (75 mg total) by mouth daily. 90 tablet 0  ? cyanocobalamin (,VITAMIN B-12,) 1000 MCG/ML injection     ? furosemide (LASIX) 20 MG tablet Take 1 tablet (20 mg total) by mouth every other day. 45 tablet 0  ? metoprolol tartrate (LOPRESSOR) 25 MG tablet TAKE 1/2 TABLET (12.5 MG) IN THE MORNING WITH EXTRA 1/2 TABLET (12.5 MG) AS NEEDED IN THE EVENING 90 tablet 3  ? nitroGLYCERIN (NITROSTAT) 0.4 MG SL tablet Place 1 tablet (0.4 mg total) under the tongue every 5 (five) minutes x 3 doses as needed for chest pain. 25 tablet 3  ? pantoprazole (PROTONIX) 40 MG tablet TAKE 1 TABLET EVERY DAY 90 tablet 1  ? potassium chloride (KLOR-CON) 10 MEQ tablet TAKE 1 TABLET EVERY OTHER DAY 45 tablet 3  ? triamcinolone cream (KENALOG) 0.1 % Apply 1 application topically 2 (two) times daily. 30 g 0  ? mirtazapine (REMERON) 7.5 MG tablet Take 1 tablet (7.5 mg total) by mouth at bedtime. (Patient not taking: Reported on 07/17/2021) 90 tablet 1  ? ?No current facility-administered medications for this visit.  ? ? ?Allergies:   Dabigatran, Dabigatran etexilate mesylate, Clonidine, Clonidine derivatives, and Penicillins  ? ?Social History:  The patient  reports that she has never smoked. She has never used smokeless tobacco. She reports that she does not currently use alcohol. She reports that she does not use drugs.  ? ?Family  History:   family history includes Cancer in her brother and sister; Diabetes in her brother and another family member; Stroke in her sister.  ? ? ?Review of Systems: ?Review of Systems  ?Constitutional: Negative.   ?HENT: Negative.    ?Respiratory: Negative.    ?Cardiovascular: Negative.   ?Gastrointestinal: Negative.   ?Musculoskeletal: Negative.   ?     Leg weakness, gait instability  ?Neurological: Negative.   ?Psychiatric/Behavioral:  The patient has insomnia.   ?All other systems reviewed and are negative. ? ?PHYSICAL EXAM: ?VS:  BP (!) 80/68 (BP Location: Left Arm, Patient Position: Sitting, Cuff Size: Normal)   Pulse 76   Ht '4\' 11"'$  (1.499 m)   Wt 104 lb 2 oz (47.2 kg)   SpO2 98%   BMI 21.03 kg/m?  , BMI Body mass index is 21.03 kg/m?Marland Kitchen ?Constitutional:  oriented to person, place, and time. No distress.  Thin ,in a wheelchair ?HENT:  ?Head: Grossly normal ?Eyes:  no discharge. No scleral icterus.  ?Neck: No JVD, no carotid bruits  ?Cardiovascular:  Regular rate and rhythm, 3/6 systolic ejection murmur appreciated right sternal border  ?Pulmonary/Chest: Clear to auscultation bilaterally, no wheezes or rales ?Abdominal: Soft.  no distension.  no tenderness.  ?Musculoskeletal: Normal range of motion ?Neurological:  normal muscle tone. Coordination normal. No atrophy ?Skin: Skin warm and dry ?Psychiatric: normal affect, pleasant ? ?Recent Labs: ?07/10/2021: ALT 12; BUN 50; Creatinine, Ser 1.38; Hemoglobin 12.8; Platelets 139.0; Potassium 4.5; Sodium 138; TSH 2.42  ? ? ?Lipid Panel ?Lab Results  ?Component Value Date  ? CHOL 154 08/09/2016  ? HDL 33 (L) 08/09/2016  ? LDLCALC 102 (H) 08/09/2016  ? TRIG 94 08/09/2016  ? ? ?Wt Readings from Last 3 Encounters:  ?07/17/21 104 lb 2 oz (47.2 kg)  ?07/10/21 102 lb 6.4 oz (46.4 kg)  ?05/09/21 111 lb (50.3 kg)  ?  ? ?ASSESSMENT AND PLAN: ? ?Chronic atrial fibrillation (HCC) -  ? history of strokes.   ?No recent symptoms of TIA or new stroke ?Previously declined warfarin  or other more aggressive anticoagulation for atrial fibrillation. Hx of GI bleed ?High fall risk and given her age, not on anticoagulation ?We will stop Lasix given hypotension, poor intake ? ?Cerebrovascular

## 2021-07-17 ENCOUNTER — Encounter: Payer: Self-pay | Admitting: Cardiovascular Disease

## 2021-07-17 ENCOUNTER — Ambulatory Visit (INDEPENDENT_AMBULATORY_CARE_PROVIDER_SITE_OTHER): Payer: Medicare Other | Admitting: Cardiovascular Disease

## 2021-07-17 VITALS — BP 80/68 | HR 76 | Ht 59.0 in | Wt 104.1 lb

## 2021-07-17 DIAGNOSIS — R609 Edema, unspecified: Secondary | ICD-10-CM | POA: Diagnosis not present

## 2021-07-17 DIAGNOSIS — I4811 Longstanding persistent atrial fibrillation: Secondary | ICD-10-CM | POA: Diagnosis not present

## 2021-07-17 DIAGNOSIS — I25118 Atherosclerotic heart disease of native coronary artery with other forms of angina pectoris: Secondary | ICD-10-CM | POA: Diagnosis not present

## 2021-07-17 DIAGNOSIS — I350 Nonrheumatic aortic (valve) stenosis: Secondary | ICD-10-CM

## 2021-07-17 MED ORDER — FUROSEMIDE 20 MG PO TABS: ORAL_TABLET | ORAL | 0 refills | Status: AC

## 2021-07-17 NOTE — Patient Instructions (Addendum)
Medication Instructions:  ?- Your physician has recommended you make the following change in your medication:  ? ?1) CHANGE lasix (furosemide) 20 mg: ?- take 1 tablet (20 mg) by mouth once daily ONLY AS NEEDED for ankle swelling ? ? ?Increase fluid intake ? ?If you need a refill on your cardiac medications before your next appointment, please call your pharmacy.  ? ? ?Lab work: ?No new labs needed ? ? ?Testing/Procedures: ?No new testing needed ? ? ?Follow-Up: ?At [facility], you and your health needs are our priority.  As part of our continuing mission to provide you with exceptional heart care, we have created designated Provider Care Teams.  These Care Teams include your primary Cardiologist (physician) and Advanced Practice Providers (APPs -  Physician Assistants and Nurse Practitioners) who all work together to provide you with the care you need, when you need it. ? ?You will need a follow up appointment in 6 months ? ?Providers on your designated Care Team:   ?Murray Hodgkins, NP ?Christell Faith, PA-C ?Cadence Kathlen Mody, PA-C ? ?COVID-19 Vaccine Information can be found at: [email or web address].uk For questions related to vaccine distribution or appointments, please email [email or web address] or call [phone].  ? ?

## 2021-07-24 ENCOUNTER — Ambulatory Visit: Payer: Medicare Other

## 2021-07-27 ENCOUNTER — Encounter: Payer: Self-pay | Admitting: Family Medicine

## 2021-07-27 ENCOUNTER — Ambulatory Visit (INDEPENDENT_AMBULATORY_CARE_PROVIDER_SITE_OTHER): Payer: Medicare Other | Admitting: Family Medicine

## 2021-07-27 DIAGNOSIS — E538 Deficiency of other specified B group vitamins: Secondary | ICD-10-CM | POA: Diagnosis not present

## 2021-07-27 DIAGNOSIS — F321 Major depressive disorder, single episode, moderate: Secondary | ICD-10-CM | POA: Diagnosis not present

## 2021-07-27 DIAGNOSIS — R1012 Left upper quadrant pain: Secondary | ICD-10-CM

## 2021-07-27 DIAGNOSIS — I25118 Atherosclerotic heart disease of native coronary artery with other forms of angina pectoris: Secondary | ICD-10-CM

## 2021-07-27 DIAGNOSIS — R634 Abnormal weight loss: Secondary | ICD-10-CM

## 2021-07-27 DIAGNOSIS — R109 Unspecified abdominal pain: Secondary | ICD-10-CM | POA: Insufficient documentation

## 2021-07-27 MED ORDER — CYANOCOBALAMIN 1000 MCG/ML IJ SOLN
1000.0000 ug | Freq: Once | INTRAMUSCULAR | Status: AC
  Administered 2021-07-27: 1000 ug via INTRAMUSCULAR

## 2021-07-27 NOTE — Progress Notes (Signed)
?Dawn Rumps, MD ?Phone: [phone] ? ?Dawn Cooper is a 88 y.o. female who presents today for f/u. ? ?Weight loss/abdominal pain: The patient continues to intermittently get some abdominal discomfort.  She has poor appetite though her daughter reports she has been eating somewhat more food than previously.  She is having 3 boost drinks per day.  She is also eating pudding and eggs.  No nausea, vomiting, or diarrhea. ? ?Depression: Patient reports occasionally feeling depressed.  They note she took the Remeron 1 time and did not want to take it anymore.  No SI. ? ?Social History  ? ?Tobacco Use  ?Smoking Status Never  ?Smokeless Tobacco Never  ? ? ?Current Outpatient Medications on File Prior to Visit  ?Medication Sig Dispense Refill  ? clopidogrel (PLAVIX) 75 MG tablet Take 1 tablet (75 mg total) by mouth daily. 90 tablet 0  ? cyanocobalamin (,VITAMIN B-12,) 1000 MCG/ML injection     ? furosemide (LASIX) 20 MG tablet Take 1 tablet (20 mg) by mouth once daily as needed for ankle swelling 45 tablet 0  ? metoprolol tartrate (LOPRESSOR) 25 MG tablet TAKE 1/2 TABLET (12.5 MG) IN THE MORNING WITH EXTRA 1/2 TABLET (12.5 MG) AS NEEDED IN THE EVENING 90 tablet 3  ? mirtazapine (REMERON) 7.5 MG tablet Take 1 tablet (7.5 mg total) by mouth at bedtime. 90 tablet 1  ? nitroGLYCERIN (NITROSTAT) 0.4 MG SL tablet Place 1 tablet (0.4 mg total) under the tongue every 5 (five) minutes x 3 doses as needed for chest pain. 25 tablet 3  ? pantoprazole (PROTONIX) 40 MG tablet TAKE 1 TABLET EVERY DAY 90 tablet 1  ? potassium chloride (KLOR-CON) 10 MEQ tablet TAKE 1 TABLET EVERY OTHER DAY 45 tablet 3  ? triamcinolone cream (KENALOG) 0.1 % Apply 1 application topically 2 (two) times daily. 30 g 0  ? ?No current facility-administered medications on file prior to visit.  ? ? ? ?ROS see history of present illness ? ?Objective ? ?Physical Exam ?Vitals:  ? 07/27/21 1400  ?BP: 100/60  ?Pulse: (!) 51  ?Temp: 98.2 ?F (36.8 ?C)  ?SpO2: 97%   ? ? ?BP Readings from Last 3 Encounters:  ?07/27/21 100/60  ?07/17/21 (!) 80/68  ?07/10/21 120/80  ? ?Wt Readings from Last 3 Encounters:  ?07/27/21 105 lb 6.4 oz (47.8 kg)  ?07/17/21 104 lb 2 oz (47.2 kg)  ?07/10/21 102 lb 6.4 oz (46.4 kg)  ? ? ?Physical Exam ?Constitutional:   ?   General: She is not in acute distress. ?   Appearance: She is not diaphoretic.  ?Cardiovascular:  ?   Rate and Rhythm: Normal rate and regular rhythm.  ?   Heart sounds: Normal heart sounds.  ?Pulmonary:  ?   Effort: Pulmonary effort is normal.  ?Abdominal:  ?   General: Bowel sounds are normal. There is no distension.  ?   Palpations: Abdomen is soft.  ?   Tenderness: There is abdominal tenderness (Slight left upper quadrant discomfort on palpation).  ?Skin: ?   General: Skin is warm and dry.  ?Neurological:  ?   Mental Status: She is alert.  ? ? ? ?Assessment/Plan: Please see individual problem list. ? ?Problem List Items Addressed This Visit   ? ? Depression, major, single episode, moderate (HCC) (Chronic)  ?  I encouraged the patient to take the mirtazapine 7.5 mg daily.  Discussed it may help with the depression and her appetite. ? ?  ?  ? Weight loss (Chronic)  ?  Related to poor appetite and food intake.  That may be related to her dementia or depression or some intra-abdominal process.  I encouraged her to continue drinking 3 boost drinks per day.  She will try to eat high-calorie foods.  We will get a CT abdomen and pelvis to evaluate her abdominal pain. ? ?  ?  ? Relevant Orders  ? CT Abdomen Pelvis W Contrast  ? Abdominal pain  ?  Concerning for an intra-abdominal process contributing to her weight loss and poor appetite.  We will get a CT abdomen and pelvis to evaluate further. ? ?  ?  ? Relevant Orders  ? CT Abdomen Pelvis W Contrast  ? B12 deficiency  ?  B12 injection today. ? ?  ?  ? ? ?Return in about 4 weeks (around 08/24/2021) for Weight follow-up. ? ? ?Dawn Rumps, MD ?[facility] ? ?

## 2021-07-27 NOTE — Assessment & Plan Note (Signed)
Related to poor appetite and food intake.  That may be related to her dementia or depression or some intra-abdominal process.  I encouraged her to continue drinking 3 boost drinks per day.  She will try to eat high-calorie foods.  We will get a CT abdomen and pelvis to evaluate her abdominal pain. ?

## 2021-07-27 NOTE — Assessment & Plan Note (Signed)
B12 injection today 

## 2021-07-27 NOTE — Patient Instructions (Signed)
Nice to see you. ?I would encourage you to take the mirtazapine to see if that will help with your appetite and depression. ?Somebody should contact you to get the CT scan scheduled to evaluate for a cause of your abdominal pain. ?

## 2021-07-27 NOTE — Assessment & Plan Note (Signed)
Concerning for an intra-abdominal process contributing to her weight loss and poor appetite.  We will get a CT abdomen and pelvis to evaluate further. ?

## 2021-07-27 NOTE — Addendum Note (Signed)
Addended by: [name] D on: 07/27/2021 02:48 PM ? ? Modules accepted: Orders ? ?

## 2021-07-27 NOTE — Assessment & Plan Note (Addendum)
I encouraged the patient to take the mirtazapine 7.5 mg daily.  Discussed it may help with the depression and her appetite. ?

## 2021-08-01 ENCOUNTER — Ambulatory Visit: Admission: RE | Admit: 2021-08-01 | Payer: Medicare Other | Source: Ambulatory Visit

## 2021-08-01 ENCOUNTER — Telehealth: Payer: Self-pay

## 2021-08-01 NOTE — Telephone Encounter (Signed)
Patient's daughter, Darlin Priestly, called to say she had to cancel the patient's CT scan scheduled for today, because the patient refused to go. ?

## 2021-08-06 ENCOUNTER — Telehealth: Payer: Self-pay | Admitting: Family Medicine

## 2021-08-06 NOTE — Telephone Encounter (Signed)
Attempted to schedule AWV. Unable to LVM.  Will try at later time.  

## 2021-08-13 ENCOUNTER — Telehealth: Payer: Self-pay | Admitting: Family Medicine

## 2021-08-13 DIAGNOSIS — F0390 Unspecified dementia without behavioral disturbance: Secondary | ICD-10-CM

## 2021-08-13 NOTE — Telephone Encounter (Signed)
I called and spoke with the patients sister and she stated the patient is weak and cannot stand and she refuses to eat.  She stated they need some assistance.  Therman Hughlett,cma

## 2021-08-13 NOTE — Telephone Encounter (Signed)
Referral placed.

## 2021-08-13 NOTE — Telephone Encounter (Signed)
Is there a particular reason they are now requesting hospice care?  I know I discussed this with them previously though I want to see if anything has changed.  Thanks.

## 2021-08-13 NOTE — Telephone Encounter (Signed)
Pt daughter called in stating pt need hospice care now and they need a prescription for B12 sent to mail order

## 2021-08-14 ENCOUNTER — Telehealth: Payer: Self-pay | Admitting: Family Medicine

## 2021-08-14 DIAGNOSIS — I129 Hypertensive chronic kidney disease with stage 1 through stage 4 chronic kidney disease, or unspecified chronic kidney disease: Secondary | ICD-10-CM | POA: Diagnosis not present

## 2021-08-14 DIAGNOSIS — K8020 Calculus of gallbladder without cholecystitis without obstruction: Secondary | ICD-10-CM | POA: Diagnosis not present

## 2021-08-14 DIAGNOSIS — N189 Chronic kidney disease, unspecified: Secondary | ICD-10-CM | POA: Diagnosis not present

## 2021-08-14 DIAGNOSIS — R634 Abnormal weight loss: Secondary | ICD-10-CM | POA: Diagnosis not present

## 2021-08-14 DIAGNOSIS — Z6821 Body mass index (BMI) 21.0-21.9, adult: Secondary | ICD-10-CM | POA: Diagnosis not present

## 2021-08-14 DIAGNOSIS — I083 Combined rheumatic disorders of mitral, aortic and tricuspid valves: Secondary | ICD-10-CM | POA: Diagnosis not present

## 2021-08-14 DIAGNOSIS — G4700 Insomnia, unspecified: Secondary | ICD-10-CM | POA: Diagnosis not present

## 2021-08-14 DIAGNOSIS — E538 Deficiency of other specified B group vitamins: Secondary | ICD-10-CM | POA: Diagnosis not present

## 2021-08-14 DIAGNOSIS — K219 Gastro-esophageal reflux disease without esophagitis: Secondary | ICD-10-CM | POA: Diagnosis not present

## 2021-08-14 DIAGNOSIS — I4891 Unspecified atrial fibrillation: Secondary | ICD-10-CM | POA: Diagnosis not present

## 2021-08-14 DIAGNOSIS — I25119 Atherosclerotic heart disease of native coronary artery with unspecified angina pectoris: Secondary | ICD-10-CM | POA: Diagnosis not present

## 2021-08-14 DIAGNOSIS — E785 Hyperlipidemia, unspecified: Secondary | ICD-10-CM | POA: Diagnosis not present

## 2021-08-14 DIAGNOSIS — L309 Dermatitis, unspecified: Secondary | ICD-10-CM | POA: Diagnosis not present

## 2021-08-14 DIAGNOSIS — I429 Cardiomyopathy, unspecified: Secondary | ICD-10-CM | POA: Diagnosis not present

## 2021-08-14 DIAGNOSIS — R627 Adult failure to thrive: Secondary | ICD-10-CM | POA: Diagnosis not present

## 2021-08-14 DIAGNOSIS — I69318 Other symptoms and signs involving cognitive functions following cerebral infarction: Secondary | ICD-10-CM | POA: Diagnosis not present

## 2021-08-14 NOTE — Telephone Encounter (Signed)
I called and spoke with Dawn Cooper from [facility] and informed her that the provider would be the patients attending physician as long as the hospice physician manages all comfort measures and she stated no problem.  Tylek Boney,cma

## 2021-08-14 NOTE — Telephone Encounter (Signed)
That is fine as long as the hospice physician manages all comfort measures.

## 2021-08-14 NOTE — Telephone Encounter (Signed)
Jasmine from [facility], [phone]. Would like to know if Dr Caryl Bis would be patient's attending physician for patient while in hospice care.

## 2021-08-15 DIAGNOSIS — R634 Abnormal weight loss: Secondary | ICD-10-CM | POA: Diagnosis not present

## 2021-08-15 DIAGNOSIS — I4891 Unspecified atrial fibrillation: Secondary | ICD-10-CM | POA: Diagnosis not present

## 2021-08-15 DIAGNOSIS — Z6821 Body mass index (BMI) 21.0-21.9, adult: Secondary | ICD-10-CM | POA: Diagnosis not present

## 2021-08-15 DIAGNOSIS — I429 Cardiomyopathy, unspecified: Secondary | ICD-10-CM | POA: Diagnosis not present

## 2021-08-15 DIAGNOSIS — I69318 Other symptoms and signs involving cognitive functions following cerebral infarction: Secondary | ICD-10-CM | POA: Diagnosis not present

## 2021-08-15 DIAGNOSIS — I083 Combined rheumatic disorders of mitral, aortic and tricuspid valves: Secondary | ICD-10-CM | POA: Diagnosis not present

## 2021-08-22 ENCOUNTER — Telehealth: Payer: Self-pay | Admitting: Family Medicine

## 2021-08-22 NOTE — Telephone Encounter (Signed)
I have completed her death certificate.

## 2021-08-22 NOTE — Telephone Encounter (Signed)
I called and left a messageg that the provider already completed the death certificate.  Kanon Novosel,cma

## 2021-08-22 NOTE — Telephone Encounter (Signed)
Dawn Cooper from [employer] [facility] [employer], [phone]. They would like Dr Caryl Bis to sign death certificate, patient passed away on 5/24/223.

## 2021-08-23 NOTE — Telephone Encounter (Signed)
Vivien Rota called from [facility] to state she is notifying Dr. Caryl Bis that patient died today at 11:20am.

## 2021-08-23 DEATH — deceased

## 2021-08-24 ENCOUNTER — Ambulatory Visit: Payer: Medicare Other | Admitting: Family Medicine

## 2021-08-28 ENCOUNTER — Ambulatory Visit: Payer: Medicare Other
